# Patient Record
Sex: Female | Born: 1950
Health system: Southern US, Community
[De-identification: ages and names within clinical notes are randomized; demographics above are authoritative.]

## PROBLEM LIST (undated history)

## (undated) DIAGNOSIS — K5730 Diverticulosis of large intestine without perforation or abscess without bleeding: Secondary | ICD-10-CM

## (undated) DIAGNOSIS — B070 Plantar wart: Secondary | ICD-10-CM

## (undated) DIAGNOSIS — E785 Hyperlipidemia, unspecified: Secondary | ICD-10-CM

## (undated) DIAGNOSIS — Z9289 Personal history of other medical treatment: Secondary | ICD-10-CM

## (undated) DIAGNOSIS — R920 Mammographic microcalcification found on diagnostic imaging of breast: Secondary | ICD-10-CM

## (undated) DIAGNOSIS — G8929 Other chronic pain: Secondary | ICD-10-CM

## (undated) DIAGNOSIS — T8579XA Infection and inflammatory reaction due to other internal prosthetic devices, implants and grafts, initial encounter: Secondary | ICD-10-CM

## (undated) DIAGNOSIS — Z860100 Personal history of colon polyps, unspecified: Secondary | ICD-10-CM

## (undated) DIAGNOSIS — M545 Low back pain, unspecified: Secondary | ICD-10-CM

## (undated) DIAGNOSIS — I10 Essential (primary) hypertension: Secondary | ICD-10-CM

## (undated) DIAGNOSIS — H539 Unspecified visual disturbance: Secondary | ICD-10-CM

## (undated) DIAGNOSIS — R209 Unspecified disturbances of skin sensation: Secondary | ICD-10-CM

## (undated) DIAGNOSIS — M1990 Unspecified osteoarthritis, unspecified site: Secondary | ICD-10-CM

## (undated) DIAGNOSIS — K222 Esophageal obstruction: Secondary | ICD-10-CM

## (undated) DIAGNOSIS — K219 Gastro-esophageal reflux disease without esophagitis: Secondary | ICD-10-CM

## (undated) DIAGNOSIS — R1310 Dysphagia, unspecified: Secondary | ICD-10-CM

## (undated) DIAGNOSIS — N6019 Diffuse cystic mastopathy of unspecified breast: Secondary | ICD-10-CM

## (undated) DIAGNOSIS — C439 Malignant melanoma of skin, unspecified: Secondary | ICD-10-CM

## (undated) DIAGNOSIS — Z87440 Personal history of urinary (tract) infections: Secondary | ICD-10-CM

## (undated) DIAGNOSIS — M8580 Other specified disorders of bone density and structure, unspecified site: Secondary | ICD-10-CM

## (undated) DIAGNOSIS — T7840XA Allergy, unspecified, initial encounter: Secondary | ICD-10-CM

## (undated) DIAGNOSIS — M25512 Pain in left shoulder: Secondary | ICD-10-CM

## (undated) DIAGNOSIS — Z5189 Encounter for other specified aftercare: Secondary | ICD-10-CM

## (undated) DIAGNOSIS — H269 Unspecified cataract: Secondary | ICD-10-CM

## (undated) DIAGNOSIS — O321XX0 Maternal care for breech presentation, not applicable or unspecified: Secondary | ICD-10-CM

## (undated) HISTORY — DX: Infection and inflammatory reaction due to other internal prosthetic devices, implants and grafts, initial encounter: T85.79XA

## (undated) HISTORY — DX: Personal history of urinary (tract) infections: Z87.440

## (undated) HISTORY — DX: Personal history of other medical treatment: Z92.89

## (undated) HISTORY — DX: Personal history of colon polyps, unspecified: Z86.0100

## (undated) HISTORY — DX: Other chronic pain: G89.29

## (undated) HISTORY — DX: Essential (primary) hypertension: I10

## (undated) HISTORY — DX: Unspecified disturbances of skin sensation: R20.9

## (undated) HISTORY — DX: Mammographic microcalcification found on diagnostic imaging of breast: R92.0

## (undated) HISTORY — DX: Esophageal obstruction: K22.2

## (undated) HISTORY — DX: Diverticulosis of large intestine without perforation or abscess without bleeding: K57.30

## (undated) HISTORY — DX: Hyperlipidemia, unspecified: E78.5

## (undated) HISTORY — DX: Unspecified osteoarthritis, unspecified site: M19.90

## (undated) HISTORY — DX: Other specified disorders of bone density and structure, unspecified site: M85.80

## (undated) HISTORY — DX: Gastro-esophageal reflux disease without esophagitis: K21.9

## (undated) HISTORY — DX: Unspecified visual disturbance: H53.9

## (undated) HISTORY — DX: Low back pain, unspecified: M54.50

## (undated) HISTORY — DX: Allergy, unspecified, initial encounter: T78.40XA

## (undated) HISTORY — DX: Pain in left shoulder: M25.512

## (undated) HISTORY — DX: Dysphagia, unspecified: R13.10

## (undated) HISTORY — DX: Low back pain: M54.5

## (undated) HISTORY — DX: Plantar wart: B07.0

## (undated) HISTORY — PX: POLYPECTOMY: SHX149

## (undated) HISTORY — DX: Encounter for other specified aftercare: Z51.89

## (undated) HISTORY — PX: UPPER GASTROINTESTINAL ENDOSCOPY: SHX188

## (undated) HISTORY — DX: Diffuse cystic mastopathy of unspecified breast: N60.19

## (undated) HISTORY — DX: Personal history of colonic polyps: Z86.010

---
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
## 1993-02-09 DIAGNOSIS — I10 Essential (primary) hypertension: Secondary | ICD-10-CM

## 1993-02-09 HISTORY — DX: Essential (primary) hypertension: I10

## 1995-04-21 ENCOUNTER — Encounter: Payer: Self-pay | Admitting: Family Medicine

## 1995-04-21 LAB — CONVERTED CEMR LAB

## 1996-06-26 ENCOUNTER — Encounter: Payer: Self-pay | Admitting: Family Medicine

## 1996-06-26 LAB — CONVERTED CEMR LAB
RBC count: 4.63 10*6/uL
TSH: 0.48 microintl units/mL
WBC, blood: 7.1 10*3/uL

## 1997-05-31 ENCOUNTER — Encounter: Payer: Self-pay | Admitting: Family Medicine

## 1997-05-31 LAB — CONVERTED CEMR LAB
Blood Glucose, Fasting: 95 mg/dL
Hgb A1c MFr Bld: 6.3 %
TSH: 0.97 microintl units/mL

## 1997-09-27 ENCOUNTER — Encounter: Payer: Self-pay | Admitting: Family Medicine

## 1997-09-27 LAB — CONVERTED CEMR LAB: Hgb A1c MFr Bld: 5.8 %

## 1998-09-11 ENCOUNTER — Encounter: Payer: Self-pay | Admitting: Family Medicine

## 1998-09-11 LAB — CONVERTED CEMR LAB: Hgb A1c MFr Bld: 5.2 %

## 1998-12-17 ENCOUNTER — Encounter: Admission: RE | Admit: 1998-12-17 | Discharge: 1998-12-17 | Payer: Self-pay | Admitting: *Deleted

## 1998-12-31 ENCOUNTER — Encounter: Admission: RE | Admit: 1998-12-31 | Discharge: 1998-12-31 | Payer: Self-pay | Admitting: *Deleted

## 1999-08-20 ENCOUNTER — Encounter: Payer: Self-pay | Admitting: Family Medicine

## 1999-08-20 LAB — CONVERTED CEMR LAB: Hgb A1c MFr Bld: 6.5 %

## 1999-08-22 ENCOUNTER — Encounter: Payer: Self-pay | Admitting: Family Medicine

## 1999-12-31 ENCOUNTER — Encounter: Payer: Self-pay | Admitting: Family Medicine

## 1999-12-31 LAB — CONVERTED CEMR LAB

## 2000-01-13 ENCOUNTER — Encounter: Admission: RE | Admit: 2000-01-13 | Discharge: 2000-01-13 | Payer: Self-pay | Admitting: *Deleted

## 2000-01-16 ENCOUNTER — Encounter: Payer: Self-pay | Admitting: Family Medicine

## 2000-01-29 ENCOUNTER — Encounter: Payer: Self-pay | Admitting: Family Medicine

## 2000-10-12 ENCOUNTER — Encounter: Payer: Self-pay | Admitting: Family Medicine

## 2000-12-31 ENCOUNTER — Encounter: Payer: Self-pay | Admitting: Family Medicine

## 2000-12-31 LAB — CONVERTED CEMR LAB
Blood Glucose, Fasting: 111 mg/dL
TSH: 0.81 microintl units/mL

## 2001-01-24 ENCOUNTER — Encounter: Admission: RE | Admit: 2001-01-24 | Discharge: 2001-01-24 | Payer: Self-pay | Admitting: *Deleted

## 2001-03-30 ENCOUNTER — Encounter: Payer: Self-pay | Admitting: Family Medicine

## 2001-03-30 LAB — CONVERTED CEMR LAB: Hgb A1c MFr Bld: 5.4 %

## 2001-08-19 ENCOUNTER — Encounter: Payer: Self-pay | Admitting: Family Medicine

## 2001-08-19 LAB — CONVERTED CEMR LAB: Hgb A1c MFr Bld: 5.5 %

## 2001-08-22 ENCOUNTER — Encounter: Payer: Self-pay | Admitting: Family Medicine

## 2001-08-22 LAB — CONVERTED CEMR LAB: Blood Glucose, Fasting: 99 mg/dL

## 2002-01-20 ENCOUNTER — Encounter: Payer: Self-pay | Admitting: Family Medicine

## 2002-01-20 LAB — CONVERTED CEMR LAB

## 2002-03-20 ENCOUNTER — Encounter: Payer: Self-pay | Admitting: Family Medicine

## 2002-08-29 ENCOUNTER — Encounter: Payer: Self-pay | Admitting: Family Medicine

## 2002-08-30 ENCOUNTER — Encounter: Payer: Self-pay | Admitting: Family Medicine

## 2002-08-30 LAB — CONVERTED CEMR LAB: Blood Glucose, Fasting: 116 mg/dL

## 2003-04-10 DIAGNOSIS — Z9289 Personal history of other medical treatment: Secondary | ICD-10-CM

## 2003-04-10 HISTORY — DX: Personal history of other medical treatment: Z92.89

## 2003-10-16 ENCOUNTER — Encounter: Payer: Self-pay | Admitting: Family Medicine

## 2003-10-16 LAB — CONVERTED CEMR LAB
Blood Glucose, Fasting: 120 mg/dL
TSH: 0.71 microintl units/mL

## 2004-04-11 ENCOUNTER — Ambulatory Visit: Payer: Self-pay | Admitting: Family Medicine

## 2004-04-11 LAB — CONVERTED CEMR LAB: Microalbumin U total vol: 1.7 mg/L

## 2004-04-16 ENCOUNTER — Ambulatory Visit: Payer: Self-pay | Admitting: Family Medicine

## 2004-05-30 ENCOUNTER — Ambulatory Visit: Payer: Self-pay | Admitting: Unknown Physician Specialty

## 2004-10-07 ENCOUNTER — Ambulatory Visit: Payer: Self-pay | Admitting: Family Medicine

## 2004-10-14 ENCOUNTER — Ambulatory Visit: Payer: Self-pay | Admitting: Family Medicine

## 2004-10-22 ENCOUNTER — Ambulatory Visit: Payer: Self-pay | Admitting: Family Medicine

## 2004-10-22 LAB — CONVERTED CEMR LAB
Blood Glucose, Fasting: 115 mg/dL
Hgb A1c MFr Bld: 5.6 %
Microalbumin U total vol: 3.2 mg/L
TSH: 1.16 u[IU]/mL

## 2004-10-29 ENCOUNTER — Ambulatory Visit: Payer: Self-pay | Admitting: Family Medicine

## 2004-12-10 ENCOUNTER — Ambulatory Visit: Payer: Self-pay | Admitting: Family Medicine

## 2004-12-23 ENCOUNTER — Ambulatory Visit: Payer: Self-pay | Admitting: Family Medicine

## 2005-01-23 ENCOUNTER — Ambulatory Visit: Payer: Self-pay | Admitting: Family Medicine

## 2005-01-27 ENCOUNTER — Ambulatory Visit: Payer: Self-pay | Admitting: Family Medicine

## 2005-05-27 ENCOUNTER — Ambulatory Visit: Payer: Self-pay | Admitting: Family Medicine

## 2005-05-27 LAB — CONVERTED CEMR LAB: Hgb A1c MFr Bld: 6 %

## 2005-05-29 ENCOUNTER — Ambulatory Visit: Payer: Self-pay | Admitting: Family Medicine

## 2005-06-01 ENCOUNTER — Ambulatory Visit: Payer: Self-pay | Admitting: Unknown Physician Specialty

## 2005-11-24 ENCOUNTER — Ambulatory Visit: Payer: Self-pay | Admitting: Family Medicine

## 2005-11-24 LAB — CONVERTED CEMR LAB
Blood Glucose, Fasting: 110 mg/dL
Microalbumin U total vol: 8.2 mg/L

## 2005-11-26 ENCOUNTER — Ambulatory Visit: Payer: Self-pay | Admitting: Family Medicine

## 2005-12-21 ENCOUNTER — Ambulatory Visit: Payer: Self-pay | Admitting: Family Medicine

## 2005-12-25 ENCOUNTER — Ambulatory Visit: Payer: Self-pay | Admitting: Family Medicine

## 2006-06-01 ENCOUNTER — Encounter: Payer: Self-pay | Admitting: Family Medicine

## 2006-06-01 ENCOUNTER — Encounter: Payer: Self-pay | Admitting: Gastroenterology

## 2006-06-01 ENCOUNTER — Ambulatory Visit: Payer: Self-pay | Admitting: Gastroenterology

## 2006-06-03 ENCOUNTER — Ambulatory Visit: Payer: Self-pay | Admitting: Unknown Physician Specialty

## 2006-06-05 ENCOUNTER — Encounter: Payer: Self-pay | Admitting: Family Medicine

## 2006-10-26 ENCOUNTER — Encounter: Payer: Self-pay | Admitting: Family Medicine

## 2006-10-26 ENCOUNTER — Ambulatory Visit: Payer: Self-pay | Admitting: Family Medicine

## 2006-10-26 DIAGNOSIS — M25519 Pain in unspecified shoulder: Secondary | ICD-10-CM | POA: Insufficient documentation

## 2006-10-27 ENCOUNTER — Telehealth: Payer: Self-pay | Admitting: Family Medicine

## 2006-10-28 ENCOUNTER — Telehealth: Payer: Self-pay | Admitting: Family Medicine

## 2007-03-14 ENCOUNTER — Encounter: Payer: Self-pay | Admitting: Family Medicine

## 2007-05-02 ENCOUNTER — Ambulatory Visit: Payer: Self-pay | Admitting: Family Medicine

## 2007-05-02 LAB — CONVERTED CEMR LAB
ALT: 21 units/L (ref 0–35)
AST: 18 units/L (ref 0–37)
Albumin: 3.8 g/dL (ref 3.5–5.2)
Alkaline Phosphatase: 57 units/L (ref 39–117)
BUN: 11 mg/dL (ref 6–23)
Bilirubin, Direct: 0.1 mg/dL (ref 0.0–0.3)
CO2: 31 meq/L (ref 19–32)
Calcium: 9.4 mg/dL (ref 8.4–10.5)
Chloride: 104 meq/L (ref 96–112)
Cholesterol: 204 mg/dL (ref 0–200)
Creatinine, Ser: 0.6 mg/dL (ref 0.4–1.2)
Creatinine,U: 96 mg/dL
Direct LDL: 119.9 mg/dL
GFR calc Af Amer: 133 mL/min
GFR calc non Af Amer: 110 mL/min
Glucose, Bld: 108 mg/dL — ABNORMAL HIGH (ref 70–99)
HDL: 34.7 mg/dL — ABNORMAL LOW (ref >39.0)
Hgb A1c MFr Bld: 6.2 % — ABNORMAL HIGH (ref 4.6–6.0)
Microalb Creat Ratio: 16.7 mg/g (ref 0.0–30.0)
Microalb, Ur: 1.6 mg/dL (ref 0.0–1.9)
Potassium: 3.6 meq/L (ref 3.5–5.1)
Sodium: 143 meq/L (ref 135–145)
TSH: 0.85 microintl units/mL (ref 0.35–5.50)
Total Bilirubin: 0.6 mg/dL (ref 0.3–1.2)
Total CHOL/HDL Ratio: 5.9
Total Protein: 6.5 g/dL (ref 6.0–8.3)
Triglycerides: 305 mg/dL (ref 0–149)
VLDL: 61 mg/dL — ABNORMAL HIGH (ref 0–40)

## 2007-05-05 ENCOUNTER — Ambulatory Visit: Payer: Self-pay | Admitting: Family Medicine

## 2007-05-05 DIAGNOSIS — G8929 Other chronic pain: Secondary | ICD-10-CM | POA: Insufficient documentation

## 2007-05-05 DIAGNOSIS — M545 Low back pain, unspecified: Secondary | ICD-10-CM | POA: Insufficient documentation

## 2007-05-05 DIAGNOSIS — K5730 Diverticulosis of large intestine without perforation or abscess without bleeding: Secondary | ICD-10-CM | POA: Insufficient documentation

## 2007-06-07 ENCOUNTER — Ambulatory Visit: Payer: Self-pay | Admitting: Unknown Physician Specialty

## 2007-06-08 ENCOUNTER — Encounter (INDEPENDENT_AMBULATORY_CARE_PROVIDER_SITE_OTHER): Payer: Self-pay | Admitting: *Deleted

## 2007-06-08 ENCOUNTER — Ambulatory Visit: Payer: Self-pay | Admitting: Family Medicine

## 2007-06-08 LAB — CONVERTED CEMR LAB
OCCULT 1: NEGATIVE
OCCULT 2: NEGATIVE
OCCULT 3: NEGATIVE

## 2007-06-08 LAB — FECAL OCCULT BLOOD, GUAIAC: Fecal Occult Blood: NEGATIVE

## 2007-06-13 ENCOUNTER — Encounter: Payer: Self-pay | Admitting: Family Medicine

## 2007-06-13 ENCOUNTER — Ambulatory Visit: Payer: Self-pay | Admitting: Unknown Physician Specialty

## 2007-06-13 IMAGING — US ULTRASOUND RIGHT BREAST
1 series · 10 of 10 positions shown · non-contrast
Comparison: none

REASON FOR EXAM: Right breast nodular density
COMMENTS:

[Series 1: ultrasound right breast · 10 of 10 slices shown]
[im 1/10]
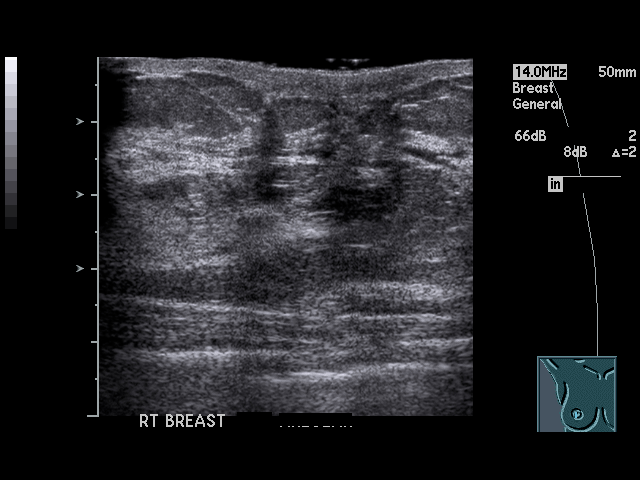
[im 2/10]
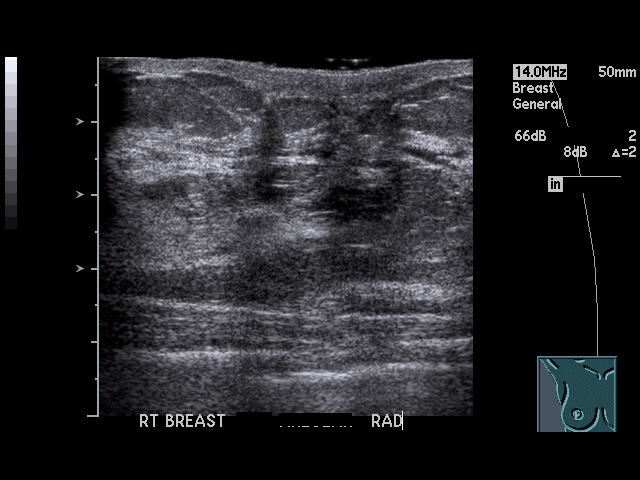
[im 3/10]
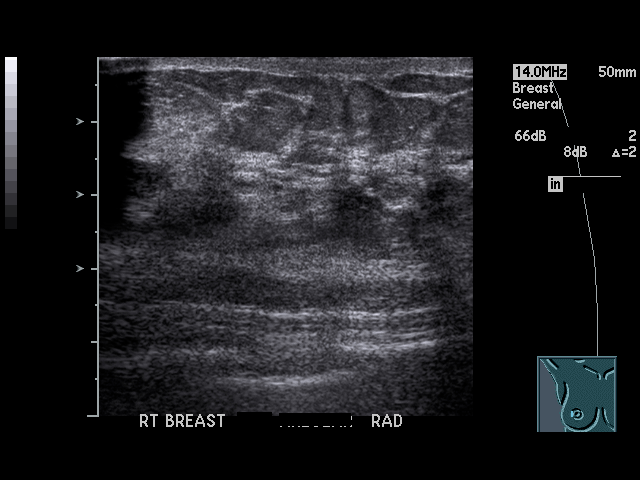
[im 4/10]
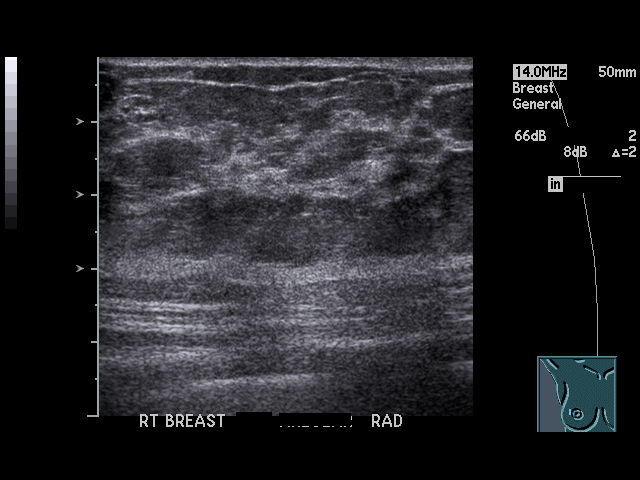
[im 5/10]
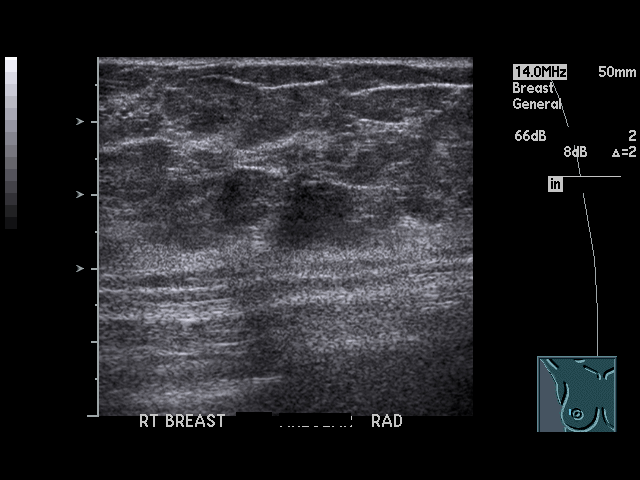
[im 6/10]
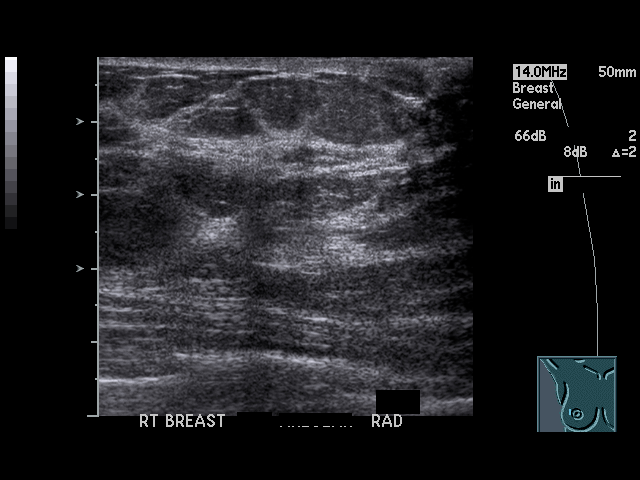
[im 7/10]
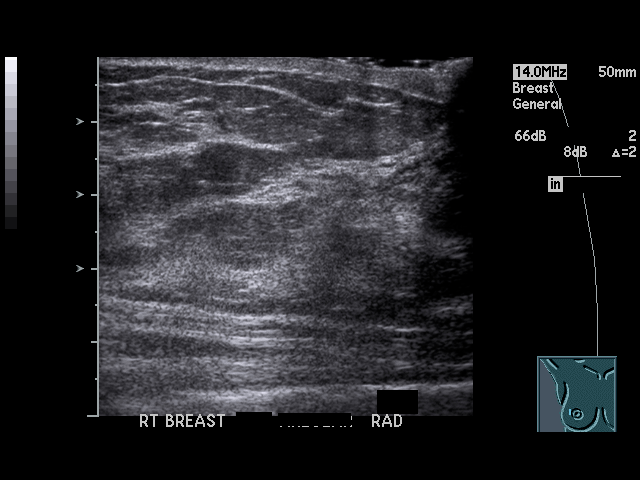
[im 8/10]
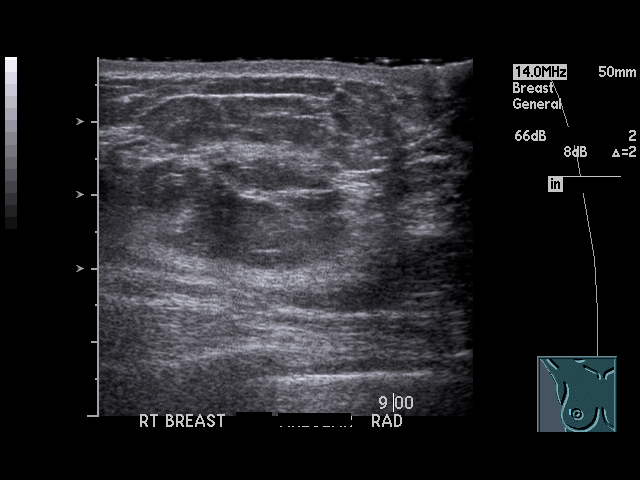
[im 9/10]
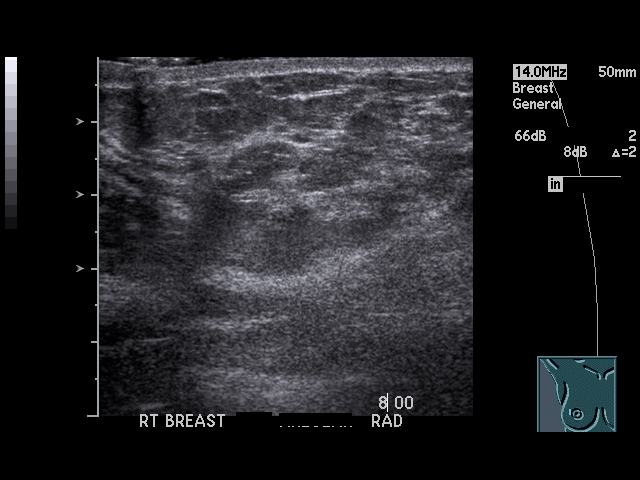
[im 10/10]
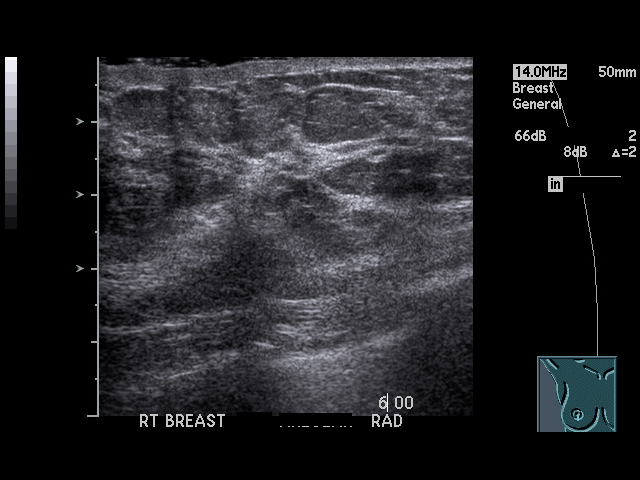

[10 of 10 positions shown; findings below may reference images not displayed]

PROCEDURE:     US  - US BREAST RIGHT  - [DATE] [DATE]

RESULT:     The RIGHT breast was evaluated in the region of interest along
the periareolar region. The breast was evaluated from the 12 o'clock to the
6 o'clock position in the periareolar region. No solid or cystic sonographic
abnormalities are appreciated.
IMPRESSION: No sonographic abnormalities are appreciated. Please refer
to the additional radiographic view dictation for completed discussion.

## 2007-10-19 ENCOUNTER — Ambulatory Visit: Payer: Self-pay | Admitting: Family Medicine

## 2007-10-19 LAB — CONVERTED CEMR LAB: Hgb A1c MFr Bld: 6.1 % — ABNORMAL HIGH (ref 4.6–6.0)

## 2007-10-25 ENCOUNTER — Ambulatory Visit: Payer: Self-pay | Admitting: Family Medicine

## 2007-10-26 ENCOUNTER — Encounter: Payer: Self-pay | Admitting: Family Medicine

## 2007-10-27 DIAGNOSIS — I10 Essential (primary) hypertension: Secondary | ICD-10-CM | POA: Insufficient documentation

## 2007-10-27 DIAGNOSIS — IMO0002 Reserved for concepts with insufficient information to code with codable children: Secondary | ICD-10-CM | POA: Insufficient documentation

## 2007-10-27 DIAGNOSIS — E1169 Type 2 diabetes mellitus with other specified complication: Secondary | ICD-10-CM | POA: Insufficient documentation

## 2007-10-27 DIAGNOSIS — E1165 Type 2 diabetes mellitus with hyperglycemia: Secondary | ICD-10-CM

## 2008-02-10 HISTORY — PX: COLONOSCOPY: SHX174

## 2008-02-10 HISTORY — PX: ESOPHAGOGASTRODUODENOSCOPY: SHX1529

## 2008-03-15 ENCOUNTER — Encounter: Payer: Self-pay | Admitting: Family Medicine

## 2008-05-02 ENCOUNTER — Ambulatory Visit: Payer: Self-pay | Admitting: Family Medicine

## 2008-05-02 LAB — CONVERTED CEMR LAB
Albumin: 3.9 g/dL (ref 3.5–5.2)
Bilirubin, Direct: 0 mg/dL (ref 0.0–0.3)
CO2: 33 meq/L — ABNORMAL HIGH (ref 19–32)
Calcium: 9.2 mg/dL (ref 8.4–10.5)
Creatinine, Ser: 0.8 mg/dL (ref 0.4–1.2)
Direct LDL: 108.1 mg/dL
GFR calc non Af Amer: 78.22 mL/min (ref >60)
Glucose, Bld: 119 mg/dL — ABNORMAL HIGH (ref 70–99)
HDL: 37.4 mg/dL — ABNORMAL LOW (ref >39.00)
Hgb A1c MFr Bld: 6.3 % (ref 4.6–6.5)
Total Protein: 6.7 g/dL (ref 6.0–8.3)
Triglycerides: 186 mg/dL — ABNORMAL HIGH (ref 0.0–149.0)

## 2008-05-09 ENCOUNTER — Ambulatory Visit: Payer: Self-pay | Admitting: Family Medicine

## 2008-05-09 DIAGNOSIS — R1310 Dysphagia, unspecified: Secondary | ICD-10-CM | POA: Insufficient documentation

## 2008-05-23 ENCOUNTER — Ambulatory Visit: Payer: Self-pay | Admitting: Family Medicine

## 2008-05-23 LAB — CONVERTED CEMR LAB
OCCULT 1: POSITIVE
OCCULT 2: POSITIVE

## 2008-06-08 ENCOUNTER — Ambulatory Visit: Payer: Self-pay | Admitting: Family Medicine

## 2008-06-20 ENCOUNTER — Ambulatory Visit: Payer: Self-pay | Admitting: Unknown Physician Specialty

## 2008-06-20 ENCOUNTER — Encounter: Payer: Self-pay | Admitting: Family Medicine

## 2008-07-10 ENCOUNTER — Ambulatory Visit: Payer: Self-pay | Admitting: Gastroenterology

## 2008-07-10 DIAGNOSIS — K6289 Other specified diseases of anus and rectum: Secondary | ICD-10-CM | POA: Insufficient documentation

## 2008-07-12 ENCOUNTER — Telehealth: Payer: Self-pay | Admitting: Gastroenterology

## 2008-07-23 ENCOUNTER — Ambulatory Visit: Payer: Self-pay | Admitting: Gastroenterology

## 2008-07-23 ENCOUNTER — Encounter: Payer: Self-pay | Admitting: Gastroenterology

## 2008-07-23 DIAGNOSIS — K222 Esophageal obstruction: Secondary | ICD-10-CM

## 2008-07-23 HISTORY — DX: Esophageal obstruction: K22.2

## 2008-07-25 ENCOUNTER — Encounter: Payer: Self-pay | Admitting: Gastroenterology

## 2008-07-30 ENCOUNTER — Telehealth: Payer: Self-pay | Admitting: Gastroenterology

## 2008-08-06 ENCOUNTER — Ambulatory Visit: Payer: Self-pay | Admitting: Family Medicine

## 2008-08-06 LAB — CONVERTED CEMR LAB
BUN: 14 mg/dL (ref 6–23)
CO2: 31 meq/L (ref 19–32)
Chloride: 106 meq/L (ref 96–112)
Creatinine, Ser: 0.6 mg/dL (ref 0.4–1.2)
Glucose, Bld: 119 mg/dL — ABNORMAL HIGH (ref 70–99)

## 2008-08-09 ENCOUNTER — Ambulatory Visit: Payer: Self-pay | Admitting: Family Medicine

## 2008-08-09 DIAGNOSIS — Z860100 Personal history of colon polyps, unspecified: Secondary | ICD-10-CM | POA: Insufficient documentation

## 2008-08-09 DIAGNOSIS — K222 Esophageal obstruction: Secondary | ICD-10-CM | POA: Insufficient documentation

## 2008-08-14 ENCOUNTER — Ambulatory Visit: Payer: Self-pay | Admitting: Gastroenterology

## 2008-08-15 ENCOUNTER — Telehealth: Payer: Self-pay | Admitting: Gastroenterology

## 2008-09-25 ENCOUNTER — Ambulatory Visit: Payer: Self-pay | Admitting: Family Medicine

## 2008-10-04 ENCOUNTER — Ambulatory Visit: Payer: Self-pay | Admitting: Family Medicine

## 2008-11-01 ENCOUNTER — Encounter: Payer: Self-pay | Admitting: Family Medicine

## 2008-11-05 ENCOUNTER — Telehealth: Payer: Self-pay | Admitting: Family Medicine

## 2008-11-09 ENCOUNTER — Ambulatory Visit: Payer: Self-pay | Admitting: Family Medicine

## 2008-11-11 LAB — CONVERTED CEMR LAB
ALT: 23 units/L (ref 0–35)
HDL: 35.6 mg/dL — ABNORMAL LOW (ref >39.00)
Total CHOL/HDL Ratio: 4
VLDL: 29 mg/dL (ref 0.0–40.0)

## 2008-11-22 ENCOUNTER — Ambulatory Visit: Payer: Self-pay | Admitting: Family Medicine

## 2008-11-22 DIAGNOSIS — F329 Major depressive disorder, single episode, unspecified: Secondary | ICD-10-CM | POA: Insufficient documentation

## 2008-11-22 HISTORY — DX: Major depressive disorder, single episode, unspecified: F32.9

## 2008-11-30 ENCOUNTER — Ambulatory Visit: Payer: Self-pay | Admitting: Family Medicine

## 2008-12-31 ENCOUNTER — Ambulatory Visit: Payer: Self-pay | Admitting: Family Medicine

## 2009-02-09 DIAGNOSIS — Z87440 Personal history of urinary (tract) infections: Secondary | ICD-10-CM

## 2009-02-09 HISTORY — DX: Personal history of urinary (tract) infections: Z87.440

## 2009-04-04 ENCOUNTER — Encounter: Payer: Self-pay | Admitting: Family Medicine

## 2009-04-05 ENCOUNTER — Encounter: Payer: Self-pay | Admitting: Family Medicine

## 2009-04-05 LAB — HM DIABETES EYE EXAM: HM Diabetic Eye Exam: NORMAL

## 2009-05-09 ENCOUNTER — Ambulatory Visit: Payer: Self-pay | Admitting: Family Medicine

## 2009-05-09 LAB — CONVERTED CEMR LAB
ALT: 19 units/L (ref 0–35)
AST: 15 units/L (ref 0–37)
Albumin: 3.9 g/dL (ref 3.5–5.2)
Alkaline Phosphatase: 56 units/L (ref 39–117)
Basophils Relative: 0 % (ref 0.0–3.0)
CO2: 31 meq/L (ref 19–32)
Calcium: 9.1 mg/dL (ref 8.4–10.5)
Chloride: 105 meq/L (ref 96–112)
Eosinophils Absolute: 0.2 10*3/uL (ref 0.0–0.7)
Eosinophils Relative: 3.7 % (ref 0.0–5.0)
HDL: 40.4 mg/dL (ref >39.00)
Hemoglobin: 12.7 g/dL (ref 12.0–15.0)
Lymphocytes Relative: 29.6 % (ref 12.0–46.0)
MCHC: 34.5 g/dL (ref 30.0–36.0)
Monocytes Relative: 12.5 % — ABNORMAL HIGH (ref 3.0–12.0)
Neutro Abs: 2.7 10*3/uL (ref 1.4–7.7)
RBC: 4.28 M/uL (ref 3.87–5.11)
Sodium: 143 meq/L (ref 135–145)
Total CHOL/HDL Ratio: 4
Total Protein: 7.1 g/dL (ref 6.0–8.3)

## 2009-05-14 ENCOUNTER — Ambulatory Visit: Payer: Self-pay | Admitting: Family Medicine

## 2009-07-03 ENCOUNTER — Ambulatory Visit: Payer: Self-pay | Admitting: Unknown Physician Specialty

## 2009-07-03 LAB — HM MAMMOGRAPHY: HM Mammogram: NORMAL

## 2009-07-05 ENCOUNTER — Encounter: Payer: Self-pay | Admitting: Family Medicine

## 2009-07-23 ENCOUNTER — Ambulatory Visit: Payer: Self-pay | Admitting: Family Medicine

## 2009-09-03 ENCOUNTER — Encounter: Payer: Self-pay | Admitting: Family Medicine

## 2009-09-06 ENCOUNTER — Ambulatory Visit: Payer: Self-pay | Admitting: Family Medicine

## 2009-09-06 DIAGNOSIS — H6980 Other specified disorders of Eustachian tube, unspecified ear: Secondary | ICD-10-CM | POA: Insufficient documentation

## 2009-09-17 ENCOUNTER — Encounter (INDEPENDENT_AMBULATORY_CARE_PROVIDER_SITE_OTHER): Payer: Self-pay | Admitting: *Deleted

## 2009-11-29 ENCOUNTER — Ambulatory Visit: Payer: Self-pay | Admitting: Family Medicine

## 2009-12-01 LAB — CONVERTED CEMR LAB
ALT: 19 units/L (ref 0–35)
AST: 16 units/L (ref 0–37)
BUN: 14 mg/dL (ref 6–23)
Basophils Absolute: 0.1 10*3/uL (ref 0.0–0.1)
Bilirubin, Direct: 0.1 mg/dL (ref 0.0–0.3)
Cholesterol: 179 mg/dL (ref 0–200)
Creatinine, Ser: 0.7 mg/dL (ref 0.4–1.2)
Creatinine,U: 68 mg/dL
Eosinophils Relative: 3.6 % (ref 0.0–5.0)
GFR calc non Af Amer: 97.13 mL/min (ref >60)
Glucose, Bld: 126 mg/dL — ABNORMAL HIGH (ref 70–99)
HDL: 38.9 mg/dL — ABNORMAL LOW (ref >39.00)
Hgb A1c MFr Bld: 6.5 % (ref 4.6–6.5)
LDL Cholesterol: 112 mg/dL — ABNORMAL HIGH (ref 0–99)
Microalb Creat Ratio: 2.1 mg/g (ref 0.0–30.0)
Monocytes Absolute: 0.5 10*3/uL (ref 0.1–1.0)
Monocytes Relative: 8.3 % (ref 3.0–12.0)
Neutrophils Relative %: 57.2 % (ref 43.0–77.0)
Platelets: 160 10*3/uL (ref 150.0–400.0)
RDW: 13.4 % (ref 11.5–14.6)
TSH: 0.51 microintl units/mL (ref 0.35–5.50)
Total Bilirubin: 0.7 mg/dL (ref 0.3–1.2)
Triglycerides: 140 mg/dL (ref 0.0–149.0)
VLDL: 28 mg/dL (ref 0.0–40.0)
WBC: 5.5 10*3/uL (ref 4.5–10.5)

## 2009-12-04 ENCOUNTER — Ambulatory Visit: Payer: Self-pay | Admitting: Family Medicine

## 2010-01-09 ENCOUNTER — Encounter: Payer: Self-pay | Admitting: Family Medicine

## 2010-02-09 HISTORY — PX: MELANOMA EXCISION: SHX5266

## 2010-02-09 HISTORY — PX: EYE SURGERY: SHX253

## 2010-03-05 ENCOUNTER — Ambulatory Visit
Admission: RE | Admit: 2010-03-05 | Discharge: 2010-03-05 | Payer: Self-pay | Source: Home / Self Care | Attending: Family Medicine | Admitting: Family Medicine

## 2010-03-13 NOTE — Assessment & Plan Note (Signed)
Summary: CPX / LFW   Vital Signs:  Patient profile:   60 year old female Weight:      178.75 pounds Temp:     98.3 degrees F oral Pulse rate:   72 / minute Pulse rhythm:   regular BP sitting:   140 / 90  (left arm) Cuff size:   regular  Vitals Entered By: Sydell Axon LPN (May 14, 1608 1:49 PM) CC: 30 Minute checkup, had a colonoscopy 06/10 by Dr. Jarold Motto, sees Dr. Harold Hedge for her GYN care   History of Present Illness: Pt here for Comp Exam. She saw Dr Nile Riggs last Fall and did ok.  On Mar 22 had abd tenderness and soreness, no lose stools or congestion or fever. She has history of diverticulosis. This was probably a viral syndrome.   Preventive Screening-Counseling & Management  Alcohol-Tobacco     Alcohol drinks/day: 0     Alcohol type: wine occas     Smoking Status: never     Passive Smoke Exposure: no  Caffeine-Diet-Exercise     Caffeine use/day:   occas diet coke and tea     Does Patient Exercise: yes     Type of exercise: treadmill and abd crunches, shoulder exs.     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  (ICD-692.6) 3)  Depression  (ICD-311) 4)  Sprain and Strain of R Jaw  (ICD-848.1) 5)  Sprain and Strain of Right Little Finger Dip Extensor Tendon  (ICD-842.10) 6)  Lipoma  (ICD-214.9) 7)  Schatzki's Ring  (ICD-530.3) 8)  Colonic Polyps, Hyperplastic, Hx of  (ICD-V12.72) 9)  Rectal Bleeding From Tear Via Colonosc (DR PATERSON)  (ICD-569.3) 10)  Rectal Pain  (ICD-569.42) 11)  Hemoccult Positive Stool  (ICD-578.1) 12)  Dysphagia Unspecified  (ICD-787.20) 13)  Special Screening Malig Neoplasms Other Sites  (ICD-V76.49) 14)  Diverticulosis of Colon  (ICD-562.10) 15)  Health Maintenance Exam  (ICD-V70.0) 16)  Low Back Pain, Chronic  (ICD-724.2) 17)  Diabetes Mellitus  (ICD-250.00) 18)  Hyperlipidemia/ Hypertrigliceridemia   (ICD-272.4) 19)  Essential Hypertension, Benign  (ICD-401.1) 20)  Symptom, Disturbance of Skin Sensation  (ICD-782.0) 21)  Shoulder Pain, Left  (ICD-719.41)  Medications Prior to Update: 1)  Enalapril Maleate 20 Mg Tabs (Enalapril Maleate) .Marland Kitchen.. 1 By Mouth Two Times A Day 2)  Metformin Hcl 1000 Mg  Tabs (Metformin Hcl) .... 1/2 By Mouth Every Morning 1 By Mouth Each Evening 3)  Glimepiride 4 Mg  Tabs (Glimepiride) .... 1/2 Tab By Mouth Once Daily 4)  Cyclobenzaprine Hcl 10 Mg  Tabs (Cyclobenzaprine Hcl) .... As Needed For Muscle Pain 1 By Mouth Every 6 Hours 5)  Macrodantin 100 Mg Caps (Nitrofurantoin Macrocrystal) .Marland Kitchen.. 1 Tablet After Sex 6)  Hydrochlorothiazide 12.5 Mg Tabs (Hydrochlorothiazide) .Marland Kitchen.. 1 Tab By Mouth Every Am 7)  Amlodipine Besylate 5 Mg Tabs (Amlodipine Besylate) .... One Tab By Mouth At Night 8)  Anamantle Hc 3-0.5 % Kit (Lidocaine-Hydrocortisone Ace) .... Use Rectally Two Times A Day As Needed 9)  Pravachol 20 Mg Tabs (Pravastatin Sodium) .... One Tab By Mouth At Night 10)  Fish Oil 1000 Mg Caps (Omega-3 Fatty Acids) .Marland Kitchen.. 1-2 Tablets By Mouth Once Daily 11)  Kapidex 60 Mg  Cpdr (Dexlansoprazole) .... Take One By Mouth As Needed 12)  Fluoxetine Hcl (Pmdd) 10 Mg Caps (Fluoxetine Hcl (Pmdd)) .... One A  Day 13)  Lidex 0.05 % Crea (Fluocinonide) .... Apply To Area Three Times A Day As Needed  Allergies: No Known Drug Allergies  Past History:  Past Medical History: Last updated: 08/09/2008 Current Problems:  LIPOMA (ICD-214.9) SCHATZKI'S RING (ICD-530.3) COLONIC POLYPS, HYPERPLASTIC, HX OF (ICD-V12.72) RECTAL BLEEDING (ICD-569.3) RECTAL PAIN (ICD-569.42) HEMOCCULT POSITIVE STOOL (ICD-578.1) PLANTAR WART, R LITTLE TOE (ICD-078.12) DYSPHAGIA UNSPECIFIED (ICD-787.20) SPECIAL SCREENING MALIG NEOPLASMS OTHER SITES (ICD-V76.49) DIVERTICULOSIS OF COLON (ICD-562.10) HEALTH MAINTENANCE EXAM (ICD-V70.0) LOW BACK PAIN, CHRONIC (ICD-724.2) DIABETES MELLITUS  (ICD-250.00) OTHER AND UNSPECIFIED HYPERLIPIDEMIA (ICD-272.4) ESSENTIAL HYPERTENSION, BENIGN (ICD-401.1) SYMPTOM, DISTURBANCE OF SKIN SENSATION (ICD-782.0) SHOULDER PAIN, LEFT (ICD-719.41)  Past Surgical History: Last updated: 07/27/2008 C Section Breech Presentation 1982 HYSTOSONOGRAPH (DR. Lowella Bandy)- NORMAL :(01/2000) PELVIC ULTRASOUND    Uterus  13 X 7 X8cm  FIBROID 2.6cm :(04/2003) Colonoscopy polyps x 2  Divertics 06/01/2006 EGD Shatzki's Ring, dilated o/w nml (Dr Jarold Motto) 07/23/08 COlonoscopy Rectal Polyp Lipoma in Tr Colon o/w nml (Dr Jarold Motto) 07/23/08  Family History: Last updated: 05/14/2009 Father dec 70 DM Complcns:(KIDNEY FAILURE (DIAYLSIS) Mother A 81 DM Chol Htn: PERSONALITY DISORDER Brother A 48  DM HTN Hospitalized freq CAD, PTCA ; GOUT CV: + CAD GM HBP: +BROTHER; + MOTHER; + FATHER DM: + MOTHER; + FATHER; + SELF; + BROTHER, grandparents GOUT/ ARTHRITIS: + BROTHER PROSTATE CANCER: NEGATIVE OVARIAN /UTERINE CANCER: NEGATIVE COLON CANCER: NEGATIVE DEPRESSION:  ETOH/DRUG ABUSE NEGATIVE OTHER : pOSITIVE STROKE ; GM X2 :GF :  Family History of Breast Cancer: Aunts Family History of Kidney Disease: Father  Social History: Last updated: 07/10/2008 Occupation:[employer] Retired Married  1 Son Patient has never smoked.  Alcohol Use - yes-very occasional Daily Caffeine Use-3 drinks daily Illicit Drug Use - no Patient does not get regular exercise.   Risk Factors: Alcohol Use: 0 (05/14/2009) Caffeine Use:   occas diet coke and tea (05/14/2009) Exercise: yes (05/14/2009)  Risk Factors: Smoking Status: never (05/14/2009) Passive Smoke Exposure: no (05/14/2009)  Family History: Father dec 70 DM Complcns:(KIDNEY FAILURE (DIAYLSIS) Mother A 81 DM Chol Htn: PERSONALITY DISORDER Brother A 48  DM HTN Hospitalized freq CAD, PTCA ; GOUT CV: + CAD GM HBP: +BROTHER; + MOTHER; + FATHER DM: + MOTHER; + FATHER; + SELF; + BROTHER, grandparents GOUT/ ARTHRITIS: +  BROTHER PROSTATE CANCER: NEGATIVE OVARIAN /UTERINE CANCER: NEGATIVE COLON CANCER: NEGATIVE DEPRESSION:  ETOH/DRUG ABUSE NEGATIVE OTHER : pOSITIVE STROKE ; GM X2 :GF :  Family History of Breast Cancer: Aunts Family History of Kidney Disease: Father  Social History: Caffeine use/day:    occas diet coke and tea Does Patient Exercise:  yes  Review of Systems General:  Denies chills, fatigue, fever, sweats, weakness, and weight loss; occas night sweats. Eyes:  Denies blurring, discharge, and eye pain. ENT:  Complains of ringing in ears; denies decreased hearing and earache. CV:  Complains of palpitations; denies chest pain or discomfort, fainting, shortness of breath with exertion, swelling of feet, and swelling of hands; rare. Resp:  Denies cough, shortness of breath, and wheezing. GI:  Complains of constipation; denies diarrhea, excessive appetite, and gas; occas. GU:  Denies dysuria, nocturia, urinary frequency, and urinary hesitancy; Sensitive to glycerin. MS:  Complains of joint pain; denies low back pain, muscle aches, cramps, muscle weakness, and stiffness; left shoulder tightness. Derm:  Complains of dryness; denies itching and rash; on winter. Neuro:  Denies numbness, poor balance, tingling, and tremors.  Physical Exam  General:  Well-developed,well-nourished,in no acute distress; alert,appropriate and cooperative throughout examination Head:  Normocephalic  and atraumatic without obvious abnormalities. No apparent alopecia or balding. Sinuses NT. Eyes:  Conjunctiva clear bilaterally.  Ears:  External ear exam shows no significant lesions or deformities.  Otoscopic examination reveals clear canals, tympanic membranes are intact bilaterally without bulging, retraction, inflammation or discharge. Hearing is grossly normal bilaterally. Nose:  External nasal examination shows no deformity or inflammation. Nasal mucosa are pink and moist without lesions or exudates. Mouth:  Oral  mucosa and oropharynx without lesions or exudates.  Teeth in good repair. Neck:  No deformities, masses, or tenderness noted. Chest Wall:  No deformities, masses, or tenderness noted. Breasts:  Deferred to Dr Alfredo Batty Lungs:  Normal respiratory effort, chest expands symmetrically. Lungs are clear to auscultation, no crackles or wheezes. Heart:  Normal rate and regular rhythm. S1 and S2 normal without gallop, murmur, click, rub or other extra sounds. Abdomen:  Bowel sounds positive,abdomen soft and non-tender without masses, organomegaly or hernias noted. Rectal:  Deferred. Genitalia:  Deferred to Dr Alfredo Batty. Msk:  No deformity or scoliosis noted of thoracic or lumbar spine.  R shoulder slightly restricted ROM. Pulses:  R and L carotid,radial,femoral,dorsalis pedis and posterior tibial pulses are full and equal bilaterally Extremities:  No clubbing, cyanosis, edema, or deformity noted with normal full range of motion of all joints.   Neurologic:  No cranial nerve deficits noted. Station and gait are normal. Sensory, motor and coordinative functions appear intact. Skin:  Intact without suspicious lesions or rashes Cervical Nodes:  No lymphadenopathy noted Inguinal Nodes:  No significant adenopathy Psych:  Cognition and judgment appear intact. Alert and cooperative with normal attention span and concentration. No apparent delusions, illusions, hallucinations. Tearful with some mood lability.   Impression & Recommendations:  Problem # 1:  HEALTH MAINTENANCE EXAM (ICD-V70.0) Assessment Comment Only Discussed immun status, Gyn care and mammo recheck in May.  Discussed diet and exercise.  Problem # 2:  DEPRESSION (ICD-311) Assessment: Improved Stable. Does not feel she needs further meds and I agree. She is back to  her old self.  Problem # 3:  SPRAIN AND STRAIN OF RIGHT LITTLE FINGER DIP EXTENSOR TENDON (ICD-842.10) Assessment: Unchanged As good as it is going to get. Unable total  extemnsion but close.  Problem # 4:  SCHATZKI'S RING (ICD-530.3) Assessment: Unchanged Rarely has slow swallowing. Overall doing well.  Problem # 5:  RECTAL BLEEDING FROM TEAR VIA COLONOSC (DR PATERSON) (ICD-569.3) Assessment: Improved None recently.  Problem # 6:  DIVERTICULOSIS OF COLON (ICD-562.10) Assessment: Unchanged Discussed diet and need for regularity. RTC for prolonged LLQ discomfort.  Problem # 7:  DIABETES MELLITUS (ICD-250.00) Assessment: Unchanged Adequate. She wants better, will follow. Her updated medication list for this problem includes:    Enalapril Maleate 20 Mg Tabs (Enalapril maleate) .Marland Kitchen... 1 by mouth two times a day    Metformin Hcl 1000 Mg Tabs (Metformin hcl) .Marland Kitchen... 1/2 by mouth every morning 1 by mouth each evening    Glimepiride 4 Mg Tabs (Glimepiride) .Marland Kitchen... 1/2 tab by mouth once daily  Labs Reviewed: Creat: 0.7 (05/09/2009)   Microalbumin: 8.2 (11/24/2005)  Last Eye Exam: normal (04/05/2009) Reviewed HgBA1c results: 6.3 (05/09/2009)  6.3 (05/02/2008)  Problem # 8:  ESSENTIAL HYPERTENSION, BENIGN (ICD-401.1) Assessment: Unchanged Will follow, does not appear to be a trend. Her updated medication list for this problem includes:    Enalapril Maleate 20 Mg Tabs (Enalapril maleate) .Marland Kitchen... 1 by mouth two times a day    Hydrochlorothiazide 12.5 Mg Tabs (Hydrochlorothiazide) .Marland Kitchen... 1 tab by mouth  every am    Amlodipine Besylate 5 Mg Tabs (Amlodipine besylate) ..... One tab by mouth at night  BP today: 140/90 Prior BP: 124/80 (12/31/2008)  Labs Reviewed: K+: 3.9 (05/09/2009) Creat: : 0.7 (05/09/2009)   Chol: 157 (05/09/2009)   HDL: 40.40 (05/09/2009)   LDL: 80 (05/09/2009)   TG: 183.0 (05/09/2009)  Problem # 9:  HYPERLIPIDEMIA/ HYPERTRIGLICERIDEMIA (ICD-272.4) Assessment: Unchanged Good job, cont as is. Her updated medication list for this problem includes:    Pravachol 20 Mg Tabs (Pravastatin sodium) ..... One tab by mouth at night  Labs  Reviewed: SGOT: 15 (05/09/2009)   SGPT: 19 (05/09/2009)   HDL:40.40 (05/09/2009), 35.60 (11/09/2008)  LDL:80 (05/09/2009), 87 (11/09/2008)  Chol:157 (05/09/2009), 152 (11/09/2008)  Trig:183.0 (05/09/2009), 145.0 (11/09/2008)  Complete Medication List: 1)  Enalapril Maleate 20 Mg Tabs (Enalapril maleate) .Marland Kitchen.. 1 by mouth two times a day 2)  Metformin Hcl 1000 Mg Tabs (Metformin hcl) .... 1/2 by mouth every morning 1 by mouth each evening 3)  Glimepiride 4 Mg Tabs (Glimepiride) .... 1/2 tab by mouth once daily 4)  Cyclobenzaprine Hcl 10 Mg Tabs (Cyclobenzaprine hcl) .... As needed for muscle pain 1 by mouth every 6 hours 5)  Macrodantin 100 Mg Caps (Nitrofurantoin macrocrystal) .Marland Kitchen.. 1 tablet after sex 6)  Hydrochlorothiazide 12.5 Mg Tabs (Hydrochlorothiazide) .Marland Kitchen.. 1 tab by mouth every am 7)  Amlodipine Besylate 5 Mg Tabs (Amlodipine besylate) .... One tab by mouth at night 8)  Anamantle Hc 3-0.5 % Kit (Lidocaine-hydrocortisone ace) .... Use rectally two times a day as needed 9)  Pravachol 20 Mg Tabs (Pravastatin sodium) .... One tab by mouth at night 10)  Fish Oil 1000 Mg Caps (Omega-3 fatty acids) .Marland Kitchen.. 1-2 tablets by mouth once daily 11)  Kapidex 60 Mg Cpdr (Dexlansoprazole) .... Take one by mouth as needed 12)  Fluoxetine Hcl (pmdd) 10 Mg Caps (Fluoxetine hcl (pmdd)) .... One a day 13)  Lidex 0.05 % Crea (Fluocinonide) .... Apply to area three times a day as needed 14)  Centrum Silver Ultra Womens Tabs (Multiple vitamins-minerals) .... Take one by mouth daily 15)  B-100 Complex Tabs (Vitamins-lipotropics) .... Take one by mouth daily  Patient Instructions: 1)  RTC Jun for BP check Prescriptions: CYCLOBENZAPRINE HCL 10 MG  TABS (CYCLOBENZAPRINE HCL) as needed for muscle pain 1 by mouth every 6 hours  #30 x 2   Entered and Authorized by:   Shaune Leeks MD   Signed by:   Shaune Leeks MD on 05/14/2009   Method used:   Electronically to        [facility] [id number]  (retail)       [address]       [place], [place]  [address]       Ph: [phone] or [phone]       Fax: [phone]   RxID:   [id number] MACRODANTIN 100 MG CAPS (NITROFURANTOIN MACROCRYSTAL) 1 tablet after sex  #30 x 2   Entered and Authorized by:   Shaune Leeks MD   Signed by:   Shaune Leeks MD on 05/14/2009   Method used:   Electronically to        [facility] (retail)       [address]       [place], [place]  [address]       Ph: [phone] or [phone]       Fax: [phone]   RxID:   [id number] PRAVACHOL 20 MG TABS (  PRAVASTATIN SODIUM) one tab by mouth at night  #90 x 3   Entered by:   Sydell Axon LPN   Authorized by:   Shaune Leeks MD   Signed by:   Sydell Axon LPN on 16/11/9602   Method used:   Print then Give to Patient   RxID:   [id number] AMLODIPINE BESYLATE 5 MG TABS (AMLODIPINE BESYLATE) one tab by mouth at night  #90 x 3   Entered by:   Sydell Axon LPN   Authorized by:   Shaune Leeks MD   Signed by:   Sydell Axon LPN on 21/30/8657   Method used:   Print then Give to Patient   RxID:   [id number] GLIMEPIRIDE 4 MG  TABS (GLIMEPIRIDE) 1/2 tab by mouth once daily  #45 x 3   Entered by:   Sydell Axon LPN   Authorized by:   Shaune Leeks MD   Signed by:   Sydell Axon LPN on 02/11/7251   Method used:   Print then Give to Patient   RxID:   [id number] METFORMIN HCL 1000 MG  TABS (METFORMIN HCL) 1/2 by mouth every morning 1 by mouth each evening  #135 x 3   Entered by:   Sydell Axon LPN   Authorized by:   Shaune Leeks MD   Signed by:   Sydell Axon LPN on 75/64/3329   Method used:   Print then Give to Patient   RxID:   [id number] ENALAPRIL MALEATE 20 MG TABS (ENALAPRIL MALEATE) 1 by mouth two times a day  #180 x 3   Entered by:   Sydell Axon LPN   Authorized by:   Shaune Leeks MD   Signed by:   Sydell Axon LPN on 09/32/3557   Method used:   Print then Give to  Patient   RxID:   [id number]   Current Allergies (reviewed today): No known allergies

## 2010-03-13 NOTE — Assessment & Plan Note (Signed)
Summary: june follow up bp check/rbh   Vital Signs:  Patient profile:   60 year old female Weight:      176 pounds Temp:     98.6 degrees F oral Pulse rate:   80 / minute Pulse rhythm:   regular BP sitting:   140 / 98  (left arm) Cuff size:   regular  Vitals Entered By: Sydell Axon LPN (July 23, 2009 9:58 AM) CC: Follow-up on BP, has not been taking HCTZ, Preventive Care   History of Present Illness: Pt here for BP recheck. She checks her BP at home and it is always in the 120s/70s. She does not want to change/add medication. She feels well now after having fought off a viulent bug that had her down for three+ weeks. Her husband is now thinking he is coming down with the same bug.  Problems Prior to Update: 1)  Depression  (ICD-311) 2)  Sprain and Strain of Right Little Finger Dip Extensor Tendon  (ICD-842.10) 3)  Schatzki's Ring  (ICD-530.3) 4)  Colonic Polyps, Hyperplastic, Hx of  (ICD-V12.72) 5)  Rectal Bleeding From Tear Via Colonosc (DR PATERSON)  (ICD-569.3) 6)  Rectal Pain  (ICD-569.42) 7)  Dysphagia Unspecified  (ICD-787.20) 8)  Special Screening Malig Neoplasms Other Sites  (ICD-V76.49) 9)  Diverticulosis of Colon  (ICD-562.10) 10)  Health Maintenance Exam  (ICD-V70.0) 11)  Low Back Pain, Chronic  (ICD-724.2) 12)  Diabetes Mellitus  (ICD-250.00) 13)  Hyperlipidemia/ Hypertrigliceridemia  (ICD-272.4) 14)  Essential Hypertension, Benign  (ICD-401.1) 15)  Shoulder Pain, Left  (ICD-719.41)  Medications Prior to Update: 1)  Enalapril Maleate 20 Mg Tabs (Enalapril Maleate) .Marland Kitchen.. 1 By Mouth Two Times A Day 2)  Metformin Hcl 1000 Mg  Tabs (Metformin Hcl) .... 1/2 By Mouth Every Morning 1 By Mouth Each Evening 3)  Glimepiride 4 Mg  Tabs (Glimepiride) .... 1/2 Tab By Mouth Once Daily 4)  Cyclobenzaprine Hcl 10 Mg  Tabs (Cyclobenzaprine Hcl) .... As Needed For Muscle Pain 1 By Mouth Every 6 Hours 5)  Macrodantin 100 Mg Caps (Nitrofurantoin Macrocrystal) .Marland Kitchen.. 1 Tablet After  Sex 6)  Hydrochlorothiazide 12.5 Mg Tabs (Hydrochlorothiazide) .Marland Kitchen.. 1 Tab By Mouth Every Am 7)  Amlodipine Besylate 5 Mg Tabs (Amlodipine Besylate) .... One Tab By Mouth At Night 8)  Anamantle Hc 3-0.5 % Kit (Lidocaine-Hydrocortisone Ace) .... Use Rectally Two Times A Day As Needed 9)  Pravachol 20 Mg Tabs (Pravastatin Sodium) .... One Tab By Mouth At Night 10)  Fish Oil 1000 Mg Caps (Omega-3 Fatty Acids) .Marland Kitchen.. 1-2 Tablets By Mouth Once Daily 11)  Kapidex 60 Mg  Cpdr (Dexlansoprazole) .... Take One By Mouth As Needed 12)  Fluoxetine Hcl (Pmdd) 10 Mg Caps (Fluoxetine Hcl (Pmdd)) .... One A Day 13)  Lidex 0.05 % Crea (Fluocinonide) .... Apply To Area Three Times A Day As Needed 14)  Centrum Silver Ultra Womens  Tabs (Multiple Vitamins-Minerals) .... Take One By Mouth Daily 15)  B-100 Complex  Tabs (Vitamins-Lipotropics) .... Take One By Mouth Daily  Allergies: No Known Drug Allergies  Physical Exam  General:  Well-developed,well-nourished,in no acute distress; alert,appropriate and cooperative throughout examination Head:  Normocephalic and atraumatic without obvious abnormalities. No apparent alopecia or balding. Sinuses NT. Eyes:  Conjunctiva clear bilaterally.  Ears:  External ear exam shows no significant lesions or deformities.  Otoscopic examination reveals clear canals, tympanic membranes are intact bilaterally without bulging, retraction, inflammation or discharge. Hearing is grossly normal bilaterally. Nose:  External nasal examination shows  no deformity or inflammation. Nasal mucosa are pink and moist without lesions or exudates. Mouth:  Oral mucosa and oropharynx without lesions or exudates.  Teeth in good repair. Neck:  No deformities, masses, or tenderness noted. Lungs:  Normal respiratory effort, chest expands symmetrically. Lungs are clear to auscultation, no crackles or wheezes. Heart:  Normal rate and regular rhythm. S1 and S2 normal without gallop, murmur, click, rub or other  extra sounds.   Impression & Recommendations:  Problem # 1:  ESSENTIAL HYPERTENSION, BENIGN (ICD-401.1) Assessment Unchanged Sounds like white coat syndrome on top of essential HTN. Is diabetic and we discussed goal. Her BP at home is great and at goal. Will follow. Discussed using BOP and her cuff to monitor for Bio feedback at home. Her updated medication list for this problem includes:    Enalapril Maleate 20 Mg Tabs (Enalapril maleate) .Marland Kitchen... 1 by mouth two times a day    Hydrochlorothiazide 12.5 Mg Tabs (Hydrochlorothiazide) .Marland Kitchen... 1 tab by mouth every am    Amlodipine Besylate 5 Mg Tabs (Amlodipine besylate) ..... One tab by mouth at night  BP today: 140/98 Prior BP: 140/90 (05/14/2009)  Labs Reviewed: K+: 3.9 (05/09/2009) Creat: : 0.7 (05/09/2009)   Chol: 157 (05/09/2009)   HDL: 40.40 (05/09/2009)   LDL: 80 (05/09/2009)   TG: 183.0 (05/09/2009)  Problem # 2:  DEPRESSION (ICD-311) Assessment: Improved  Problem # 3:  DIABETES MELLITUS (ICD-250.00) Assessment: Unchanged Encouraged to continue curr effort and medications. Her updated medication list for this problem includes:    Enalapril Maleate 20 Mg Tabs (Enalapril maleate) .Marland Kitchen... 1 by mouth two times a day    Metformin Hcl 1000 Mg Tabs (Metformin hcl) .Marland Kitchen... 1/2 by mouth every morning 1 by mouth each evening    Glimepiride 4 Mg Tabs (Glimepiride) .Marland Kitchen... 1/2 tab by mouth once daily  Complete Medication List: 1)  Enalapril Maleate 20 Mg Tabs (Enalapril maleate) .Marland Kitchen.. 1 by mouth two times a day 2)  Metformin Hcl 1000 Mg Tabs (Metformin hcl) .... 1/2 by mouth every morning 1 by mouth each evening 3)  Glimepiride 4 Mg Tabs (Glimepiride) .... 1/2 tab by mouth once daily 4)  Cyclobenzaprine Hcl 10 Mg Tabs (Cyclobenzaprine hcl) .... As needed for muscle pain 1 by mouth every 6 hours 5)  Macrodantin 100 Mg Caps (Nitrofurantoin macrocrystal) .Marland Kitchen.. 1 tablet after sex 6)  Hydrochlorothiazide 12.5 Mg Tabs (Hydrochlorothiazide) .Marland Kitchen.. 1 tab by  mouth every am 7)  Amlodipine Besylate 5 Mg Tabs (Amlodipine besylate) .... One tab by mouth at night 8)  Anamantle Hc 3-0.5 % Kit (Lidocaine-hydrocortisone ace) .... Use rectally two times a day as needed 9)  Pravachol 20 Mg Tabs (Pravastatin sodium) .... One tab by mouth at night 10)  Fish Oil 1000 Mg Caps (Omega-3 fatty acids) .Marland Kitchen.. 1-2 tablets by mouth once daily 11)  Kapidex 60 Mg Cpdr (Dexlansoprazole) .... Take one by mouth as needed 12)  Lidex 0.05 % Crea (Fluocinonide) .... Apply to area three times a day as needed 13)  Centrum Silver Ultra Womens Tabs (Multiple vitamins-minerals) .... Take one by mouth daily 14)  B-100 Complex Tabs (Vitamins-lipotropics) .... Take one by mouth daily  PAP Screening:    Last PAP smear:  04/04/2009  Mammogram Screening:    Last Mammogram:  07/03/2009  Mammogram Results:    Date of Exam:  07/03/2009    Results:  Normal Bilateral  Next Mammogram Due:    07/04/2010  Osteoporosis Risk Assessment:  Risk Factors for Fracture or  Low Bone Density:   Race (White or Asian):     yes   Smoking status:       never  Immunization & Chemoprophylaxis:    Tetanus vaccine: Tdap  (05/09/2008)    Influenza vaccine: Fluvax 3+  (10/31/2008)  Patient Instructions: 1)  RTC 11-12/11 for recheck  Current Allergies (reviewed today): No known allergies

## 2010-03-13 NOTE — Assessment & Plan Note (Signed)
Summary: followup/rbh   Vital Signs:  Patient profile:   60 year old female Weight:      178.50 pounds Temp:     98.6 degrees F oral Pulse rate:   88 / minute Pulse rhythm:   regular BP sitting:   150 / 98  (left arm) Cuff size:   large  Vitals Entered By: Sydell Axon LPN (December 04, 2009 3:18 PM) CC: follow-up visit   History of Present Illness: Pt here for followup of BP and DM. She has been under signif stress since last time with travelling to [facility] for her mother, family issues with her brother's family, etc. She has had BPs typically 110s-140s,rarely 150s/70-80s, once 100. No particular pattern and not necessarily the first of three nos taken.  Her sugar has ranged from lows twice 30s-50 in the middle of the night after having one ETOH in the evening and otherwise has been running 60-120 with a 140 2hrs after lunch.  Problems Prior to Update: 1)  Eustachian Tube Dysfunction  (ICD-381.81) 2)  Depression  (ICD-311) 3)  Schatzki's Ring  (ICD-530.3) 4)  Colonic Polyps, Hyperplastic, Hx of  (ICD-V12.72) 5)  Rectal Bleeding From Tear Via Colonosc (DR PATERSON)  (ICD-569.3) 6)  Rectal Pain  (ICD-569.42) 7)  Dysphagia Unspecified  (ICD-787.20) 8)  Special Screening Malig Neoplasms Other Sites  (ICD-V76.49) 9)  Diverticulosis of Colon  (ICD-562.10) 10)  Health Maintenance Exam  (ICD-V70.0) 11)  Low Back Pain, Chronic  (ICD-724.2) 12)  Diabetes Mellitus  (ICD-250.00) 13)  Hyperlipidemia/ Hypertrigliceridemia  (ICD-272.4) 14)  Essential Hypertension, Benign  (ICD-401.1) 15)  Shoulder Pain, Left  (ICD-719.41)  Medications Prior to Update: 1)  Enalapril Maleate 20 Mg Tabs (Enalapril Maleate) .Marland Kitchen.. 1 By Mouth Two Times A Day 2)  Metformin Hcl 1000 Mg  Tabs (Metformin Hcl) .... 1/2 By Mouth Every Morning 1 By Mouth Each Evening 3)  Glimepiride 4 Mg  Tabs (Glimepiride) .... 1/2 Tab By Mouth Once Daily 4)  Cyclobenzaprine Hcl 10 Mg  Tabs (Cyclobenzaprine Hcl) .... As Needed For  Muscle Pain 1 By Mouth Every 6 Hours 5)  Macrodantin 100 Mg Caps (Nitrofurantoin Macrocrystal) .Marland Kitchen.. 1 Tablet After Sex 6)  Amlodipine Besylate 5 Mg Tabs (Amlodipine Besylate) .... One Tab By Mouth At Night 7)  Anamantle Hc 3-0.5 % Kit (Lidocaine-Hydrocortisone Ace) .... Use Rectally Two Times A Day As Needed 8)  Pravachol 20 Mg Tabs (Pravastatin Sodium) .... One Tab By Mouth At Night 9)  Fish Oil 1000 Mg Caps (Omega-3 Fatty Acids) .Marland Kitchen.. 1-2 Tablets By Mouth Once Daily 10)  Lidex 0.05 % Crea (Fluocinonide) .... Apply To Area Three Times A Day As Needed 11)  Centrum Silver Ultra Womens  Tabs (Multiple Vitamins-Minerals) .... Take One By Mouth Daily 12)  Azithromycin 250 Mg Tabs (Azithromycin) .... 2 Pills For 1 Day and Then 1 Pill For 4 Days. 13)  Flonase 50 Mcg/act Susp (Fluticasone Propionate) .Marland Kitchen.. 1-2 Sprays Per Nostril Per Day  Allergies: No Known Drug Allergies  Physical Exam  General:  Well-developed,well-nourished,in no acute distress; alert,appropriate and cooperative throughout examination Head:  Normocephalic and atraumatic without obvious abnormalities. No apparent alopecia or balding. Sinuses NT. Eyes:  Conjunctiva clear bilaterally. Left eye minimally inflamed in the medial sulcus. No apparent cobblestoning. Ears:  External ear exam shows no significant lesions or deformities.  Otoscopic examination reveals clear canals, tympanic membranes are intact bilaterally without bulging, retraction, inflammation or discharge. Hearing is grossly normal bilaterally. Nose:  External nasal examination shows no  deformity or inflammation. Nasal mucosa are pink and moist without lesions or exudates. Mouth:  Oral mucosa and oropharynx without lesions or exudates.  Teeth in good repair. Neck:  No deformities, masses, or tenderness noted. Chest Wall:  No deformities, masses, or tenderness noted. Lungs:  Normal respiratory effort, chest expands symmetrically. Lungs are clear to auscultation, no  crackles or wheezes. Heart:  Normal rate and regular rhythm. S1 and S2 normal without gallop, murmur, click, rub or other extra sounds.   Impression & Recommendations:  Problem # 1:  ESSENTIAL HYPERTENSION, BENIGN (ICD-401.1) Assessment Improved Will continue current meds and reassess/adjust next time if needed. Her updated medication list for this problem includes:    Enalapril Maleate 20 Mg Tabs (Enalapril maleate) .Marland Kitchen... 1 by mouth two times a day    Amlodipine Besylate 5 Mg Tabs (Amlodipine besylate) ..... One tab by mouth at night  BP today: 150/98 Prior BP: 162/102 (09/06/2009)  Labs Reviewed: K+: 4.3 (11/29/2009) Creat: : 0.7 (11/29/2009)   Chol: 179 (11/29/2009)   HDL: 38.90 (11/29/2009)   LDL: 112 (11/29/2009)   TG: 140.0 (11/29/2009)  Problem # 2:  DIABETES MELLITUS (ICD-250.00) Assessment: Unchanged Lows discussed. Try having protein at bedtime if having alcohol. Her updated medication list for this problem includes:    Enalapril Maleate 20 Mg Tabs (Enalapril maleate) .Marland Kitchen... 1 by mouth two times a day    Metformin Hcl 1000 Mg Tabs (Metformin hcl) .Marland Kitchen... 1/2 by mouth every morning 1 by mouth each evening    Glimepiride 4 Mg Tabs (Glimepiride) .Marland Kitchen... 1/2 tab by mouth once daily  Labs Reviewed: Creat: 0.7 (11/29/2009)   Microalbumin: 8.2 (11/24/2005)  Last Eye Exam: normal (04/05/2009) Reviewed HgBA1c results: 6.5 (11/29/2009)  6.3 (05/09/2009)  Problem # 3:  DEPRESSION (ICD-311) Assessment: Unchanged Stable at this point.   Complete Medication List: 1)  Enalapril Maleate 20 Mg Tabs (Enalapril maleate) .Marland Kitchen.. 1 by mouth two times a day 2)  Metformin Hcl 1000 Mg Tabs (Metformin hcl) .... 1/2 by mouth every morning 1 by mouth each evening 3)  Glimepiride 4 Mg Tabs (Glimepiride) .... 1/2 tab by mouth once daily 4)  Cyclobenzaprine Hcl 10 Mg Tabs (Cyclobenzaprine hcl) .... As needed for muscle pain 1 by mouth every 6 hours 5)  Macrodantin 100 Mg Caps (Nitrofurantoin  macrocrystal) .Marland Kitchen.. 1 tablet after sex 6)  Amlodipine Besylate 5 Mg Tabs (Amlodipine besylate) .... One tab by mouth at night 7)  Anamantle Hc 3-0.5 % Kit (Lidocaine-hydrocortisone ace) .... Use rectally two times a day as needed 8)  Pravachol 20 Mg Tabs (Pravastatin sodium) .... One tab by mouth at night 9)  Fish Oil 1000 Mg Caps (Omega-3 fatty acids) .Marland Kitchen.. 1-2 tablets by mouth once daily 10)  Lidex 0.05 % Crea (Fluocinonide) .... Apply to area three times a day as needed 11)  Centrum Silver Ultra Womens Tabs (Multiple vitamins-minerals) .... Take one by mouth daily  Patient Instructions: 1)  RTC late 4/12 for Comp Exam, labs prior   Orders Added: 1)  Est. Patient Level III [56213]    Current Allergies (reviewed today): No known allergies

## 2010-03-13 NOTE — Miscellaneous (Signed)
Summary: Flu vaccine  Clinical Lists Changes  Observations: Added new observation of FLU VAX: Historical (12/28/2009 12:38)      Influenza Immunization History:    Influenza # 1:  Historical (12/28/2009)

## 2010-03-13 NOTE — Assessment & Plan Note (Signed)
Summary: cpx/dlo   Vital Signs:  Patient profile:   60 year old female Height:      68.75 inches Weight:      175 pounds BMI:     26.13 Temp:     98.1 degrees F oral Pulse rate:   76 / minute BP sitting:   148 / 90  (left arm) Cuff size:   regular  Vitals Entered By: Providence Crosby (May 09, 2008 9:19 AM)  History of Present Illness: Pt here for  Comp Exam. Started Fish Oil 6 weeks ago, stopped [employer]. BP has been elevated lately. No headaches. No sxs but knows because she monitors herself. Bilat knees hurt prior to walking. As warms up they tend to improve. ?Plantar Wart on lat right little toe. Having skin tags. Wants to know if she can snip them off, which she has been doing. ?Swallow air...gets hiccups and vomiting from food impacting. Happens more with liquids than with solids.  Preventive Screening-Counseling & Management     Alcohol drinks/day: 0     Alcohol type: wine occas     Smoking Status: never     Passive Smoke Exposure: no     Caffeine use/day: occas dietcoke and tea     Does Patient Exercise: yes     Type of exercise: treadmill and abd crunches, shoulder exs.     Exercise (avg: min/session): 30-60     Times/week: 3  Problems Prior to Update: 1)  Special Screening Malig Neoplasms Other Sites  (ICD-V76.49) 2)  Diverticulosis of Colon  (ICD-562.10) 3)  Health Maintenance Exam  (ICD-V70.0) 4)  Low Back Pain, Chronic  (ICD-724.2) 5)  Diabetes Mellitus  (ICD-250.00) 6)  Other and Unspecified Hyperlipidemia  (ICD-272.4) 7)  Essential Hypertension, Benign  (ICD-401.1) 8)  Symptom, Disturbance of Skin Sensation  (ICD-782.0) 9)  Shoulder Pain, Left  (ICD-719.41)  Medications Prior to Update: 1)  Enalapril Maleate 20 Mg Tabs (Enalapril Maleate) .Marland Kitchen.. 1 By Mouth Two Times A Day 2)  Metformin Hcl 1000 Mg  Tabs (Metformin Hcl) .... 1/2 By Mouth Every Morning  1 By Mouth Each Evening 3)  Glimepiride 4 Mg  Tabs (Glimepiride) .... 1/2 Tab By Mouth Once Daily 4)   Cyclobenzaprine Hcl 10 Mg  Tabs (Cyclobenzaprine Hcl) .... As Needed For Muscle Pain 1 Q 6hours Prn 5)  Macrodantin 100 Mg Caps (Nitrofurantoin Macrocrystal) .Marland Kitchen.. 1 Tablet After Sex 6)  Hydrochlorothiazide 12.5 Mg Tabs (Hydrochlorothiazide) .Marland Kitchen.. 1 Tab By Mouth Qam 7)  Centrum Silver  Tabs (Multiple Vitamins-Minerals) .Marland Kitchen.. 1 Daily By Mouth 8)  Revival Soy Drink Mix Daily .Marland KitchenMarland Kitchen. 1 Daily  Per Patient 9)  N[employer] Naturals Complete Omega 3-6-9 Fish Oil .Marland Kitchen.. 1 Daily By Mouth Per Patient  Allergies (verified): No Known Drug Allergies  Past History:  Family History:    Father dec 70 DM Complcns:(KIDNEY FAILURE (DIAYLSIS)    Mother A 79 DM Chol Htn: PERSONALITY DISORDER    Brother A 46  DM HTN Hospitalized freq CAD, PTCA ; GOUT    CV: + CAD GM    HBP: +BROTHER; + MOTHER; + FATHER    DM: + MOTHER; + FATHER; + SELF; + BROTHER    GOUT/ ARTHRITIS: + BROTHER    PROSTATE CANCER: NEGATIVE    BREAST/OVARIAN /UTERINE CANCER: NEGATIVE    COLON CANCER: NEGATIVE    DEPRESSION:     ETOH/DRUG ABUSE NEGATIVE    OTHER : pOSITIVE STROKE ; GM X2 :GF    :      (  05/09/2008)  Social History:    Occupation:[employer]    Retired    Married  1 Son (05/05/2007)  Risk Factors:    Alcohol Use: 0 (05/09/2008)    >5 drinks/d w/in last 3 months: N/A    Caffeine Use: occas dietcoke and tea (05/09/2008)    Diet: N/A    Exercise: yes (05/09/2008)  Risk Factors:    Smoking Status: never (05/09/2008)    Packs/Day: N/A    Cigars/wk: N/A    Pipe Use/wk: N/A    Cans of tobacco/wk: N/A    Passive Smoke Exposure: no (05/09/2008)  Past Surgical History:    CSection Breech Presentation 1982    HYSTOSONOGRAPH (DR. JARELL)- NORMAL :(01/2000)    PELVIC ULTRASOUND    Uterus  13 X 7 X8cm  FIBROID 2.6cm :(04/2003)    Colonoscopy polyps x 2  Divertics 06/01/2006  Family History:    Father dec 70 DM Complcns:(KIDNEY FAILURE (DIAYLSIS)    Mother A 79 DM Chol Htn: PERSONALITY DISORDER    Brother A 46  DM HTN Hospitalized  freq CAD, PTCA ; GOUT    CV: + CAD GM    HBP: +BROTHER; + MOTHER; + FATHER    DM: + MOTHER; + FATHER; + SELF; + BROTHER    GOUT/ ARTHRITIS: + BROTHER    PROSTATE CANCER: NEGATIVE    BREAST/OVARIAN /UTERINE CANCER: NEGATIVE    COLON CANCER: NEGATIVE    DEPRESSION:     ETOH/DRUG ABUSE NEGATIVE    OTHER : pOSITIVE STROKE ; GM X2 :GF    :       Social History:    Occupation:[employer] Retired    Married  1 Son    Caffeine use/day:  occas dietcoke and tea    Does Patient Exercise:  yes  Review of Systems General:  Denies chills, fatigue, fever, loss of appetite, malaise, sleep disorder, sweats, weakness, and weight loss; hot flashes.. Eyes:  Denies blurring, discharge, double vision, eye irritation, eye pain, halos, itching, light sensitivity, red eye, vision loss-1 eye, and vision loss-both eyes; baby cataracts bilat.. ENT:  Complains of ringing in ears; denies decreased hearing, difficulty swallowing, ear discharge, earache, hoarseness, nasal congestion, nosebleeds, postnasal drainage, sinus pressure, and sore throat; sev yrs.. CV:  Complains of palpitations; denies bluish discoloration of lips or nails, chest pain or discomfort, difficulty breathing at night, difficulty breathing while lying down, fainting, fatigue, leg cramps with exertion, lightheadness, near fainting, shortness of breath with exertion, swelling of feet, swelling of hands, and weight gain. Resp:  Denies chest discomfort, chest pain with inspiration, cough, coughing up blood, excessive snoring, hypersomnolence, morning headaches, pleuritic, shortness of breath, sputum productive, and wheezing. GI:  Denies abdominal pain, bloody stools, change in bowel habits, constipation, dark tarry stools, diarrhea, excessive appetite, gas, hemorrhoids, indigestion, loss of appetite, nausea, vomiting, vomiting blood, and yellowish skin color. GU:  Complains of nocturia; denies abnormal vaginal bleeding, decreased libido, discharge,  dysuria, genital sores, hematuria, incontinence, urinary frequency, and urinary hesitancy; once. MS:  Complains of joint pain; denies joint redness, joint swelling, loss of strength, low back pain, mid back pain, muscle aches, muscle , cramps, muscle weakness, stiffness, and thoracic pain; knees and shoulders. Derm:  Denies changes in color of skin, changes in nail beds, dryness, excessive perspiration, flushing, hair loss, insect bite(s), itching, lesion(s), poor wound healing, and rash. Neuro:  Denies brief paralysis, difficulty with concentration, disturbances in coordination, falling down, headaches, inability to speak, memory loss, numbness, poor balance,  seizures, sensation of room spinning, tingling, tremors, visual disturbances, and weakness.  Physical Exam  General:  Well-developed,well-nourished,in no acute distress; alert,appropriate and cooperative throughout examination Head:  Normocephalic and atraumatic without obvious abnormalities. No apparent alopecia or balding. Eyes:  Conjunctiva clear bilaterally.  Ears:  External ear exam shows no significant lesions or deformities.  Otoscopic examination reveals clear canals, tympanic membranes are intact bilaterally without bulging, retraction, inflammation or discharge. Hearing is grossly normal bilaterally. Nose:  External nasal examination shows no deformity or inflammation. Nasal mucosa are pink and moist without lesions or exudates. Mouth:  Oral mucosa and oropharynx without lesions or exudates.  Teeth in good repair. Neck:  No deformities, masses, or tenderness noted. Chest Wall:  No deformities, masses, or tenderness noted. Breasts:  per Dr Alfredo Batty Lungs:  Normal respiratory effort, chest expands symmetrically. Lungs are clear to auscultation, no crackles or wheezes. Heart:  Normal rate and regular rhythm. S1 and S2 normal without gallop, murmur, click, rub or other extra sounds. Abdomen:  Bowel sounds positive,abdomen soft and  non-tender without masses, organomegaly or hernias noted. Rectal:  not done Genitalia:  not done Msk:  No deformity or scoliosis noted of thoracic or lumbar spine.   Pulses:  R and L carotid,radial,femoral,dorsalis pedis and posterior tibial pulses are full and equal bilaterally Extremities:  No clubbing, cyanosis, edema, or deformity noted with normal full range of motion of all joints.  R little toe with small minimally elevated sclerotic lesion, poss start of early  plantar's wart. Knees bilat with benign jointline exam, no noted swelling. Neurologic:  No cranial nerve deficits noted. Station and gait are normal. Plantar reflexes are down-going bilaterally. DTRs are symmetrical throughout. Sensory, motor and coordinative functions appear intact. Skin:  Intact without suspicious lesions or rashes, small skin tags principally in the lateral upper chestwall area and under the axillae, R>L. Cervical Nodes:  No lymphadenopathy noted Inguinal Nodes:  No significant adenopathy Psych:  Cognition and judgment appear intact. Alert and cooperative with normal attention span and concentration. No apparent delusions, illusions, hallucinations   Impression & Recommendations:  Problem # 1:  HEALTH MAINTENANCE EXAM (ICD-V70.0) Assessment Comment Only Tdap today. Pap, Breast/Pelvic via Dr Alfredo Batty.  Problem # 2:  DIVERTICULOSIS OF COLON (ICD-562.10) Assessment: Unchanged Stable. Discussed being seen for prolonged LLQ pain.  Problem # 3:  DIABETES MELLITUS (ICD-250.00) Assessment: Unchanged Stable, generally well controlled, cont curr tx. Her updated medication list for this problem includes:    Enalapril Maleate 20 Mg Tabs (Enalapril maleate) .Marland Kitchen... 1 by mouth two times a day    Metformin Hcl 1000 Mg Tabs (Metformin hcl) .Marland Kitchen... 1/2 by mouth every morning  1 by mouth each evening    Glimepiride 4 Mg Tabs (Glimepiride) .Marland Kitchen... 1/2 tab by mouth once daily  Labs Reviewed: Creat: 0.8 (05/02/2008)    Microalbumin: 8.2 (11/24/2005)  Last Eye Exam: normal (03/15/2008) Reviewed HgBA1c results: 6.3 (05/02/2008)  6.1 (10/19/2007)  Problem # 4:  OTHER AND UNSPECIFIED HYPERLIPIDEMIA (ICD-272.4) Discussed getting on Statin for "stabilization" and toget LDL as close to 70 as poss. Suggested she look into Pravachol. Would start her at 40mg  and adjust as necessary. Since starting Amlodipine today for her BP, do not want to alsdo start Pravachol.  Encouraged to avoid fatty foods. Labs Reviewed: SGOT: 16 (05/02/2008)   SGPT: 22 (05/02/2008)   HDL:37.40 (05/02/2008), 34.7 (05/02/2007)  LDL:DEL (05/02/2007)  Chol:217 (05/02/2008), 204 (05/02/2007)  Trig:186.0 (05/02/2008), 305 (05/02/2007)  LDLD 108  Problem # 5:  ESSENTIAL HYPERTENSION,  BENIGN (ICD-401.1) Assessment: Deteriorated Add Amlodipine today. Script given. Her updated medication list for this problem includes:    Enalapril Maleate 20 Mg Tabs (Enalapril maleate) .Marland Kitchen... 1 by mouth two times a day    Hydrochlorothiazide 12.5 Mg Tabs (Hydrochlorothiazide) .Marland Kitchen... 1 tab by mouth qam    Amlodipine Besylate 5 Mg Tabs (Amlodipine besylate) ..... One tab by mouth at night  BP today: 148/90 Prior BP: 120/90 (10/25/2007)  Labs Reviewed: K+: 4.0 (05/02/2008) Creat: : 0.8 (05/02/2008)   Chol: 217 (05/02/2008)   HDL: 37.40 (05/02/2008)   LDL: DEL (05/02/2007)   TG: 186.0 (05/02/2008)  Problem # 6:  DYSPHAGIA UNSPECIFIED (ICD-787.20) Assessment: New Encouraged to use good hygiene and see me if worsens.  Problem # 7:  PLANTAR WART, R LITTLE TOE (ZOX-096.04) Assessment: New Told to leave alone and may resolve. She asked about using OTC preparations and I again specifically said to absolutely leave alone...wear nonocclusive shoes with large toebox.  Complete Medication List: 1)  Enalapril Maleate 20 Mg Tabs (Enalapril maleate) .Marland Kitchen.. 1 by mouth two times a day 2)  Metformin Hcl 1000 Mg Tabs (Metformin hcl) .... 1/2 by mouth every morning  1 by mouth  each evening 3)  Glimepiride 4 Mg Tabs (Glimepiride) .... 1/2 tab by mouth once daily 4)  Cyclobenzaprine Hcl 10 Mg Tabs (Cyclobenzaprine hcl) .... As needed for muscle pain 1 q 6hours prn 5)  Macrodantin 100 Mg Caps (Nitrofurantoin macrocrystal) .Marland Kitchen.. 1 tablet after sex 6)  Hydrochlorothiazide 12.5 Mg Tabs (Hydrochlorothiazide) .Marland Kitchen.. 1 tab by mouth qam 7)  New Chapter Whole Omega 3,5,6,7,9 1000mg   .... 2 per day 8)  Amlodipine Besylate 5 Mg Tabs (Amlodipine besylate) .... One tab by mouth at night  Other Orders: Tdap => 60yrs IM (54098) Admin 1st Vaccine (11914)  Patient Instructions: 1)  Tdap today. 2)  RTC 3 mos, BMET Prior 3)  Look into Pravachol. Prescriptions: HYDROCHLOROTHIAZIDE 12.5 MG TABS (HYDROCHLOROTHIAZIDE) 1 tab by mouth qam  #90 x 3   Entered and Authorized by:   Shaune Leeks MD   Signed by:   Shaune Leeks MD on 05/09/2008   Method used:   Print then Give to Patient   RxID:   [id number] CYCLOBENZAPRINE HCL 10 MG  TABS (CYCLOBENZAPRINE HCL) as needed for muscle pain 1 q 6hours prn  #30 x 3   Entered and Authorized by:   Shaune Leeks MD   Signed by:   Shaune Leeks MD on 05/09/2008   Method used:   Print then Give to Patient   RxID:   [id number] GLIMEPIRIDE 4 MG  TABS (GLIMEPIRIDE) 1/2 tab by mouth once daily  #45 x 3   Entered and Authorized by:   Shaune Leeks MD   Signed by:   Shaune Leeks MD on 05/09/2008   Method used:   Print then Give to Patient   RxID:   [id number] METFORMIN HCL 1000 MG  TABS (METFORMIN HCL) 1/2 by mouth every morning  1 by mouth each evening  #140 x 3   Entered and Authorized by:   Shaune Leeks MD   Signed by:   Shaune Leeks MD on 05/09/2008   Method used:   Print then Give to Patient   RxID:   [id number] ENALAPRIL MALEATE 20 MG TABS (ENALAPRIL MALEATE) 1 by mouth two times a day  #180 x 3   Entered and Authorized by:   Shaune Leeks MD   Signed by:  Shaune Leeks MD on 05/09/2008   Method used:   Print then Give to Patient   RxID:   [id number] AMLODIPINE BESYLATE 5 MG TABS (AMLODIPINE BESYLATE) one tab by mouth at night  #90 x 4   Entered and Authorized by:   Shaune Leeks MD   Signed by:   Shaune Leeks MD on 05/09/2008   Method used:   Print then Give to Patient   RxID:   [id number]    Orders Added: 1)  Tdap => 40yrs IM [84696] 2)  Admin 1st Vaccine [90471] 3)  Est. Patient 40-64 years [99396]    Influenza Immunization History:    Influenza # 1:  Fluvax 3+ (11/23/2007)   Tetanus/Td Vaccine    Vaccine Type: Tdap    Site: left deltoid    Mfr: GlaxoSmithKline    Dose: 0.5 ml    Route: IM    Given by: Providence Crosby    Exp. Date: 04/04/2010    Lot #: [id number]    VIS given: 12/28/06 version given May 09, 2008.    Appended Document: cpx/dlo    Clinical Lists Changes  Observations: Added new observation of MAMMO DUE: 06/2009 (06/28/2008 7:19) Added new observation of CHIEF CMPLNT: Preventive Care (06/28/2008 7:19) Added new observation of MAMMOGRAM: Normal Bilateral (06/20/2008 7:20)       PAP Screening:    Last PAP smear:  01/20/2002  Mammogram Screening:    Last Mammogram:  06/20/2008  Mammogram Results:    Date of Exam:  06/20/2008    Results:  Normal Bilateral  Next Mammogram Due:    06/20/2009  Osteoporosis Risk Assessment:  Risk Factors for Fracture or Low Bone Density:   Race (White or Asian):     yes   Smoking status:       never  Immunization & Chemoprophylaxis:    Tetanus vaccine: Tdap  (05/09/2008)    Influenza vaccine: Fluvax 3+  (11/23/2007)

## 2010-03-13 NOTE — Letter (Signed)
Summary: Dr.Mark Shapiro,Dilated Eye Examination Report  Dr.Mark Shapiro,Dilated Eye Examination Report   Imported By: Beau Fanny 04/08/2009 09:48:51  _____________________________________________________________________  External Attachment:    Type:   Image     Comment:   External Document  Appended Document: Dr.Mark Shapiro,Dilated Eye Examination Report    Clinical Lists Changes  Observations: Added new observation of DMEYEEXAMNXT: 04/2010 (04/08/2009 17:42) Added new observation of DMEYEEXMRES: normal (04/05/2009 17:43) Added new observation of EYE EXAM BY: Dr Nile Riggs  (04/05/2009 17:43) Added new observation of DIAB EYE EX: normal (04/05/2009 17:43)        Diabetes Management Exam:    Eye Exam:       Eye Exam done elsewhere          Date: 04/05/2009          Results: normal          Done by: Dr Nile Riggs

## 2010-03-13 NOTE — Therapy (Signed)
Summary: [facility]  [facility]   Imported By: Lanelle Bal 09/16/2009 08:33:51  _____________________________________________________________________  External Attachment:    Type:   Image     Comment:   External Document

## 2010-03-13 NOTE — Assessment & Plan Note (Signed)
Summary: CHECK PLACE ON BOTTOM OF FOOT/CLE   Vital Signs:  Patient profile:   60 year old female Height:      68.75 inches Weight:      180.50 pounds BMI:     26.95 Temp:     98.3 degrees F oral Pulse rate:   88 / minute Pulse rhythm:   regular BP sitting:   148 / 86  (left arm) Cuff size:   regular  Vitals Entered By: Delilah Shan CMA Duncan Dull) (March 05, 2010 10:29 AM) CC: Check place on bottom of foot   History of Present Illness: Pt here for bump on the bottom of her foot she noticed Sun. Hurts slightly to walk on it.  Does not go to gyms or anywhere else barefooted. Wears sneakers to the gym. Otherwise feels well with no complaints.  Problems Prior to Update: 1)  Eustachian Tube Dysfunction  (ICD-381.81) 2)  Depression  (ICD-311) 3)  Schatzki's Ring  (ICD-530.3) 4)  Colonic Polyps, Hyperplastic, Hx of  (ICD-V12.72) 5)  Rectal Bleeding From Tear Via Colonosc (DR PATERSON)  (ICD-569.3) 6)  Rectal Pain  (ICD-569.42) 7)  Dysphagia Unspecified  (ICD-787.20) 8)  Special Screening Malig Neoplasms Other Sites  (ICD-V76.49) 9)  Diverticulosis of Colon  (ICD-562.10) 10)  Health Maintenance Exam  (ICD-V70.0) 11)  Low Back Pain, Chronic  (ICD-724.2) 12)  Diabetes Mellitus  (ICD-250.00) 13)  Hyperlipidemia/ Hypertrigliceridemia  (ICD-272.4) 14)  Essential Hypertension, Benign  (ICD-401.1) 15)  Shoulder Pain, Left  (ICD-719.41)  Medications Prior to Update: 1)  Enalapril Maleate 20 Mg Tabs (Enalapril Maleate) .Marland Kitchen.. 1 By Mouth Two Times A Day 2)  Metformin Hcl 1000 Mg  Tabs (Metformin Hcl) .... 1/2 By Mouth Every Morning 1 By Mouth Each Evening 3)  Glimepiride 4 Mg  Tabs (Glimepiride) .... 1/2 Tab By Mouth Once Daily 4)  Cyclobenzaprine Hcl 10 Mg  Tabs (Cyclobenzaprine Hcl) .... As Needed For Muscle Pain 1 By Mouth Every 6 Hours 5)  Macrodantin 100 Mg Caps (Nitrofurantoin Macrocrystal) .Marland Kitchen.. 1 Tablet After Sex 6)  Amlodipine Besylate 5 Mg Tabs (Amlodipine Besylate) .... One Tab By  Mouth At Night 7)  Anamantle Hc 3-0.5 % Kit (Lidocaine-Hydrocortisone Ace) .... Use Rectally Two Times A Day As Needed 8)  Pravachol 20 Mg Tabs (Pravastatin Sodium) .... One Tab By Mouth At Night 9)  Fish Oil 1000 Mg Caps (Omega-3 Fatty Acids) .Marland Kitchen.. 1-2 Tablets By Mouth Once Daily 10)  Lidex 0.05 % Crea (Fluocinonide) .... Apply To Area Three Times A Day As Needed 11)  Centrum Silver Ultra Womens  Tabs (Multiple Vitamins-Minerals) .... Take One By Mouth Daily  Current Medications (verified): 1)  Enalapril Maleate 20 Mg Tabs (Enalapril Maleate) .Marland Kitchen.. 1 By Mouth Two Times A Day 2)  Metformin Hcl 1000 Mg  Tabs (Metformin Hcl) .... 1/2 By Mouth Every Morning 1 By Mouth Each Evening 3)  Glimepiride 4 Mg  Tabs (Glimepiride) .... 1/2 Tab By Mouth Once Daily 4)  Cyclobenzaprine Hcl 10 Mg  Tabs (Cyclobenzaprine Hcl) .... As Needed For Muscle Pain 1 By Mouth Every 6 Hours 5)  Macrodantin 100 Mg Caps (Nitrofurantoin Macrocrystal) .Marland Kitchen.. 1 Tablet After Sex 6)  Amlodipine Besylate 5 Mg Tabs (Amlodipine Besylate) .... One Tab By Mouth At Night 7)  Pravachol 20 Mg Tabs (Pravastatin Sodium) .... One Tab By Mouth At Night 8)  Fish Oil 1000 Mg Caps (Omega-3 Fatty Acids) .Marland Kitchen.. 1-2 Tablets By Mouth Once Daily 9)  Centrum Silver Ultra Womens  Tabs (Multiple  Vitamins-Minerals) .... Take One By Mouth Daily 10)  Co Q-10 30 Mg  Caps (Coenzyme Q10) .... Once Daily  Allergies: No Known Drug Allergies  Physical Exam  General:  Well-developed,well-nourished,in no acute distress; alert,appropriate and cooperative throughout examination Skin:  R foot with papular skin colored lesion approx 4mm diam on the posterior side of the ball of the foot. Area shaved with disposable razor, taught pt to do same.   Impression & Recommendations:  Problem # 1:  PLANTAR WART, RIGHT FOOT (ICD-078.12) Assessment New Shave to pare down as much as poss. Then return for cautery trmt.  Problem # 2:  DIABETES MELLITUS  (ICD-250.00) Assessment: Unchanged  Discussed foot exam regularly. Her updated medication list for this problem includes:    Enalapril Maleate 20 Mg Tabs (Enalapril maleate) .Marland Kitchen... 1 by mouth two times a day    Metformin Hcl 1000 Mg Tabs (Metformin hcl) .Marland Kitchen... 1/2 by mouth every morning 1 by mouth each evening    Glimepiride 4 Mg Tabs (Glimepiride) .Marland Kitchen... 1/2 tab by mouth once daily  Labs Reviewed: Creat: 0.7 (11/29/2009)   Microalbumin: 8.2 (11/24/2005)  Last Eye Exam: normal (04/05/2009) Reviewed HgBA1c results: 6.5 (11/29/2009)  6.3 (05/09/2009)  Problem # 3:  ESSENTIAL HYPERTENSION, BENIGN (ICD-401.1) Still high. Discuss next time. Her updated medication list for this problem includes:    Enalapril Maleate 20 Mg Tabs (Enalapril maleate) .Marland Kitchen... 1 by mouth two times a day    Amlodipine Besylate 5 Mg Tabs (Amlodipine besylate) ..... One tab by mouth at night  BP today: 148/86 Prior BP: 150/98 (12/04/2009)  Labs Reviewed: K+: 4.3 (11/29/2009) Creat: : 0.7 (11/29/2009)   Chol: 179 (11/29/2009)   HDL: 38.90 (11/29/2009)   LDL: 112 (11/29/2009)   TG: 140.0 (11/29/2009)  Complete Medication List: 1)  Enalapril Maleate 20 Mg Tabs (Enalapril maleate) .Marland Kitchen.. 1 by mouth two times a day 2)  Metformin Hcl 1000 Mg Tabs (Metformin hcl) .... 1/2 by mouth every morning 1 by mouth each evening 3)  Glimepiride 4 Mg Tabs (Glimepiride) .... 1/2 tab by mouth once daily 4)  Cyclobenzaprine Hcl 10 Mg Tabs (Cyclobenzaprine hcl) .... As needed for muscle pain 1 by mouth every 6 hours 5)  Macrodantin 100 Mg Caps (Nitrofurantoin macrocrystal) .Marland Kitchen.. 1 tablet after sex 6)  Amlodipine Besylate 5 Mg Tabs (Amlodipine besylate) .... One tab by mouth at night 7)  Pravachol 20 Mg Tabs (Pravastatin sodium) .... One tab by mouth at night 8)  Fish Oil 1000 Mg Caps (Omega-3 fatty acids) .Marland Kitchen.. 1-2 tablets by mouth once daily 9)  Centrum Silver Ultra Womens Tabs (Multiple vitamins-minerals) .... Take one by mouth daily 10)   Co Q-10 30 Mg Caps (Coenzyme q10) .... Once daily  Patient Instructions: 1)  RTC 1-2 weeks for cautery trmt of Plantar's wart. 2)  Discuss BP and incr in meds next time. Prescriptions: PRAVACHOL 20 MG TABS (PRAVASTATIN SODIUM) one tab by mouth at night  #90 x 3   Entered and Authorized by:   Shaune Leeks MD   Signed by:   Shaune Leeks MD on 03/05/2010   Method used:   Print then Give to Patient   RxID:   [id number] AMLODIPINE BESYLATE 5 MG TABS (AMLODIPINE BESYLATE) one tab by mouth at night  #90 x 3   Entered and Authorized by:   Shaune Leeks MD   Signed by:   Shaune Leeks MD on 03/05/2010   Method used:   Print then Give to Patient  RxID:   [id number] GLIMEPIRIDE 4 MG  TABS (GLIMEPIRIDE) 1/2 tab by mouth once daily  #45 x 3   Entered and Authorized by:   Shaune Leeks MD   Signed by:   Shaune Leeks MD on 03/05/2010   Method used:   Print then Give to Patient   RxID:   [id number] METFORMIN HCL 1000 MG  TABS (METFORMIN HCL) 1/2 by mouth every morning 1 by mouth each evening  #135 x 3   Entered and Authorized by:   Shaune Leeks MD   Signed by:   Shaune Leeks MD on 03/05/2010   Method used:   Print then Give to Patient   RxID:   [id number] ENALAPRIL MALEATE 20 MG TABS (ENALAPRIL MALEATE) 1 by mouth two times a day  #180 x 3   Entered and Authorized by:   Shaune Leeks MD   Signed by:   Shaune Leeks MD on 03/05/2010   Method used:   Print then Give to Patient   RxID:   [id number]    Orders Added: 1)  Est. Patient Level IV [42595]    Current Allergies (reviewed today): No known allergies

## 2010-03-13 NOTE — Letter (Signed)
Summary: Dr.Seeplaputhur Sankar,[facility]Note  Dr.Seeplaputhur [facility]Note   Imported By: Beau Fanny 07/30/2009 10:02:03  _____________________________________________________________________  External Attachment:    Type:   Image     Comment:   External Document

## 2010-03-13 NOTE — Assessment & Plan Note (Signed)
Summary: ? SINUS INFECTION   Vital Signs:  Patient profile:   60 year old female Height:      68.75 inches Weight:      177 pounds BMI:     26.42 Temp:     98.5 degrees F oral Pulse rate:   88 / minute Pulse rhythm:   regular BP sitting:   162 / 102  (left arm) Cuff size:   regular  Vitals Entered By: Delilah Shan CMA [facility] Biggins Dull) (September 06, 2009 12:21 PM) CC: ? sinus infection   History of Present Illness: Never fully recovered after last month's illness.  New "smokey" smell since last OV.  Eyes have been crusted in the AM, with some watering during the day.  More sensitive to light recently.  No continuous ear pain, but occ episodic L>R ear pain.  Occ rhinorrhea.  No ST.  No FCNAV.  No myalgias but some fatigue noted by patient.  DM2 had been controlled based on A1cs.  BP was controlled with home readings.    Current Medications (verified): 1)  Enalapril Maleate 20 Mg Tabs (Enalapril Maleate) .Marland Kitchen.. 1 By Mouth Two Times A Day 2)  Metformin Hcl 1000 Mg  Tabs (Metformin Hcl) .... 1/2 By Mouth Every Morning 1 By Mouth Each Evening 3)  Glimepiride 4 Mg  Tabs (Glimepiride) .... 1/2 Tab By Mouth Once Daily 4)  Cyclobenzaprine Hcl 10 Mg  Tabs (Cyclobenzaprine Hcl) .... As Needed For Muscle Pain 1 By Mouth Every 6 Hours 5)  Macrodantin 100 Mg Caps (Nitrofurantoin Macrocrystal) .Marland Kitchen.. 1 Tablet After Sex 6)  Amlodipine Besylate 5 Mg Tabs (Amlodipine Besylate) .... One Tab By Mouth At Night 7)  Anamantle Hc 3-0.5 % Kit (Lidocaine-Hydrocortisone Ace) .... Use Rectally Two Times A Day As Needed 8)  Pravachol 20 Mg Tabs (Pravastatin Sodium) .... One Tab By Mouth At Night 9)  Fish Oil 1000 Mg Caps (Omega-3 Fatty Acids) .Marland Kitchen.. 1-2 Tablets By Mouth Once Daily 10)  Lidex 0.05 % Crea (Fluocinonide) .... Apply To Area Three Times A Day As Needed 11)  Centrum Silver Ultra Womens  Tabs (Multiple Vitamins-Minerals) .... Take One By Mouth Daily 12)  Azithromycin 250 Mg Tabs (Azithromycin) .... 2 Pills For 1 Day and  Then 1 Pill For 4 Days. 13)  Flonase 50 Mcg/act Susp (Fluticasone Propionate) .Marland Kitchen.. 1-2 Sprays Per Nostril Per Day  Allergies: No Known Drug Allergies  Past History:  Social History: Last updated: 07/10/2008 Occupation:[employer] Retired Married  1 Son Patient has never smoked.  Alcohol Use - yes-very occasional Daily Caffeine Use-3 drinks daily Illicit Drug Use - no Patient does not get regular exercise.   Review of Systems       See HPI.  Otherwise negative.    Physical Exam  General:  GEN: nad, alert and oriented HEENT: mucous membranes moist, TM w/o erythema, R>L effusion behing TM, nasal epithelium injected, OP with mild cobblestoning NECK: supple w/o LA CV: rrr. PULM: ctab, no inc wob ABD: soft, +bs EXT: no edema  Rinne localizes to R as expected.    Impression & Recommendations:  Problem # 1:  EUSTACHIAN TUBE DYSFUNCTION (ICD-381.81) She has evidence of eustacian tube dysfsn.  This could be postviral or due to a prolong sinusitis.  Would start antibiotics and flonase.  If not better, call back for ENT appointment/referral. Plan d/w patient and she understands, agrees.   Complete Medication List: 1)  Enalapril Maleate 20 Mg Tabs (Enalapril maleate) .Marland Kitchen.. 1 by mouth two times a  day 2)  Metformin Hcl 1000 Mg Tabs (Metformin hcl) .... 1/2 by mouth every morning 1 by mouth each evening 3)  Glimepiride 4 Mg Tabs (Glimepiride) .... 1/2 tab by mouth once daily 4)  Cyclobenzaprine Hcl 10 Mg Tabs (Cyclobenzaprine hcl) .... As needed for muscle pain 1 by mouth every 6 hours 5)  Macrodantin 100 Mg Caps (Nitrofurantoin macrocrystal) .Marland Kitchen.. 1 tablet after sex 6)  Amlodipine Besylate 5 Mg Tabs (Amlodipine besylate) .... One tab by mouth at night 7)  Anamantle Hc 3-0.5 % Kit (Lidocaine-hydrocortisone ace) .... Use rectally two times a day as needed 8)  Pravachol 20 Mg Tabs (Pravastatin sodium) .... One tab by mouth at night 9)  Fish Oil 1000 Mg Caps (Omega-3 fatty acids) .Marland Kitchen.. 1-2  tablets by mouth once daily 10)  Lidex 0.05 % Crea (Fluocinonide) .... Apply to area three times a day as needed 11)  Centrum Silver Ultra Womens Tabs (Multiple vitamins-minerals) .... Take one by mouth daily 12)  Azithromycin 250 Mg Tabs (Azithromycin) .... 2 pills for 1 day and then 1 pill for 4 days. 13)  Flonase 50 Mcg/act Susp (Fluticasone propionate) .Marland Kitchen.. 1-2 sprays per nostril per day  Patient Instructions: 1)  Use the antibiotics and the nasal spray.  If you aren't doing better by the middle of August, call me.  Prescriptions: FLONASE 50 MCG/ACT SUSP (FLUTICASONE PROPIONATE) 1-2 sprays per nostril per day  #1 x 1   Entered and Authorized by:   Crawford Givens MD   Signed by:   Crawford Givens MD on 09/06/2009   Method used:   [facility] to        [facility]  [address] (retail)       [address]       [place], [place]  [address]       Ph: [phone] or [phone]       Fax: [phone]   RxID:   [id number] AZITHROMYCIN 250 MG TABS (AZITHROMYCIN) 2 pills for 1 day and then 1 pill for 4 days.  #6 x 0   Entered and Authorized by:   Crawford Givens MD   Signed by:   Crawford Givens MD on 09/06/2009   Method used:   [facility]        [facility]  [address] (retail)       [address]       [place], [place]  [address]       Ph: [phone] or [phone]       Fax: [phone]   RxID:   [id number]   Current Allergies (reviewed today): No known allergies

## 2010-03-13 NOTE — Letter (Signed)
Summary: Nadara Eaton letter  [facility]  [address] [place], [place] [address]   Phone: [phone]  Fax: [phone]       09/17/2009 MRN: [id number]  SERAFINA TOPHAM [address] [place], [place]  [address]  Dear Ms. [facility] [place], and [place] announce the retirement of Arta Silence, M.D., from full-time practice at the [facility] office effective August 08, 2009 and his plans of returning part-time.  It is important to Dr. Hetty Ely and to our practice that you understand that [facility] has seven physicians in our office for your health care needs.  We will continue to offer the same exceptional care that you have today.    Dr. Hetty Ely has spoken to many of you about his plans for retirement and returning part-time in the fall.   We will continue to work with you through the transition to schedule appointments for you in the office and meet the high standards that [facility] is committed to.   Again, it is with great pleasure that we share the news that Dr. Hetty Ely will return to [facility] in October of 2011 with a reduced schedule.    If you have any questions, or would like to request an appointment with one of our physicians, please call us at [phone] and press the option for Scheduling an appointment.  We take pleasure in providing you with excellent patient care and look forward to seeing you at your next office visit.  Our [facility] Physicians are:  Tillman Abide, M.D. Laurita Quint, M.D. Roxy Manns, M.D. Kerby Nora, M.D. Hannah Beat, M.D. Ruthe Mannan, M.D. We proudly welcomed Raechel Ache, M.D. and Eustaquio Boyden, M.D. to the practice in July/August 2011.  Sincerely,  [facility]

## 2010-04-01 ENCOUNTER — Encounter: Payer: Self-pay | Admitting: Family Medicine

## 2010-04-07 ENCOUNTER — Encounter: Payer: Self-pay | Admitting: Family Medicine

## 2010-04-17 ENCOUNTER — Telehealth: Payer: Self-pay | Admitting: Family Medicine

## 2010-04-17 NOTE — Letter (Signed)
Summary: Elizabeth Curtis  [facility]   Imported By: Kassie Mends 04/11/2010 08:07:37  _____________________________________________________________________  External Attachment:    Type:   Image     Comment:   External Document

## 2010-04-22 NOTE — Progress Notes (Signed)
Summary: having cataract surgery  Phone Note Call from Patient Call back at [phone]   Caller: Patient Call For: Shaune Leeks MD Summary of Call: Patient is having cataract surgery on 05-07-10, she is asking if she needs to stop taking any of her current meds before having the surgery. Pleae advise.  Initial call taken by: Melody Comas,  April 17, 2010 4:38 PM  Follow-up for Phone Call        Would stop Metformin 2 days prior just in case would need contrast for some reason. If doesn't eat day of surgery, don't take Glimepiride until she eats. Follow-up by: Shaune Leeks MD,  April 17, 2010 4:54 PM  Additional Follow-up for Phone Call Additional follow up Details #1::        Patient notified.  Additional Follow-up by: Melody Comas,  April 18, 2010 9:08 AM

## 2010-05-08 ENCOUNTER — Telehealth: Payer: Self-pay | Admitting: *Deleted

## 2010-05-08 NOTE — Telephone Encounter (Signed)
Patient had a mole that Dr. Adolphus Birchwood removed. Patient is really upset because they told her it was cancerous and she is asking if you could call her at you earliest convenience.

## 2010-05-08 NOTE — Telephone Encounter (Signed)
Spoke with pt. She had a melanoma removed and is now being referred to [facility] for followup. Wanted my opinion about the place. We discussed the situation. She also just had cataract surgery and is adjusting to that.

## 2010-05-15 ENCOUNTER — Other Ambulatory Visit: Payer: Self-pay

## 2010-05-19 LAB — GLUCOSE, CAPILLARY: Glucose-Capillary: 105 mg/dL — ABNORMAL HIGH (ref 70–99)

## 2010-05-20 DIAGNOSIS — Z85820 Personal history of malignant melanoma of skin: Secondary | ICD-10-CM | POA: Insufficient documentation

## 2010-05-21 ENCOUNTER — Encounter: Payer: Self-pay | Admitting: Family Medicine

## 2010-06-12 ENCOUNTER — Encounter: Payer: Self-pay | Admitting: Family Medicine

## 2010-06-24 NOTE — Assessment & Plan Note (Signed)
[facility]                                 ON-CALL NOTE   MAY, MANRIQUE                    MRN:          [id number]  DATE:10/25/2006                            DOB:          [birth year]    PRIMARY CARE PHYSICIAN:  Arta Silence, M.D.   TIME OF CALL:  6:13 p.m. on October 25, 2006.   Elizabeth Curtis put shutters up on her house on Saturday.  She did not  notice pain until later in the day.  The pain was pretty bad on Sunday,  so she went and got a massage, but there were no medical offices open.  She did feel a little bit better today but not great, so she went and  saw a chiropractor and then decided to call today after hours because  she was still having problems.  She has tried Flexeril that she had left  over and has some left over Vicodin.  She wonders if she can take that  along with the Flexeril.  She has had a little numbness in the arm with  the effected shoulder, I believe on the left side, but no weakness.   PLAN:  I told her this was probably not an emergency and that after  hours it was unlikely that an emergency room would be necessary.  I did  tell her to go ahead and try some ice on the shoulder instead of heat,  as that may calm it down some, and that it would be okay to try the  Vicodin with the Flexeril as long as she was ready to be knocked out  which she thought would be a good idea.  I did tell her that we ought to  see her for evaluation tomorrow if she was not feeling any better.     Elizabeth Schwalbe, MD  Electronically Signed    RIL/MedQ  DD: 10/25/2006  DT: 10/26/2006  Job #: [id number]

## 2010-07-11 ENCOUNTER — Ambulatory Visit: Payer: Self-pay | Admitting: Unknown Physician Specialty

## 2010-07-11 LAB — HM MAMMOGRAPHY: HM Mammogram: NORMAL

## 2010-09-02 ENCOUNTER — Other Ambulatory Visit: Payer: Self-pay | Admitting: Family Medicine

## 2010-09-02 DIAGNOSIS — E119 Type 2 diabetes mellitus without complications: Secondary | ICD-10-CM

## 2010-09-02 DIAGNOSIS — I10 Essential (primary) hypertension: Secondary | ICD-10-CM

## 2010-09-02 DIAGNOSIS — K5730 Diverticulosis of large intestine without perforation or abscess without bleeding: Secondary | ICD-10-CM

## 2010-09-02 DIAGNOSIS — R1310 Dysphagia, unspecified: Secondary | ICD-10-CM

## 2010-09-02 DIAGNOSIS — C4359 Malignant melanoma of other part of trunk: Secondary | ICD-10-CM

## 2010-09-04 ENCOUNTER — Other Ambulatory Visit (INDEPENDENT_AMBULATORY_CARE_PROVIDER_SITE_OTHER): Payer: 59 | Admitting: Family Medicine

## 2010-09-04 DIAGNOSIS — C4359 Malignant melanoma of other part of trunk: Secondary | ICD-10-CM

## 2010-09-04 DIAGNOSIS — E119 Type 2 diabetes mellitus without complications: Secondary | ICD-10-CM

## 2010-09-04 DIAGNOSIS — R1310 Dysphagia, unspecified: Secondary | ICD-10-CM

## 2010-09-04 DIAGNOSIS — I10 Essential (primary) hypertension: Secondary | ICD-10-CM

## 2010-09-04 DIAGNOSIS — K5730 Diverticulosis of large intestine without perforation or abscess without bleeding: Secondary | ICD-10-CM

## 2010-09-04 LAB — HEPATIC FUNCTION PANEL
AST: 16 U/L (ref 0–37)
Albumin: 4.4 g/dL (ref 3.5–5.2)
Alkaline Phosphatase: 56 U/L (ref 39–117)
Total Protein: 7.3 g/dL (ref 6.0–8.3)

## 2010-09-04 LAB — CBC WITH DIFFERENTIAL/PLATELET
Basophils Relative: 0.9 % (ref 0.0–3.0)
Eosinophils Absolute: 0.2 10*3/uL (ref 0.0–0.7)
HCT: 40.2 % (ref 36.0–46.0)
Hemoglobin: 13.8 g/dL (ref 12.0–15.0)
Lymphocytes Relative: 32.2 % (ref 12.0–46.0)
MCHC: 34.4 g/dL (ref 30.0–36.0)
MCV: 87 fl (ref 78.0–100.0)
Neutro Abs: 3.2 10*3/uL (ref 1.4–7.7)
RBC: 4.62 Mil/uL (ref 3.87–5.11)

## 2010-09-04 LAB — RENAL FUNCTION PANEL
Albumin: 4.4 g/dL (ref 3.5–5.2)
BUN: 11 mg/dL (ref 6–23)
Creatinine, Ser: 0.6 mg/dL (ref 0.4–1.2)
Glucose, Bld: 131 mg/dL — ABNORMAL HIGH (ref 70–99)
Phosphorus: 3.4 mg/dL (ref 2.3–4.6)

## 2010-09-04 LAB — MICROALBUMIN / CREATININE URINE RATIO: Microalb Creat Ratio: 0.6 mg/g (ref 0.0–30.0)

## 2010-09-04 LAB — LIPID PANEL
Cholesterol: 214 mg/dL — ABNORMAL HIGH (ref 0–200)
Total CHOL/HDL Ratio: 5
Triglycerides: 193 mg/dL — ABNORMAL HIGH (ref 0.0–149.0)

## 2010-09-10 ENCOUNTER — Ambulatory Visit (INDEPENDENT_AMBULATORY_CARE_PROVIDER_SITE_OTHER): Payer: 59 | Admitting: Family Medicine

## 2010-09-10 ENCOUNTER — Encounter: Payer: Self-pay | Admitting: Family Medicine

## 2010-09-10 DIAGNOSIS — F3289 Other specified depressive episodes: Secondary | ICD-10-CM

## 2010-09-10 DIAGNOSIS — R1310 Dysphagia, unspecified: Secondary | ICD-10-CM

## 2010-09-10 DIAGNOSIS — E119 Type 2 diabetes mellitus without complications: Secondary | ICD-10-CM

## 2010-09-10 DIAGNOSIS — I10 Essential (primary) hypertension: Secondary | ICD-10-CM

## 2010-09-10 DIAGNOSIS — C4359 Malignant melanoma of other part of trunk: Secondary | ICD-10-CM

## 2010-09-10 DIAGNOSIS — K5730 Diverticulosis of large intestine without perforation or abscess without bleeding: Secondary | ICD-10-CM

## 2010-09-10 DIAGNOSIS — F329 Major depressive disorder, single episode, unspecified: Secondary | ICD-10-CM

## 2010-09-10 NOTE — Assessment & Plan Note (Signed)
Come in for prolonged LLQ discomfort.

## 2010-09-10 NOTE — Assessment & Plan Note (Signed)
Encouraged regular followup.

## 2010-09-10 NOTE — Progress Notes (Signed)
  Subjective:    Patient ID: Elizabeth Curtis, female    DOB: 1951-01-18, 60 y.o.   MRN: [id number]  HPI Pt here for Comp Exam. She sees Dr Alfredo Batty for her Gyn care. She had colonoscopy by Dr Jarold Motto 6/10 with divertics. He BP has been running slightly elevated and she has resisted increased medication. She has not been to the gym lately. Her cancer surgery kept her out of activity for a while.  She had her three month checkup recently and was felt to be stable. She had melanoma. She had allergy to tape and glue used to close the wound.  She has not had middle of the night lows but has had some midmorning lows.  She had mammo 6/12. Nml.     Review of Systems  Constitutional: Negative for fever, chills, diaphoresis, fatigue and unexpected weight change.  HENT: Negative for hearing loss, ear pain, rhinorrhea and trouble swallowing. Tinnitus: chronic.   Eyes: Negative for pain, discharge and visual disturbance.       Had cataract surgery.  Respiratory: Negative for cough, shortness of breath and wheezing.   Cardiovascular: Negative for chest pain, palpitations and leg swelling.       No Fainting or Fatigue.  Gastrointestinal: Positive for diarrhea and constipation. Negative for nausea, vomiting, abdominal pain and blood in stool.       No Heartburn  Genitourinary: Negative for dysuria and frequency.  Musculoskeletal: Negative for myalgias, back pain and arthralgias.  Skin: Negative for rash.       No Itching or Dryness. Had melanoma this Spring.  Neurological: Positive for headaches. Negative for tremors and numbness.       No Tingling. No Balance Problems.  Hematological: Negative for adenopathy. Does not bruise/bleed easily.  Psychiatric/Behavioral: Negative for dysphoric mood and agitation.       Objective:   Physical Exam  Constitutional: She is oriented to person, place, and time. She appears well-developed and well-nourished. No distress.  HENT:  Head: Normocephalic and  atraumatic.  Left Ear: External ear normal.  Nose: Nose normal.  Mouth/Throat: Oropharynx is clear and moist. No oropharyngeal exudate.  Eyes: Conjunctivae and EOM are normal. Pupils are equal, round, and reactive to light. No scleral icterus.  Neck: Normal range of motion. Neck supple. No thyromegaly present.  Cardiovascular: Normal rate, regular rhythm and normal heart sounds.  Exam reveals no friction rub.   No murmur heard. Pulmonary/Chest: Effort normal and breath sounds normal. No respiratory distress. She has no wheezes. She has no rales.  Abdominal: Soft. Bowel sounds are normal. She exhibits no mass. There is no tenderness.  Genitourinary: Guaiac negative stool.  Musculoskeletal: Normal range of motion. She exhibits no edema and no tenderness.  Lymphadenopathy:    She has no cervical adenopathy.  Neurological: She is alert and oriented to person, place, and time. She has normal reflexes.       Monofilament nml.  Skin: Skin is warm and dry. No rash noted. She is not diaphoretic. No erythema.  Psychiatric: She has a normal mood and affect. Her behavior is normal. Judgment and thought content normal.          Assessment & Plan:  HMPE

## 2010-09-10 NOTE — Assessment & Plan Note (Signed)
Seems stable, essentially resolved. Statin may have worsened.  Disussed aura of cancer.

## 2010-09-10 NOTE — Assessment & Plan Note (Addendum)
On high side. Her diary supports normotension most of the time. Discussed adding Hctz to Vasotec. Will hold off. BP Readings from Last 3 Encounters:  09/10/10 144/72  03/05/10 148/86  12/04/09 150/98   Her nos avg 130s/70s.

## 2010-09-10 NOTE — Assessment & Plan Note (Signed)
Doing reasonably well.

## 2010-09-10 NOTE — Assessment & Plan Note (Signed)
A1C acceptable. Eye exam 2/12. Cataract done thereafter. Has another brewing on the other eye. Monoifilament today nml. Discussed regular foot exams at night. She stopped Pravachol due to depression. Crestor cauased muscle aches. Counseled her to try other statins. Is on ACEI, had rhinitis and wonders if could be due to this. If bothers her enough, could try ARB.

## 2010-09-10 NOTE — Patient Instructions (Addendum)
Zostavax fine. Let me know which pharmacy. Think about another statin. Think about adding HCTZ.

## 2010-09-11 ENCOUNTER — Encounter: Payer: Self-pay | Admitting: Family Medicine

## 2010-10-29 ENCOUNTER — Encounter: Payer: Self-pay | Admitting: Family Medicine

## 2011-01-29 ENCOUNTER — Other Ambulatory Visit: Payer: Self-pay | Admitting: *Deleted

## 2011-01-29 MED ORDER — GLIMEPIRIDE 4 MG PO TABS: ORAL_TABLET | ORAL | Status: DC

## 2011-01-29 MED ORDER — ENALAPRIL MALEATE 20 MG PO TABS: 20.0000 mg | ORAL_TABLET | Freq: Two times a day (BID) | ORAL | Status: DC

## 2011-01-29 MED ORDER — AMLODIPINE BESYLATE 5 MG PO TABS: 5.0000 mg | ORAL_TABLET | Freq: Every day | ORAL | Status: DC

## 2011-01-29 MED ORDER — METFORMIN HCL 1000 MG PO TABS: ORAL_TABLET | ORAL | Status: DC

## 2011-01-29 NOTE — Telephone Encounter (Signed)
Patient dropped off forms requesting refills on medications to be sent to [facility]. Refills sent electronically to pharmacy.

## 2011-02-10 DIAGNOSIS — R920 Mammographic microcalcification found on diagnostic imaging of breast: Secondary | ICD-10-CM

## 2011-02-10 HISTORY — DX: Mammographic microcalcification found on diagnostic imaging of breast: R92.0

## 2011-07-11 HISTORY — PX: BREAST BIOPSY: SHX20

## 2011-07-15 ENCOUNTER — Ambulatory Visit: Payer: Self-pay | Admitting: Obstetrics and Gynecology

## 2011-07-15 IMAGING — MG MM ADDITIONAL VIEWS AT NO CHARGE
1 series · 4 of 4 positions shown · non-contrast
Comparison: none

REASON FOR EXAM: RT MICROCALS OUTSIDE
COMMENTS:

[R CC · right · 4 of 8 slices shown]
[im 1/8]
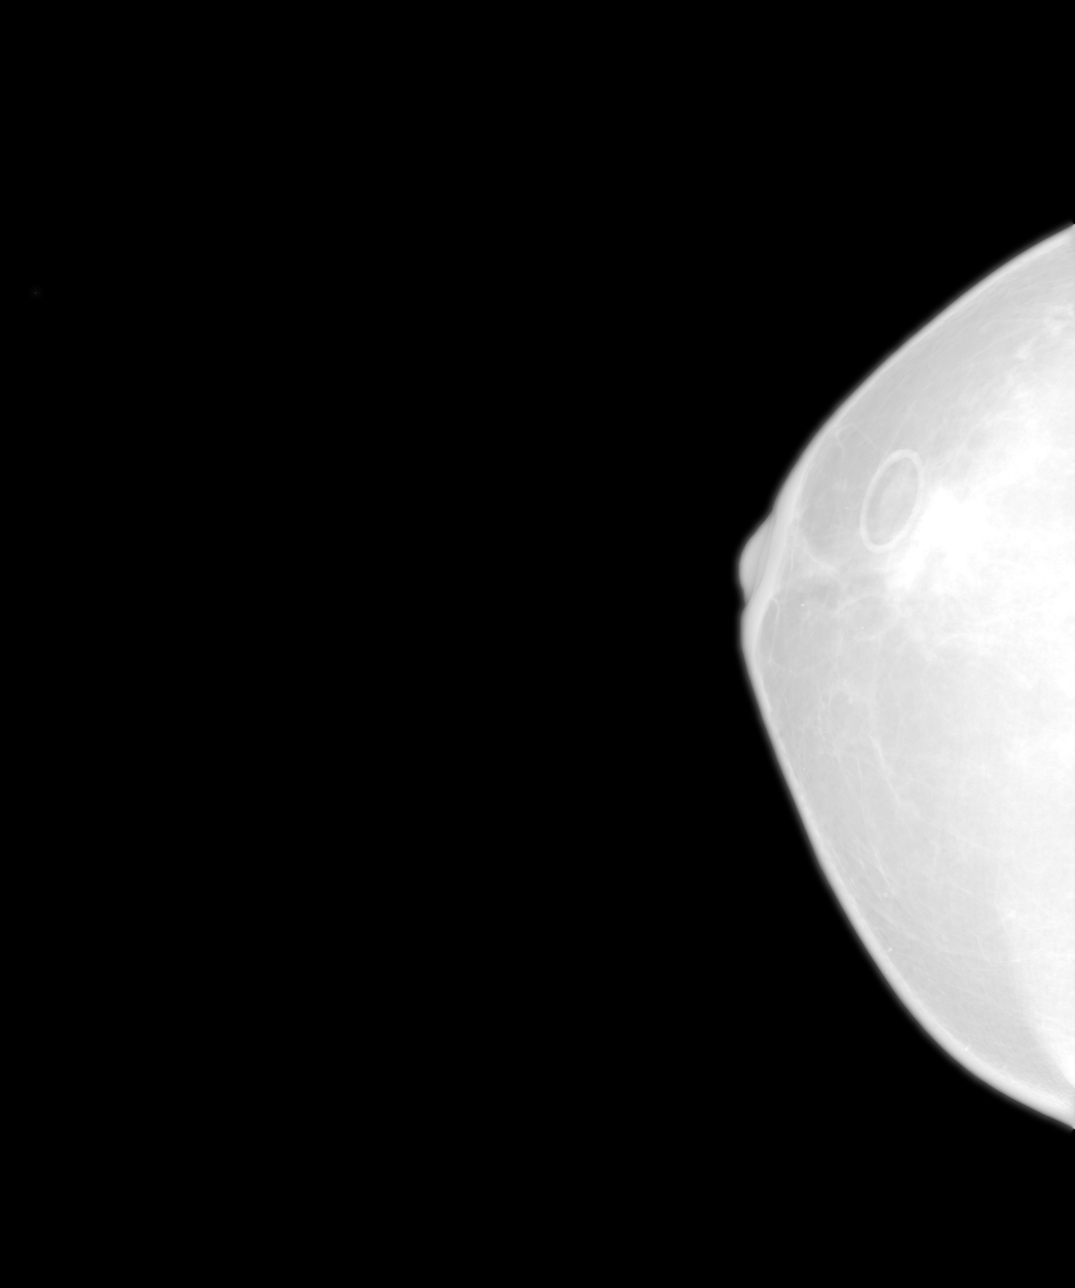
[im 3/8]
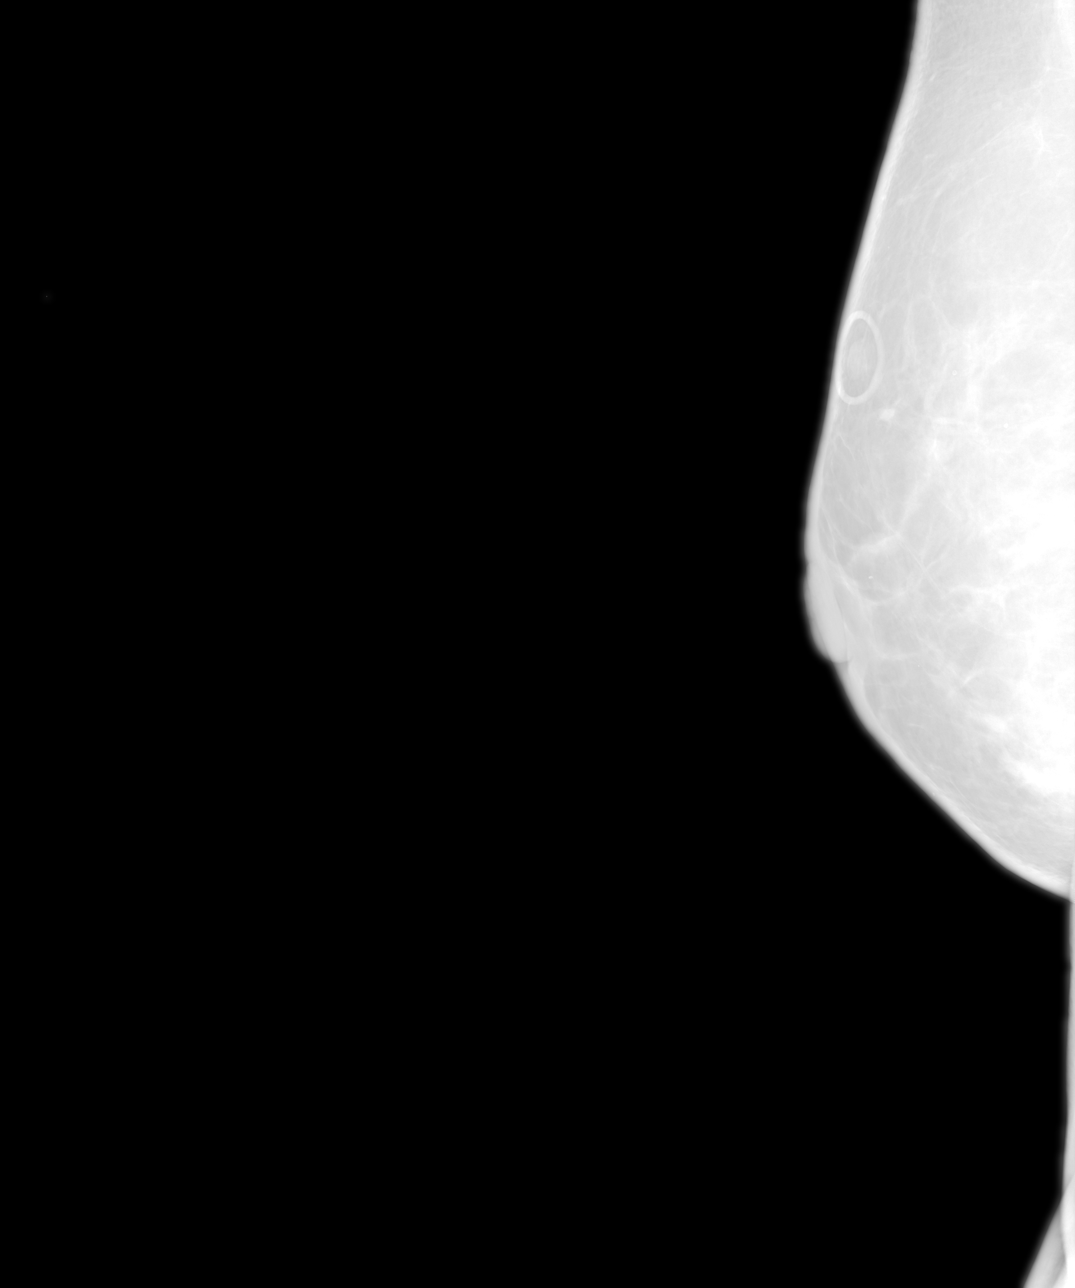
[im 5/8]
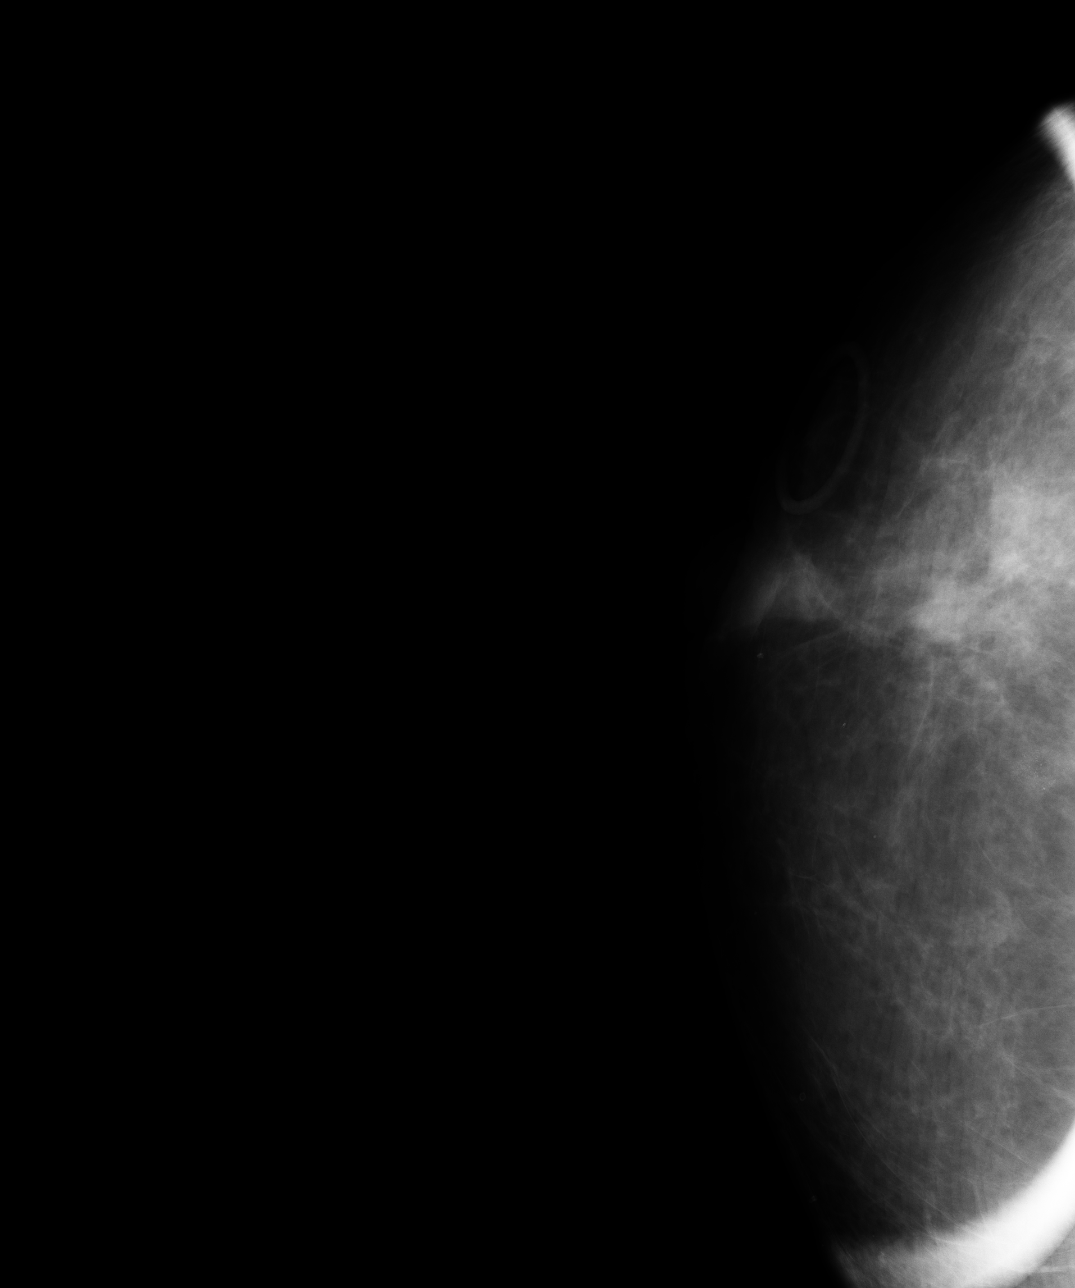
[im 8/8]
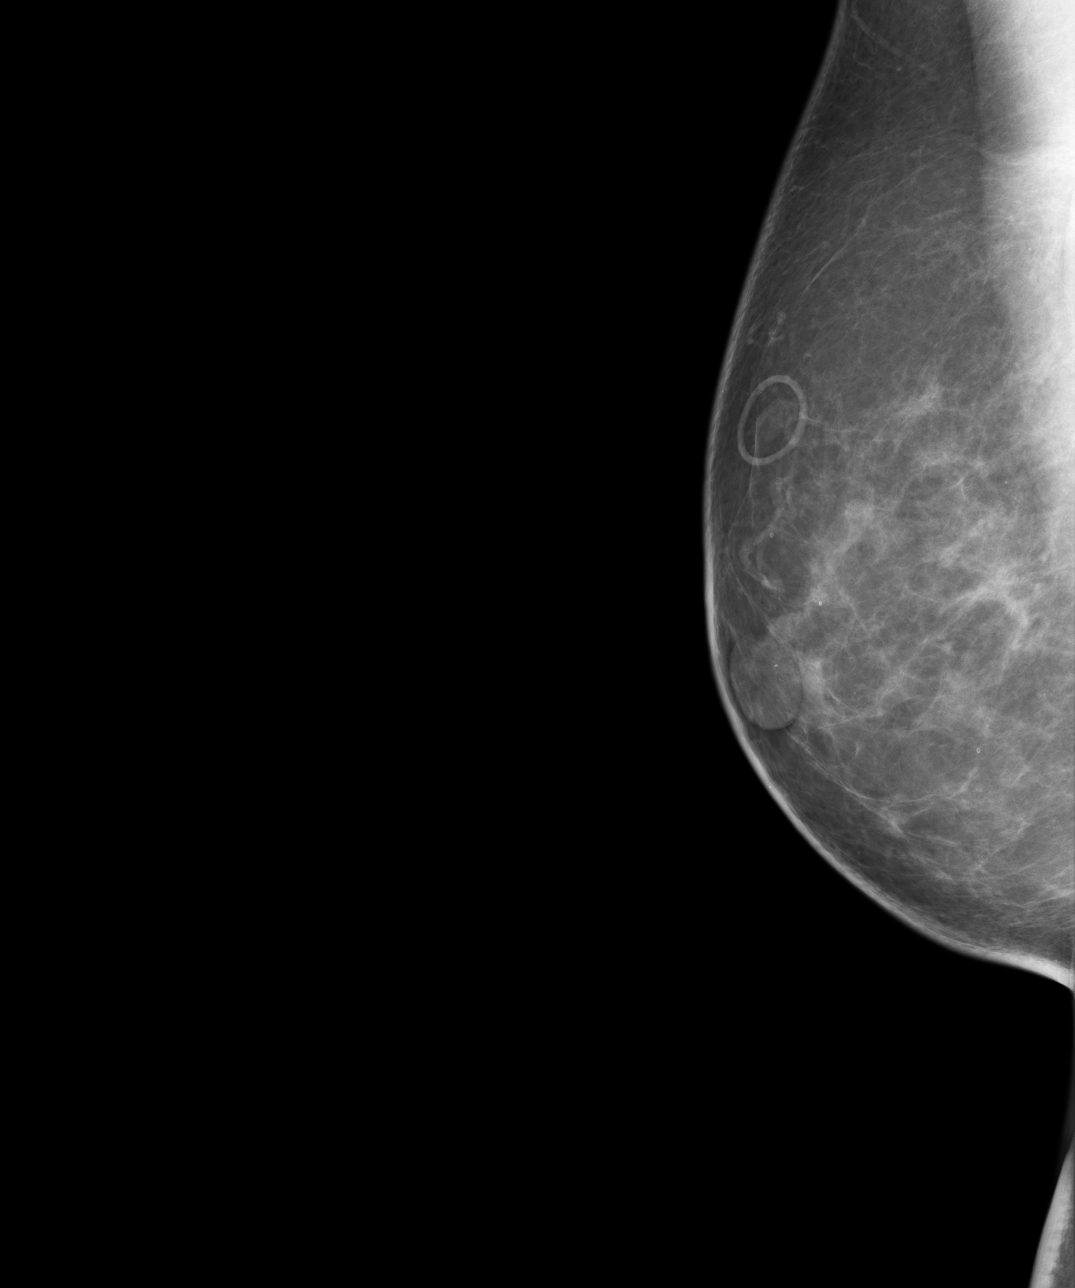

[4 of 4 positions shown; findings below may reference images not displayed]

PROCEDURE:     MAM - MAM DIG ADDVIEWS RT SCR  - [DATE]  [DATE]

RESULT:     There is a family history of breast cancer in two maternal aunts
and in one paternal aunt. The patient denies history of previous breast
surgery. The patient has a history of previous melanoma. Comparison is made
to images from [facility] dated [DATE] with comparison also made
to previous [HOSPITAL] images dated [DATE], as well as [DATE].

The right breast exhibits a moderately dense parenchymal pattern without
architectural distortion or developing parenchymal density. Images
demonstrate pleomorphic calcifications along the inferior right breast
slightly medial to the nipple on the CC view. Save screen images were
created with marks on the area of pleomorphic cluster of calcifications.
Surgical consultation for consideration for biopsy is recommended. This area
appears to be roughly the [DATE] to 6 o'clock position. No definite associated
mass is appreciated.
IMPRESSION: 1.Surgical consultation for pleomorphic cluster of calcifications in the
inferior right breast is recommended. Save screens are created marking the
area of calcifications.

BI-RADS: Category 4 - Suspicious Abnormality

A NEGATIVE MAMMOGRAM REPORT DOES NOT PRECLUDE BIOPSY OR OTHER EVALUATION OF
A CLINICALLY PALPABLE OR OTHERWISE SUSPICIOUS MASS OR LESION. BREAST CANCER
MAY NOT BE DETECTED BY MAMMOGRAPHY IN UP TO 10% OF CASES.

Dictation Site:1

## 2011-08-03 ENCOUNTER — Ambulatory Visit: Payer: Self-pay | Admitting: General Surgery

## 2011-08-05 LAB — PATHOLOGY REPORT

## 2011-09-02 ENCOUNTER — Other Ambulatory Visit: Payer: Self-pay | Admitting: Family Medicine

## 2011-09-02 ENCOUNTER — Encounter: Payer: Self-pay | Admitting: Family Medicine

## 2011-09-02 DIAGNOSIS — E785 Hyperlipidemia, unspecified: Secondary | ICD-10-CM | POA: Insufficient documentation

## 2011-09-02 DIAGNOSIS — E119 Type 2 diabetes mellitus without complications: Secondary | ICD-10-CM

## 2011-09-02 DIAGNOSIS — I10 Essential (primary) hypertension: Secondary | ICD-10-CM

## 2011-09-02 DIAGNOSIS — E1169 Type 2 diabetes mellitus with other specified complication: Secondary | ICD-10-CM | POA: Insufficient documentation

## 2011-09-07 ENCOUNTER — Other Ambulatory Visit (INDEPENDENT_AMBULATORY_CARE_PROVIDER_SITE_OTHER): Payer: 59

## 2011-09-07 DIAGNOSIS — E119 Type 2 diabetes mellitus without complications: Secondary | ICD-10-CM

## 2011-09-07 DIAGNOSIS — E785 Hyperlipidemia, unspecified: Secondary | ICD-10-CM

## 2011-09-07 DIAGNOSIS — I10 Essential (primary) hypertension: Secondary | ICD-10-CM

## 2011-09-07 LAB — COMPREHENSIVE METABOLIC PANEL
Albumin: 4.2 g/dL (ref 3.5–5.2)
Alkaline Phosphatase: 48 U/L (ref 39–117)
CO2: 29 mEq/L (ref 19–32)
Calcium: 9.5 mg/dL (ref 8.4–10.5)
Chloride: 103 mEq/L (ref 96–112)
Glucose, Bld: 112 mg/dL — ABNORMAL HIGH (ref 70–99)
Potassium: 4 mEq/L (ref 3.5–5.1)
Sodium: 138 mEq/L (ref 135–145)
Total Protein: 6.7 g/dL (ref 6.0–8.3)

## 2011-09-07 LAB — MICROALBUMIN / CREATININE URINE RATIO
Microalb Creat Ratio: 0.9 mg/g (ref 0.0–30.0)
Microalb, Ur: 1.1 mg/dL (ref 0.0–1.9)

## 2011-09-07 LAB — HEMOGLOBIN A1C: Hgb A1c MFr Bld: 6.5 % (ref 4.6–6.5)

## 2011-09-07 LAB — LIPID PANEL: VLDL: 30.6 mg/dL (ref 0.0–40.0)

## 2011-09-10 ENCOUNTER — Telehealth: Payer: Self-pay

## 2011-09-10 NOTE — Telephone Encounter (Signed)
Pt's first time to see Dr Sharen Hones; pt wants to pick up copy of recent labs on 09/11/11 after lunch. Pt wants to review before her appt on 09/14/11.

## 2011-09-10 NOTE — Telephone Encounter (Signed)
Ok to do. Thanks.  

## 2011-09-11 NOTE — Telephone Encounter (Signed)
Placed up front for pickup ° °

## 2011-09-14 ENCOUNTER — Encounter: Payer: Self-pay | Admitting: Family Medicine

## 2011-09-14 ENCOUNTER — Telehealth: Payer: Self-pay | Admitting: *Deleted

## 2011-09-14 ENCOUNTER — Ambulatory Visit (INDEPENDENT_AMBULATORY_CARE_PROVIDER_SITE_OTHER): Payer: 59 | Admitting: Family Medicine

## 2011-09-14 VITALS — BP 142/84 | HR 80 | Temp 98.1°F | Ht 69.0 in | Wt 176.8 lb

## 2011-09-14 DIAGNOSIS — E785 Hyperlipidemia, unspecified: Secondary | ICD-10-CM

## 2011-09-14 DIAGNOSIS — E119 Type 2 diabetes mellitus without complications: Secondary | ICD-10-CM

## 2011-09-14 DIAGNOSIS — Z0000 Encounter for general adult medical examination without abnormal findings: Secondary | ICD-10-CM

## 2011-09-14 DIAGNOSIS — R1310 Dysphagia, unspecified: Secondary | ICD-10-CM

## 2011-09-14 DIAGNOSIS — I10 Essential (primary) hypertension: Secondary | ICD-10-CM

## 2011-09-14 DIAGNOSIS — C4359 Malignant melanoma of other part of trunk: Secondary | ICD-10-CM

## 2011-09-14 MED ORDER — METFORMIN HCL 1000 MG PO TABS: ORAL_TABLET | ORAL | Status: DC

## 2011-09-14 MED ORDER — ENALAPRIL MALEATE 20 MG PO TABS: 20.0000 mg | ORAL_TABLET | Freq: Two times a day (BID) | ORAL | Status: DC

## 2011-09-14 MED ORDER — AMLODIPINE BESYLATE 5 MG PO TABS: 5.0000 mg | ORAL_TABLET | Freq: Every day | ORAL | Status: DC

## 2011-09-14 MED ORDER — GLIMEPIRIDE 4 MG PO TABS: ORAL_TABLET | ORAL | Status: DC

## 2011-09-14 NOTE — Telephone Encounter (Signed)
Patient said you had mentioned the PNA vaccine during her visit, but then didn't say much more about it. Did you want her to get this? I can schedule a nurse visit for her if so.

## 2011-09-14 NOTE — Assessment & Plan Note (Signed)
Uncontrolled given tighter control needed due to diabetes.  Discussed statins, asked her to consider starting with red yeast rice qod.  Intolerant to other statins in past (crestor and pravastatin)

## 2011-09-14 NOTE — Assessment & Plan Note (Signed)
Regular checkups by Derm.

## 2011-09-14 NOTE — Progress Notes (Signed)
Subjective:    Patient ID: Elizabeth Curtis, female    DOB: October 22, 1950, 61 y.o.   MRN: [id number]  HPI CC: physical  Trouble with swallowing.  Son recently married.  Lots of family stress.  Significant heartburn, improves with aciphex worsens with stress.  Having some episodes of food getting stuck in restaurant, happened between Jan and May of this year.  H/o schatzki's ring s/p dilation by EGD 2010.  07/2011 had breast biopsy - benign.  rec return in 6 months for rpt mammogram.  Holding vivelle dot since biopsy.  bings copy of pathology showing benign breast tissue fibrocystic changes and microcalcifications with columnar cell hyperplasia  DM - sugars running well.  Brings log of fasting sugars running 90-110s.  Checks intermittently.  Foot exam today.  Last vision screen was 08/2011, no diabetic retinopathy.  No lows.  No paresthesias. Lab Results  Component Value Date   HGBA1C 6.5 09/07/2011    HLD - intolerant of statins in past.  crestor and pravastatin tried in past.  H/o melanoma - last year upper abdomen s/p excision.  Gets regular skin checks.  Derm - Dr. Adolphus Birchwood in [place].  LMP - 2010.  menopausal.  Preventative: [facility] at [facility], Dr. Janene Harvey.   Last pap smear 04/2011 WNL Last mammo 07/2011 Colonoscopy - Jarold Motto, 1 polyp, 2mm lipoma, o/w normal, rec rpt 10 yrs. Shingles shot - h/o shingles in past. Tetanus 2010.  Flu shot yearly.   Pneumovax - has not had, will think about it.  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 Activity: Patient does not get regular exercise, has gym membership Diet: good water, daily fruits/vegetables  Medications and allergies reviewed and updated in chart.  Past histories reviewed and updated if relevant as below. Patient Active Problem List  Diagnosis  . DIABETES MELLITUS  . DEPRESSION  . ESSENTIAL HYPERTENSION, BENIGN  .  SCHATZKI'S RING  . DIVERTICULOSIS OF COLON  . RECTAL PAIN  . SHOULDER PAIN, LEFT  . LOW BACK PAIN, CHRONIC  . DYSPHAGIA UNSPECIFIED  . COLONIC POLYPS, HYPERPLASTIC, HX OF  . Malignant melanoma of abdominal wall  . HLD (hyperlipidemia)   Past Medical History  Diagnosis Date  . Lipoma   . Schatzki's ring 07/23/08    EGD dilated O/W normal (Dr. Jarold Motto)  . History of colonic polyps     Hyperplastic  . Plantar wart   . Dysphagia, unspecified   . Diverticulosis of colon (without mention of hemorrhage)   . Chronic low back pain   . Diabetes mellitus   . HLD (hyperlipidemia)   . Essential hypertension, benign   . Disturbance of skin sensation   . Shoulder pain, left   . History of pelvic ultrasound 03/05    Uterus 13 X 7 X 8cm, Fibroid 2.6cm   Past Surgical History  Procedure Date  . Cesarean section 1982    Breech presentation  . Colonoscopy 2010    [facility], rec rpt 10 yrs  . Esophagogastroduodenoscopy 2010    [facility], dilated schatzki ring  . Breast biopsy 07/2011    rec rpt 6 mo   History  Substance Use Topics  . Smoking status: Never Smoker   . Smokeless tobacco: Never Used  . Alcohol Use: Yes     occassionally   Family History  Problem Relation Age of Onset  . Diabetes Mother   . Hyperlipidemia Mother   . Hypertension Mother   .  Mental illness Mother     Personality disorder  . Kidney disease Father     failure, (Diaylsis)  . Diabetes Father   . Hypertension Sister   . Diabetes Brother   . Coronary artery disease Brother 40    CAD, hospitalized freq, PTCA  . Gout Brother   . Stroke Paternal Grandfather   . Stroke Maternal Grandmother   . Cancer Maternal Aunt 39    breast  . Cancer Paternal Aunt 73    breast   No Known Allergies Current Outpatient Prescriptions on File Prior to Visit  Medication Sig Dispense Refill  . amLODipine (NORVASC) 5 MG tablet Take 1 tablet (5 mg total) by mouth at bedtime.  90 tablet  2  . enalapril (VASOTEC) 20 MG  tablet Take 1 tablet (20 mg total) by mouth 2 (two) times daily.  180 tablet  2  . fish oil-omega-3 fatty acids 1000 MG capsule Take by mouth daily.       Marland Kitchen glimepiride (AMARYL) 4 MG tablet Take 1/2 by mouth once daily  45 tablet  2  . metFORMIN (GLUCOPHAGE) 1000 MG tablet Take 1/2 by mouth every morning and one every evening  135 tablet  2  . nitrofurantoin (MACRODANTIN) 100 MG capsule Take one tablet by mouth after sex       . terconazole (TERAZOL 7) 0.4 % vaginal cream Place 1 applicator vaginally as needed.        . cyclobenzaprine (FLEXERIL) 10 MG tablet Take 10 mg by mouth every 6 (six) hours as needed. For muscle pain       . NON FORMULARY Tri-est  0.625mg  /ml 60 ml vaginal estrogen cream, use as needed          Review of Systems  Constitutional: Negative for fever, chills, activity change, appetite change, fatigue and unexpected weight change.  HENT: Negative for hearing loss and neck pain.   Eyes: Negative for visual disturbance.  Respiratory: Negative for cough, chest tightness, shortness of breath and wheezing.   Cardiovascular: Negative for chest pain, palpitations and leg swelling.  Gastrointestinal: Positive for constipation and blood in stool (with wiping when constipated). Negative for nausea, vomiting, abdominal pain, diarrhea and abdominal distention.  Genitourinary: Negative for hematuria and difficulty urinating.  Musculoskeletal: Negative for myalgias and arthralgias.  Skin: Negative for rash.  Neurological: Negative for dizziness, seizures, syncope and headaches.  Hematological: Does not bruise/bleed easily.  Psychiatric/Behavioral: Negative for dysphoric mood. The patient is not nervous/anxious.        Objective:   Physical Exam  Nursing note and vitals reviewed. Constitutional: She is oriented to person, place, and time. She appears well-developed and well-nourished. No distress.  HENT:  Head: Normocephalic and atraumatic.  Right Ear: External ear normal.    Left Ear: External ear normal.  Nose: Nose normal.  Mouth/Throat: Oropharynx is clear and moist. No oropharyngeal exudate.  Eyes: Conjunctivae and EOM are normal. Pupils are equal, round, and reactive to light. No scleral icterus.  Neck: Normal range of motion. Neck supple. No thyromegaly present.  Cardiovascular: Normal rate, regular rhythm, normal heart sounds and intact distal pulses.   No murmur heard. Pulses:      Radial pulses are 2+ on the right side, and 2+ on the left side.  Pulmonary/Chest: Effort normal and breath sounds normal. No respiratory distress. She has no wheezes. She has no rales.  Abdominal: Soft. Bowel sounds are normal. She exhibits no distension and no mass. There is no tenderness. There  is no rebound and no guarding.  Musculoskeletal: Normal range of motion. She exhibits no edema.       Diabetic foot exam: Normal inspection No skin breakdown No calluses  Normal DP/PT pulses Normal sensation to light tough and monofilament Nails normal   Lymphadenopathy:    She has no cervical adenopathy.  Neurological: She is alert and oriented to person, place, and time.       CN grossly intact, station and gait intact  Skin: Skin is warm and dry. No rash noted.  Psychiatric: She has a normal mood and affect. Her behavior is normal. Judgment and thought content normal.      Assessment & Plan:

## 2011-09-14 NOTE — Assessment & Plan Note (Signed)
In h/o schatzki ring. Knows to notify us if becoming bothersome for referral back to GI.

## 2011-09-14 NOTE — Patient Instructions (Addendum)
Call your insurace about the shingles shot to see if it is covered or how much it would cost and where is cheaper (here or pharmacy).  If you want to receive here, call for nurse visit. Look into daily or every other day dosing of red yeast rice. Good to see you today, call us with questions. Start going to gym again.

## 2011-09-14 NOTE — Assessment & Plan Note (Signed)
Chronic, stable. On high side of normal but numbers she brings from home with all systolics <140.  Continue meds. BP Readings from Last 3 Encounters:  09/14/11 142/84  09/10/10 144/72  03/05/10 148/86

## 2011-09-14 NOTE — Assessment & Plan Note (Addendum)
preventative protocols reviewed and updated unless pt declined. Discussed healthy diet/lifestyle. requested records of latest mammogram 07/2011 to input into chart, rec rpt 6 mo. utd pap.

## 2011-09-14 NOTE — Assessment & Plan Note (Signed)
Chronic, stable. Foot exam today. 

## 2011-09-15 NOTE — Telephone Encounter (Signed)
Yes let's do that. Remind her to wait at least 1 mo between pneumonia and shingles shot if she decides to receive both

## 2011-09-15 NOTE — Telephone Encounter (Signed)
Message left advising patient. Vaccine order form completed and in my IN box.

## 2011-09-22 ENCOUNTER — Ambulatory Visit (INDEPENDENT_AMBULATORY_CARE_PROVIDER_SITE_OTHER): Payer: 59 | Admitting: *Deleted

## 2011-09-22 DIAGNOSIS — Z23 Encounter for immunization: Secondary | ICD-10-CM

## 2011-12-16 ENCOUNTER — Ambulatory Visit: Payer: 59

## 2011-12-16 ENCOUNTER — Ambulatory Visit (INDEPENDENT_AMBULATORY_CARE_PROVIDER_SITE_OTHER): Payer: 59

## 2011-12-16 DIAGNOSIS — Z23 Encounter for immunization: Secondary | ICD-10-CM

## 2011-12-18 ENCOUNTER — Ambulatory Visit: Payer: 59

## 2012-02-15 ENCOUNTER — Ambulatory Visit: Payer: Self-pay | Admitting: General Surgery

## 2012-02-15 IMAGING — MG MM MAMMO DIAGNOSTIC UNILATERAL*R*
1 series · 5 of 5 positions shown · non-contrast
Comparison: none

REASON FOR EXAM: rt br microcalcs fu
COMMENTS:

[R CC · right · 5 of 5 slices shown]
[im 1/5]
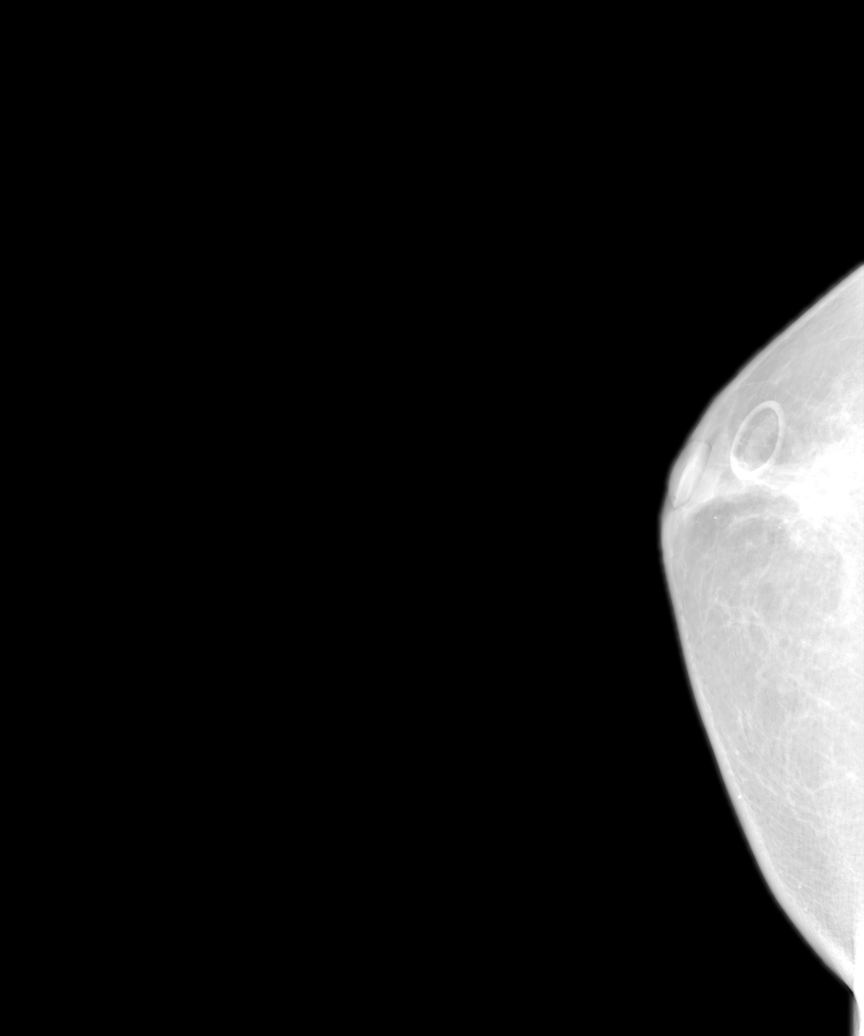
[im 2/5]
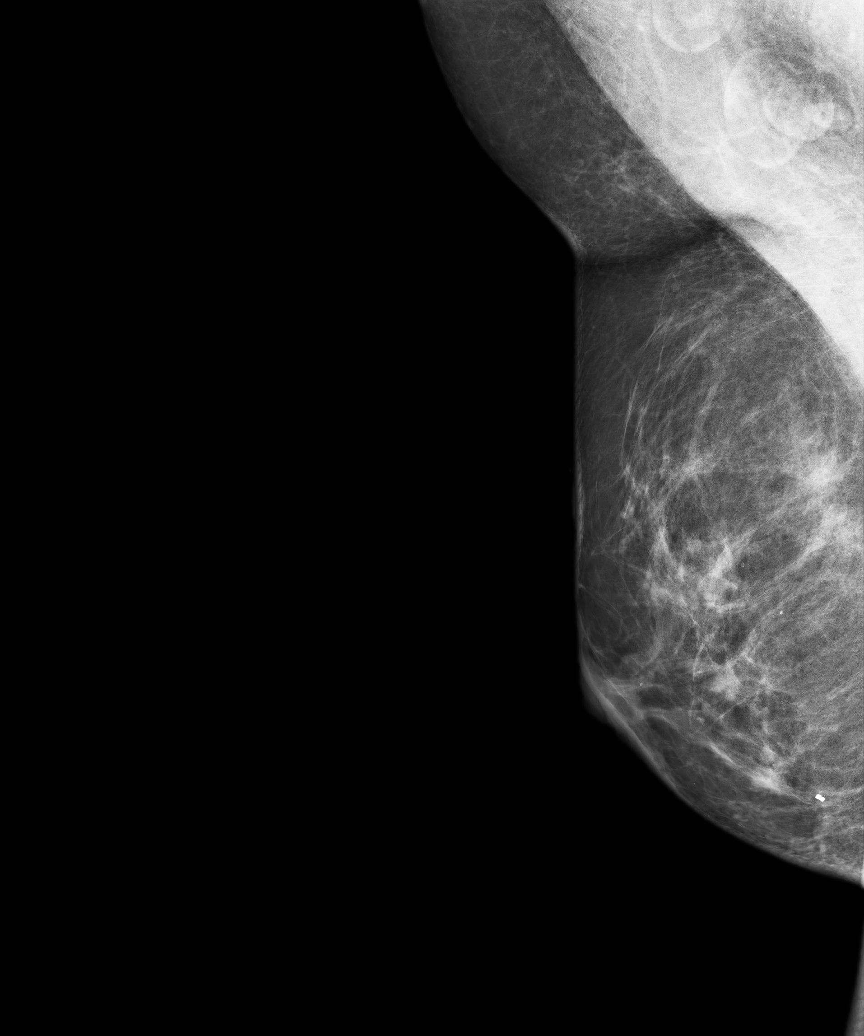
[im 3/5]
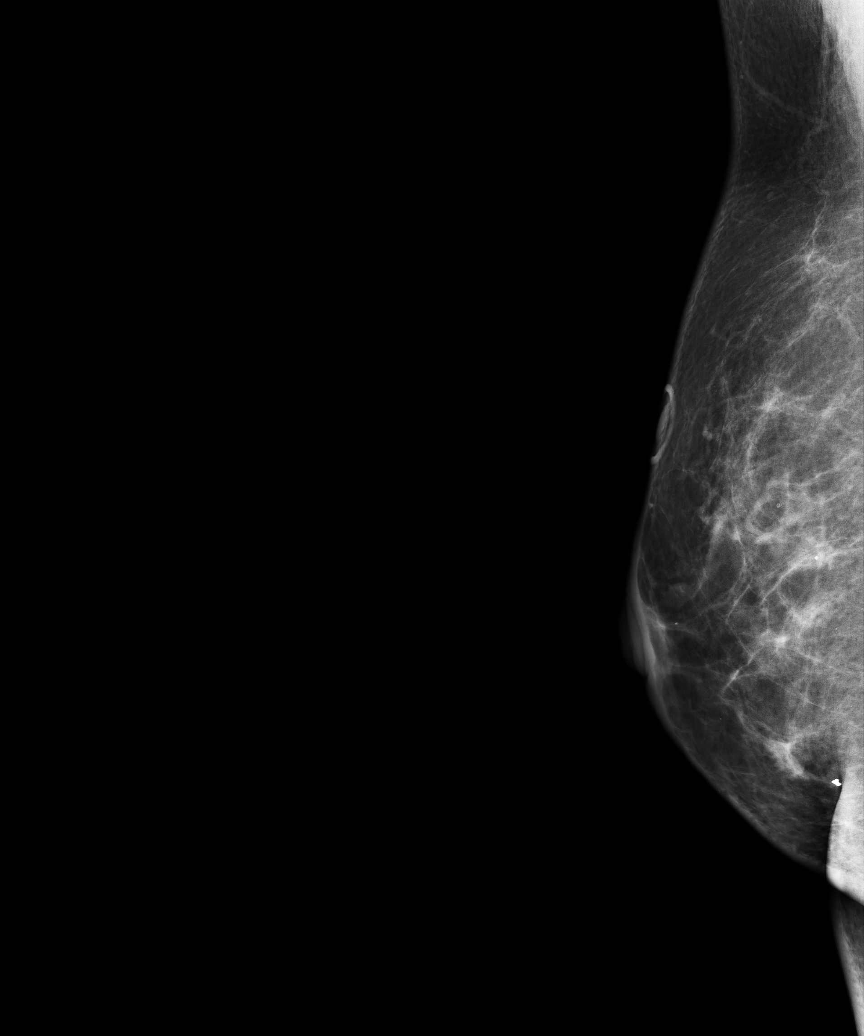
[im 4/5]
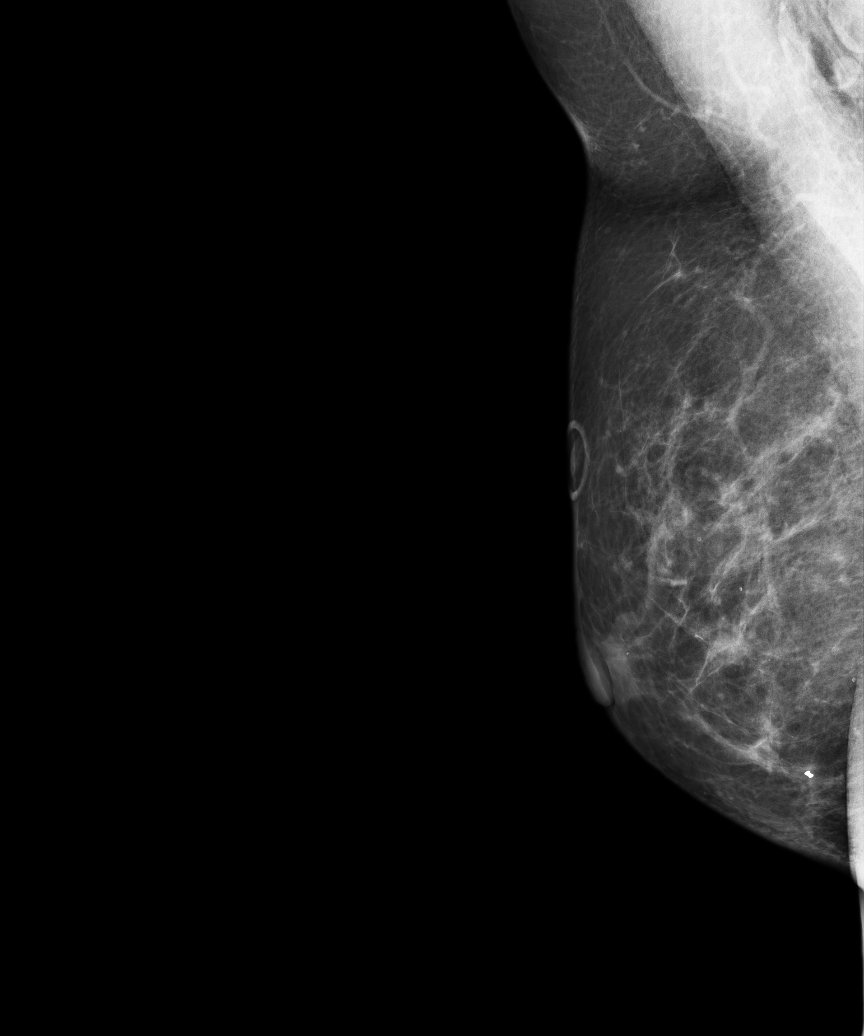
[im 5/5]
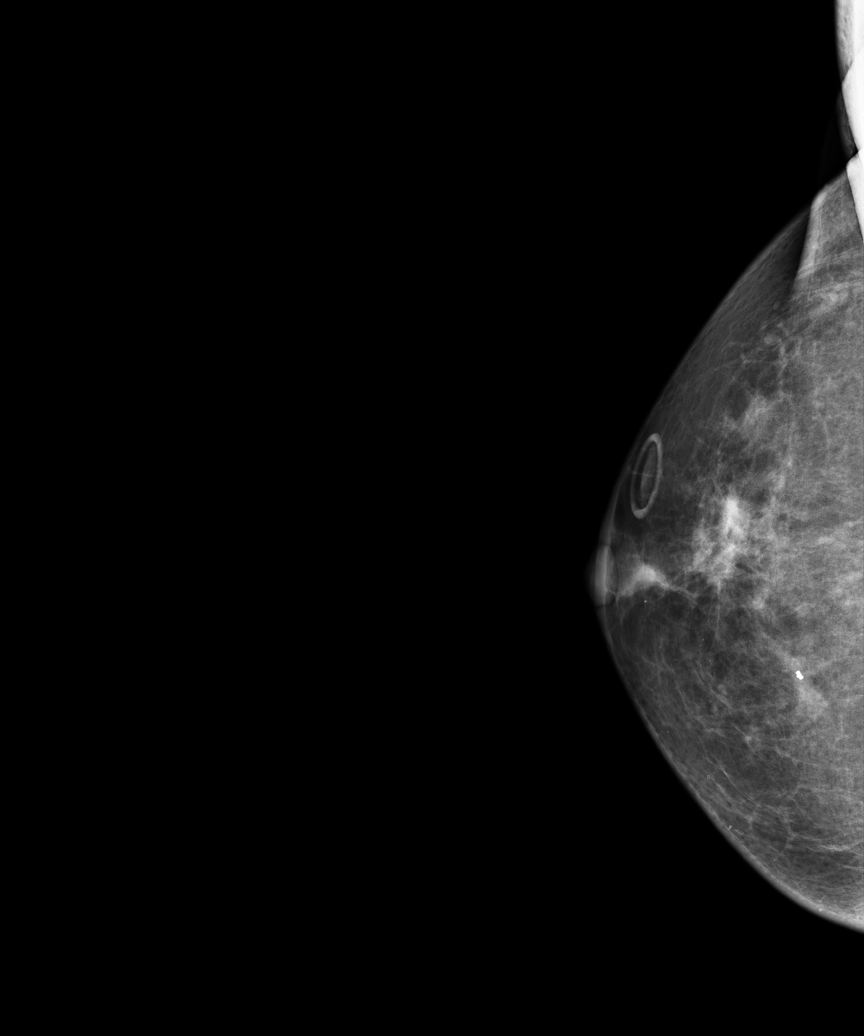

[5 of 5 positions shown; findings below may reference images not displayed]

PROCEDURE:     MAM - MAM DGTL UNI MAM RT BREAST W/CAD  - [DATE]  [DATE]

RESULT:     The patient reports a family history of breast cancer in two
maternal aunts and a paternal aunt. The patient has a history of a previous
biopsy of the right breast with negative pathology.

Unilateral right breast images including a medial to lateral projection are
compared to the previous digital studies including images dated [DATE],
as well as [DATE],[DATE] and [DATE]. There is a biopsy
marker in the inferior medial right breast. There are scattered
fibroglandular elements in the right breast without a developing parenchymal
density, dominant mass or malignant appearing calcification. No new area of
architectural distortion is seen aside from the biopsy site. Skin nevus
marker is present.
IMPRESSION: 1. Interval biopsy. No definite malignant-appearing calcification, dominant
mass or other architectural distortion. Please continue to encourage annual
mammographic follow-up and monthly breast self exam.

BI-RADS: Category 2 - Benign Finding.

BREAST COMPOSITION: The breast composition is SCATTERED FIBROGLANDULAR
TISSUE (glandular tissue is 25-50%)

A NEGATIVE MAMMOGRAM REPORT DOES NOT PRECLUDE BIOPSY OR OTHER EVALUATION OF
A CLINICALLY PALPABLE OR OTHERWISE SUSPICIOUS MASS OR LESION. BREAST CANCER
MAY NOT BE DETECTED BY MAMMOGRAPHY IN UP TO 10% OF CASES.

[REDACTED]

## 2012-04-22 ENCOUNTER — Ambulatory Visit (INDEPENDENT_AMBULATORY_CARE_PROVIDER_SITE_OTHER): Payer: 59 | Admitting: Family Medicine

## 2012-04-22 ENCOUNTER — Ambulatory Visit (INDEPENDENT_AMBULATORY_CARE_PROVIDER_SITE_OTHER)
Admission: RE | Admit: 2012-04-22 | Discharge: 2012-04-22 | Disposition: A | Discharge status: Home / Self Care | Payer: 59 | Source: Ambulatory Visit | Attending: Family Medicine | Admitting: Family Medicine

## 2012-04-22 ENCOUNTER — Other Ambulatory Visit: Payer: Self-pay | Admitting: Family Medicine

## 2012-04-22 ENCOUNTER — Telehealth: Payer: Self-pay | Admitting: Family Medicine

## 2012-04-22 VITALS — BP 162/90 | HR 68 | Temp 98.7°F | Wt 180.0 lb

## 2012-04-22 DIAGNOSIS — S99929A Unspecified injury of unspecified foot, initial encounter: Secondary | ICD-10-CM | POA: Insufficient documentation

## 2012-04-22 DIAGNOSIS — J069 Acute upper respiratory infection, unspecified: Secondary | ICD-10-CM | POA: Insufficient documentation

## 2012-04-22 DIAGNOSIS — S8990XA Unspecified injury of unspecified lower leg, initial encounter: Secondary | ICD-10-CM

## 2012-04-22 DIAGNOSIS — S99919A Unspecified injury of unspecified ankle, initial encounter: Secondary | ICD-10-CM

## 2012-04-22 DIAGNOSIS — S92912S Unspecified fracture of left toe(s), sequela: Secondary | ICD-10-CM

## 2012-04-22 IMAGING — CR DG FOOT COMPLETE 3+V*L*
3 series · 3 of 3 positions shown · non-contrast
Comparison: None

CLINICAL DATA: Left foot injury.

LEFT FOOT - COMPLETE 3+ VIEW

[view not recorded (1 of 3)]
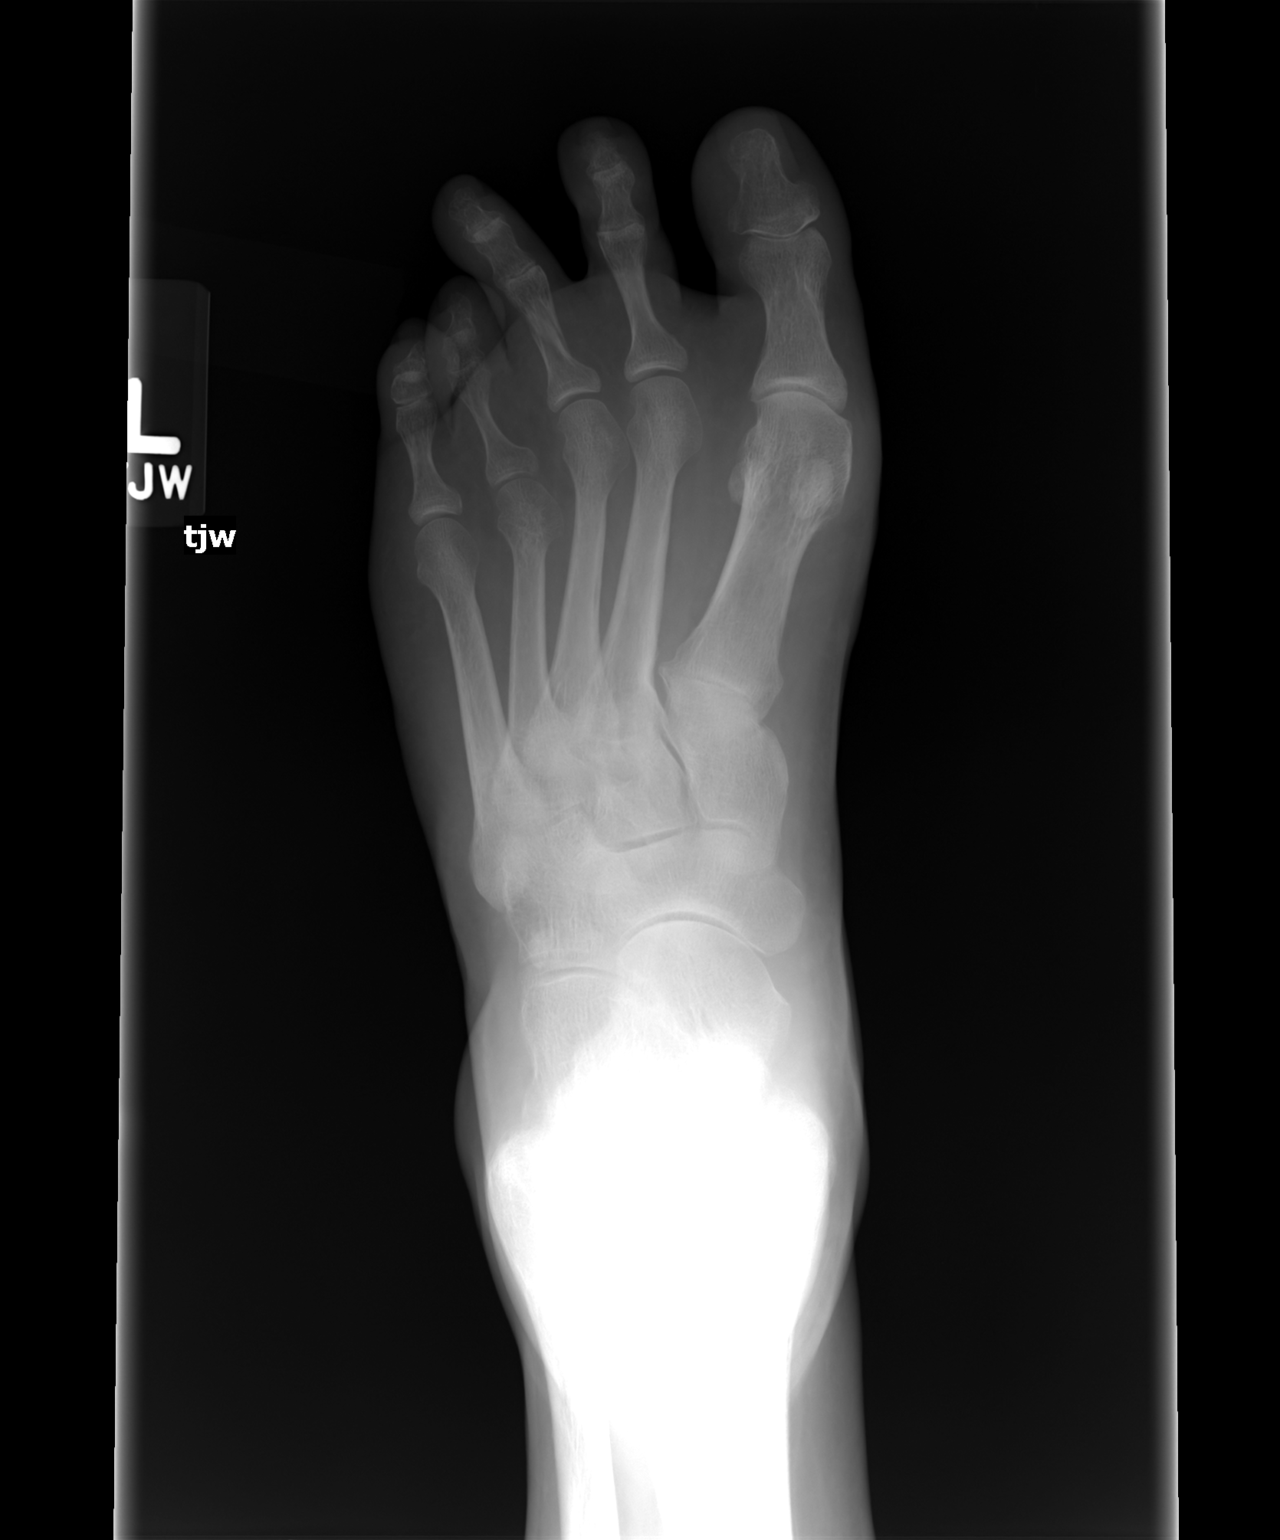

[view not recorded (2 of 3)]
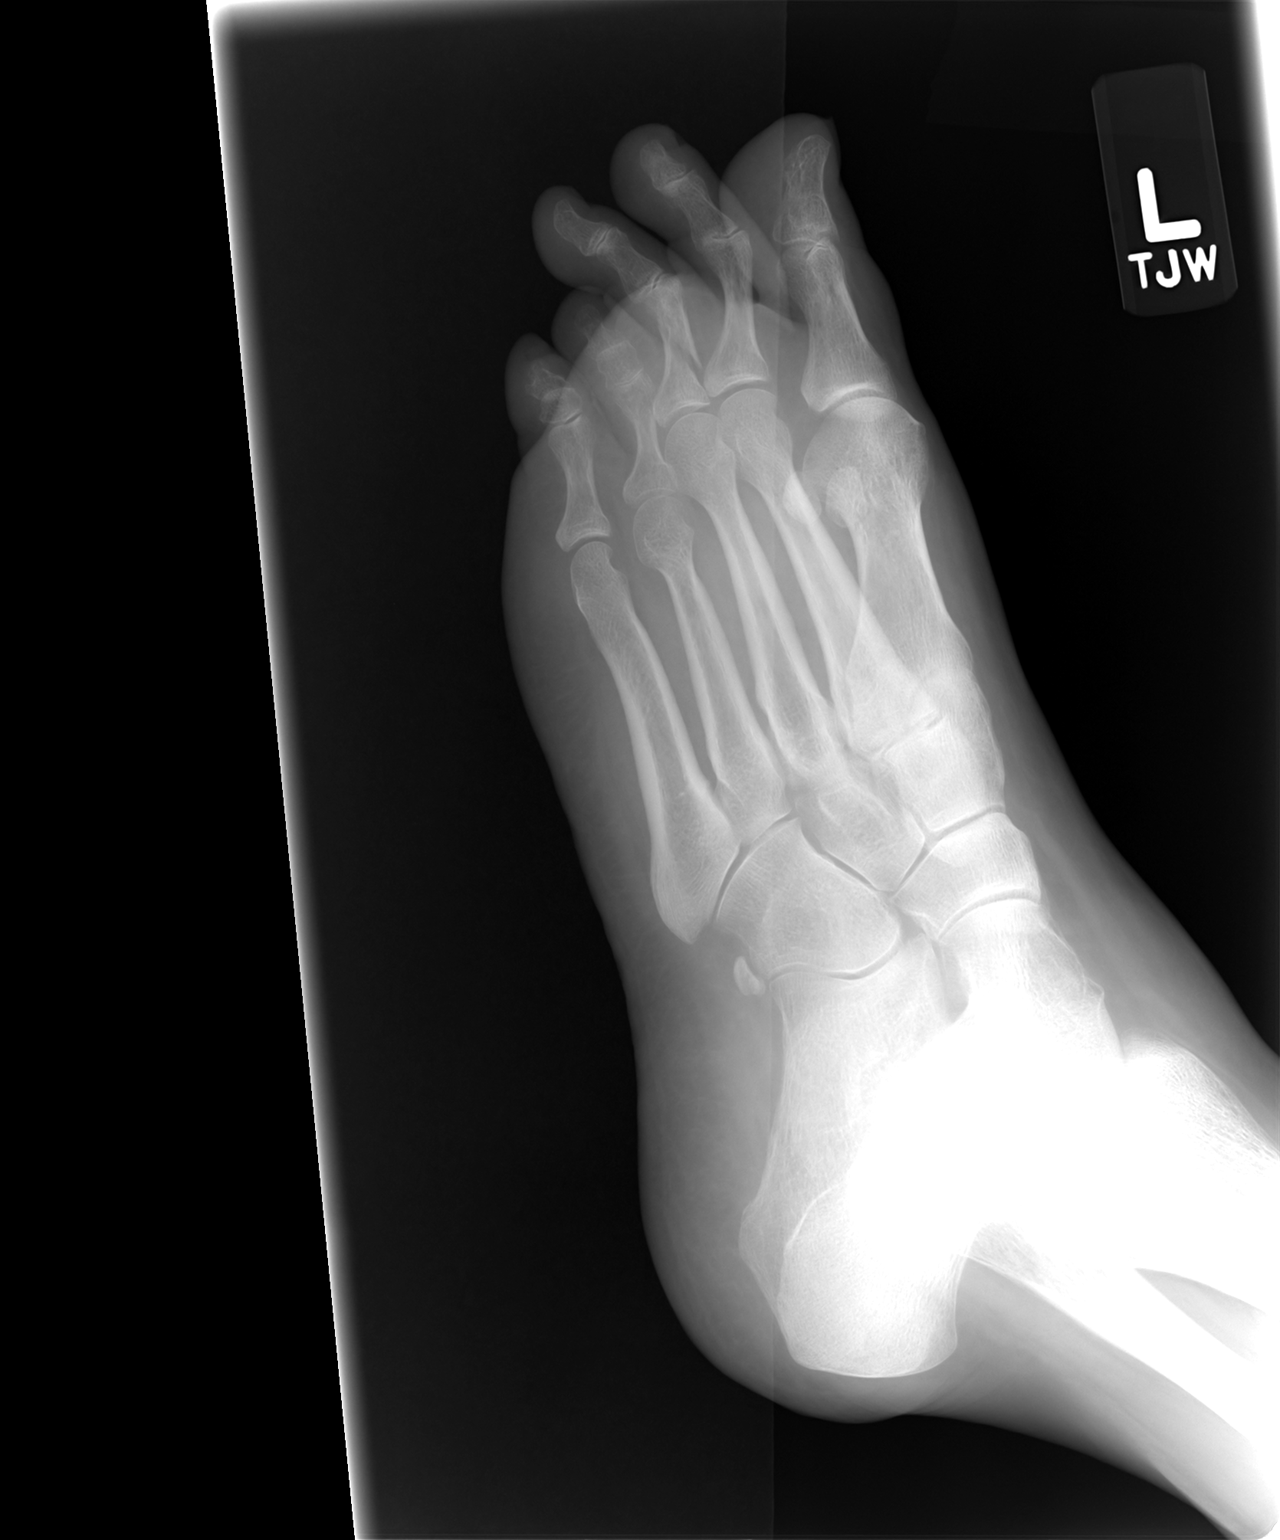

[view not recorded (3 of 3)]
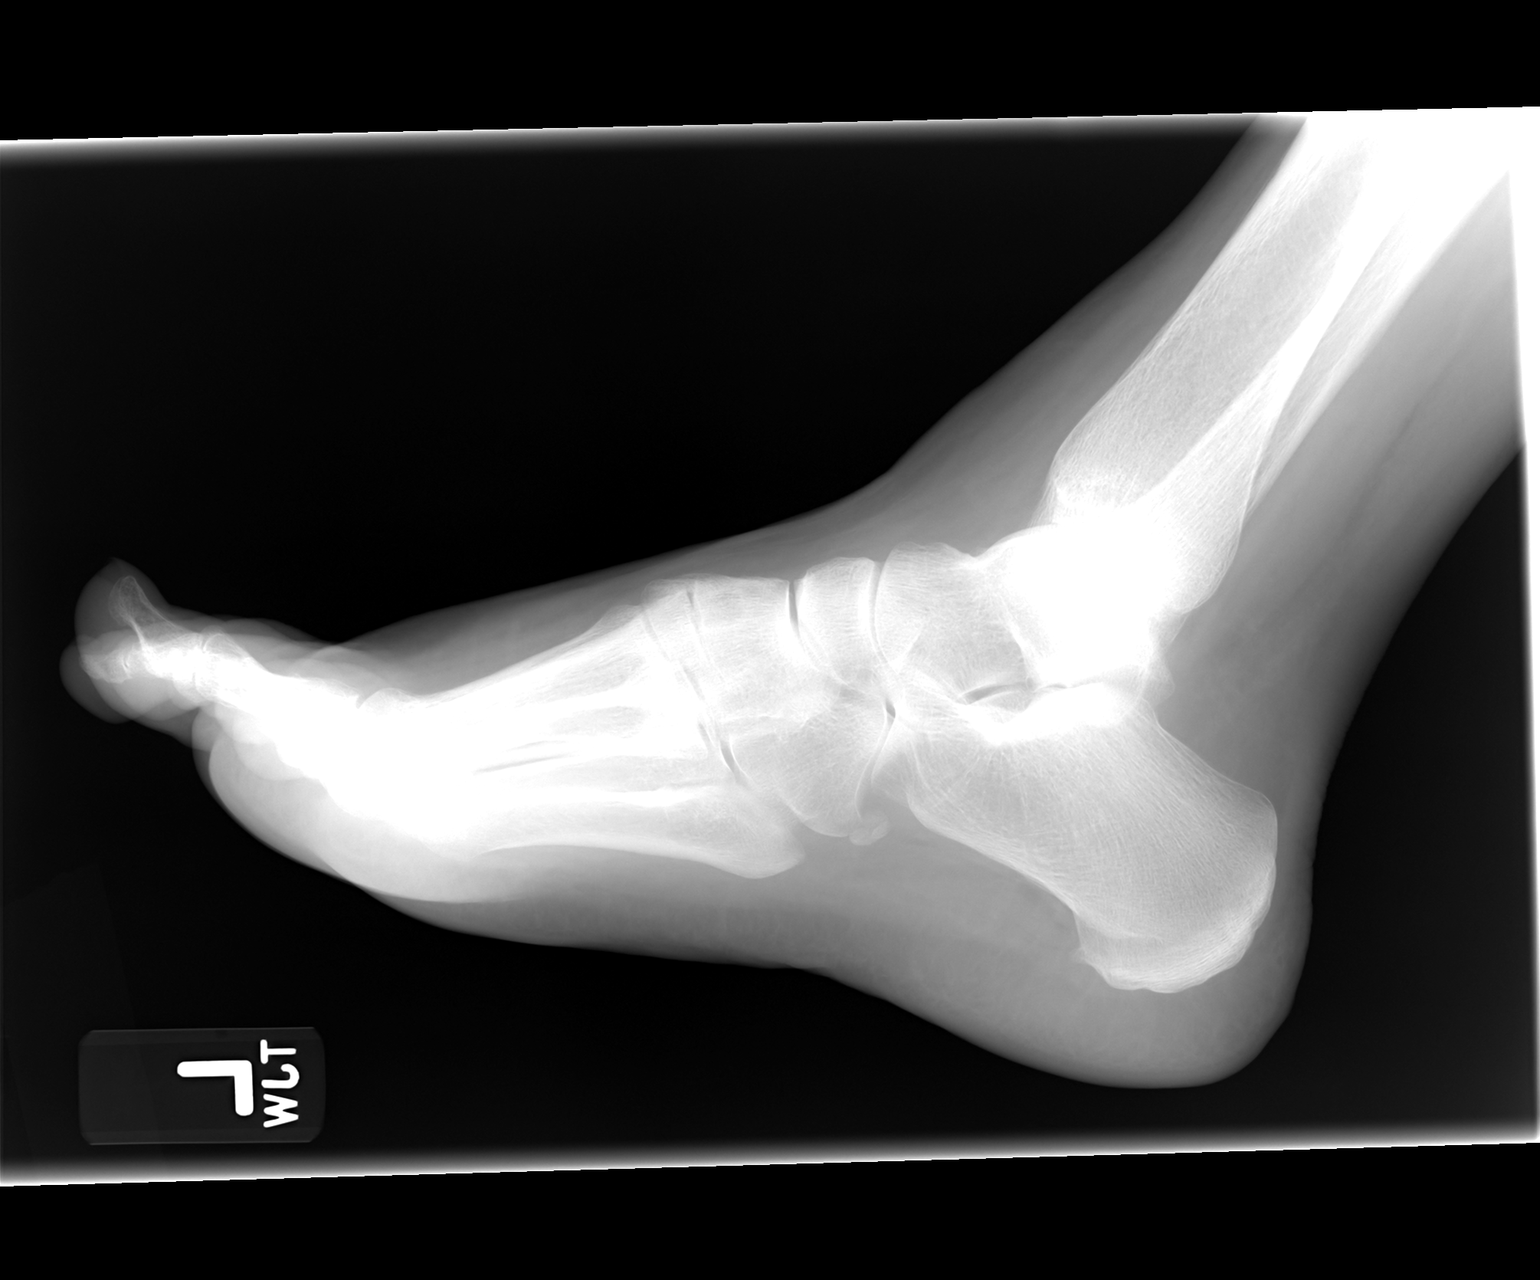

[3 of 3 positions shown; findings below may reference images not displayed]

FINDINGS: There is a fracture through the left third proximal
phalanx.  Slight displacement on the oblique view.  No definite
intra-articular extension.  No additional fracture.  No subluxation
or dislocation.
IMPRESSION: Minimally-displaced left third proximal phalangeal fracture.

## 2012-04-22 NOTE — Telephone Encounter (Signed)
Patient Information:  Caller Name: Autymn  Phone: [phone]  Patient: Elizabeth Curtis, Elizabeth Curtis  Gender: Female  DOB: 1951-01-23  Age: 62 Years  PCP: Eustaquio Boyden [facility])  Office Follow Up:  Does the office need to follow up with this patient?: No  Instructions For The Office: N/A   Symptoms  Reason For Call & Symptoms: Patient states she walked into a box last night. Her left foot struck the box injuring the 3rd toe.  She states the toe is now crooked, slightly swollen with some brusing and painful.  Reviewed Health History In EMR: Yes  Reviewed Medications In EMR: Yes  Reviewed Allergies In EMR: Yes  Reviewed Surgeries / Procedures: No  Date of Onset of Symptoms: 04/21/2012  Treatments Tried: Elevated  Treatments Tried Worked: No  Guideline(s) Used:  Toe Pain  Toe Injury  Disposition Per Guideline:   Go to ED Now (or to Office with PCP Approval)  Reason For Disposition Reached:   Looks like a broken bone or dislocated joint (e.g., crooked or deformed)  Advice Given:  Apply a Cold Pack:  Apply a cold pack or an ice bag (wrapped in a moist towel) to the area for 20 minutes. Repeat in 1 hour, then every 4 hours while awake.  Continue this for the first 48 hours after an injury.  This will help decrease pain and swelling.  Elevate the Foot:  When you are sitting down reading or watching TV, place your foot up on a pillow.  This can also help decrease swelling and pain.  Protect the Toe:  Wear a comfortable pair of shoes that do not rub on the injured toe.  Call Back If:  You become worse.  Call Back If:  Pain becomes severe  Pain does not improve after 3 days  Pain or swelling lasts more than 2 weeks  You become worse.  Patient Will Follow Care Advice:  YES  Appointment Scheduled:  04/22/2012 15:45:00 Appointment Scheduled Provider:  Ruthe Mannan [facility])  Approval by office to send in for evaluation

## 2012-04-22 NOTE — Patient Instructions (Addendum)
Toe Fracture Your caregiver has diagnosed you as having a fractured toe. A toe fracture is a break in the bone of a toe. "Buddy taping" is a way of splinting your broken toe, by taping the broken toe to the toe next to it. This "buddy taping" will keep the injured toe from moving beyond normal range of motion. Buddy taping also helps the toe heal in a more normal alignment. It may take 6 to 8 weeks for the toe injury to heal. HOME CARE INSTRUCTIONS   Leave your toes taped together for as long as directed by your caregiver or until you see a doctor for a follow-up examination. You can change the tape after bathing. Always use a small piece of gauze or cotton between the toes when taping them together. This will help the skin stay dry and prevent infection.  Apply ice to the injury for 15 to 20 minutes each hour while awake for the first 2 days. Put the ice in a plastic bag and place a towel between the bag of ice and your skin.  After the first 2 days, apply heat to the injured area. Use heat for the next 2 to 3 days. Place a heating pad on the foot or soak the foot in warm water as directed by your caregiver.  Keep your foot elevated as much as possible to lessen swelling.  Wear sturdy, supportive shoes. The shoes should not pinch the toes or fit tightly against the toes.  Your caregiver may prescribe a rigid shoe if your foot is very swollen.  Your may be given crutches if the pain is too great and it hurts too much to walk.  Only take over-the-counter or prescription medicines for pain, discomfort, or fever as directed by your caregiver.  If your caregiver has given you a follow-up appointment, it is very important to keep that appointment. Not keeping the appointment could result in a chronic or permanent injury, pain, and disability. If there is any problem keeping the appointment, you must call back to this facility for assistance. SEEK MEDICAL CARE IF:   You have increased  pain or swelling, not relieved with medications.  The pain does not get better after 1 week.  Your injured toe is cold when the others are warm. SEEK IMMEDIATE MEDICAL CARE IF:   The toe becomes cold, numb, or white.  The toe becomes hot (inflamed) and red. Document Released: 01/24/2000 Document Revised: 04/20/2011 Document Reviewed: 09/12/2007 [facility] Patient Information 2013 [place], [place].   Nice to meet you. Please follow up with Dr. Reece Agar on Monday.

## 2012-04-22 NOTE — Progress Notes (Addendum)
Subjective:    Patient ID: Elizabeth Curtis, female    DOB: [birth year], 62 y.o.   MRN: [id number]  HPI 62 yo pt of Dr. Reece Agar new to me here for: 1. ?broken toe- stepped into a box last night and stubbed her toe.  Now her third and fourth toes painful and 3rd one is a little crooked. She can walk.  It is a little tender.   No laceration to skin.  2.  Cough, congestion x 3 days.  No fevers.  No chills.    Patient Active Problem List  Diagnosis  . Diabetes type 2, controlled  . DEPRESSION  . Essential hypertension, benign  . SCHATZKI'S RING  . DIVERTICULOSIS OF COLON  . RECTAL PAIN  . SHOULDER PAIN, LEFT  . LOW BACK PAIN, CHRONIC  . DYSPHAGIA UNSPECIFIED  . COLONIC POLYPS, HYPERPLASTIC, HX OF  . Malignant melanoma of abdominal wall  . HLD (hyperlipidemia)  . Healthcare maintenance  . Acute upper respiratory infections of unspecified site  . Injury of toe   Past Medical History  Diagnosis Date  . Lipoma   . Schatzki's ring 07/23/08    EGD dilated O/W normal (Dr. Jarold Motto)  . History of colonic polyps     Hyperplastic  . Plantar wart   . Dysphagia, unspecified   . Diverticulosis of colon (without mention of hemorrhage)   . Chronic low back pain   . Diabetes mellitus   . HLD (hyperlipidemia)   . Essential hypertension, benign   . Disturbance of skin sensation   . Shoulder pain, left   . History of pelvic ultrasound 03/05    Uterus 13 X 7 X 8cm, Fibroid 2.6cm   Past Surgical History  Procedure Laterality Date  . Cesarean section  1982    Breech presentation  . Colonoscopy  2010    [facility], rec rpt 10 yrs  . Esophagogastroduodenoscopy  2010    [facility], dilated schatzki ring  . Breast biopsy  07/2011    rec rpt 6 mo   History  Substance Use Topics  . Smoking status: Never Smoker   . Smokeless tobacco: Never Used  . Alcohol Use: Yes     Comment: occassionally   Family History  Problem Relation Age of Onset  . Diabetes Mother   . Hyperlipidemia Mother   .  Hypertension Mother   . Mental illness Mother     Personality disorder  . Kidney disease Father     failure, (Diaylsis)  . Diabetes Father   . Hypertension Sister   . Diabetes Brother   . Coronary artery disease Brother 40    CAD, hospitalized freq, PTCA  . Gout Brother   . Stroke Paternal Grandfather   . Stroke Maternal Grandmother   . Cancer Maternal Aunt 24    breast  . Cancer Paternal Aunt 87    breast   Allergies  Allergen Reactions  . Statins Other (See Comments)    crestor - arthralgias Pravastatin - depression   Current Outpatient Prescriptions on File Prior to Visit  Medication Sig Dispense Refill  . amLODipine (NORVASC) 5 MG tablet Take 1 tablet (5 mg total) by mouth at bedtime.  90 tablet  3  . b complex vitamins tablet Take 1 tablet by mouth daily.      . Cholecalciferol (VITAMIN D3) 1000 UNITS CHEW Chew 1 tablet by mouth daily.      . Coenzyme Q10 (CO Q 10) 100 MG CAPS Take 1 capsule by mouth daily.      Marland Kitchen  enalapril (VASOTEC) 20 MG tablet Take 1 tablet (20 mg total) by mouth 2 (two) times daily.  180 tablet  3  . fish oil-omega-3 fatty acids 1000 MG capsule Take by mouth daily.       Marland Kitchen glimepiride (AMARYL) 4 MG tablet Take 1/2 by mouth once daily  45 tablet  3  . metFORMIN (GLUCOPHAGE) 1000 MG tablet Take 1/2 by mouth every morning and one every evening  135 tablet  3  . nitrofurantoin (MACRODANTIN) 100 MG capsule Take one tablet by mouth after sex       . OVER THE COUNTER MEDICATION 1 tablet daily. [employer] Ultra Mega Green Women's 50 Plus      . Palm Oil OIL by Does not apply route as directed.       No current facility-administered medications on file prior to visit.   The PMH, PSH, Social History, Family History, Medications, and allergies have been reviewed in [facility], and have been updated if relevant.   Review of Systems See HPI    Objective:   Physical Exam  Constitutional: She appears well-developed and well-nourished.  HENT:  Nose: Rhinorrhea present.  Right sinus exhibits no maxillary sinus tenderness and no frontal sinus tenderness. Left sinus exhibits no maxillary sinus tenderness and no frontal sinus tenderness.  Mouth/Throat: No oropharyngeal exudate.  Eyes: Pupils are equal, round, and reactive to light.  Musculoskeletal:  Left foot: Echymosis over 3rd and 4th toes, TTP over site of echymosis, mild deviation of 3rd toe to left but able to hold in place   BP 162/90  Pulse 68  Temp(Src) 98.7 F (37.1 C)  Wt 180 lb (81.647 kg)  BMI 26.57 kg/m2        Assessment & Plan:  1. Acute upper respiratory infections of unspecified site Likely viral. Advised supportive care.  She will call office if symptoms do not improve in next 5-7 days.   2. Injury of toe, unspecified laterality, initial encounter Probable fracture of both toes.  Stat xray.  Does not look like displaced on exam so probably does not need to be reduced.   If no angulated or displaced fracture, advise buddy tapping, supportive care and follow up with Dr. Reece Agar on Monday. The patient indicates understanding of these issues and agrees with the plan.  - DG Foot Complete Left; Future

## 2012-04-25 ENCOUNTER — Ambulatory Visit: Payer: 59 | Admitting: Family Medicine

## 2012-05-02 MED ORDER — GUAIFENESIN-CODEINE 100-10 MG/5ML PO SYRP: 5.0000 mL | ORAL_SOLUTION | Freq: Two times a day (BID) | ORAL | Status: DC | PRN

## 2012-05-02 NOTE — Telephone Encounter (Signed)
Patient advised. Medication phoned to pharmacy.  

## 2012-05-02 NOTE — Telephone Encounter (Signed)
plz phone in cheratussin and then notify pt codeine cough syrup called in. If not better with this, to f/u with [place]. Also will route to provider who saw her in case any change in plan.

## 2012-05-02 NOTE — Telephone Encounter (Signed)
Patient Information:  Caller Name: [name]  Phone: [phone]  Patient: Elizabeth Curtis, Elizabeth Curtis  Gender: Female  DOB: 18-Jun-1950  Age: 62 Years  PCP: Eustaquio Boyden [facility])  Office Follow Up:  Does the office need to follow up with this patient?: Yes  Instructions For The Office: asking for medication   Symptoms  Reason For Call & Symptoms: Patient calling, has a cough that is productive for over a week.  Having problems sleeping due to same.  The mucous is white/yellow/green.  No fever at any time.  Was seen 3/14 for viral sx and a fx toe.  Reviewed Health History In EMR: Yes  Reviewed Medications In EMR: Yes  Reviewed Allergies In EMR: Yes  Reviewed Surgeries / Procedures: Yes  Date of Onset of Symptoms: 04/22/2012  Treatments Tried: Zycam Cough, Robitussin DM  Treatments Tried Worked: No  Guideline(s) Used:  Cough  Disposition Per Guideline:   See Today or Tomorrow in Office  Reason For Disposition Reached:   Continuous (nonstop) coughing interferes with work or school and no improvement using cough treatment per Care Advice  Advice Given:  N/A  Patient Refused Recommendation:  Patient Requests Prescription  Asking for a cough medication if possible. [facility] in [place]

## 2012-06-21 ENCOUNTER — Encounter: Payer: Self-pay | Admitting: *Deleted

## 2012-06-21 DIAGNOSIS — R920 Mammographic microcalcification found on diagnostic imaging of breast: Secondary | ICD-10-CM | POA: Insufficient documentation

## 2012-08-11 ENCOUNTER — Encounter: Payer: Self-pay | Admitting: General Surgery

## 2012-08-11 ENCOUNTER — Ambulatory Visit: Payer: Self-pay | Admitting: General Surgery

## 2012-08-11 IMAGING — MG MM CAD SCREENING MAMMO
1 series · 5 of 5 positions shown · non-contrast
Comparison: [DATE], [DATE], [DATE], [DATE], [DATE], mammograms
from [facility] [DATE].

REASON FOR EXAM: SCR MAMMO NO ORDER
COMMENTS:

PROCEDURE:     MAM - MAM DGTL SCRN MAM NO ORDER W/CAD  - [DATE]  [DATE]
RESULT:

[R CC · right · 5 of 5 slices shown]
[im 1/5]
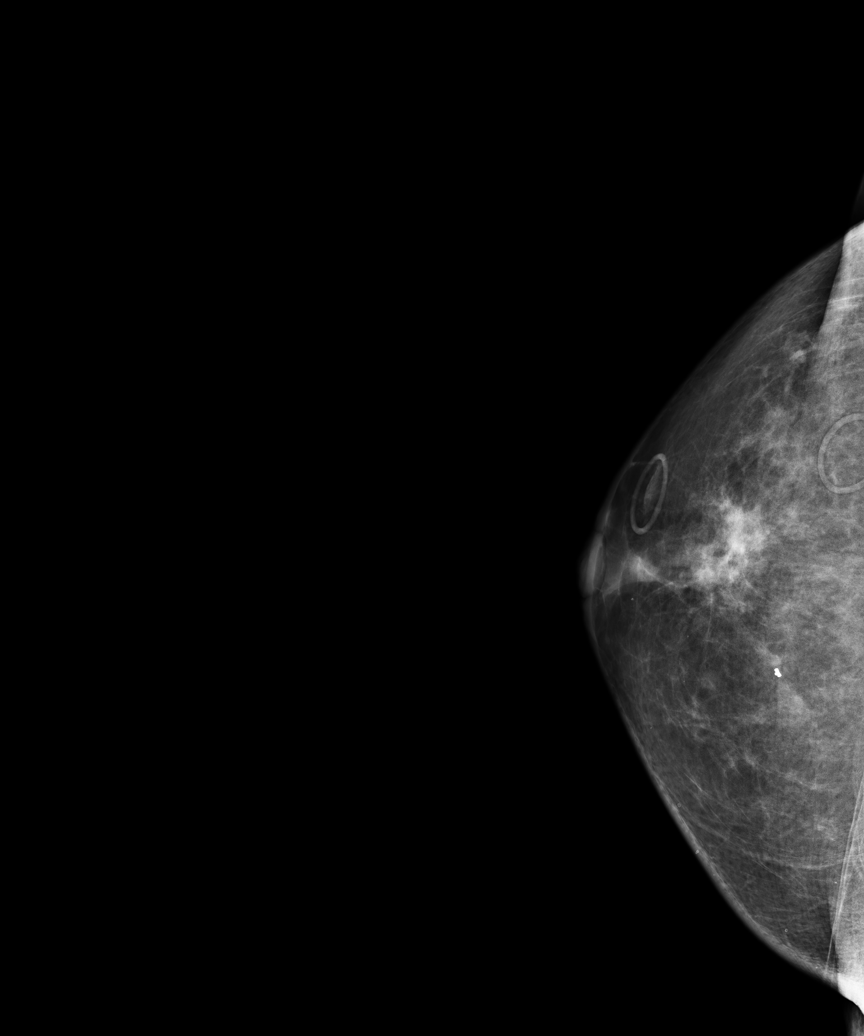
[im 2/5]
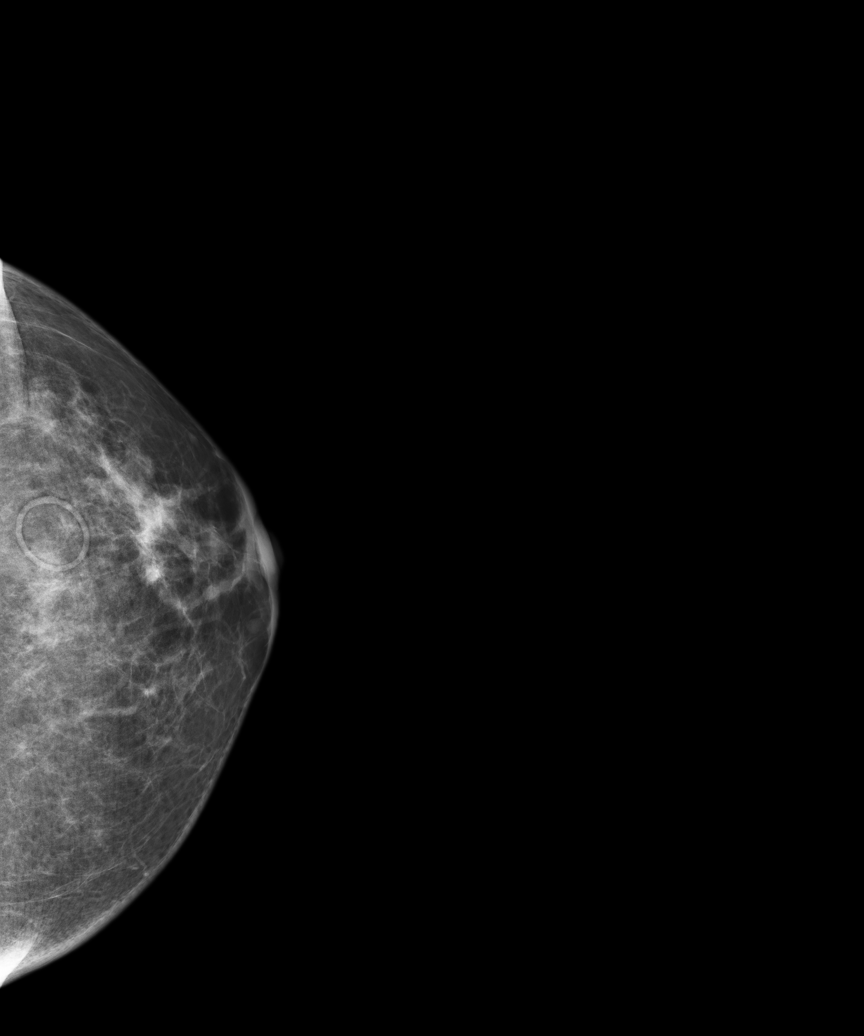
[im 3/5]
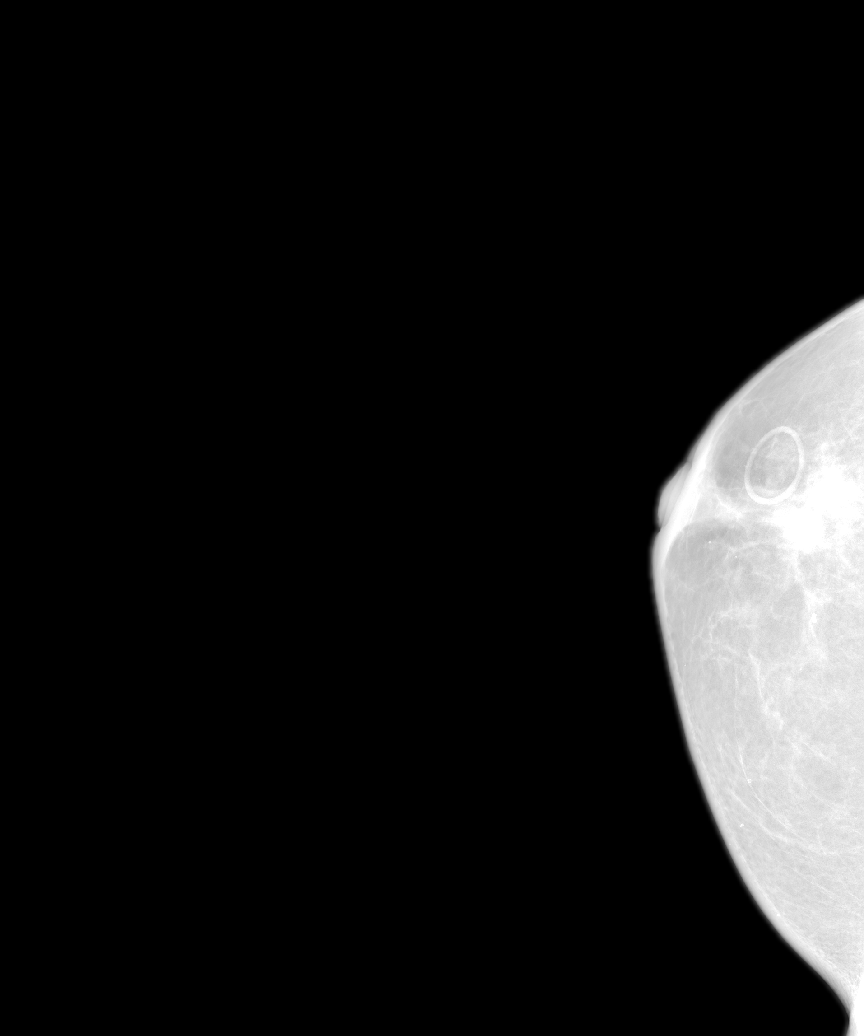
[im 4/5]
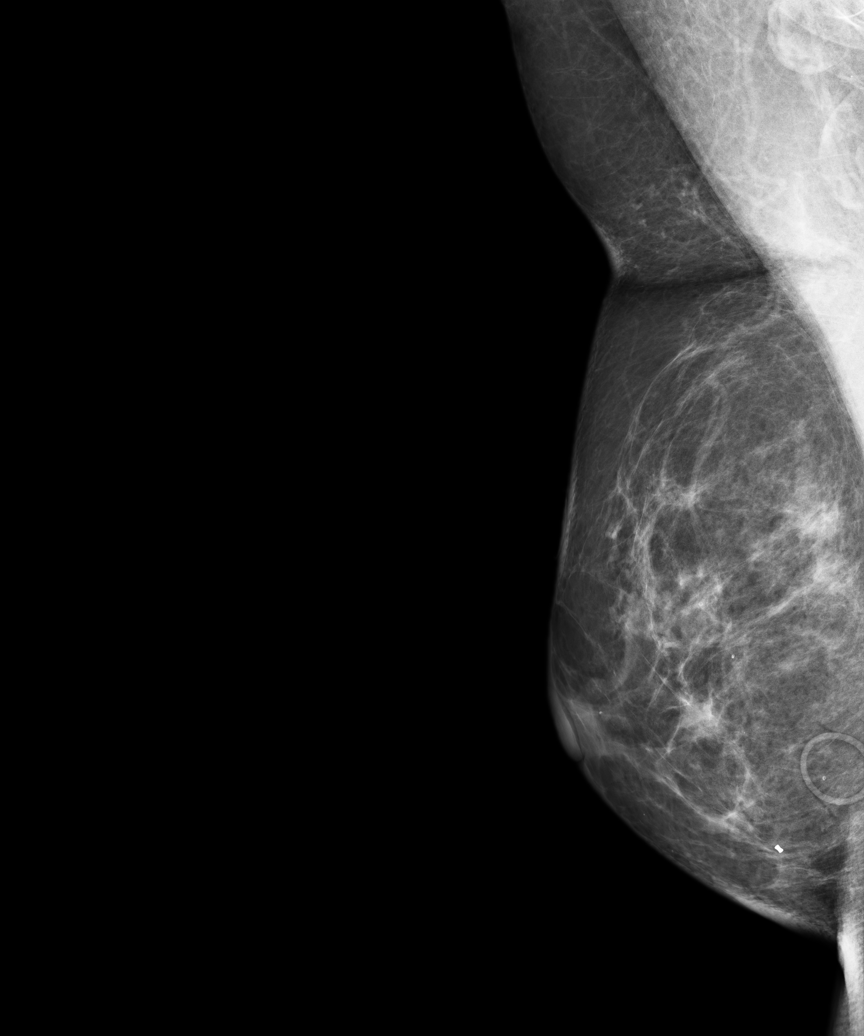
[im 5/5]
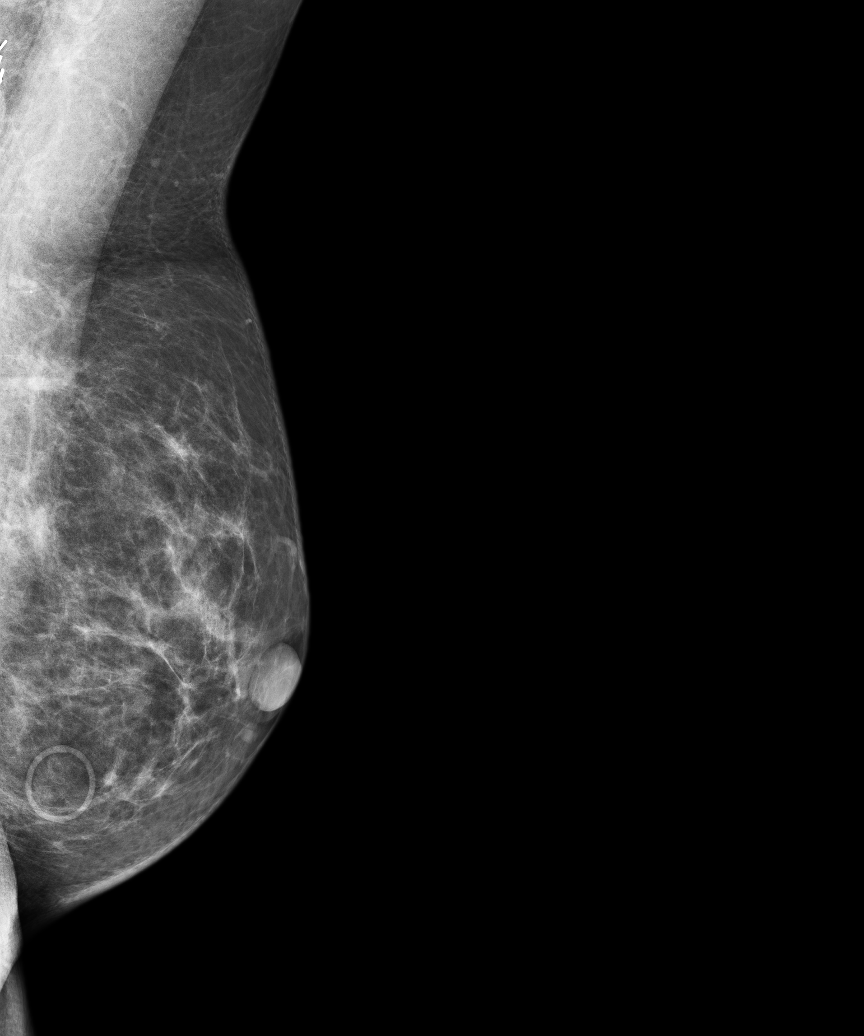

[5 of 5 positions shown; findings below may reference images not displayed]

FINDINGS: There is scattered fibroglandular tissue. A biopsy clip marker is present in
the inferior medial aspect of the right breast. No suspicious masses or
calcifications are identified. No areas of architectural distortion.
IMPRESSION: 1.     BI-RADS: Category 2 - Benign Findings.
2.     Recommend continued annual screening mammography.

BREAST COMPOSITION: The breast composition is SCATTERED FIBROGLANDULAR
TISSUE (glandular tissue is 25-50%).

[REDACTED]

Thank you for this opportunity to contribute to the care of your patient.

A NEGATIVE MAMMOGRAM REPORT DOES NOT PRECLUDE BIOPSY OR OTHER EVALUATION OF
A CLINICALLY PALPABLE OR OTHERWISE SUSPICIOUS MASS OR LESION. BREAST CANCER
MAY NOT BE DETECTED BY MAMMOGRAPHY IN UP TO 10% OF CASES.

## 2012-08-18 ENCOUNTER — Other Ambulatory Visit: Payer: Self-pay

## 2012-08-25 ENCOUNTER — Ambulatory Visit: Payer: Self-pay | Admitting: General Surgery

## 2012-09-06 ENCOUNTER — Ambulatory Visit: Payer: Self-pay | Admitting: General Surgery

## 2012-09-15 ENCOUNTER — Encounter: Payer: Self-pay | Admitting: Family Medicine

## 2012-09-16 ENCOUNTER — Other Ambulatory Visit: Payer: Self-pay | Admitting: Family Medicine

## 2012-09-16 ENCOUNTER — Encounter: Payer: Self-pay | Admitting: Family Medicine

## 2012-09-16 ENCOUNTER — Other Ambulatory Visit (INDEPENDENT_AMBULATORY_CARE_PROVIDER_SITE_OTHER): Payer: 59

## 2012-09-16 DIAGNOSIS — E785 Hyperlipidemia, unspecified: Secondary | ICD-10-CM

## 2012-09-16 DIAGNOSIS — E119 Type 2 diabetes mellitus without complications: Secondary | ICD-10-CM

## 2012-09-16 DIAGNOSIS — I10 Essential (primary) hypertension: Secondary | ICD-10-CM

## 2012-09-16 LAB — BASIC METABOLIC PANEL
Chloride: 103 mEq/L (ref 96–112)
GFR: 84.33 mL/min (ref >60.00)
Potassium: 3.9 mEq/L (ref 3.5–5.1)
Sodium: 140 mEq/L (ref 135–145)

## 2012-09-16 LAB — MICROALBUMIN / CREATININE URINE RATIO
Microalb Creat Ratio: 1.3 mg/g (ref 0.0–30.0)
Microalb, Ur: 2.5 mg/dL — ABNORMAL HIGH (ref 0.0–1.9)

## 2012-09-16 LAB — LDL CHOLESTEROL, DIRECT: Direct LDL: 146.3 mg/dL

## 2012-09-16 LAB — LIPID PANEL
Total CHOL/HDL Ratio: 6
VLDL: 43 mg/dL — ABNORMAL HIGH (ref 0.0–40.0)

## 2012-09-16 LAB — HEMOGLOBIN A1C: Hgb A1c MFr Bld: 7.2 % — ABNORMAL HIGH (ref 4.6–6.5)

## 2012-09-20 ENCOUNTER — Encounter: Payer: Self-pay | Admitting: Family Medicine

## 2012-09-21 ENCOUNTER — Telehealth: Payer: Self-pay

## 2012-09-21 NOTE — Telephone Encounter (Signed)
Sent via mychart

## 2012-09-21 NOTE — Telephone Encounter (Signed)
Pt wants to review lab results prior to pt coming in to office for CPX on 09/23/12. Pt request labs be released so pt can look at them in [place].Pt request cb when she can review labs.

## 2012-09-22 ENCOUNTER — Encounter: Payer: Self-pay | Admitting: General Surgery

## 2012-09-22 ENCOUNTER — Ambulatory Visit (INDEPENDENT_AMBULATORY_CARE_PROVIDER_SITE_OTHER): Payer: 59 | Admitting: General Surgery

## 2012-09-22 VITALS — BP 152/80 | HR 68 | Resp 12 | Ht 69.0 in | Wt 179.0 lb

## 2012-09-22 DIAGNOSIS — Z1239 Encounter for other screening for malignant neoplasm of breast: Secondary | ICD-10-CM

## 2012-09-22 DIAGNOSIS — N6019 Diffuse cystic mastopathy of unspecified breast: Secondary | ICD-10-CM

## 2012-09-22 DIAGNOSIS — N6011 Diffuse cystic mastopathy of right breast: Secondary | ICD-10-CM

## 2012-09-22 NOTE — Patient Instructions (Addendum)
Patient to return as needed. Advised to continue regular self breast checks and get regular mammograms done through Dr. Janene Harvey.

## 2012-09-22 NOTE — Progress Notes (Signed)
Patient ID: Elizabeth Curtis, female   DOB: 01-04-1951, 62 y.o.   MRN: [id number]  Chief Complaint  Patient presents with  . Follow-up    6 month follow up mammogram    HPI Elizabeth Curtis is a 62 y.o. female who presents for a 6 month follow up breast evaluation. She is 1 year post right breast stereo biopsy - FCD.The most recent mammogram was done on 7/3//14 with a birad category 2. The patient denies any problems with the breasts at this time. She does get regular mammograms.  HPI  Past Medical History  Diagnosis Date  . Lipoma   . Schatzki's ring 07/23/08    EGD dilated O/W normal (Dr. Jarold Motto)  . History of colonic polyps     Hyperplastic  . Plantar wart   . Dysphagia, unspecified(787.20)   . Diverticulosis of colon (without mention of hemorrhage)   . Chronic low back pain   . Diabetes mellitus   . HLD (hyperlipidemia)   . Disturbance of skin sensation   . Shoulder pain, left   . History of pelvic ultrasound 03/05    Uterus 13 X 7 X 8cm, Fibroid 2.6cm  . Essential hypertension, benign 1995  . Diffuse cystic mastopathy   . H/O cystitis 2011  . Mammographic microcalcification 2013    Past Surgical History  Procedure Laterality Date  . Cesarean section  1982    Breech presentation  . Colonoscopy  2010    Patterson, rec rpt 10 yrs  . Esophagogastroduodenoscopy  2010    [place], dilated schatzki ring  . Breast biopsy  07/2011    rec rpt 6 mo  . Melanoma excision  2012    upper abdomen  . Eye surgery Left 2012    cataract    Family History  Problem Relation Age of Onset  . Diabetes Mother   . Hyperlipidemia Mother   . Hypertension Mother   . Mental illness Mother     Personality disorder  . Kidney disease Father     failure, (Diaylsis)  . Diabetes Father   . Hypertension Sister   . Diabetes Brother   . Coronary artery disease Brother 40    CAD, hospitalized freq, PTCA  . Gout Brother   . Stroke Paternal Grandfather   . Stroke Maternal Grandmother    . Cancer Maternal Aunt 64    breast  . Cancer Paternal Aunt 72    breast    Social History History  Substance Use Topics  . Smoking status: Never Smoker   . Smokeless tobacco: Never Used  . Alcohol Use: Yes     Comment: occassionally    Allergies  Allergen Reactions  . Other Other (See Comments)    Steri Strips - blood blisters and skin irritation.  Mastasol glue - blood blisters and skin irritation.   . Statins Other (See Comments)    crestor - arthralgias Pravastatin - depression    Current Outpatient Prescriptions  Medication Sig Dispense Refill  . amLODipine (NORVASC) 5 MG tablet Take 1 tablet (5 mg total) by mouth at bedtime.  90 tablet  3  . Cholecalciferol (VITAMIN D3) 1000 UNITS CHEW Chew 1 tablet by mouth daily.      . Cyanocobalamin (VITAMIN B 12 PO) Take 2,500 mcg by mouth daily.      . Docosahexaenoic Acid (DHA PO) Take 500 mg by mouth daily.      . enalapril (VASOTEC) 20 MG tablet Take 1 tablet (20 mg total) by mouth  2 (two) times daily.  180 tablet  3  . fish oil-omega-3 fatty acids 1000 MG capsule Take by mouth daily.       Marland Kitchen glimepiride (AMARYL) 4 MG tablet Take 1/2 by mouth once daily  45 tablet  3  . metFORMIN (GLUCOPHAGE) 1000 MG tablet Take 1/2 by mouth every morning and one every evening  135 tablet  3  . Multiple Vitamin (MULTIVITAMIN) tablet Take 1 tablet by mouth daily.      Marland Kitchen Ubiquinol 100 MG CAPS Take 1 capsule by mouth daily.       No current facility-administered medications for this visit.    Review of Systems Review of Systems  Constitutional: Negative.   Respiratory: Negative.   Cardiovascular: Negative.     Blood pressure 152/80, pulse 68, resp. rate 12, height 5\' 9"  (1.753 m), weight 179 lb (81.194 kg).  Physical Exam Physical Exam  Constitutional: She is oriented to person, place, and time. She appears well-developed and well-nourished.  Eyes: Conjunctivae are normal. No scleral icterus.  Neck: No thyromegaly present.   Cardiovascular: Normal rate, regular rhythm and normal heart sounds.   No murmur heard. Pulmonary/Chest: Effort normal and breath sounds normal. Right breast exhibits no inverted nipple, no mass, no nipple discharge, no skin change and no tenderness. Left breast exhibits no inverted nipple, no mass, no nipple discharge, no skin change and no tenderness.  Lymphadenopathy:    She has no cervical adenopathy.    She has no axillary adenopathy.  Neurological: She is alert and oriented to person, place, and time.  Skin: Skin is warm and dry.    Data Reviewed  Mammogram reviewed. No changes.  Assessment    Exam Stable. No ongoing issues with her breasts    Plan    Patient to return as needed. Patient advised to continue self breast checks and get regular mammograms through Dr. Janene Harvey.        Janson Lamar G 09/22/2012, 10:03 AM

## 2012-09-23 ENCOUNTER — Encounter: Payer: Self-pay | Admitting: Family Medicine

## 2012-09-23 ENCOUNTER — Ambulatory Visit (INDEPENDENT_AMBULATORY_CARE_PROVIDER_SITE_OTHER): Payer: 59 | Admitting: Family Medicine

## 2012-09-23 VITALS — BP 180/100 | HR 88 | Temp 98.3°F | Ht 69.0 in | Wt 178.5 lb

## 2012-09-23 DIAGNOSIS — I10 Essential (primary) hypertension: Secondary | ICD-10-CM

## 2012-09-23 DIAGNOSIS — C4359 Malignant melanoma of other part of trunk: Secondary | ICD-10-CM

## 2012-09-23 DIAGNOSIS — E1165 Type 2 diabetes mellitus with hyperglycemia: Secondary | ICD-10-CM

## 2012-09-23 DIAGNOSIS — IMO0002 Reserved for concepts with insufficient information to code with codable children: Secondary | ICD-10-CM

## 2012-09-23 DIAGNOSIS — Z0000 Encounter for general adult medical examination without abnormal findings: Secondary | ICD-10-CM

## 2012-09-23 DIAGNOSIS — E785 Hyperlipidemia, unspecified: Secondary | ICD-10-CM

## 2012-09-23 DIAGNOSIS — IMO0001 Reserved for inherently not codable concepts without codable children: Secondary | ICD-10-CM

## 2012-09-23 MED ORDER — METFORMIN HCL 1000 MG PO TABS: ORAL_TABLET | ORAL | Status: DC

## 2012-09-23 MED ORDER — ENALAPRIL MALEATE 20 MG PO TABS: 20.0000 mg | ORAL_TABLET | Freq: Two times a day (BID) | ORAL | Status: DC

## 2012-09-23 MED ORDER — AMLODIPINE BESYLATE 10 MG PO TABS: 10.0000 mg | ORAL_TABLET | Freq: Every day | ORAL | Status: DC

## 2012-09-23 MED ORDER — GLIMEPIRIDE 4 MG PO TABS: ORAL_TABLET | ORAL | Status: DC

## 2012-09-23 NOTE — Assessment & Plan Note (Signed)
Chronic, uncontrolled as evidenced by today's readings and yesterday's reading at surgeon's office. Continue meds, increase amlodipine to 10mg  daily.

## 2012-09-23 NOTE — Assessment & Plan Note (Signed)
Sees derm Q6 mo 

## 2012-09-23 NOTE — Assessment & Plan Note (Signed)
Reviewed #s with patient. She has not been adherent to diabetic diet - aware of this. Pt motivated to work on healthier low carb diet and increase regular activity. Foot exam today.  UTD eye exam. Return in 3 mo for f/u. Lab Results  Component Value Date   HGBA1C 7.2* 09/16/2012  no changes to meds today.

## 2012-09-23 NOTE — Assessment & Plan Note (Signed)
Chronic, LDL not at goal <100 given h/o DM. Intolerant of statins in past.  Agrees to trial of RYR.  Will notify me if arthralgias develop. If unable to tolerate, consider zetia vs other

## 2012-09-23 NOTE — Assessment & Plan Note (Signed)
Preventative protocols reviewed and updated unless pt declined. Discussed healthy diet and lifestyle.  Reviewed records she brings from mammo/pap and derm appt

## 2012-09-23 NOTE — Patient Instructions (Addendum)
Call your insurance about the shingles shot to see if it is covered or how much it would cost and where is cheaper (here or pharmacy).  If you want to receive here, call for nurse visit.  Start 1 capsule of red yeast rice daily (600mg ). Let's increase amlodipine to 10mg  daily.  Double up until you receive new dose in mail. Good to see you today, call us with questions. Return in 3 months for diabetes and blood pressure follow up

## 2012-09-23 NOTE — Progress Notes (Signed)
Subjective:    Patient ID: Elizabeth Curtis, female    DOB: [birth year], 62 y.o.   MRN: [id number]  HPI CC: CPE  Wt Readings from Last 3 Encounters:  09/23/12 178 lb 8 oz (80.967 kg)  09/22/12 179 lb (81.194 kg)  04/22/12 180 lb (81.647 kg)   Body mass index is 26.35 kg/(m^2).  Recent trip to [place] - enjoyed cruise.  Lots of traveling recently. Broke left toe 04/2012 - saw ortho and had surgery, placed in cast for 5 wks.  Some dysphagia early 2014, not recently.  H/o schlotzki ring s/p dilation.  No recent EGD.  Will update me if persistent sxs.  DM - sugars running higher than normal.  This am 116.  Checks very intermittently. Foot exam today. Last vision screen was 09/2012, no diabetic retinopathy. No lows. No paresthesias. Lab Results  Component Value Date   HGBA1C 7.2* 09/16/2012     HTN - elevated today - running borderline at home as well (130/80).  HLD - intolerant of statins in past. crestor and pravastatin tried in past.   H/o melanoma - last year upper abdomen s/p excision. Gets regular skin checks every 6 months. Derm - Dr. Adolphus Birchwood in [place].   LMP - 2010. menopausal.   Preventative:  Well woman at [facility], Dr. Janene Harvey.  Last pap smear 07/2012 HPV +. Will rpt pap in 1 year Last mammo 08/2012 Birads2 rec rpt 1 year.   Colonoscopy - 2010 Patterson, 1 polyp, 2mm lipoma, o/w normal, rec rpt 10 yrs.  Tetanus 2010.  Flu shot yearly.  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  Activity: Patient does not get regular exercise, has gym membership  Diet: good water, daily fruits/vegetables  Medications and allergies reviewed and updated in chart.  Past histories reviewed and updated if relevant as below. Patient Active Problem List   Diagnosis Date Noted  . Breast screening 09/22/2012  .  Diffuse cystic mastopathy 09/22/2012  . Mammographic microcalcification   . Acute upper respiratory infections of unspecified site 04/22/2012  . Injury of toe 04/22/2012  . Healthcare maintenance 09/14/2011  . HLD (hyperlipidemia)   . Malignant melanoma of abdominal wall 05/20/2010  . DEPRESSION 11/22/2008  . SCHATZKI'S RING 08/09/2008  . COLONIC POLYPS, HYPERPLASTIC, HX OF 08/09/2008  . RECTAL PAIN 07/10/2008  . DYSPHAGIA UNSPECIFIED 05/09/2008  . Diabetes type 2, controlled 10/27/2007  . Essential hypertension, benign 10/27/2007  . DIVERTICULOSIS OF COLON 05/05/2007  . LOW BACK PAIN, CHRONIC 05/05/2007  . SHOULDER PAIN, LEFT 10/26/2006   Past Medical History  Diagnosis Date  . Lipoma   . Schatzki's ring 07/23/08    EGD dilated O/W normal (Dr. Jarold Motto)  . History of colonic polyps     Hyperplastic  . Plantar wart   . Dysphagia, unspecified(787.20)   . Diverticulosis of colon (without mention of hemorrhage)   . Chronic low back pain   . Diabetes mellitus   . HLD (hyperlipidemia)   . Disturbance of skin sensation   . Shoulder pain, left   . History of pelvic ultrasound 03/05    Uterus 13 X 7 X 8cm, Fibroid 2.6cm  . Essential hypertension, benign 1995  . Diffuse cystic mastopathy   . H/O cystitis 2011  . Mammographic microcalcification 2013   Past Surgical History  Procedure Laterality Date  .  Cesarean section  1982    Breech presentation  . Colonoscopy  2010    Patterson, rec rpt 10 yrs  . Esophagogastroduodenoscopy  2010    [place]terson, dilated schatzki ring  . Breast biopsy  07/2011    rec rpt 6 mo  . Melanoma excision  2012    upper abdomen  . Eye surgery Left 2012    cataract   History  Substance Use Topics  . Smoking status: Never Smoker   . Smokeless tobacco: Never Used  . Alcohol Use: Yes     Comment: occassionally   Family History  Problem Relation Age of Onset  . Diabetes Mother   . Hyperlipidemia Mother   . Hypertension Mother   . Mental  illness Mother     Personality disorder  . Kidney disease Father     failure, (Diaylsis)  . Diabetes Father   . Hypertension Sister   . Diabetes Brother   . Coronary artery disease Brother 40    CAD, hospitalized freq, PTCA  . Gout Brother   . Stroke Paternal Grandfather   . Stroke Maternal Grandmother   . Cancer Maternal Aunt 28    breast  . Cancer Paternal Aunt 74    breast   Allergies  Allergen Reactions  . Other Other (See Comments)    Steri Strips - blood blisters and skin irritation.  Mastasol glue - blood blisters and skin irritation.   . Statins Other (See Comments)    crestor - arthralgias Pravastatin - depression   Current Outpatient Prescriptions on File Prior to Visit  Medication Sig Dispense Refill  . amLODipine (NORVASC) 5 MG tablet Take 1 tablet (5 mg total) by mouth at bedtime.  90 tablet  3  . Cholecalciferol (VITAMIN D3) 1000 UNITS CHEW Chew 1 tablet by mouth daily.      . Cyanocobalamin (VITAMIN B 12 PO) Take 2,500 mcg by mouth daily.      . Docosahexaenoic Acid (DHA PO) Take 500 mg by mouth daily.      . enalapril (VASOTEC) 20 MG tablet Take 1 tablet (20 mg total) by mouth 2 (two) times daily.  180 tablet  3  . fish oil-omega-3 fatty acids 1000 MG capsule Take by mouth daily.       Marland Kitchen glimepiride (AMARYL) 4 MG tablet Take 1/2 by mouth once daily  45 tablet  3  . metFORMIN (GLUCOPHAGE) 1000 MG tablet Take 1/2 by mouth every morning and one every evening  135 tablet  3  . Multiple Vitamin (MULTIVITAMIN) tablet Take 1 tablet by mouth daily.      Marland Kitchen Ubiquinol 100 MG CAPS Take 1 capsule by mouth daily.       No current facility-administered medications on file prior to visit.     Review of Systems  Constitutional: Negative for fever, chills, activity change, appetite change, fatigue and unexpected weight change.  HENT: Negative for hearing loss and neck pain.   Eyes: Negative for visual disturbance.  Respiratory: Negative for cough, chest tightness,  shortness of breath and wheezing.   Cardiovascular: Negative for chest pain, palpitations and leg swelling.  Gastrointestinal: Negative for nausea, vomiting, abdominal pain, diarrhea, constipation, blood in stool and abdominal distention.  Genitourinary: Negative for hematuria and difficulty urinating.  Musculoskeletal: Negative for myalgias and arthralgias.  Skin: Negative for rash.  Neurological: Negative for dizziness, seizures, syncope and headaches.  Hematological: Negative for adenopathy. Does not bruise/bleed easily.  Psychiatric/Behavioral: Negative for dysphoric mood. The patient is not  nervous/anxious.        Objective:   Physical Exam  Nursing note and vitals reviewed. Constitutional: She is oriented to person, place, and time. She appears well-developed and well-nourished. No distress.  HENT:  Head: Normocephalic and atraumatic.  Right Ear: Hearing, tympanic membrane, external ear and ear canal normal.  Left Ear: Hearing, tympanic membrane, external ear and ear canal normal.  Nose: Nose normal.  Mouth/Throat: Oropharynx is clear and moist. No oropharyngeal exudate.  Eyes: Conjunctivae and EOM are normal. Pupils are equal, round, and reactive to light. No scleral icterus.  Neck: Normal range of motion. Neck supple. No thyromegaly present.  Cardiovascular: Normal rate, regular rhythm, normal heart sounds and intact distal pulses.   No murmur heard. Pulses:      Radial pulses are 2+ on the right side, and 2+ on the left side.  Pulmonary/Chest: Effort normal and breath sounds normal. No respiratory distress. She has no wheezes. She has no rales.  Abdominal: Soft. Bowel sounds are normal. She exhibits no distension and no mass. There is no tenderness. There is no rebound and no guarding.  Musculoskeletal: Normal range of motion. She exhibits no edema.  Diabetic foot exam: Normal inspection No skin breakdown No calluses  Normal DP/PT pulses Normal sensation to light tough  and monofilament Nails normal   Lymphadenopathy:    She has no cervical adenopathy.  Neurological: She is alert and oriented to person, place, and time.  CN grossly intact, station and gait intact  Skin: Skin is warm and dry. No rash noted.  Psychiatric: She has a normal mood and affect. Her behavior is normal. Judgment and thought content normal.       Assessment & Plan:

## 2012-11-30 ENCOUNTER — Encounter: Payer: Self-pay | Admitting: Family Medicine

## 2012-11-30 DIAGNOSIS — E785 Hyperlipidemia, unspecified: Secondary | ICD-10-CM

## 2012-11-30 NOTE — Telephone Encounter (Signed)
Please see Mychart message.

## 2012-12-01 NOTE — Telephone Encounter (Signed)
plz call pt to schedule lab visit this week.  Thanks.

## 2012-12-02 NOTE — Telephone Encounter (Signed)
Appt scheduled

## 2012-12-05 ENCOUNTER — Other Ambulatory Visit (INDEPENDENT_AMBULATORY_CARE_PROVIDER_SITE_OTHER): Payer: 59

## 2012-12-05 DIAGNOSIS — E785 Hyperlipidemia, unspecified: Secondary | ICD-10-CM

## 2012-12-05 LAB — LIPID PANEL
Cholesterol: 206 mg/dL — ABNORMAL HIGH (ref 0–200)
Total CHOL/HDL Ratio: 5
Triglycerides: 219 mg/dL — ABNORMAL HIGH (ref 0.0–149.0)

## 2012-12-05 LAB — LDL CHOLESTEROL, DIRECT: Direct LDL: 135.1 mg/dL

## 2012-12-06 ENCOUNTER — Other Ambulatory Visit: Payer: Self-pay | Admitting: Family Medicine

## 2012-12-06 MED ORDER — EZETIMIBE 10 MG PO TABS: 10.0000 mg | ORAL_TABLET | Freq: Every day | ORAL | Status: DC

## 2012-12-07 ENCOUNTER — Encounter: Payer: Self-pay | Admitting: Family Medicine

## 2012-12-08 ENCOUNTER — Encounter: Payer: Self-pay | Admitting: Family Medicine

## 2012-12-08 NOTE — Telephone Encounter (Signed)
Called [facility] and medication was shipped yesterday. Unable to cancel.

## 2012-12-08 NOTE — Telephone Encounter (Signed)
Can we call [facility] this morning to cancel zetia?

## 2012-12-15 ENCOUNTER — Other Ambulatory Visit: Payer: Self-pay

## 2012-12-18 ENCOUNTER — Other Ambulatory Visit: Payer: Self-pay | Admitting: Family Medicine

## 2012-12-18 DIAGNOSIS — E785 Hyperlipidemia, unspecified: Secondary | ICD-10-CM

## 2012-12-18 DIAGNOSIS — I10 Essential (primary) hypertension: Secondary | ICD-10-CM

## 2012-12-18 DIAGNOSIS — IMO0002 Reserved for concepts with insufficient information to code with codable children: Secondary | ICD-10-CM

## 2012-12-18 DIAGNOSIS — E1165 Type 2 diabetes mellitus with hyperglycemia: Secondary | ICD-10-CM

## 2012-12-19 ENCOUNTER — Other Ambulatory Visit (INDEPENDENT_AMBULATORY_CARE_PROVIDER_SITE_OTHER): Payer: 59

## 2012-12-19 DIAGNOSIS — IMO0001 Reserved for inherently not codable concepts without codable children: Secondary | ICD-10-CM

## 2012-12-19 DIAGNOSIS — E785 Hyperlipidemia, unspecified: Secondary | ICD-10-CM

## 2012-12-19 DIAGNOSIS — E1165 Type 2 diabetes mellitus with hyperglycemia: Secondary | ICD-10-CM

## 2012-12-19 DIAGNOSIS — IMO0002 Reserved for concepts with insufficient information to code with codable children: Secondary | ICD-10-CM

## 2012-12-19 LAB — HEMOGLOBIN A1C: Hgb A1c MFr Bld: 6.9 % — ABNORMAL HIGH (ref 4.6–6.5)

## 2012-12-21 ENCOUNTER — Ambulatory Visit: Payer: 59

## 2012-12-21 DIAGNOSIS — R946 Abnormal results of thyroid function studies: Secondary | ICD-10-CM

## 2012-12-21 LAB — T4, FREE: Free T4: 0.99 ng/dL (ref 0.60–1.60)

## 2012-12-21 LAB — T3, FREE: T3, Free: 2.9 pg/mL (ref 2.3–4.2)

## 2012-12-26 ENCOUNTER — Ambulatory Visit (INDEPENDENT_AMBULATORY_CARE_PROVIDER_SITE_OTHER): Payer: 59 | Admitting: Family Medicine

## 2012-12-26 ENCOUNTER — Encounter: Payer: Self-pay | Admitting: Family Medicine

## 2012-12-26 VITALS — BP 152/100 | HR 68 | Temp 98.2°F | Wt 179.5 lb

## 2012-12-26 DIAGNOSIS — IMO0002 Reserved for concepts with insufficient information to code with codable children: Secondary | ICD-10-CM

## 2012-12-26 DIAGNOSIS — E785 Hyperlipidemia, unspecified: Secondary | ICD-10-CM

## 2012-12-26 DIAGNOSIS — E1165 Type 2 diabetes mellitus with hyperglycemia: Secondary | ICD-10-CM

## 2012-12-26 DIAGNOSIS — E0590 Thyrotoxicosis, unspecified without thyrotoxic crisis or storm: Secondary | ICD-10-CM

## 2012-12-26 DIAGNOSIS — IMO0001 Reserved for inherently not codable concepts without codable children: Secondary | ICD-10-CM

## 2012-12-26 DIAGNOSIS — I10 Essential (primary) hypertension: Secondary | ICD-10-CM

## 2012-12-26 DIAGNOSIS — Z23 Encounter for immunization: Secondary | ICD-10-CM

## 2012-12-26 MED ORDER — NIACIN ER 500 MG PO CPCR: 500.0000 mg | ORAL_CAPSULE | Freq: Every day | ORAL | Status: DC

## 2012-12-26 MED ORDER — AMLODIPINE BESYLATE 5 MG PO TABS: 5.0000 mg | ORAL_TABLET | Freq: Every day | ORAL | Status: DC

## 2012-12-26 NOTE — Assessment & Plan Note (Signed)
Chronic, LDL not at goal.  Reviewed options with patient. Intolerant of statins even RYR.  Pt desires to try niacin CR - will start 500mg  nightly.  Discussed side effects and need to monitor sugars more closely while starting med. I accidentally sent zetia to her mail order pharmacy so patient was charged $90, and states she does not desire to take med 2/2 side effect concerns.  I have reimbursed her today for cost of 1 mo supply or zetia.

## 2012-12-26 NOTE — Progress Notes (Signed)
Pre-visit discussion using our clinic review tool. No additional management support is needed unless otherwise documented below in the visit note.  

## 2012-12-26 NOTE — Addendum Note (Signed)
Addended by: Josph Macho A on: 12/26/2012 10:35 AM   Modules accepted: Orders

## 2012-12-26 NOTE — Patient Instructions (Addendum)
Flu shot today. Let's change amlodipine back to 5mg  daily as home blood pressures are actually doing very well. Return in 4-6 months for lab visit to recheck thyroid function. Try niacin CR 500mg  at bedtime (over the counter).  Take it regularly and then we will recheck cholesterol levels at next lab visit in 4-6 months. Bring [place] your blood pressure cuff to next reading.

## 2012-12-26 NOTE — Progress Notes (Signed)
  Subjective:    Patient ID: Elizabeth Curtis, female    DOB: Dec 21, 1950, 62 y.o.   MRN: [id number]  HPI CC: 3 mo f/u DM  HTN - elevated today - running borderline at home as well (130/80). Compliant with her home regimen amlodipine 5mg  and enalapril 20mg  twice daily. HLD - intolerant of statins in past. crestor and pravastatin tried in past. RYR also caused myalgias.  Takes fish oil during the day.   DM - compliant with metformin 500mg  in am and 1000mg  at bedtime and amaryl 2mg  at bedtime.  Lab Results  Component Value Date   HGBA1C 6.9* 12/19/2012    Brings log of bp and sugar readings over last 3 months - bp ranging 118-140/60-70.  Sugar log: fasting: 100s-120s.   Review of Systems Pre HPI    Objective:   Physical Exam  Nursing note and vitals reviewed. Constitutional: She appears well-developed and well-nourished. No distress.  HENT:  Mouth/Throat: Oropharynx is clear and moist. No oropharyngeal exudate.  Cardiovascular: Normal rate, regular rhythm, normal heart sounds and intact distal pulses.   No murmur heard. Pulmonary/Chest: Effort normal and breath sounds normal. No respiratory distress. She has no wheezes. She has no rales.  Musculoskeletal: She exhibits no edema.       Assessment & Plan:

## 2012-12-26 NOTE — Assessment & Plan Note (Signed)
Deteriorated, however patient brings log of sugars showing great control at home.  No changes today - decreased amlodipine back to 5mg  daily. I've asked her to bring bp cuff to compare to our readings.  ?white coat hypertension.

## 2012-12-26 NOTE — Assessment & Plan Note (Signed)
Improved control. Continue meds as up to now.

## 2013-02-20 ENCOUNTER — Encounter: Payer: Self-pay | Admitting: Family Medicine

## 2013-02-20 ENCOUNTER — Other Ambulatory Visit: Payer: Self-pay | Admitting: *Deleted

## 2013-02-20 MED ORDER — GLUCOSE BLOOD VI STRP: ORAL_STRIP | Status: DC

## 2013-06-21 ENCOUNTER — Other Ambulatory Visit (INDEPENDENT_AMBULATORY_CARE_PROVIDER_SITE_OTHER): Payer: 59

## 2013-06-21 DIAGNOSIS — E119 Type 2 diabetes mellitus without complications: Secondary | ICD-10-CM

## 2013-06-21 DIAGNOSIS — IMO0002 Reserved for concepts with insufficient information to code with codable children: Secondary | ICD-10-CM

## 2013-06-21 DIAGNOSIS — I10 Essential (primary) hypertension: Secondary | ICD-10-CM

## 2013-06-21 DIAGNOSIS — E0590 Thyrotoxicosis, unspecified without thyrotoxic crisis or storm: Secondary | ICD-10-CM

## 2013-06-21 DIAGNOSIS — E1165 Type 2 diabetes mellitus with hyperglycemia: Secondary | ICD-10-CM

## 2013-06-21 DIAGNOSIS — E785 Hyperlipidemia, unspecified: Secondary | ICD-10-CM

## 2013-06-21 DIAGNOSIS — IMO0001 Reserved for inherently not codable concepts without codable children: Secondary | ICD-10-CM

## 2013-06-21 LAB — LIPID PANEL
Cholesterol: 206 mg/dL — ABNORMAL HIGH (ref 0–200)
HDL: 44.4 mg/dL (ref >39.00)
LDL CALC: 129 mg/dL — AB (ref 0–99)
Total CHOL/HDL Ratio: 5
Triglycerides: 162 mg/dL — ABNORMAL HIGH (ref 0.0–149.0)
VLDL: 32.4 mg/dL (ref 0.0–40.0)

## 2013-06-21 LAB — HEMOGLOBIN A1C: Hgb A1c MFr Bld: 7.1 % — ABNORMAL HIGH (ref 4.6–6.5)

## 2013-06-21 LAB — T3, FREE: T3, Free: 2.8 pg/mL (ref 2.3–4.2)

## 2013-06-21 LAB — T4, FREE: Free T4: 0.94 ng/dL (ref 0.60–1.60)

## 2013-06-21 LAB — TSH: TSH: 0.51 u[IU]/mL (ref 0.35–4.50)

## 2013-06-28 ENCOUNTER — Telehealth: Payer: Self-pay | Admitting: Family Medicine

## 2013-06-28 ENCOUNTER — Ambulatory Visit (INDEPENDENT_AMBULATORY_CARE_PROVIDER_SITE_OTHER): Payer: 59 | Admitting: Family Medicine

## 2013-06-28 ENCOUNTER — Telehealth: Payer: Self-pay | Admitting: *Deleted

## 2013-06-28 ENCOUNTER — Encounter: Payer: Self-pay | Admitting: Family Medicine

## 2013-06-28 VITALS — BP 142/86 | HR 68 | Temp 98.1°F | Wt 174.2 lb

## 2013-06-28 DIAGNOSIS — R946 Abnormal results of thyroid function studies: Secondary | ICD-10-CM

## 2013-06-28 DIAGNOSIS — E119 Type 2 diabetes mellitus without complications: Secondary | ICD-10-CM

## 2013-06-28 DIAGNOSIS — I10 Essential (primary) hypertension: Secondary | ICD-10-CM

## 2013-06-28 DIAGNOSIS — E785 Hyperlipidemia, unspecified: Secondary | ICD-10-CM

## 2013-06-28 MED ORDER — ENALAPRIL MALEATE 20 MG PO TABS: 20.0000 mg | ORAL_TABLET | Freq: Two times a day (BID) | ORAL | Status: DC

## 2013-06-28 MED ORDER — AMLODIPINE BESYLATE 5 MG PO TABS: 5.0000 mg | ORAL_TABLET | Freq: Every day | ORAL | Status: DC

## 2013-06-28 MED ORDER — GLIMEPIRIDE 4 MG PO TABS: ORAL_TABLET | ORAL | Status: DC

## 2013-06-28 MED ORDER — METFORMIN HCL 1000 MG PO TABS: ORAL_TABLET | ORAL | Status: DC

## 2013-06-28 MED ORDER — HALCINONIDE 0.1 % EX CREA: TOPICAL_CREAM | CUTANEOUS | Status: DC

## 2013-06-28 MED ORDER — FLUOCINONIDE 0.05 % EX CREA: 1.0000 "application " | TOPICAL_CREAM | Freq: Two times a day (BID) | CUTANEOUS | Status: DC

## 2013-06-28 NOTE — Telephone Encounter (Signed)
Relevant patient education mailed to patient.  

## 2013-06-28 NOTE — Patient Instructions (Addendum)
Good to see you today. Increase niacin to 500mg  daily. For right shoulder - I think you have rotator cuff tendon inflammation - treat with aleve twice daily with food for 5 days then as needed, and start exercises provided today.  Return to see me in 6 months for physical, prior fasting for blood work.

## 2013-06-28 NOTE — Progress Notes (Signed)
BP 142/86  Pulse 68  Temp(Src) 98.1 F (36.7 C) (Oral)  Wt 174 lb 4 oz (79.039 kg)   CC: 6 mo f/u  Subjective:    Patient ID: Elizabeth Curtis, female    DOB: 13-Dec-1950, 63 y.o.   MRN: [id number]  HPI: Elizabeth Curtis is a 63 y.o. female presenting on 06/28/2013 for Follow-up   HLD - Last visit niacin CR 250mg  was started.  Also takes fish oil 1000mg  daily and DHA.  Pt did not want to try zetia and was intolerant to several of the statins.  Chol levels better controlled with current regimen.  DM - complaint with amaryl 4mg  daily and metformin 1000mg  bid. Increased family stress - has noticed increased am sugars with increased stress.  Working on limiting carbs.  Has not returned to gym but tries to walk regularly. Wt Readings from Last 3 Encounters:  06/28/13 174 lb 4 oz (79.039 kg)  12/26/12 179 lb 8 oz (81.421 kg)  09/23/12 178 lb 8 oz (80.967 kg)  Body mass index is 25.72 kg/(m^2).  HTN - compliant with amlodipine 5mg  daily and enalapril 20mg  bid. Accidentally erased log last night but states Bp usually running 130s/70-80s, occasional systolic in 297L.  Brings bp cuff from home - 159/89 in office today with her cuff.  H/o subclinical hyperthyroidism - on recent recheck WNL.  BP Readings from Last 3 Encounters:  06/28/13 142/86  12/26/12 152/100  09/23/12 180/100    Relevant past medical, surgical, family and social history reviewed and updated as indicated.  Allergies and medications reviewed and updated. Current Outpatient Prescriptions on File Prior to Visit  Medication Sig  . Cholecalciferol (VITAMIN D3) 1000 UNITS CHEW Chew 1 tablet by mouth daily.  . Cyanocobalamin (VITAMIN B 12 PO) Take 2,500 mcg by mouth daily.  . Docosahexaenoic Acid (DHA PO) Take 500 mg by mouth daily.  . fish oil-omega-3 fatty acids 1000 MG capsule Take 1 g by mouth daily.   Marland Kitchen glucose blood (FREESTYLE LITE) test strip Use as instructed  . Multiple Vitamin (MULTIVITAMIN) tablet Take 1 tablet  by mouth daily.  Marland Kitchen Ubiquinol 100 MG CAPS Take 1 capsule by mouth daily. coq10   No current facility-administered medications on file prior to visit.    Review of Systems Per HPI unless specifically indicated above    Objective:    BP 142/86  Pulse 68  Temp(Src) 98.1 F (36.7 C) (Oral)  Wt 174 lb 4 oz (79.039 kg)  Physical Exam  Nursing note and vitals reviewed. Constitutional: She appears well-developed and well-nourished. No distress.  HENT:  Head: Normocephalic and atraumatic.  Right Ear: External ear normal.  Left Ear: External ear normal.  Nose: Nose normal.  Mouth/Throat: Oropharynx is clear and moist. No oropharyngeal exudate.  Eyes: Conjunctivae and EOM are normal. Pupils are equal, round, and reactive to light. No scleral icterus.  Neck: Normal range of motion. Neck supple. No thyromegaly present.  Cardiovascular: Normal rate, regular rhythm, normal heart sounds and intact distal pulses.   No murmur heard. Pulmonary/Chest: Effort normal and breath sounds normal. No respiratory distress. She has no wheezes. She has no rales.  Musculoskeletal: She exhibits no edema.  Diabetic foot exam: Normal inspection No skin breakdown Slight callus formation Lmedial great toe Normal DP/PT pulses Normal sensation to light touch and monofilament Nails normal  Lymphadenopathy:    She has no cervical adenopathy.  Skin: Skin is warm and dry. No rash noted.  Psychiatric: She has a  normal mood and affect.   Results for orders placed in visit on 06/21/13  LIPID PANEL      Result Value Ref Range   Cholesterol 206 (*) 0 - 200 mg/dL   Triglycerides 162.0 (*) 0.0 - 149.0 mg/dL   HDL 44.40  >39.00 mg/dL   VLDL 32.4  0.0 - 40.0 mg/dL   LDL Cholesterol 129 (*) 0 - 99 mg/dL   Total CHOL/HDL Ratio 5    TSH      Result Value Ref Range   TSH 0.51  0.35 - 4.50 uIU/mL  HEMOGLOBIN A1C      Result Value Ref Range   Hemoglobin A1C 7.1 (*) 4.6 - 6.5 %  T4, FREE      Result Value Ref Range     Free T4 0.94  0.60 - 1.60 ng/dL  T3, FREE      Result Value Ref Range   T3, Free 2.8  2.3 - 4.2 pg/mL      Assessment & Plan:   Problem List Items Addressed This Visit   Diabetes type 2, controlled     Reviewed log of sugars - no changes today, if persistent will need increase in antihyperglycemic regimen.    Relevant Medications      enalapril (VASOTEC) tablet      glimepiride (AMARYL) tablet      metFORMIN (GLUCOPHAGE) tablet   Essential hypertension, benign     Chronic , stable based on home log of blood pressures.  No changes today.    Relevant Medications      amLODIpine (NORVASC) tablet      enalapril (VASOTEC) tablet   HLD (hyperlipidemia) - Primary     Chronic, LDL still not at goal so will increase niacin to 500mg  daily.    Relevant Medications      amLODIpine (NORVASC) tablet      enalapril (VASOTEC) tablet   Abnormal thyroid function test     Merits close monitoring. Back to normal on recent check.        Follow up plan: Return in about 6 months (around 12/29/2013), or as needed, for annual exam, prior fasting for blood work.

## 2013-06-28 NOTE — Telephone Encounter (Signed)
Sent alternative -notify patient.

## 2013-06-28 NOTE — Assessment & Plan Note (Signed)
Reviewed log of sugars - no changes today, if persistent will need increase in antihyperglycemic regimen.

## 2013-06-28 NOTE — Assessment & Plan Note (Signed)
Chronic , stable based on home log of blood pressures.  No changes today.

## 2013-06-28 NOTE — Assessment & Plan Note (Signed)
Merits close monitoring. Back to normal on recent check.

## 2013-06-28 NOTE — Progress Notes (Signed)
Pre visit review using our clinic review tool, if applicable. No additional management support is needed unless otherwise documented below in the visit note. 

## 2013-06-28 NOTE — Assessment & Plan Note (Signed)
Chronic, LDL still not at goal so will increase niacin to 500mg  daily.

## 2013-06-28 NOTE — Telephone Encounter (Signed)
Halog cream not covered. Covered creams are: Betamethasone  Val Cream 0.1%, Betamethasone Dip cream 0.05%, Betamethasone Dip Aug Cream 0.05% or Fluocinonide Cream 0.05 %. Or I can proceed with PA. Just advise what you would like to do. Thanks!

## 2013-06-29 NOTE — Telephone Encounter (Signed)
Patient notified

## 2013-08-14 ENCOUNTER — Ambulatory Visit: Payer: Self-pay | Admitting: Obstetrics and Gynecology

## 2013-08-14 LAB — HM MAMMOGRAPHY: HM Mammogram: NORMAL

## 2013-08-14 IMAGING — MG MM DIGITAL SCREENING BILAT W/ CAD
1 series · 5 of 5 positions shown · non-contrast
Comparison: Previous exam(s).

CLINICAL DATA: Screening.

EXAM:
DIGITAL SCREENING BILATERAL MAMMOGRAM WITH CAD

[R CC · right · 5 of 5 slices shown]
[im 1/5]
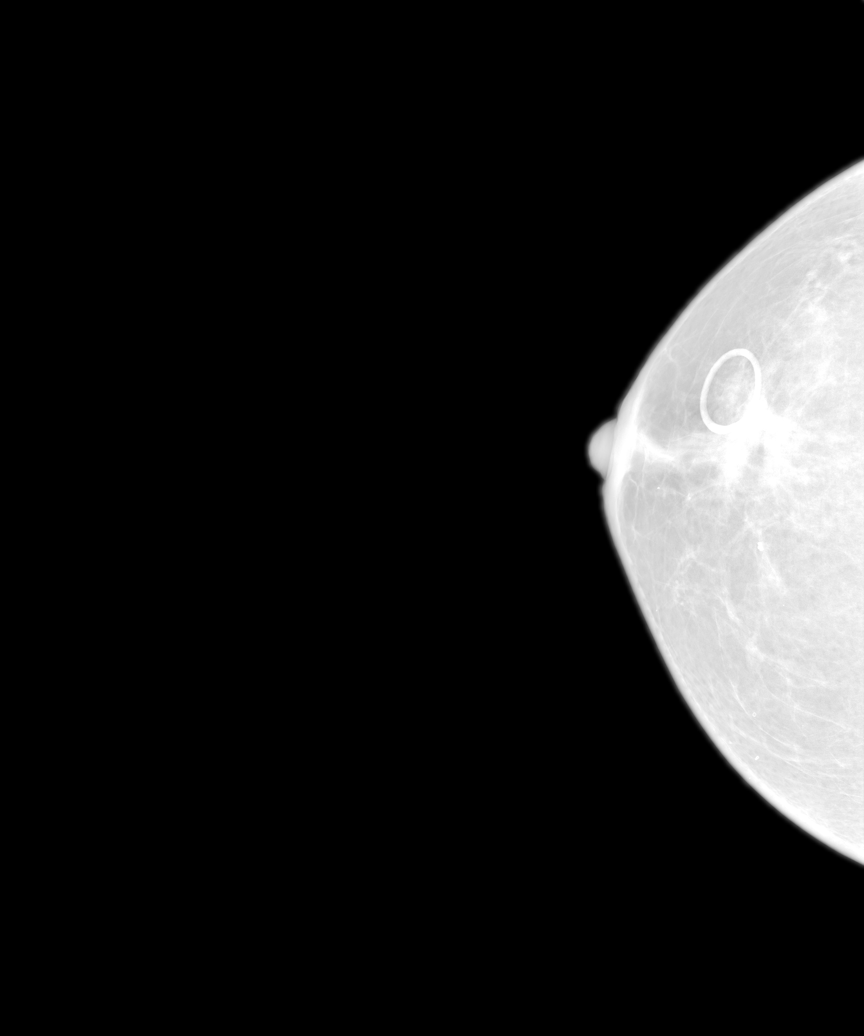
[im 2/5]
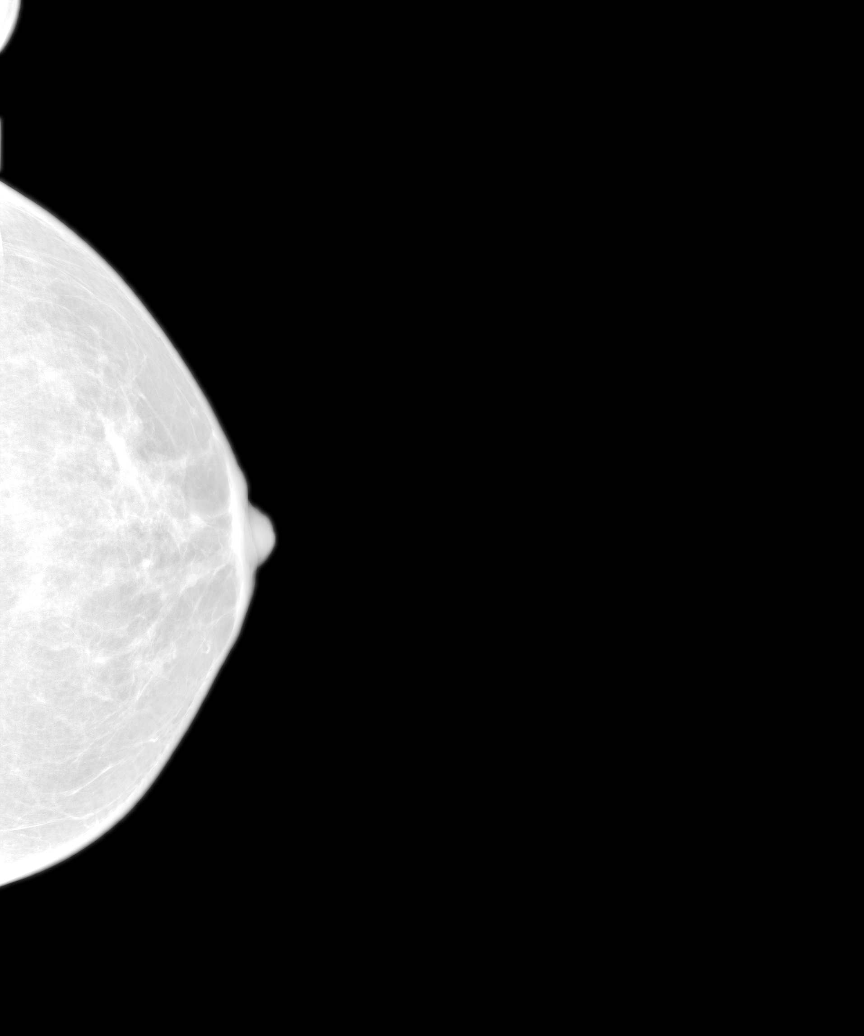
[im 3/5]
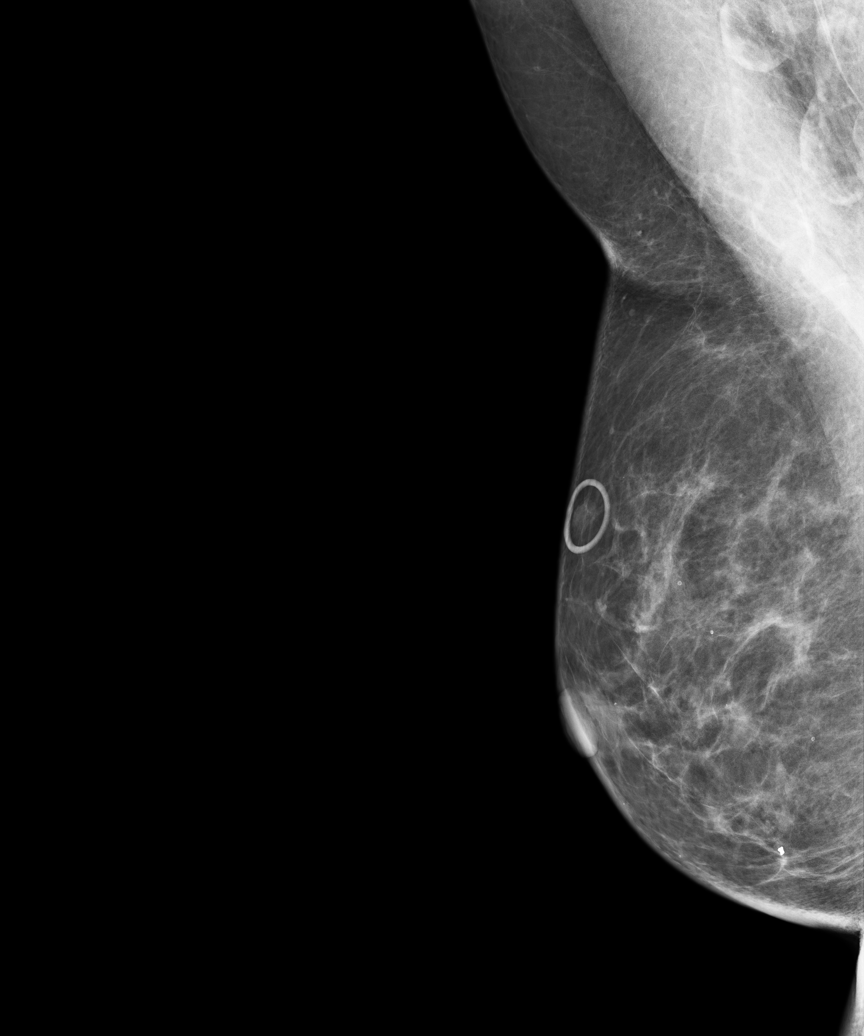
[im 4/5]
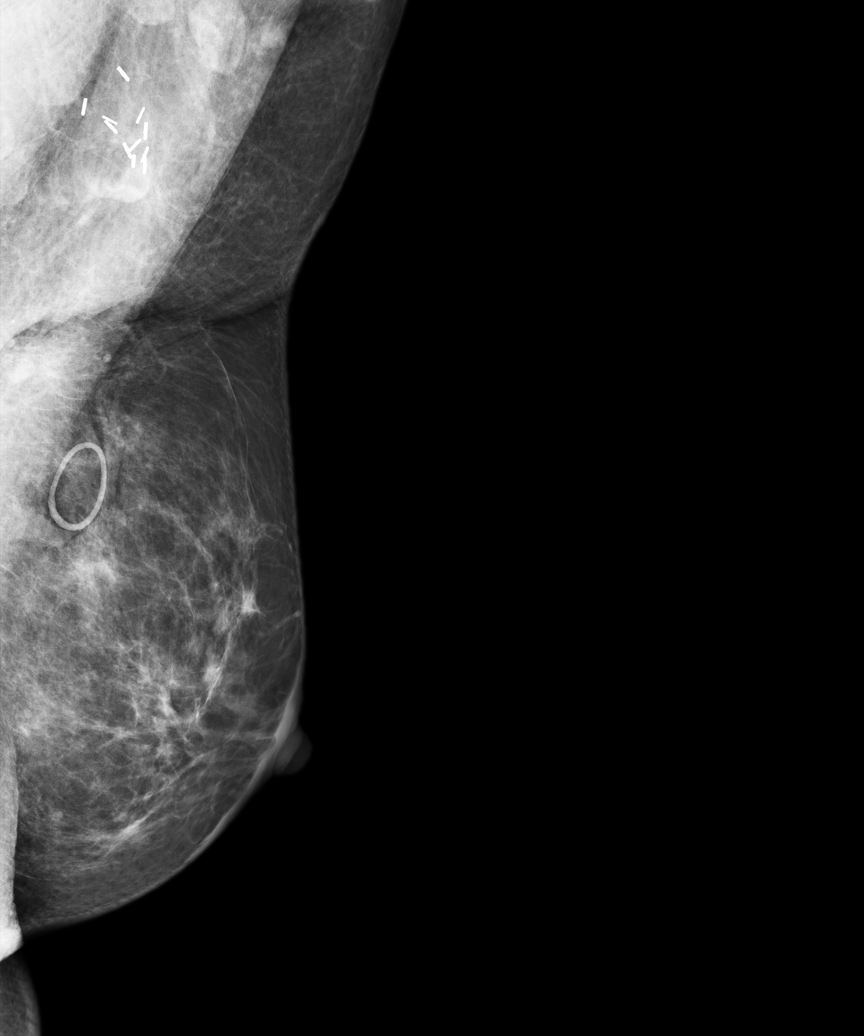
[im 5/5]
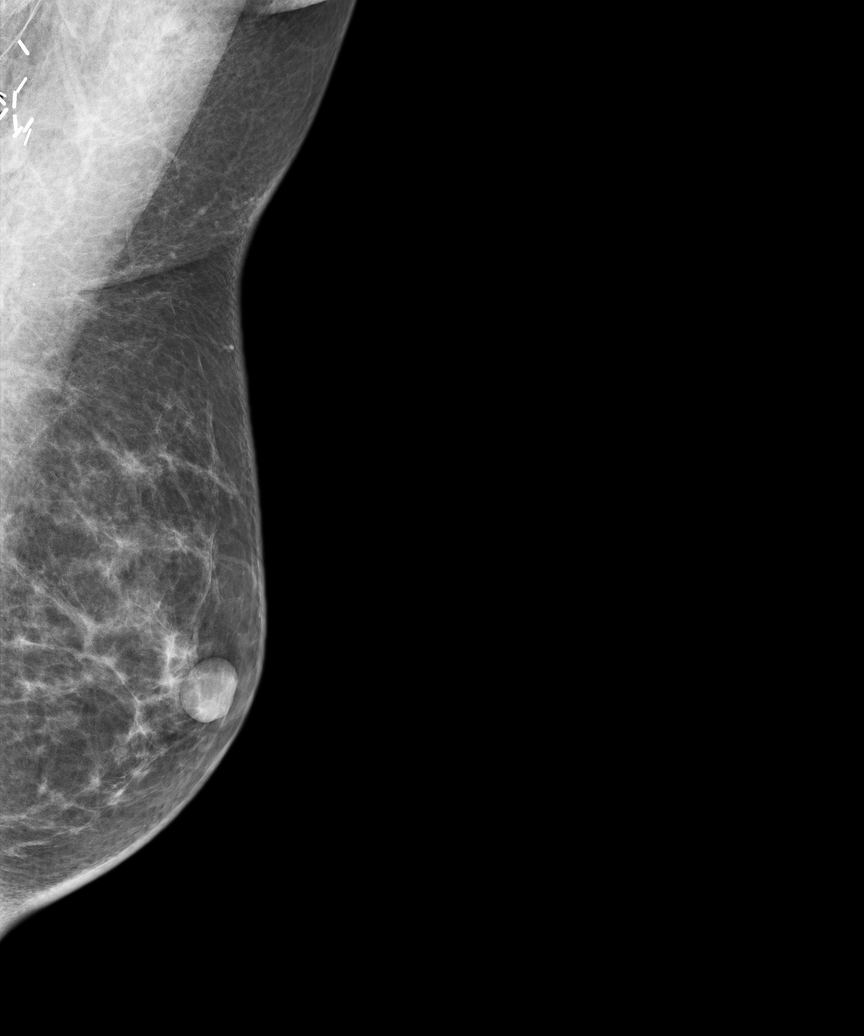

[5 of 5 positions shown; findings below may reference images not displayed]

ACR Breast Density Category c: The breast tissue is heterogeneously
dense, which may obscure small masses.
FINDINGS: There are no findings suspicious for malignancy. Images were
processed with CAD.
IMPRESSION: No mammographic evidence of malignancy. A result letter of this
screening mammogram will be mailed directly to the patient.

RECOMMENDATION:
Screening mammogram in one year. (Code:[0J])

BI-RADS CATEGORY  1: Negative.

## 2013-08-18 ENCOUNTER — Encounter: Payer: Self-pay | Admitting: Family Medicine

## 2013-08-18 NOTE — Telephone Encounter (Signed)
Please see Mychart message.

## 2013-08-24 LAB — HM PAP SMEAR: HM Pap smear: NORMAL

## 2013-08-24 LAB — VITAMIN D 25 HYDROXY (VIT D DEFICIENCY, FRACTURES): Vit D, 25-Hydroxy: 44.7

## 2013-08-30 ENCOUNTER — Encounter: Payer: Self-pay | Admitting: Family Medicine

## 2013-09-02 NOTE — Telephone Encounter (Signed)
plz print out copy of results for [place] to review. Thanks.

## 2013-09-04 NOTE — Telephone Encounter (Signed)
Printed. In your IN box for review. Entered into chart.

## 2013-09-18 LAB — HM DIABETES EYE EXAM

## 2013-09-19 ENCOUNTER — Encounter: Payer: Self-pay | Admitting: Family Medicine

## 2013-10-06 ENCOUNTER — Encounter: Payer: Self-pay | Admitting: Family Medicine

## 2013-10-09 NOTE — Telephone Encounter (Signed)
Please see Mychart message.

## 2013-12-09 ENCOUNTER — Other Ambulatory Visit: Payer: Self-pay | Admitting: Family Medicine

## 2013-12-09 DIAGNOSIS — R946 Abnormal results of thyroid function studies: Secondary | ICD-10-CM

## 2013-12-09 DIAGNOSIS — E785 Hyperlipidemia, unspecified: Secondary | ICD-10-CM

## 2013-12-09 DIAGNOSIS — E119 Type 2 diabetes mellitus without complications: Secondary | ICD-10-CM

## 2013-12-09 DIAGNOSIS — I10 Essential (primary) hypertension: Secondary | ICD-10-CM

## 2013-12-11 ENCOUNTER — Encounter: Payer: Self-pay | Admitting: Family Medicine

## 2013-12-11 ENCOUNTER — Other Ambulatory Visit (INDEPENDENT_AMBULATORY_CARE_PROVIDER_SITE_OTHER): Payer: 59

## 2013-12-11 DIAGNOSIS — E785 Hyperlipidemia, unspecified: Secondary | ICD-10-CM

## 2013-12-11 DIAGNOSIS — I10 Essential (primary) hypertension: Secondary | ICD-10-CM

## 2013-12-11 DIAGNOSIS — R946 Abnormal results of thyroid function studies: Secondary | ICD-10-CM

## 2013-12-11 DIAGNOSIS — E119 Type 2 diabetes mellitus without complications: Secondary | ICD-10-CM

## 2013-12-11 LAB — HEMOGLOBIN A1C: Hgb A1c MFr Bld: 6.7 % — ABNORMAL HIGH (ref 4.6–6.5)

## 2013-12-11 LAB — COMPREHENSIVE METABOLIC PANEL
ALK PHOS: 48 U/L (ref 39–117)
ALT: 20 U/L (ref 0–35)
AST: 14 U/L (ref 0–37)
Albumin: 3.6 g/dL (ref 3.5–5.2)
BILIRUBIN TOTAL: 0.7 mg/dL (ref 0.2–1.2)
BUN: 13 mg/dL (ref 6–23)
CO2: 30 mEq/L (ref 19–32)
Calcium: 9.4 mg/dL (ref 8.4–10.5)
Chloride: 103 mEq/L (ref 96–112)
Creatinine, Ser: 0.6 mg/dL (ref 0.4–1.2)
GFR: 99.32 mL/min (ref >60.00)
Glucose, Bld: 137 mg/dL — ABNORMAL HIGH (ref 70–99)
Potassium: 4.2 mEq/L (ref 3.5–5.1)
SODIUM: 140 meq/L (ref 135–145)
Total Protein: 6.8 g/dL (ref 6.0–8.3)

## 2013-12-11 LAB — LIPID PANEL
Cholesterol: 196 mg/dL (ref 0–200)
HDL: 37.9 mg/dL — ABNORMAL LOW (ref >39.00)
LDL CALC: 131 mg/dL — AB (ref 0–99)
NonHDL: 158.1
Total CHOL/HDL Ratio: 5
Triglycerides: 136 mg/dL (ref 0.0–149.0)
VLDL: 27.2 mg/dL (ref 0.0–40.0)

## 2013-12-11 LAB — MICROALBUMIN / CREATININE URINE RATIO
Creatinine,U: 70 mg/dL
MICROALB/CREAT RATIO: 1.4 mg/g (ref 0.0–30.0)
Microalb, Ur: 1 mg/dL (ref 0.0–1.9)

## 2013-12-11 LAB — TSH: TSH: 0.79 u[IU]/mL (ref 0.35–4.50)

## 2013-12-14 LAB — HM DIABETES EYE EXAM

## 2013-12-18 ENCOUNTER — Ambulatory Visit (INDEPENDENT_AMBULATORY_CARE_PROVIDER_SITE_OTHER): Payer: 59 | Admitting: Family Medicine

## 2013-12-18 ENCOUNTER — Other Ambulatory Visit: Payer: Self-pay | Admitting: *Deleted

## 2013-12-18 ENCOUNTER — Encounter: Payer: Self-pay | Admitting: Family Medicine

## 2013-12-18 VITALS — BP 144/80 | HR 64 | Temp 98.5°F | Ht 69.0 in | Wt 173.5 lb

## 2013-12-18 DIAGNOSIS — E119 Type 2 diabetes mellitus without complications: Secondary | ICD-10-CM

## 2013-12-18 DIAGNOSIS — I10 Essential (primary) hypertension: Secondary | ICD-10-CM

## 2013-12-18 DIAGNOSIS — Z0000 Encounter for general adult medical examination without abnormal findings: Secondary | ICD-10-CM

## 2013-12-18 DIAGNOSIS — R946 Abnormal results of thyroid function studies: Secondary | ICD-10-CM

## 2013-12-18 DIAGNOSIS — E785 Hyperlipidemia, unspecified: Secondary | ICD-10-CM

## 2013-12-18 MED ORDER — METFORMIN HCL 1000 MG PO TABS: ORAL_TABLET | ORAL | Status: DC

## 2013-12-18 MED ORDER — AMLODIPINE BESYLATE 5 MG PO TABS: 5.0000 mg | ORAL_TABLET | Freq: Every day | ORAL | Status: DC

## 2013-12-18 MED ORDER — ENALAPRIL MALEATE 20 MG PO TABS: 20.0000 mg | ORAL_TABLET | Freq: Two times a day (BID) | ORAL | Status: DC

## 2013-12-18 MED ORDER — GLIMEPIRIDE 4 MG PO TABS: ORAL_TABLET | ORAL | Status: DC

## 2013-12-18 NOTE — Patient Instructions (Addendum)
Good to see you today. Get flu shot after cataract surgery. Return as needed or in 1 year for next physical. Continue working towards better cholesterol control.

## 2013-12-18 NOTE — Assessment & Plan Note (Signed)
Chronic, improved. Refilled meds today. Foot exam today.

## 2013-12-18 NOTE — Assessment & Plan Note (Signed)
Chronic, stable. meds refilled today.

## 2013-12-18 NOTE — Assessment & Plan Note (Signed)
TSH back to normal. Continue yearly check for now.

## 2013-12-18 NOTE — Progress Notes (Signed)
BP 144/80 mmHg  Pulse 64  Temp(Src) 98.5 F (36.9 C) (Oral)  Ht 5\' 9"  (1.753 m)  Wt 173 lb 8 oz (78.699 kg)  BMI 25.61 kg/m2   CC: CPE  Subjective:    Patient ID: Elizabeth Curtis, female    DOB: 1950-10-16, 63 y.o.   MRN: [id number]  HPI: Elizabeth Curtis is a 63 y.o. female presenting on 12/18/2013 for Annual Exam   FIL passed away last month - trip to [place].  Has been seeing Dr. Noemi Chapel for shoulder and knee upcoming arthroscopy 01/2014 Has been seeing Dr. Gershon Crane for cataract, upcoming surgery later this month.  Preventative: Well woman at [facility], Dr. Cristino Martes. Last saw 08/2013. LMP - 2010. menopausal.  Last mammo 08/2013 normal per patient. Colonoscopy - 2010 [place], 1 polyp, 12mm lipoma, o/w normal, rec rpt 10 yrs.  Tetanus 2010.  Flu shot yearly.  Pneumovax - 09/2011 - felt ill after this  Shingles shot - h/o shingles in past Seat belt use discussed Sunscreen use discussed, no changing skin lesions.  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  Activity: walking regularly Diet: good water, daily fruits/vegetables  Relevant past medical, surgical, family and social history reviewed and updated as indicated.  Allergies and medications reviewed and updated. Current Outpatient Prescriptions on File Prior to Visit  Medication Sig  . amLODipine (NORVASC) 5 MG tablet Take 1 tablet (5 mg total) by mouth at bedtime.  . Cholecalciferol (VITAMIN D3) 1000 UNITS CHEW Chew 1 tablet by mouth daily.  . Cyanocobalamin (VITAMIN B 12 PO) Take 2,500 mcg by mouth daily.  . Docosahexaenoic Acid (DHA PO) Take 500 mg by mouth daily.  . enalapril (VASOTEC) 20 MG tablet Take 1 tablet (20 mg total) by mouth 2 (two) times daily.  . fish oil-omega-3 fatty acids 1000 MG capsule Take 1 g by mouth daily.   Marland Kitchen glimepiride (AMARYL) 4 MG tablet Take 1/2 by mouth once daily  . glucose  blood (FREESTYLE LITE) test strip Use as instructed  . metFORMIN (GLUCOPHAGE) 1000 MG tablet Take 1/2 by mouth every morning and one every evening  . Multiple Vitamin (MULTIVITAMIN) tablet Take 1 tablet by mouth daily.  . niacin 500 MG CR capsule Take 250 mg by mouth at bedtime.  Marland Kitchen Ubiquinol 100 MG CAPS Take 1 capsule by mouth daily. coq10   No current facility-administered medications on file prior to visit.   Past Medical History  Diagnosis Date  . Lipoma   . Schatzki's ring 07/23/08    EGD dilated O/W normal (Dr. Sharlett Iles)  . History of colonic polyps     Hyperplastic  . Plantar wart   . Dysphagia, unspecified(787.20)   . Diverticulosis of colon (without mention of hemorrhage)   . Chronic low back pain   . Diabetes mellitus   . HLD (hyperlipidemia)   . Disturbance of skin sensation   . Shoulder pain, left   . History of pelvic ultrasound 03/05    Uterus 13 X 7 X 8cm, Fibroid 2.6cm  . Essential hypertension, benign 1995  . Diffuse cystic mastopathy   . H/O cystitis 2011  . Mammographic microcalcification 2013    Family History  Problem Relation Age of Onset  . Diabetes Mother   . Hyperlipidemia Mother   . Hypertension Mother   . Mental illness Mother     Personality disorder  . Kidney disease Father  failure, (Diaylsis)  . Diabetes Father   . Hypertension Sister   . Diabetes Brother   . Coronary artery disease Brother 53    CAD, hospitalized freq, PTCA  . Gout Brother   . Stroke Paternal Grandfather   . Stroke Maternal Grandmother   . Cancer Maternal Aunt 32    breast  . Cancer Paternal Aunt 49    breast    Review of Systems  Constitutional: Negative for fever, chills, activity change, appetite change, fatigue and unexpected weight change.  HENT: Negative for hearing loss.   Eyes: Negative for visual disturbance.  Respiratory: Negative for cough, chest tightness, shortness of breath and wheezing.   Cardiovascular: Negative for chest pain, palpitations  and leg swelling.  Gastrointestinal: Negative for nausea, vomiting, abdominal pain, diarrhea, constipation, blood in stool and abdominal distention.  Genitourinary: Negative for hematuria and difficulty urinating.  Musculoskeletal: Negative for myalgias, arthralgias and neck pain.  Skin: Negative for rash.  Neurological: Negative for dizziness, seizures, syncope and headaches.  Hematological: Negative for adenopathy. Does not bruise/bleed easily.  Psychiatric/Behavioral: Negative for dysphoric mood. The patient is not nervous/anxious.    Per HPI unless specifically indicated above    Objective:    BP 144/80 mmHg  Pulse 64  Temp(Src) 98.5 F (36.9 C) (Oral)  Ht 5\' 9"  (1.753 m)  Wt 173 lb 8 oz (78.699 kg)  BMI 25.61 kg/m2  Physical Exam  Constitutional: She is oriented to person, place, and time. She appears well-developed and well-nourished. No distress.  HENT:  Head: Normocephalic and atraumatic.  Right Ear: Hearing, tympanic membrane, external ear and ear canal normal.  Left Ear: Hearing, tympanic membrane, external ear and ear canal normal.  Nose: Nose normal.  Mouth/Throat: Uvula is midline, oropharynx is clear and moist and mucous membranes are normal. No oropharyngeal exudate, posterior oropharyngeal edema or posterior oropharyngeal erythema.  Eyes: Conjunctivae and EOM are normal. Pupils are equal, round, and reactive to light. No scleral icterus.  Neck: Normal range of motion. Neck supple. No thyromegaly present.  Cardiovascular: Normal rate, regular rhythm, normal heart sounds and intact distal pulses.   No murmur heard. Pulses:      Radial pulses are 2+ on the right side, and 2+ on the left side.  Pulmonary/Chest: Effort normal and breath sounds normal. No respiratory distress. She has no wheezes. She has no rales.  Breast - deferred per GYN  Abdominal: Soft. Bowel sounds are normal. She exhibits no distension and no mass. There is no tenderness. There is no rebound and  no guarding.  Genitourinary:  Deferred per GYN  Musculoskeletal: Normal range of motion. She exhibits no edema.  Diabetic foot exam: Normal inspection No skin breakdown No calluses  Normal DP pulses Normal sensation to light touch and monofilament Nails normal   Lymphadenopathy:    She has no cervical adenopathy.  Neurological: She is alert and oriented to person, place, and time.  CN grossly intact, station and gait intact  Skin: Skin is warm and dry. No rash noted.  Psychiatric: She has a normal mood and affect. Her behavior is normal. Judgment and thought content normal.  Nursing note and vitals reviewed.  Results for orders placed or performed in visit on 12/18/13  HM DIABETES EYE EXAM  Result Value Ref Range   HM Diabetic Eye Exam No Retinopathy No Retinopathy      Assessment & Plan:   Problem List Items Addressed This Visit    HLD (hyperlipidemia)    Continue niacin  and fish oil. LDL not at goal <100 given dm hx.    Healthcare maintenance - Primary    Preventative protocols reviewed and updated unless pt declined. Discussed healthy diet and lifestyle.    Essential hypertension, benign    Chronic, stable. meds refilled today.    Diabetes type 2, controlled    Chronic, improved. Refilled meds today. Foot exam today.    Relevant Orders      HM DIABETES FOOT EXAM (Completed)   Abnormal thyroid function test    TSH back to normal. Continue yearly check for now.        Follow up plan: Return in about 6 months (around 06/18/2014), or as needed, for follow up visit.

## 2013-12-18 NOTE — Progress Notes (Signed)
Pre visit review using our clinic review tool, if applicable. No additional management support is needed unless otherwise documented below in the visit note. 

## 2013-12-18 NOTE — Assessment & Plan Note (Signed)
Preventative protocols reviewed and updated unless pt declined. Discussed healthy diet and lifestyle.  

## 2013-12-18 NOTE — Assessment & Plan Note (Signed)
Continue niacin and fish oil. LDL not at goal <100 given dm hx.

## 2013-12-29 ENCOUNTER — Ambulatory Visit (INDEPENDENT_AMBULATORY_CARE_PROVIDER_SITE_OTHER): Payer: 59

## 2013-12-29 DIAGNOSIS — Z23 Encounter for immunization: Secondary | ICD-10-CM

## 2014-01-09 HISTORY — PX: KNEE ARTHROSCOPY W/ MENISCAL REPAIR: SHX1877

## 2014-01-19 ENCOUNTER — Encounter: Payer: Self-pay | Admitting: Family Medicine

## 2014-04-10 LAB — HM DIABETES EYE EXAM

## 2014-04-12 ENCOUNTER — Ambulatory Visit (INDEPENDENT_AMBULATORY_CARE_PROVIDER_SITE_OTHER): Payer: 59 | Admitting: Neurology

## 2014-04-12 ENCOUNTER — Encounter: Payer: Self-pay | Admitting: Neurology

## 2014-04-12 VITALS — BP 176/82 | HR 80 | Resp 16 | Ht 69.0 in | Wt 179.8 lb

## 2014-04-12 DIAGNOSIS — R269 Unspecified abnormalities of gait and mobility: Secondary | ICD-10-CM | POA: Diagnosis not present

## 2014-04-12 DIAGNOSIS — H539 Unspecified visual disturbance: Secondary | ICD-10-CM | POA: Insufficient documentation

## 2014-04-12 NOTE — Progress Notes (Signed)
[facility]  PATIENT: Elizabeth Curtis DOB: 11-17-1950  REFERRING DOCTOR OR PCP:  Ria Bush SOURCE: Patient  _________________________________   HISTORICAL  CHIEF COMPLAINT:  Chief Complaint  Patient presents with  . Visual disturbance    Elizabeth Curtis c/o several episodes of "gray" vision left eye, onset May 2014.  Sts. feels like there is a veil over her eye.  Sts. episodes have increased in frequency since 03-18-14.  She has a detailed list of days/times she has experienced visual disturbance.  Sts. she has seen otphalmologist twice (Dr. Gershon Crane), and he is not able to find cause. Sts. she occasionally stumbles when walking--not sure which foot is the problem.  Sts. paternal aunt passed away with ALS and this has been a concern of hers.   . Gait disturbance    Sts. no imaging studies have been done./fim    HISTORY OF PRESENT ILLNESS:  I had the pleasure of seeing your patient, Elizabeth Curtis, at [facility] for neurologic consultation. As you know, she is a 64 year old womoan has had episodes of visual disturbance and also has had some episodes a gait disturbance. She notes that she had several episodes in a row during May 2014 when she was on a cruise in [place]. At that time she had an upper respiratory infection. She noted that her eyes had graying of vision with some fuzziness. This was lasting for a couple hours but occurred several days in a row. Over the next year she had a few more episodes. During that time she had several eye exams and no intraocular problem was identified. Note, she does have diabetes for the past 15 years no evidence of diabetic eye disease. Then, a few months ago she began to have these episodes more frequently. She is now having the episodes occur several times a week. The episodes still last a couple hours at a time. The blurring and graying of vision only occurs on the left. She does not note any problems at all on  the right. She had cataract surgery in 2012 on the left but I exam has not shown any problem with the implant. There were no films identified.  Her ophthalmologist is Dr. Gershon Crane. She denies any eye pain. She notes that colors will be desaturated when one of the episodes as occurring out of the left eye but then returned back to normal. She denies any diplopia.   She has never had a carotid study. She has never had a brain MRI or MRA.  Over the past couple months, she has noted that she will sometimes stumble as she walks. It seems like the bottom of her right foot the floor and that may make her stumble. The left leg seems to be doing better. She has not actually fallen. She has had some difficulties with her left knee and recently had meniscus surgery on 02/07/2014. She has had diabetes 15 years. She denies weakness in her feet. She occasionally notes mild altered sensation and the soles but there is not complete numbness. She is able to close her eyes without holding on. However, she feels that she has always been a little clumsy.  Bladder function is usually fine but she does note minimal stress incontinence if she sneezes.  She had melanoma on her abdomen. She had surgery in 2012. The margins were clean.    REVIEW OF SYSTEMS: Constitutional: No fevers, chills, sweats, or change in appetite Eyes: As above.   No double vision,  eye pain Ear, nose and throat: No hearing loss, ear pain, nasal congestion, sore throat.  Occasional tinnitus x 15 years Cardiovascular: No chest pain.  Occasional palpitations Respiratory: No shortness of breath at rest or with exertion.   No wheezes.  She snores but no witnessed OSA GastrointestinaI: No nausea, vomiting, diarrhea, abdominal pain, fecal incontinence Genitourinary: No dysuria, urinary retention or frequency.  No nocturia. Musculoskeletal: No neck pain, back pain Integumentary: No rash, pruritus, skin lesions Neurological: as above Psychiatric: No  depression at this time.  No anxiety Endocrine: No palpitations, diaphoresis, change in appetite, change in weigh or increased thirst Hematologic/Lymphatic: No anemia, purpura, petechiae. Allergic/Immunologic: No itchy/runny eyes, nasal congestion, recent allergic reactions, rashes  ALLERGIES: Allergies  Allergen Reactions  . Avandia [Rosiglitazone] Other (See Comments)    arthralgias  . Other Other (See Comments)    Steri Strips - blood blisters and skin irritation.  Mastasol glue - blood blisters and skin irritation.   . Statins Other (See Comments)    crestor - arthralgias Pravastatin - depression RYR - arthralgias    HOME MEDICATIONS:  Current outpatient prescriptions:  .  amLODipine (NORVASC) 5 MG tablet, Take 1 tablet (5 mg total) by mouth at bedtime., Disp: 90 tablet, Rfl: 3 .  Cholecalciferol (VITAMIN D3) 1000 UNITS CHEW, Chew 1 tablet by mouth daily., Disp: , Rfl:  .  Cyanocobalamin (VITAMIN B 12 PO), Take 2,500 mcg by mouth daily., Disp: , Rfl:  .  Docosahexaenoic Acid (DHA PO), Take 500 mg by mouth daily., Disp: , Rfl:  .  enalapril (VASOTEC) 20 MG tablet, Take 1 tablet (20 mg total) by mouth 2 (two) times daily., Disp: 180 tablet, Rfl: 3 .  fish oil-omega-3 fatty acids 1000 MG capsule, Take 1 g by mouth daily. , Disp: , Rfl:  .  glimepiride (AMARYL) 4 MG tablet, Take 1/2 by mouth once daily, Disp: 45 tablet, Rfl: 3 .  glucose blood (FREESTYLE LITE) test strip, Use as instructed, Disp: 100 each, Rfl: 12 .  metFORMIN (GLUCOPHAGE) 1000 MG tablet, Take 1/2 by mouth every morning and one every evening, Disp: 135 tablet, Rfl: 3 .  Multiple Vitamin (MULTIVITAMIN) tablet, Take 1 tablet by mouth daily., Disp: , Rfl:  .  niacin 500 MG CR capsule, Take 250 mg by mouth at bedtime., Disp: , Rfl:  .  Ubiquinol 100 MG CAPS, Take 1 capsule by mouth daily. coq10, Disp: , Rfl:   PAST MEDICAL HISTORY: Past Medical History  Diagnosis Date  . Lipoma   . Schatzki's ring 07/23/08     EGD dilated O/W normal (Dr. Sharlett Iles)  . History of colonic polyps     Hyperplastic  . Plantar wart   . Dysphagia, unspecified(787.20)   . Diverticulosis of colon (without mention of hemorrhage)   . Chronic low back pain   . Diabetes mellitus   . HLD (hyperlipidemia)   . Disturbance of skin sensation   . Shoulder pain, left   . History of pelvic ultrasound 03/05    Uterus 13 X 7 X 8cm, Fibroid 2.6cm  . Essential hypertension, benign 1995  . Diffuse cystic mastopathy   . H/O cystitis 2011  . Mammographic microcalcification 2013  . Vision abnormalities     PAST SURGICAL HISTORY: Past Surgical History  Procedure Laterality Date  . Cesarean section  1982    Breech presentation  . Colonoscopy  2010    Patterson, rec rpt 10 yrs  . Esophagogastroduodenoscopy  2010    Patterson, dilated schatzki  ring  . Breast biopsy  07/2011    rec rpt 6 mo  . Melanoma excision  2012    upper abdomen  . Eye surgery Left 2012    cataract    FAMILY HISTORY: Family History  Problem Relation Age of Onset  . Diabetes Mother   . Hyperlipidemia Mother   . Hypertension Mother   . Mental illness Mother     Personality disorder  . Kidney disease Father     failure, (Diaylsis)  . Diabetes Father   . Diabetes Brother   . Coronary artery disease Brother 62    CAD, hospitalized freq, PTCA  . Gout Brother   . Hypertension Brother   . Stroke Paternal Grandfather   . Stroke Maternal Grandmother   . Cancer Maternal Aunt 19    breast  . Cancer Paternal Aunt 41    breast  . ALS Paternal Aunt     SOCIAL HISTORY:  History   Social History  . Marital Status: Married    Spouse Name: N/A  . Number of Children: 1  . Years of Education: N/A   Occupational History  . Retired     [employer]   Social History Main Topics  . Smoking status: Never Smoker   . Smokeless tobacco: Never Used  . Alcohol Use: Yes     Comment: occassionally  . Drug Use: No  . Sexual Activity: Not on file   Other  Topics Concern  . 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   Activity: Patient does not get regular exercise, has gym membership   Diet: good water, daily fruits/vegetables     PHYSICAL EXAM  Filed Vitals:   04/12/14 1434  BP: 176/82  Pulse: 80  Resp: 16  Height: 5\' 9"  (1.753 m)  Weight: 179 lb 12.8 oz (81.557 kg)    Body mass index is 26.54 kg/(m^2).   General: The patient is well-developed and well-nourished and in no acute distress  Eyes:  Funduscopic exam shows normal optic discs and retinal vessels.  Has some astigmatism  Neck: The neck is supple, no carotid bruits are noted.  The neck is nontender.  Cardiovascular: The heart has a regular rate and rhythm with a normal S1 and S2. There were no murmurs, gallops or rubs. Lungs are clear to auscultation.  Skin: Extremities are without significant edema.  Musculoskeletal:  Back is nontender  Neurologic Exam  Mental status: The patient is alert and oriented x 3 at the time of the examination. The patient has apparent normal recent and remote memory, with an apparently normal attention span and concentration ability.   Speech is normal.  Cranial nerves: Extraocular movements are full. Pupils are equal, round, and reactive to light and accomodation.  Visual fields are full.  Facial symmetry is present. There is good facial sensation to soft touch bilaterally.Facial strength is normal.  Trapezius and sternocleidomastoid strength is normal. No dysarthria is noted.  The tongue is midline, and the patient has symmetric elevation of the soft palate. No obvious hearing deficits are noted.  Motor:  Muscle bulk is normal.   Tone is normal. Strength is  5 / 5 in all 4 extremities.   Sensory: Sensory testing is intact to pinprick, soft touch and vibration sensation in all 4 extremities  except for minimal decreased vibration sensation  in toes  Coordination: Cerebellar testing reveals good finger-nose-finger and heel-to-shin bilaterally.  Gait and station: Station is normal.   Gait is normal. Tandem gait is mildly wide. Romberg is negative.   Reflexes: Deep tendon reflexes are symmetric and 3+ bilaterally.   Plantar responses are flexor.    DIAGNOSTIC DATA (LABS, IMAGING, TESTING) - I reviewed patient records, labs, notes, testing and imaging myself where available.     Component Value Date/Time   NA 140 12/11/2013 0822   K 4.2 12/11/2013 0822   CL 103 12/11/2013 0822   CO2 30 12/11/2013 0822   GLUCOSE 137* 12/11/2013 0822   BUN 13 12/11/2013 0822   CREATININE 0.6 12/11/2013 0822   CALCIUM 9.4 12/11/2013 0822   PROT 6.8 12/11/2013 0822   ALBUMIN 3.6 12/11/2013 0822   AST 14 12/11/2013 0822   ALT 20 12/11/2013 0822   ALKPHOS 48 12/11/2013 0822   BILITOT 0.7 12/11/2013 0822   GFRNONAA 97.13 11/29/2009 0932   GFRAA 133 05/02/2007 1120   Lab Results  Component Value Date   CHOL 196 12/11/2013   HDL 37.90* 12/11/2013   LDLCALC 131* 12/11/2013   LDLDIRECT 135.1 12/05/2012   TRIG 136.0 12/11/2013   CHOLHDL 5 12/11/2013   Lab Results  Component Value Date   HGBA1C 6.7* 12/11/2013   No results found for: VITAMINB12 Lab Results  Component Value Date   TSH 0.79 12/11/2013       ASSESSMENT AND PLAN  Visual disturbance - Plan: MR Brain W Wo Contrast, US Carotid Bilateral  Gait disturbance - Plan: MR Brain W Wo Contrast, US Carotid Bilateral   In summary, Elizabeth Curtis is a 64 year old woman who has had multiple 1-3 hour episodes of visual blurring over the past year and half. She has had more episodes the past couple months. The etiology is unclear. The time course isn't unusual. I would not expect repeated episodes of optic neuritis and they usually last for days in several hours.  Vascular issues such as transient ischemic attacks was using ranges  would be more likely to last several minutes though they could recur multiple times. Her exam did not show any obvious ocular problems and she has had multiple eye exams. She does have hyperreflexia.    For these reasons we will check an MRI of the brain with and without contrast to rule out inflammatory and ischemic and structural changes that might lead to her symptoms and to her hyperreflexia. Additionally, I will check a carotid Doppler to make sure that she does not have left internal carotid artery stenosis that might lead to or other vascular issues.   BP is elevated and she should continue on her medications and see primary care earlier if it continues to be elevated.  She will return to see [place] in 6 weeks or sooner if she has new or worsening neurologic symptoms.   Elizabeth Curtis A. Felecia Shelling, MD, PhD 04/10/5174, 1:60 PM Certified in Neurology, Clinical Neurophysiology, Sleep Medicine, Pain Medicine and Neuroimaging  [facility] [address] [place],  [address] [phone]

## 2014-04-26 ENCOUNTER — Telehealth: Payer: Self-pay | Admitting: Radiology

## 2014-04-26 NOTE — Telephone Encounter (Signed)
04/26/14 Called to schedule Carotid on home and cell.  Left message to return call.

## 2014-05-02 ENCOUNTER — Ambulatory Visit (INDEPENDENT_AMBULATORY_CARE_PROVIDER_SITE_OTHER): Payer: 59

## 2014-05-02 DIAGNOSIS — H539 Unspecified visual disturbance: Secondary | ICD-10-CM

## 2014-05-02 DIAGNOSIS — R269 Unspecified abnormalities of gait and mobility: Secondary | ICD-10-CM

## 2014-05-09 ENCOUNTER — Ambulatory Visit
Admission: RE | Admit: 2014-05-09 | Discharge: 2014-05-09 | Disposition: A | Discharge status: Home / Self Care | Payer: 59 | Source: Ambulatory Visit | Attending: Neurology | Admitting: Neurology

## 2014-05-09 DIAGNOSIS — R269 Unspecified abnormalities of gait and mobility: Secondary | ICD-10-CM

## 2014-05-09 DIAGNOSIS — H539 Unspecified visual disturbance: Secondary | ICD-10-CM | POA: Diagnosis not present

## 2014-05-09 MED ORDER — GADOBENATE DIMEGLUMINE 529 MG/ML IV SOLN
15.0000 mL | Freq: Once | INTRAVENOUS | Status: AC | PRN
  Administered 2014-05-09: 15 mL via INTRAVENOUS

## 2014-05-10 ENCOUNTER — Telehealth: Payer: Self-pay | Admitting: *Deleted

## 2014-05-10 NOTE — Telephone Encounter (Signed)
-----   Message from Britt Bottom, MD sent at 05/10/2014 10:39 AM EDT ----- Please let her know MRI is normal

## 2014-05-10 NOTE — Telephone Encounter (Signed)
Spoke with Estill Bamberg and per RAS, advised that mri brain is normal.  She verbalized understanding of same/fim

## 2014-05-14 ENCOUNTER — Telehealth: Payer: Self-pay | Admitting: Neurology

## 2014-05-14 NOTE — Telephone Encounter (Signed)
Patient is calling to get the results of the Carotid Artery test she took.  Please call.

## 2014-05-15 NOTE — Telephone Encounter (Signed)
Spoke with Estill Bamberg and advised preliminary carotid duplex report is normal.  She verbalized understanding of same/fim

## 2014-05-17 NOTE — Progress Notes (Signed)
Carotid ultrasound looks good.   Arteries not narrowed

## 2014-05-21 ENCOUNTER — Ambulatory Visit: Payer: 59 | Admitting: Neurology

## 2014-05-24 ENCOUNTER — Ambulatory Visit: Payer: 59 | Admitting: Neurology

## 2014-05-29 ENCOUNTER — Encounter: Payer: Self-pay | Admitting: Neurology

## 2014-05-29 ENCOUNTER — Ambulatory Visit (INDEPENDENT_AMBULATORY_CARE_PROVIDER_SITE_OTHER): Payer: 59 | Admitting: Neurology

## 2014-05-29 VITALS — BP 144/86 | HR 72 | Resp 14 | Ht 69.0 in | Wt 175.0 lb

## 2014-05-29 DIAGNOSIS — R269 Unspecified abnormalities of gait and mobility: Secondary | ICD-10-CM | POA: Diagnosis not present

## 2014-05-29 DIAGNOSIS — H539 Unspecified visual disturbance: Secondary | ICD-10-CM | POA: Diagnosis not present

## 2014-05-29 NOTE — Progress Notes (Signed)
[facility]  PATIENT: Elizabeth Curtis DOB: [birth year]  _________________________________   HISTORICAL  CHIEF COMPLAINT:  Chief Complaint  Patient presents with  . Visual Disturbance    Sts. episodes of "gray" vision have decreased--has only had about 4 episodes since last here./fim    HISTORY OF PRESENT ILLNESS:  Elizabeth Curtis returns for a f/u evaluation regarding the episodes of visual disturbance.     Since her last visit, she had only about 4 episodes each lasting 1-2 hours of visual graying.  Elizabeth Curtis is often worse on one side but not always the same side.   These episodes started during May 2014 when she had an upper respiratory infection. She noted that her eyes had graying of vision with some fuzziness. This was lasting for a couple hours but occurred several days in a row. Over the next year she had a few more episodes. During that time she had several eye exams and no intraocular problem was identified. Ste, she does have diabetes for the past 15 years no evidence of diabetic eye disease. Then, a few months ago she began to have these episodes more frequently. She is now having the episodes occur several times a week. The episodes still last a couple hours at a time. The blurring and graying of vision only occurs on the left. She does not note any problems at all on the right. She had cataract surgery in 2012 on the left but I exam has not shown any problem with the implant. There were no films identified.  Her ophthalmologist is Dr. Gershon Crane. She denies any eye pain. She notes that colors will be desaturated when one of the episodes as occurring out of the left eye but then returned back to normal. She denies any diplopia.   She has never had a carotid study. She has never had a brain MRI or MRA.    She had reported some stumbling at the last visit.   This is no worse.  She has had diabetes 15 years. She denies weakness in her feet. She occasionally notes  mild altered sensation and the soles but there is no fixed numbness. She is able to close her eyes without holding on. However, she feels that she has always been a little clumsy, even as a young adult.   REVIEW OF SYSTEMS: Constitutional: No fevers, chills, sweats, or change in appetite Eyes: As above.   No double vision, eye pain Ear, nose and throat: No hearing loss, ear pain, nasal congestion, sore throat.  Occasional tinnitus x 15 years Cardiovascular: No chest pain.  Occasional palpitations Respiratory: No shortness of breath at rest or with exertion.   No wheezes.  She snores but no witnessed OSA GastrointestinaI: No nausea, vomiting, diarrhea, abdominal pain, fecal incontinence Genitourinary: No dysuria, urinary retention or frequency.  No nocturia. Musculoskeletal: No neck pain, back pain Integumentary: No rash, pruritus, skin lesions Neurological: as above Psychiatric: No depression at this time.  No anxiety Endocrine: No palpitations, diaphoresis, change in appetite, change in weigh or increased thirst Hematologic/Lymphatic: No anemia, purpura, petechiae. Allergic/Immunologic: No itchy/runny eyes, nasal congestion, recent allergic reactions, rashes  ALLERGIES: Allergies  Allergen Reactions  . Avandia [Rosiglitazone] Other (See Comments)    arthralgias  . Other Other (See Comments)    Steri Strips - blood blisters and skin irritation.  Mastasol glue - blood blisters and skin irritation.   . Statins Other (See Comments)    crestor - arthralgias Pravastatin - depression RYR -  arthralgias    HOME MEDICATIONS:  Current outpatient prescriptions:  .  amLODipine (NORVASC) 5 MG tablet, Take 1 tablet (5 mg total) by mouth at bedtime., Disp: 90 tablet, Rfl: 3 .  Cholecalciferol (VITAMIN D3) 1000 UNITS CHEW, Chew 1 tablet by mouth daily., Disp: , Rfl:  .  Cyanocobalamin (VITAMIN B 12 PO), Take 2,500 mcg by mouth daily., Disp: , Rfl:  .  Docosahexaenoic Acid (DHA PO), Take  500 mg by mouth daily., Disp: , Rfl:  .  enalapril (VASOTEC) 20 MG tablet, Take 1 tablet (20 mg total) by mouth 2 (two) times daily., Disp: 180 tablet, Rfl: 3 .  fish oil-omega-3 fatty acids 1000 MG capsule, Take 1 g by mouth daily. , Disp: , Rfl:  .  glimepiride (AMARYL) 4 MG tablet, Take 1/2 by mouth once daily, Disp: 45 tablet, Rfl: 3 .  Glucosamine-Chondroitin (GLUCOSAMINE CHONDR COMPLEX PO), Take by mouth., Disp: , Rfl:  .  glucose blood (FREESTYLE LITE) test strip, Use as instructed, Disp: 100 each, Rfl: 12 .  metFORMIN (GLUCOPHAGE) 1000 MG tablet, Take 1/2 by mouth every morning and one every evening, Disp: 135 tablet, Rfl: 3 .  niacin 500 MG CR capsule, Take 250 mg by mouth at bedtime., Disp: , Rfl:  .  Ubiquinol 100 MG CAPS, Take 1 capsule by mouth daily. coq10, Disp: , Rfl:   PAST MEDICAL HISTORY: Past Medical History  Diagnosis Date  . Lipoma   . Schatzki's ring 07/23/08    EGD dilated O/W normal (Dr. Sharlett Iles)  . History of colonic polyps     Hyperplastic  . Plantar wart   . Dysphagia, unspecified(787.20)   . Diverticulosis of colon (without mention of hemorrhage)   . Chronic low back pain   . Diabetes mellitus   . HLD (hyperlipidemia)   . Disturbance of skin sensation   . Shoulder pain, left   . History of pelvic ultrasound 03/05    Uterus 13 X 7 X 8cm, Fibroid 2.6cm  . Essential hypertension, benign 1995  . Diffuse cystic mastopathy   . H/O cystitis 2011  . Mammographic microcalcification 2013  . Vision abnormalities     PAST SURGICAL HISTORY: Past Surgical History  Procedure Laterality Date  . Cesarean section  1982    Breech presentation  . Colonoscopy  2010    Patterson, rec rpt 10 yrs  . Esophagogastroduodenoscopy  2010    Patterson, dilated schatzki ring  . Breast biopsy  07/2011    rec rpt 6 mo  . Melanoma excision  2012    upper abdomen  . Eye surgery Left 2012    cataract    FAMILY HISTORY: Family History  Problem Relation Age of Onset  .  Diabetes Mother   . Hyperlipidemia Mother   . Hypertension Mother   . Mental illness Mother     Personality disorder  . Kidney disease Father     failure, (Diaylsis)  . Diabetes Father   . Diabetes Brother   . Coronary artery disease Brother 86    CAD, hospitalized freq, PTCA  . Gout Brother   . Hypertension Brother   . Stroke Paternal Grandfather   . Stroke Maternal Grandmother   . Cancer Maternal Aunt 86    breast  . Cancer Paternal Aunt 47    breast  . ALS Paternal Aunt     SOCIAL HISTORY:  History   Social History  . Marital Status: Married    Spouse Name: N/A  . Number of  Children: 1  . Years of Education: N/A   Occupational History  . Retired     [employer]   Social History Main Topics  . Smoking status: Never Smoker   . Smokeless tobacco: Never Used  . Alcohol Use: Yes     Comment: occassionally  . Drug Use: No  . Sexual Activity: Not on file   Other Topics Concern  . 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   Activity: Patient does not get regular exercise, has gym membership   Diet: good water, daily fruits/vegetables     PHYSICAL EXAM  Filed Vitals:   05/29/14 1027  BP: 144/86  Pulse: 72  Resp: 14  Height: 5\' 9"  (1.753 m)  Weight: 175 lb (79.379 kg)    Body mass index is 25.83 kg/(m^2).   General: The patient is well-developed and well-nourished and in no acute distress.  Eyes:  Normal retinal vessels and optic discs  Neurologic Exam  Mental status: The patient is alert and oriented x 3 at the time of the examination. The patient has apparent normal recent and remote memory, with an apparently normal attention span and concentration ability.   Speech is normal.  Cranial nerves: Extraocular movements are full. Pupils are equal, round, and reactive to light and accomodation.  Visual  fields are full.  Facial symmetry is present. There is good facial sensation to soft touch bilaterally.Facial strength is normal.  Trapezius and sternocleidomastoid strength is normal. No dysarthria is noted.  The tongue is midline, and the patient has symmetric elevation of the soft palate. No obvious hearing deficits are noted.  Motor:  Muscle bulk is normal.   Tone is normal. Strength is  5 / 5 in all 4 extremities.   Sensory: Sensory testing is intact to touch and vibration sensation in all 4 extremities except for minimal decreased vibration sensation in toes  Coordination: Cerebellar testing reveals good finger-nose-finger bilaterally.  Gait and station: Station is normal.   Gait is normal. Tandem gait is mildly wide. Romberg is negative.   Reflexes: Deep tendon reflexes are symmetric and 2-3+ bilaterally.      DIAGNOSTIC DATA (LABS, IMAGING, TESTING) - I reviewed patient records, labs, notes, testing and imaging myself where available.   Lab Results  Component Value Date   CHOL 196 12/11/2013   HDL 37.90* 12/11/2013   LDLCALC 131* 12/11/2013   LDLDIRECT 135.1 12/05/2012   TRIG 136.0 12/11/2013   CHOLHDL 5 12/11/2013   Lab Results  Component Value Date   HGBA1C 6.7* 12/11/2013   No results found for: VITAMINB12 Lab Results  Component Value Date   TSH 0.79 12/11/2013       ASSESSMENT AND PLAN  Visual disturbance  Gait disturbance    1.   Etiology of her visual disturbance is uncertain. Her MRI and carotid ultrasound were both normal for age.    We discussed that the time course of her symptoms is very unusual in that most neurologic symptoms last minutes or last a long timen case while her spells are 1-2 hours.    One possibility would be acephalgic migraines though these are usually for 15-20 minutes  In case there is a vascular component, recommend that she take one baby aspirin per day. Her kidney function is fine.    2.  She should call us  if the visual  symptoms become much worse or if her gait worsens or she has other new or worsening neurologic symptoms.   She can return as needed.   Etoile Looman A. Felecia Shelling, MD, PhD 3/76/2831, 51:76 AM Certified in Neurology, Clinical Neurophysiology, Sleep Medicine, Pain Medicine and Neuroimaging  [facility] [address] [place], [place] [address] [phone]

## 2014-06-09 ENCOUNTER — Other Ambulatory Visit: Payer: Self-pay | Admitting: Family Medicine

## 2014-06-09 DIAGNOSIS — E785 Hyperlipidemia, unspecified: Secondary | ICD-10-CM

## 2014-06-09 DIAGNOSIS — E119 Type 2 diabetes mellitus without complications: Secondary | ICD-10-CM

## 2014-06-11 ENCOUNTER — Other Ambulatory Visit (INDEPENDENT_AMBULATORY_CARE_PROVIDER_SITE_OTHER): Payer: 59

## 2014-06-11 DIAGNOSIS — E785 Hyperlipidemia, unspecified: Secondary | ICD-10-CM | POA: Diagnosis not present

## 2014-06-11 DIAGNOSIS — E119 Type 2 diabetes mellitus without complications: Secondary | ICD-10-CM

## 2014-06-11 LAB — HEMOGLOBIN A1C: Hgb A1c MFr Bld: 6.9 % — ABNORMAL HIGH (ref 4.6–6.5)

## 2014-06-11 LAB — LIPID PANEL
CHOL/HDL RATIO: 5
Cholesterol: 179 mg/dL (ref 0–200)
HDL: 37.9 mg/dL — ABNORMAL LOW (ref >39.00)
LDL CALC: 111 mg/dL — AB (ref 0–99)
NonHDL: 141.1
TRIGLYCERIDES: 152 mg/dL — AB (ref 0.0–149.0)
VLDL: 30.4 mg/dL (ref 0.0–40.0)

## 2014-06-18 ENCOUNTER — Ambulatory Visit (INDEPENDENT_AMBULATORY_CARE_PROVIDER_SITE_OTHER): Payer: 59 | Admitting: Family Medicine

## 2014-06-18 ENCOUNTER — Encounter: Payer: Self-pay | Admitting: Family Medicine

## 2014-06-18 VITALS — BP 126/66 | HR 68 | Temp 98.1°F | Wt 175.5 lb

## 2014-06-18 DIAGNOSIS — H539 Unspecified visual disturbance: Secondary | ICD-10-CM | POA: Diagnosis not present

## 2014-06-18 DIAGNOSIS — E119 Type 2 diabetes mellitus without complications: Secondary | ICD-10-CM | POA: Diagnosis not present

## 2014-06-18 DIAGNOSIS — I10 Essential (primary) hypertension: Secondary | ICD-10-CM

## 2014-06-18 DIAGNOSIS — E785 Hyperlipidemia, unspecified: Secondary | ICD-10-CM

## 2014-06-18 MED ORDER — ASPIRIN EC 81 MG PO TBEC: 81.0000 mg | DELAYED_RELEASE_TABLET | Freq: Every day | ORAL | Status: DC

## 2014-06-18 NOTE — Assessment & Plan Note (Signed)
S/p unrevealing neuro and ophtho evaluations

## 2014-06-18 NOTE — Progress Notes (Signed)
BP 126/66 mmHg  Pulse 68  Temp(Src) 98.1 F (36.7 C) (Oral)  Wt 175 lb 8 oz (79.606 kg)   CC: 96mo f/u visit  Subjective:    Patient ID: Elizabeth Curtis, female    DOB: 08/27/1950, 64 y.o.   MRN: [id number]  HPI: Elizabeth Curtis is a 64 y.o. female presenting on 06/18/2014 for Follow-up   Left vision changes - fuzzy grey vision. S/p multiple evals by ophtho and neuro unrevealing. ?migraine aura without the headache.   Left knee arthroscopy by Dr Noemi Chapel for meniscal tear and arthritis.   HLD - compliant with DHA, fish oil, and niacin 500mg  CR   HTN - Compliant with current antihypertensive regimen of amlodipine 5mg  daily and enalapril 20mg  bid. Does check blood pressures at home: averaging 130/80s. No low blood pressure readings or symptoms of dizziness/syncope.  Denies HA, vision changes, CP/tightness, SOB, leg swelling.    DM - regularly does not check sugars. Compliant with antihyperglycemic regimen which includes: metformin 500mg  in am and 1000mg  in pm and amaryl 2mg  daily.  Denies low sugars or hypoglycemic symptoms. Denies paresthesias. Last diabetic eye exam 04/2014.  Pneumovax: 09/2011.  Prevnar: not due yet. Lab Results  Component Value Date   HGBA1C 6.9* 06/11/2014   Diabetic Foot Exam - Simple   Simple Foot Form  Diabetic Foot exam was performed with the following findings:  Yes 06/18/2014  9:55 AM  Visual Inspection  No deformities, no ulcerations, no other skin breakdown bilaterally:  Yes  Sensation Testing  Intact to touch and monofilament testing bilaterally:  Yes  Pulse Check  Posterior Tibialis and Dorsalis pulse intact bilaterally:  Yes  Comments       Relevant past medical, surgical, family and social history reviewed and updated as indicated. Interim medical history since our last visit reviewed. Allergies and medications reviewed and updated. Current Outpatient Prescriptions on File Prior to Visit  Medication Sig  . amLODipine (NORVASC) 5 MG tablet  Take 1 tablet (5 mg total) by mouth at bedtime.  . Cholecalciferol (VITAMIN D3) 1000 UNITS CHEW Chew 1 tablet by mouth daily.  . Cyanocobalamin (VITAMIN B 12 PO) Take 2,500 mcg by mouth daily.  . Docosahexaenoic Acid (DHA PO) Take 500 mg by mouth daily.  . enalapril (VASOTEC) 20 MG tablet Take 1 tablet (20 mg total) by mouth 2 (two) times daily.  . fish oil-omega-3 fatty acids 1000 MG capsule Take 1 g by mouth daily.   Marland Kitchen glimepiride (AMARYL) 4 MG tablet Take 1/2 by mouth once daily  . Glucosamine-Chondroitin (GLUCOSAMINE CHONDR COMPLEX PO) Take by mouth.  Marland Kitchen glucose blood (FREESTYLE LITE) test strip Use as instructed  . metFORMIN (GLUCOPHAGE) 1000 MG tablet Take 1/2 by mouth every morning and one every evening  . niacin 500 MG CR capsule Take 250 mg by mouth at bedtime.  Marland Kitchen Ubiquinol 100 MG CAPS Take 1 capsule by mouth daily. coq10   No current facility-administered medications on file prior to visit.    Review of Systems Per HPI unless specifically indicated above     Objective:    BP 126/66 mmHg  Pulse 68  Temp(Src) 98.1 F (36.7 C) (Oral)  Wt 175 lb 8 oz (79.606 kg)  Wt Readings from Last 3 Encounters:  06/18/14 175 lb 8 oz (79.606 kg)  05/29/14 175 lb (79.379 kg)  05/09/14 180 lb (81.647 kg)    Physical Exam  Constitutional: She appears well-developed and well-nourished. No distress.  HENT:  Head: Normocephalic  and atraumatic.  Right Ear: External ear normal.  Left Ear: External ear normal.  Nose: Nose normal.  Mouth/Throat: Oropharynx is clear and moist. No oropharyngeal exudate.  Eyes: Conjunctivae and EOM are normal. Pupils are equal, round, and reactive to light. No scleral icterus.  Neck: Normal range of motion. Neck supple.  Cardiovascular: Normal rate, regular rhythm, normal heart sounds and intact distal pulses.   No murmur heard. Pulmonary/Chest: Effort normal and breath sounds normal. No respiratory distress. She has no wheezes. She has no rales.    Musculoskeletal: She exhibits no edema.  See HPI for foot exam if done  Lymphadenopathy:    She has no cervical adenopathy.  Skin: Skin is warm and dry. No rash noted.  Psychiatric: She has a normal mood and affect.  Nursing note and vitals reviewed.  Results for orders placed or performed in visit on 06/18/14  HM DIABETES EYE EXAM  Result Value Ref Range   HM Diabetic Eye Exam No Retinopathy No Retinopathy      Assessment & Plan:   Problem List Items Addressed This Visit    Visual disturbance    S/p unrevealing neuro and ophtho evaluations      HLD (hyperlipidemia)    Improvement noted - discussed goal LDL <100. Intolerant to statins.      Relevant Medications   aspirin EC 81 MG tablet   Essential hypertension, benign    Chronic, stable. Continue current regimen.      Relevant Medications   aspirin EC 81 MG tablet   Diabetes type 2, controlled - Primary    Chronic, stable. Continue current regimen. Foot exam today. UTD pneumovax       Relevant Medications   aspirin EC 81 MG tablet       Follow up plan: Return in about 6 months (around 12/19/2014), or as needed, for follow up visit.

## 2014-06-18 NOTE — Assessment & Plan Note (Signed)
Improvement noted - discussed goal LDL <100. Intolerant to statins.

## 2014-06-18 NOTE — Progress Notes (Signed)
Pre visit review using our clinic review tool, if applicable. No additional management support is needed unless otherwise documented below in the visit note. 

## 2014-06-18 NOTE — Patient Instructions (Signed)
You are doing well today, no changes. Return as needed or in 6 months for next physical.

## 2014-06-18 NOTE — Assessment & Plan Note (Signed)
Chronic, stable. Continue current regimen. 

## 2014-06-18 NOTE — Assessment & Plan Note (Signed)
Chronic, stable. Continue current regimen. Foot exam today. UTD pneumovax

## 2014-11-21 ENCOUNTER — Other Ambulatory Visit: Payer: Self-pay | Admitting: Obstetrics & Gynecology

## 2014-12-20 ENCOUNTER — Other Ambulatory Visit: Payer: 59

## 2014-12-25 ENCOUNTER — Encounter: Payer: 59 | Admitting: Family Medicine

## 2014-12-27 ENCOUNTER — Ambulatory Visit (INDEPENDENT_AMBULATORY_CARE_PROVIDER_SITE_OTHER): Payer: 59

## 2014-12-27 DIAGNOSIS — Z23 Encounter for immunization: Secondary | ICD-10-CM | POA: Diagnosis not present

## 2015-01-05 ENCOUNTER — Other Ambulatory Visit: Payer: Self-pay | Admitting: Family Medicine

## 2015-01-15 ENCOUNTER — Other Ambulatory Visit: Payer: Self-pay | Admitting: Obstetrics & Gynecology

## 2015-01-15 DIAGNOSIS — Z1239 Encounter for other screening for malignant neoplasm of breast: Secondary | ICD-10-CM

## 2015-01-18 ENCOUNTER — Ambulatory Visit
Admission: RE | Admit: 2015-01-18 | Discharge: 2015-01-18 | Disposition: A | Discharge status: Home / Self Care | Payer: PPO | Source: Ambulatory Visit | Attending: Obstetrics & Gynecology | Admitting: Obstetrics & Gynecology

## 2015-01-18 DIAGNOSIS — Z1231 Encounter for screening mammogram for malignant neoplasm of breast: Secondary | ICD-10-CM | POA: Diagnosis not present

## 2015-01-18 DIAGNOSIS — Z1239 Encounter for other screening for malignant neoplasm of breast: Secondary | ICD-10-CM

## 2015-01-18 IMAGING — MG MM DIGITAL SCREENING BILAT W/ CAD
5 series · 5 of 5 positions shown · non-contrast
Comparison: Previous exam(s).

CLINICAL DATA: Screening. History of benign right breast biopsy.

EXAM:
DIGITAL SCREENING BILATERAL MAMMOGRAM WITH CAD

[L MLO]
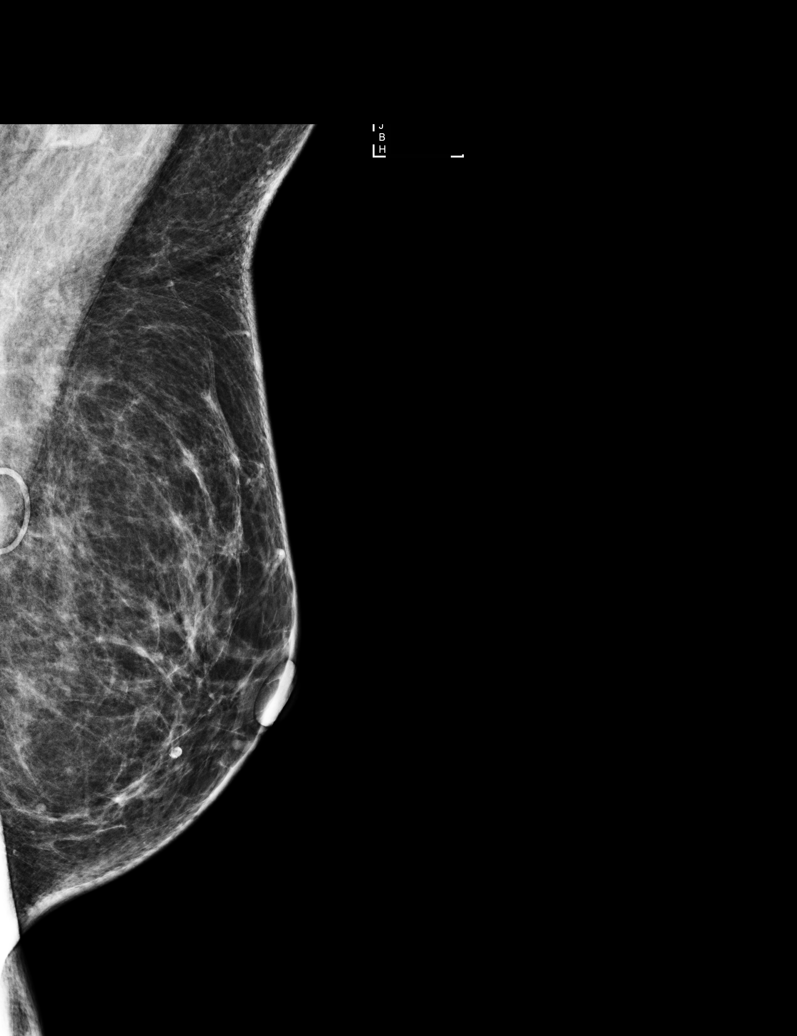

[R CC]
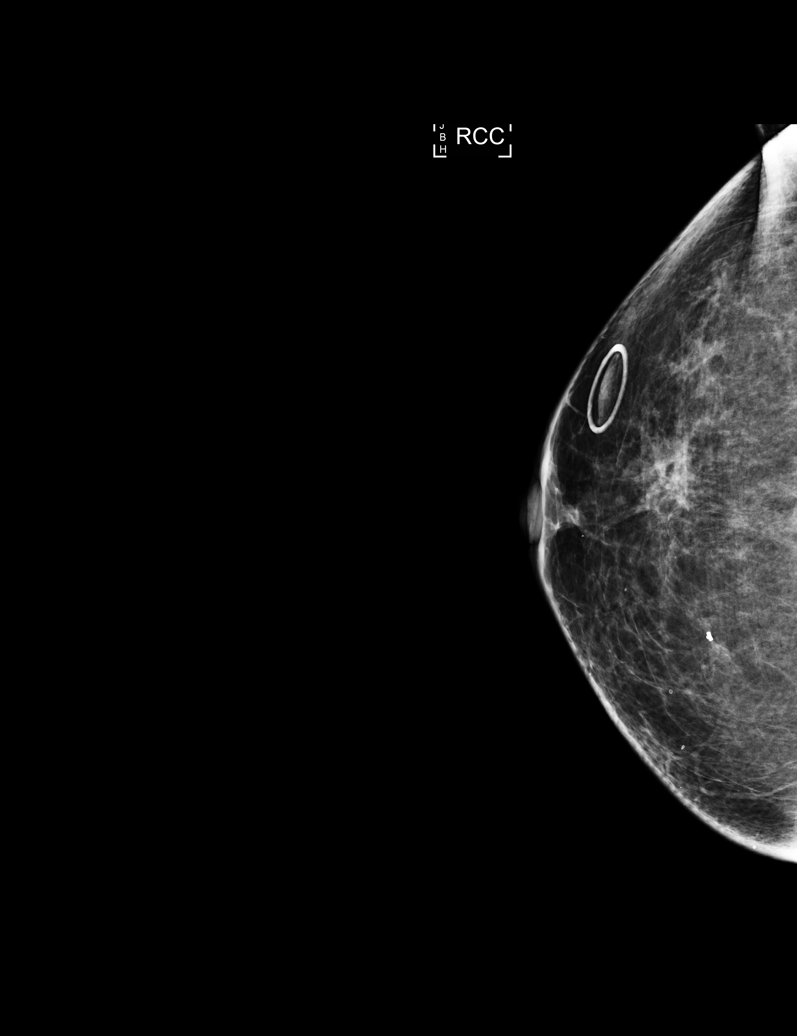

[R MLO]
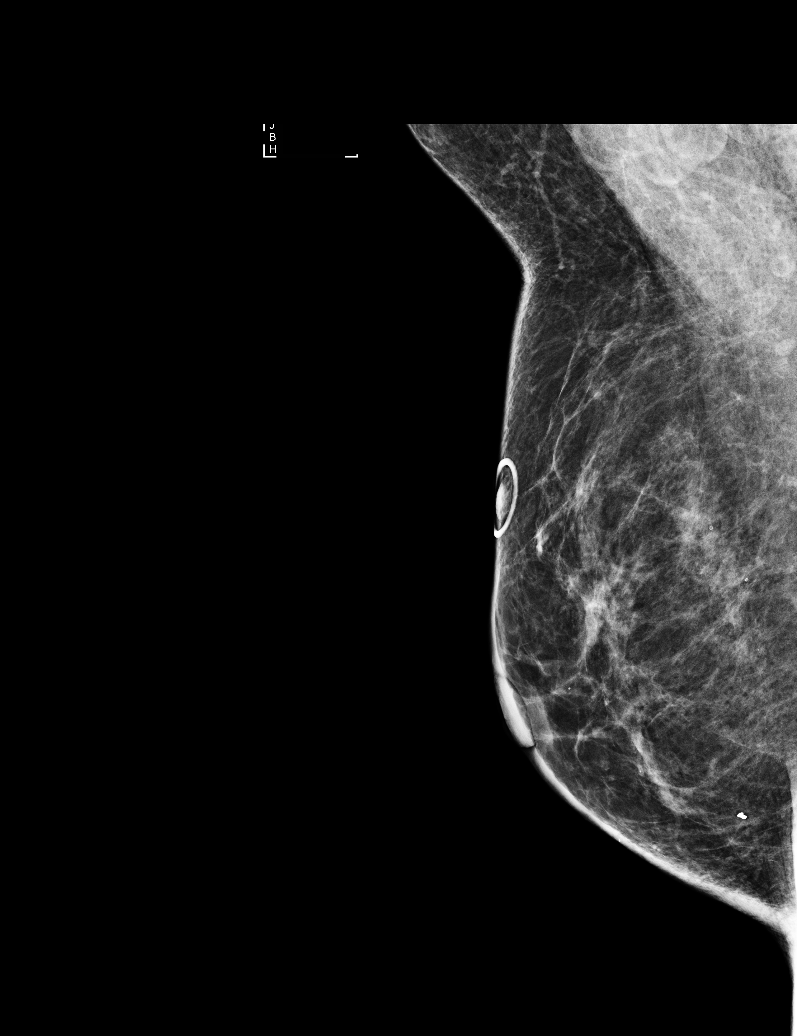

[L CC (1 of 2)]
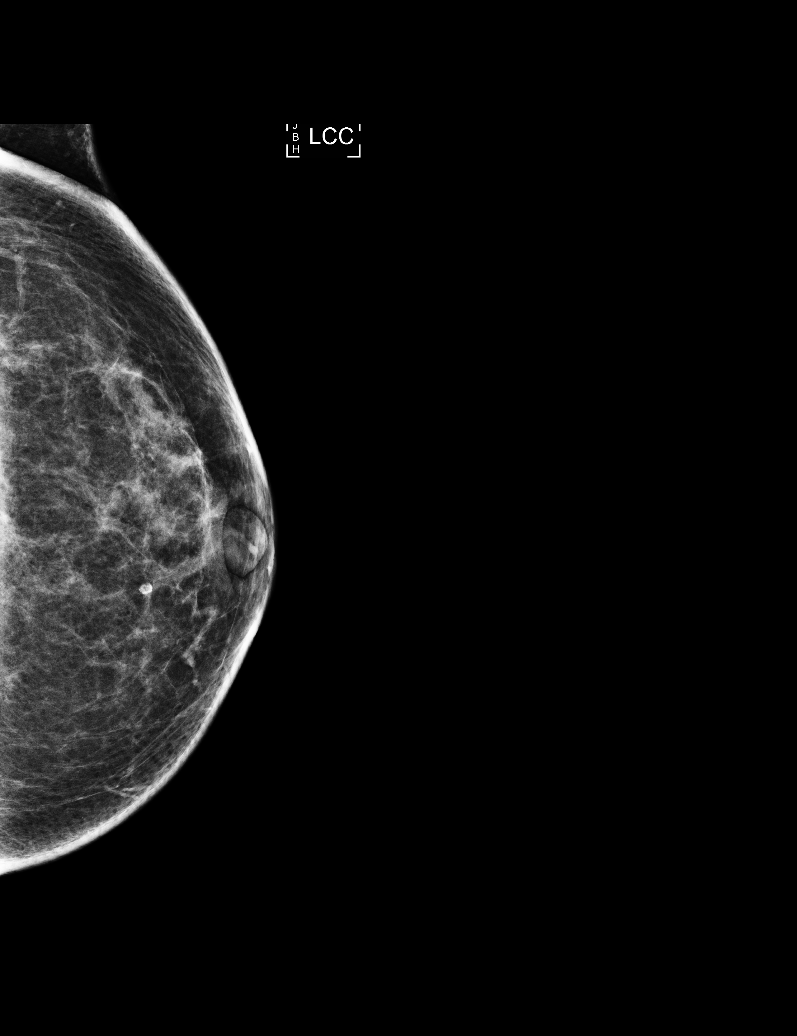

[L CC (2 of 2)]
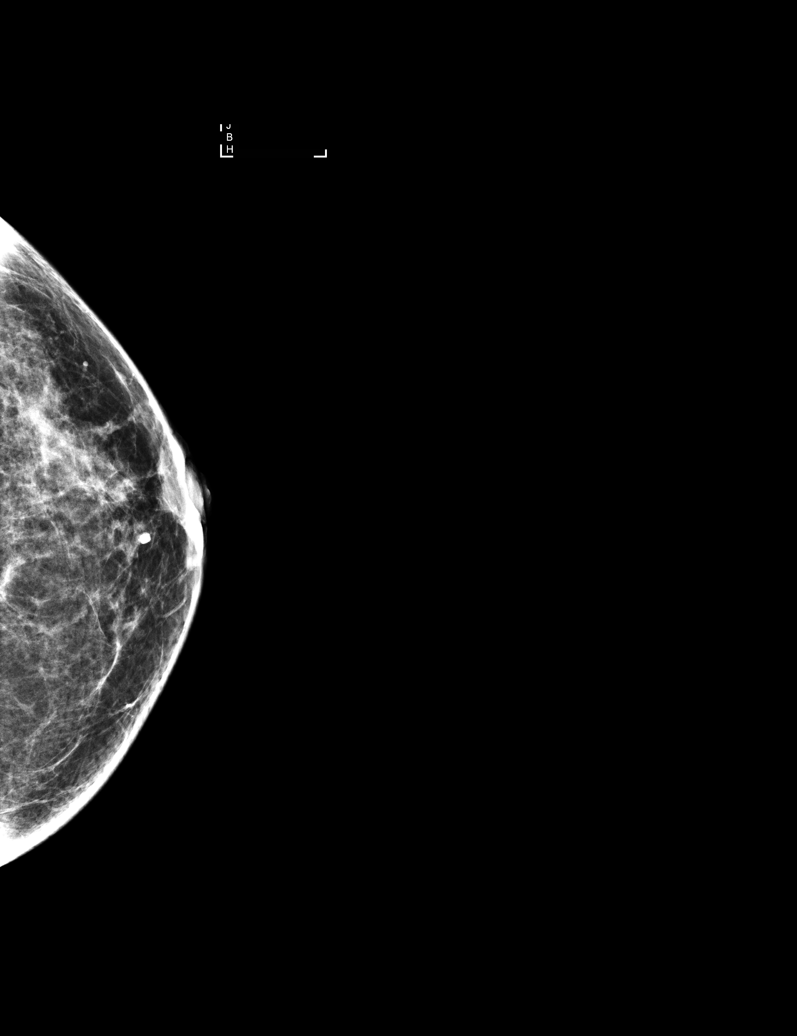

[5 of 5 positions shown; findings below may reference images not displayed]

ACR Breast Density Category b: There are scattered areas of
fibroglandular density.
FINDINGS: Biopsy site marker within the right breast is stable in position.
There are no new dominant masses, suspicious calcifications or
secondary signs of malignancy within either breast. Images were
processed with CAD.
IMPRESSION: No mammographic evidence of malignancy. A result letter of this
screening mammogram will be mailed directly to the patient.

RECOMMENDATION:
Screening mammogram in one year. (Code:[IJ])

BI-RADS CATEGORY  2: Benign.

## 2015-01-21 ENCOUNTER — Ambulatory Visit (INDEPENDENT_AMBULATORY_CARE_PROVIDER_SITE_OTHER): Payer: PPO | Admitting: Family Medicine

## 2015-01-21 ENCOUNTER — Other Ambulatory Visit: Payer: 59

## 2015-01-21 ENCOUNTER — Ambulatory Visit (INDEPENDENT_AMBULATORY_CARE_PROVIDER_SITE_OTHER)
Admission: RE | Admit: 2015-01-21 | Discharge: 2015-01-21 | Disposition: A | Discharge status: Home / Self Care | Payer: PPO | Source: Ambulatory Visit | Attending: Family Medicine | Admitting: Family Medicine

## 2015-01-21 ENCOUNTER — Encounter: Payer: Self-pay | Admitting: Family Medicine

## 2015-01-21 VITALS — BP 142/86 | HR 69 | Temp 98.1°F | Wt 176.0 lb

## 2015-01-21 DIAGNOSIS — M79672 Pain in left foot: Secondary | ICD-10-CM | POA: Diagnosis not present

## 2015-01-21 IMAGING — CR DG FOOT 2V*L*
2 series · 2 of 2 positions shown · non-contrast
Comparison: None.

CLINICAL DATA: Tenderness of skin between second and third MTP
joints.

EXAM:
LEFT FOOT - 2 VIEW

[view not recorded (1 of 2)]
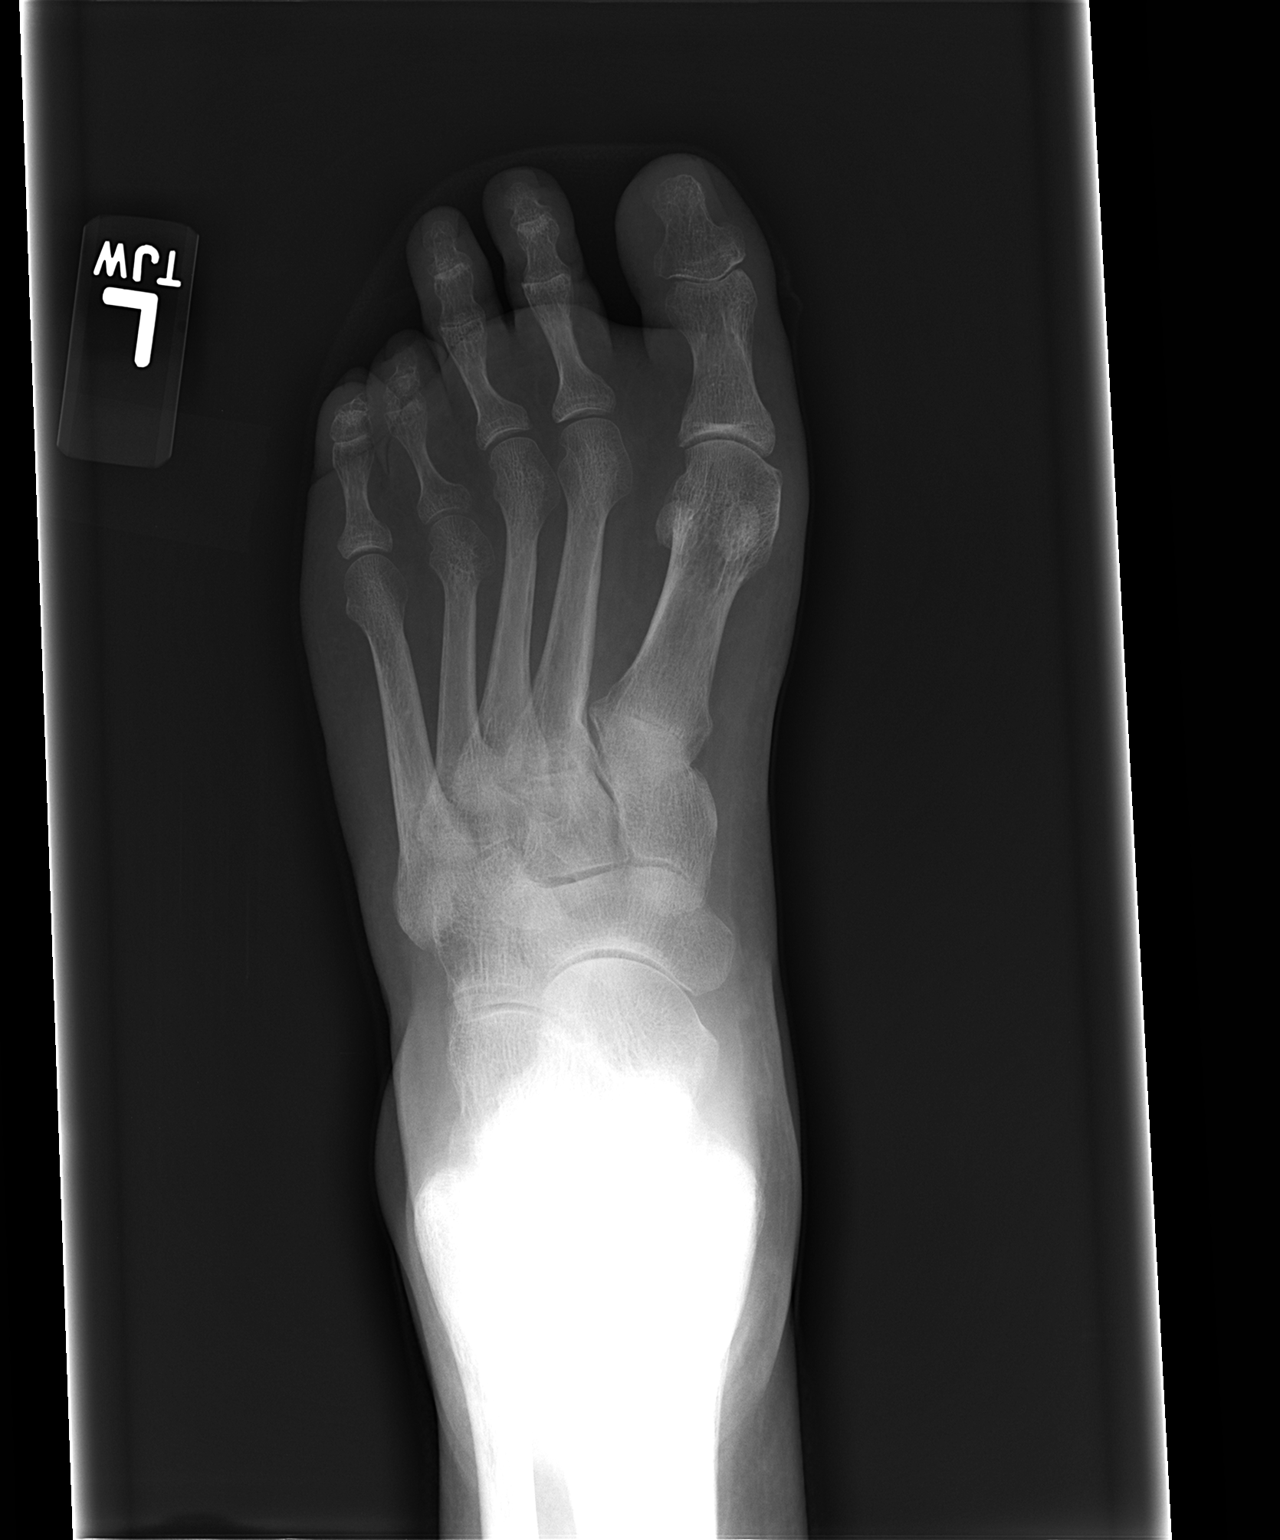

[view not recorded (2 of 2)]
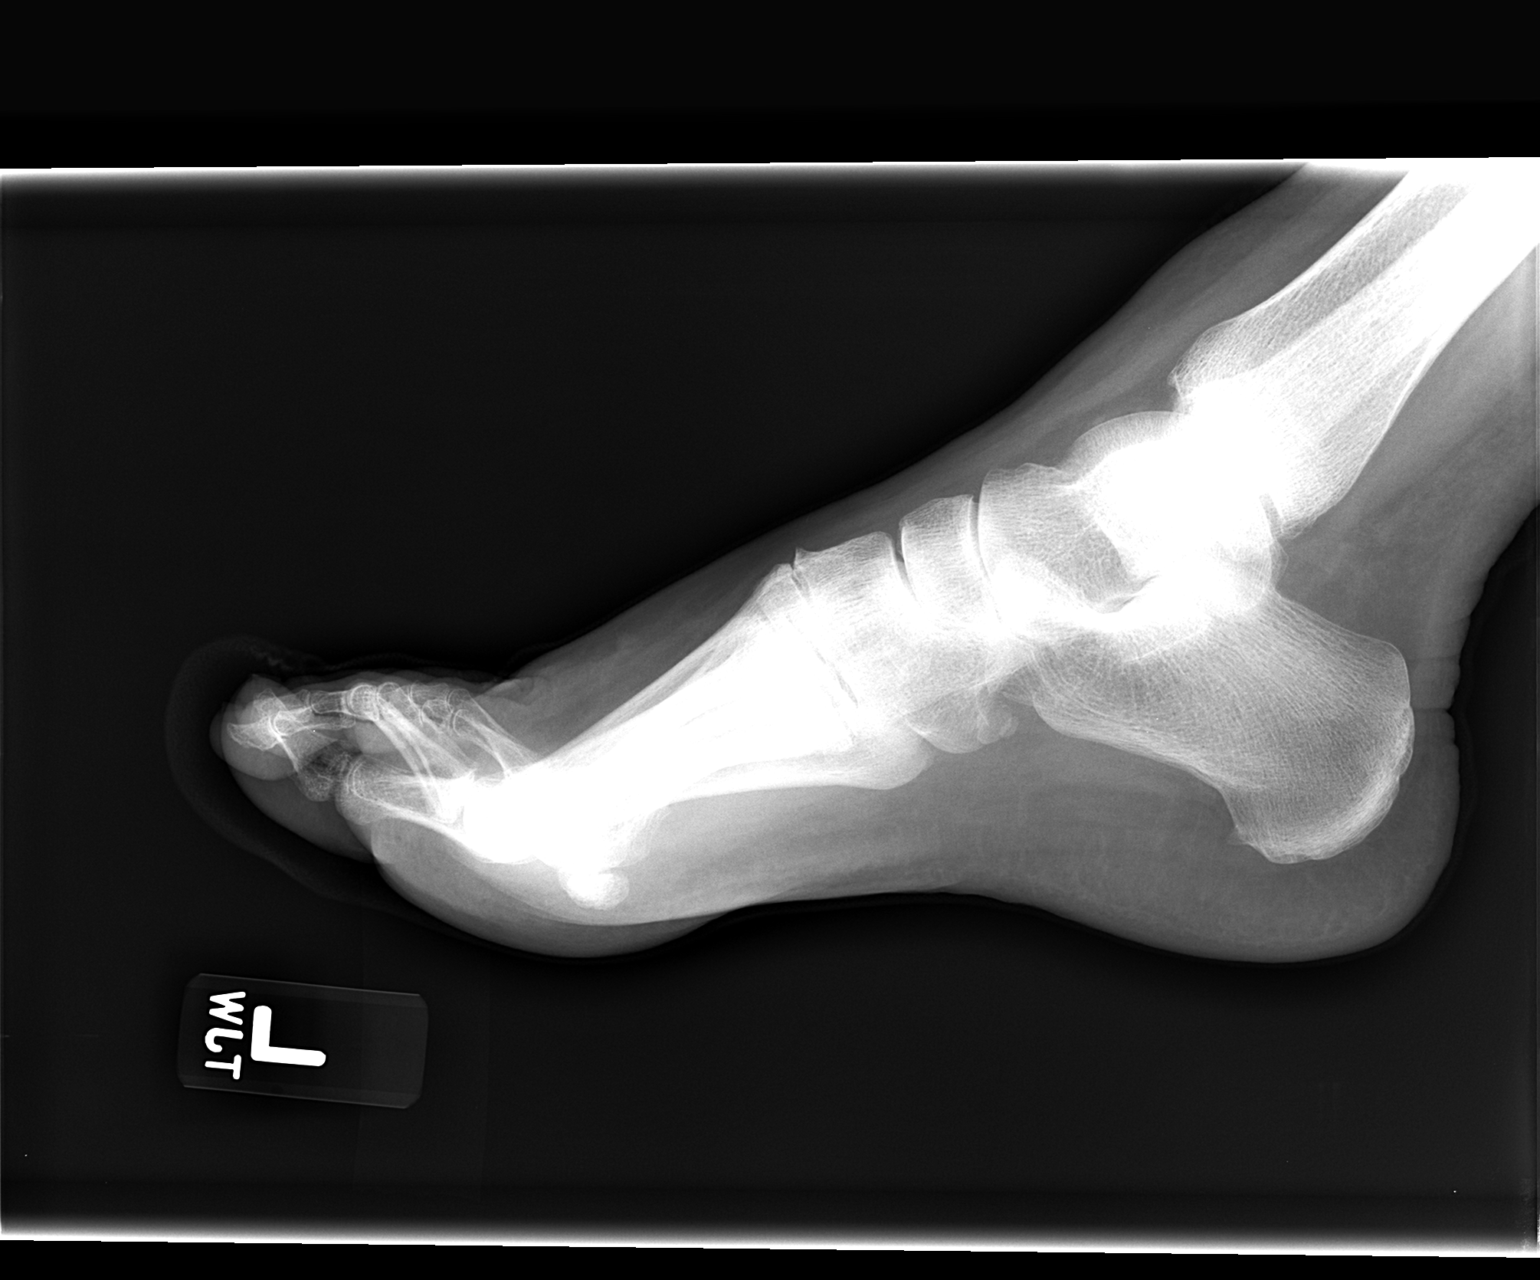

[2 of 2 positions shown; findings below may reference images not displayed]

FINDINGS: There is no evidence of fracture or dislocation. There is no
evidence of arthropathy or other focal bone abnormality. Soft
tissues are unremarkable. No soft tissue foreign body is identified.
IMPRESSION: Negative.

## 2015-01-21 MED ORDER — DICLOFENAC SODIUM 1 % TD GEL: 1.0000 "application " | Freq: Three times a day (TID) | TRANSDERMAL | Status: DC

## 2015-01-21 NOTE — Progress Notes (Signed)
Pre visit review using our clinic review tool, if applicable. No additional management support is needed unless otherwise documented below in the visit note. 

## 2015-01-21 NOTE — Patient Instructions (Addendum)
xrays ok Treat with warm compresses.  Ok to use voltaren gel topical anti inflammatory. If worsening or growing or any concern for infection, let us know.

## 2015-01-21 NOTE — Progress Notes (Signed)
   BP 142/86 mmHg  Pulse 69  Temp(Src) 98.1 F (36.7 C) (Oral)  Wt 176 lb (79.833 kg)  SpO2 98%   CC: L foot sole cyst  Subjective:    Patient ID: Elizabeth Curtis, female    DOB: [birth year], 64 y.o.   MRN: [id number]  HPI: Elizabeth Curtis is a 64 y.o. female presenting on 01/21/2015 for Cyst   4-6 wk h/o lump on bottom of left foot. Soreness worse with more walking or standing. Mild numbness at middle toe. Lump can increase in size.   No shooting pain up leg.  No redness or warmth of nodule.   H/o L middle toe fracture several years back, this healed well.   She did worked outside in her yard a lot this fall season.  Relevant past medical, surgical, family and social history reviewed and updated as indicated. Interim medical history since our last visit reviewed. Allergies and medications reviewed and updated. Current Outpatient Prescriptions on File Prior to Visit  Medication Sig  . amLODipine (NORVASC) 5 MG tablet TAKE 1 TABLET AT BEDTIME  . Cholecalciferol (VITAMIN D3) 1000 UNITS CHEW Chew 1 tablet by mouth daily.  . Cyanocobalamin (VITAMIN B 12 PO) Take 2,500 mcg by mouth daily.  . enalapril (VASOTEC) 20 MG tablet TAKE 1 TABLET TWICE A DAY  . fish oil-omega-3 fatty acids 1000 MG capsule Take 1 g by mouth daily.   Marland Kitchen glimepiride (AMARYL) 4 MG tablet TAKE ONE-HALF (1/2) TABLET ONCE DAILY  . glucose blood (FREESTYLE LITE) test strip Use as instructed  . metFORMIN (GLUCOPHAGE) 1000 MG tablet TAKE ONE-HALF (1/2) TABLET EVERY MORNING AND ONE TABLET EVERY EVENING   No current facility-administered medications on file prior to visit.    Review of Systems Per HPI unless specifically indicated in ROS section     Objective:    BP 142/86 mmHg  Pulse 69  Temp(Src) 98.1 F (36.7 C) (Oral)  Wt 176 lb (79.833 kg)  SpO2 98%  Wt Readings from Last 3 Encounters:  01/21/15 176 lb (79.833 kg)  06/18/14 175 lb 8 oz (79.606 kg)  05/29/14 175 lb (79.379 kg)    Physical Exam    Constitutional: She appears well-developed and well-nourished. No distress.  Musculoskeletal: She exhibits no edema.  Small firm tender nodule L sole between 2nd/3rd MTP  FROM at toes. No pain with axial loading.  Neurological:  Mild numbness to light touch at tip of 2nd left toe  Skin: Skin is warm and dry. No rash noted. No erythema.  Nursing note and vitals reviewed.  Results for orders placed or performed in visit on 06/18/14  HM DIABETES EYE EXAM  Result Value Ref Range   HM Diabetic Eye Exam No Retinopathy No Retinopathy      Assessment & Plan:   Problem List Items Addressed This Visit    Foot pain, left - Primary    Pt has tender skin nodule at distal sole of L foot between 2nd and 3rd MTPs. Xray to r/o foreign object - clear on my read. Not consistent with corn/callus, wart, neuroma, or bursitis. ?angiolipoma vs small epidermal cyst. Reassured, monitor for now. rec warm compresses, ok to try voltaren gel topical anti inflammatory for when inflamed.       Relevant Orders   DG Foot 2 Views Left       Follow up plan: Return if symptoms worsen or fail to improve.

## 2015-01-21 NOTE — Assessment & Plan Note (Addendum)
Pt has tender skin nodule at distal sole of L foot between 2nd and 3rd MTPs. Xray to r/o foreign object - clear on my read. Not consistent with corn/callus, wart, neuroma, or bursitis. ?angiolipoma vs small epidermal cyst. Reassured, monitor for now. rec warm compresses, ok to try voltaren gel topical anti inflammatory for when inflamed.

## 2015-01-28 ENCOUNTER — Encounter: Payer: 59 | Admitting: Family Medicine

## 2015-02-25 DIAGNOSIS — S83282A Other tear of lateral meniscus, current injury, left knee, initial encounter: Secondary | ICD-10-CM | POA: Diagnosis not present

## 2015-03-01 ENCOUNTER — Other Ambulatory Visit: Payer: Self-pay | Admitting: Family Medicine

## 2015-03-01 ENCOUNTER — Other Ambulatory Visit (INDEPENDENT_AMBULATORY_CARE_PROVIDER_SITE_OTHER): Payer: PPO

## 2015-03-01 DIAGNOSIS — Z1159 Encounter for screening for other viral diseases: Secondary | ICD-10-CM

## 2015-03-01 DIAGNOSIS — E119 Type 2 diabetes mellitus without complications: Secondary | ICD-10-CM

## 2015-03-01 DIAGNOSIS — E785 Hyperlipidemia, unspecified: Secondary | ICD-10-CM | POA: Diagnosis not present

## 2015-03-01 DIAGNOSIS — R946 Abnormal results of thyroid function studies: Secondary | ICD-10-CM

## 2015-03-01 DIAGNOSIS — I10 Essential (primary) hypertension: Secondary | ICD-10-CM | POA: Diagnosis not present

## 2015-03-01 LAB — TSH: TSH: 1.01 u[IU]/mL (ref 0.35–4.50)

## 2015-03-01 LAB — LIPID PANEL
CHOL/HDL RATIO: 5
Cholesterol: 196 mg/dL (ref 0–200)
HDL: 40.2 mg/dL (ref >39.00)
LDL Cholesterol: 119 mg/dL — ABNORMAL HIGH (ref 0–99)
NONHDL: 155.65
Triglycerides: 183 mg/dL — ABNORMAL HIGH (ref 0.0–149.0)
VLDL: 36.6 mg/dL (ref 0.0–40.0)

## 2015-03-01 LAB — HEMOGLOBIN A1C: Hgb A1c MFr Bld: 7.5 % — ABNORMAL HIGH (ref 4.6–6.5)

## 2015-03-01 LAB — T4, FREE: Free T4: 0.99 ng/dL (ref 0.60–1.60)

## 2015-03-01 LAB — BASIC METABOLIC PANEL
BUN: 13 mg/dL (ref 6–23)
CHLORIDE: 103 meq/L (ref 96–112)
CO2: 30 meq/L (ref 19–32)
Calcium: 9.4 mg/dL (ref 8.4–10.5)
Creatinine, Ser: 0.71 mg/dL (ref 0.40–1.20)
GFR: 87.77 mL/min (ref >60.00)
GLUCOSE: 166 mg/dL — AB (ref 70–99)
POTASSIUM: 4.2 meq/L (ref 3.5–5.1)
SODIUM: 141 meq/L (ref 135–145)

## 2015-03-01 LAB — MICROALBUMIN / CREATININE URINE RATIO
Creatinine,U: 64.3 mg/dL
Microalb Creat Ratio: 1.1 mg/g (ref 0.0–30.0)
Microalb, Ur: <0.7 mg/dL (ref 0.0–1.9)

## 2015-03-02 LAB — HEPATITIS C ANTIBODY: HCV AB: NEGATIVE

## 2015-03-07 ENCOUNTER — Ambulatory Visit (INDEPENDENT_AMBULATORY_CARE_PROVIDER_SITE_OTHER): Payer: PPO | Admitting: Family Medicine

## 2015-03-07 ENCOUNTER — Encounter: Payer: Self-pay | Admitting: Family Medicine

## 2015-03-07 VITALS — BP 150/88 | HR 64 | Temp 98.2°F | Wt 177.8 lb

## 2015-03-07 DIAGNOSIS — R946 Abnormal results of thyroid function studies: Secondary | ICD-10-CM

## 2015-03-07 DIAGNOSIS — S96912A Strain of unspecified muscle and tendon at ankle and foot level, left foot, initial encounter: Secondary | ICD-10-CM | POA: Insufficient documentation

## 2015-03-07 DIAGNOSIS — E785 Hyperlipidemia, unspecified: Secondary | ICD-10-CM

## 2015-03-07 DIAGNOSIS — E2839 Other primary ovarian failure: Secondary | ICD-10-CM

## 2015-03-07 DIAGNOSIS — Z23 Encounter for immunization: Secondary | ICD-10-CM | POA: Diagnosis not present

## 2015-03-07 DIAGNOSIS — Z0000 Encounter for general adult medical examination without abnormal findings: Secondary | ICD-10-CM

## 2015-03-07 DIAGNOSIS — Z7189 Other specified counseling: Secondary | ICD-10-CM | POA: Insufficient documentation

## 2015-03-07 DIAGNOSIS — E119 Type 2 diabetes mellitus without complications: Secondary | ICD-10-CM

## 2015-03-07 DIAGNOSIS — S8992XD Unspecified injury of left lower leg, subsequent encounter: Secondary | ICD-10-CM

## 2015-03-07 DIAGNOSIS — I10 Essential (primary) hypertension: Secondary | ICD-10-CM

## 2015-03-07 DIAGNOSIS — S8992XA Unspecified injury of left lower leg, initial encounter: Secondary | ICD-10-CM | POA: Insufficient documentation

## 2015-03-07 MED ORDER — GLIMEPIRIDE 4 MG PO TABS: 4.0000 mg | ORAL_TABLET | Freq: Every day | ORAL | Status: DC

## 2015-03-07 MED ORDER — ENALAPRIL MALEATE 20 MG PO TABS: 20.0000 mg | ORAL_TABLET | Freq: Two times a day (BID) | ORAL | Status: DC

## 2015-03-07 MED ORDER — AMLODIPINE BESYLATE 5 MG PO TABS: 5.0000 mg | ORAL_TABLET | Freq: Every day | ORAL | Status: DC

## 2015-03-07 MED ORDER — METFORMIN HCL ER (OSM) 1000 MG PO TB24: 1000.0000 mg | ORAL_TABLET | Freq: Every day | ORAL | Status: DC

## 2015-03-07 NOTE — Patient Instructions (Addendum)
We will refer you for bone density scan at [facility].  Call your insurance about the shingles shot to see if it is covered or how much it would cost and where is cheaper (here or pharmacy).  If you want to receive here, call for nurse visit.  prevnar today. Start wearing ankle brace on left ankle for next 2 weeks. I do think you had ankle strain. Start monitoring blood pressure at home and let me know if consistently >140/90. Take glimepiride 49m daily in the morning - watch for low sugars. Try extended release metformin sent to pharmacy - we may need to fill out prior authorization.  Bring me copy of advanced directive to update your chart.  Return in three to four months for follow up.   Health Maintenance, Female Adopting a healthy lifestyle and getting preventive care can go a long way to promote health and wellness. Talk with your health care provider about what schedule of regular examinations is right for you. This is a good chance for you to check in with your provider about disease prevention and staying healthy. In between checkups, there are plenty of things you can do on your own. Experts have done a lot of research about which lifestyle changes and preventive measures are most likely to keep you healthy. Ask your health care provider for more information. WEIGHT AND DIET  Eat a healthy diet  Be sure to include plenty of vegetables, fruits, low-fat dairy products, and lean protein.  Do not eat a lot of foods high in solid fats, added sugars, or salt.  Get regular exercise. This is one of the most important things you can do for your health.  Most adults should exercise for at least 150 minutes each week. The exercise should increase your heart rate and make you sweat (moderate-intensity exercise).  Most adults should also do strengthening exercises at least twice a week. This is in addition to the moderate-intensity exercise.  Maintain a healthy weight  Body mass index (BMI) is  a measurement that can be used to identify possible weight problems. It estimates body fat based on height and weight. Your health care provider can help determine your BMI and help you achieve or maintain a healthy weight.  For females 280years of age and older:   A BMI below 18.5 is considered underweight.  A BMI of 18.5 to 24.9 is normal.  A BMI of 25 to 29.9 is considered overweight.  A BMI of 30 and above is considered obese.  Watch levels of cholesterol and blood lipids  You should start having your blood tested for lipids and cholesterol at 66years of age, then have this test every 5 years.  You may need to have your cholesterol levels checked more often if:  Your lipid or cholesterol levels are high.  You are older than 66years of age.  You are at high risk for heart disease.  CANCER SCREENING   Lung Cancer  Lung cancer screening is recommended for adults [id number] old who are at high risk for lung cancer because of a history of smoking.  A yearly low-dose CT scan of the lungs is recommended for people who:  Currently smoke.  Have quit within the past 15 years.  Have at least a 30-pack-year history of smoking. A pack year is smoking an average of one pack of cigarettes a day for 1 year.  Yearly screening should continue until it has been 15 years since you quit.  Yearly screening should stop if you develop a health problem that would prevent you from having lung cancer treatment.  Breast Cancer  Practice breast self-awareness. This means understanding how your breasts normally appear and feel.  It also means doing regular breast self-exams. Let your health care provider know about any changes, no matter how small.  If you are in your 20s or 30s, you should have a clinical breast exam (CBE) by a health care provider every 1-3 years as part of a regular health exam.  If you are 38 or older, have a CBE every year. Also consider having a breast X-ray  (mammogram) every year.  If you have a family history of breast cancer, talk to your health care provider about genetic screening.  If you are at high risk for breast cancer, talk to your health care provider about having an MRI and a mammogram every year.  Breast cancer gene (BRCA) assessment is recommended for women who have family members with BRCA-related cancers. BRCA-related cancers include:  Breast.  Ovarian.  Tubal.  Peritoneal cancers.  Results of the assessment will determine the need for genetic counseling and BRCA1 and BRCA2 testing. Cervical Cancer Your health care provider may recommend that you be screened regularly for cancer of the pelvic organs (ovaries, uterus, and vagina). This screening involves a pelvic examination, including checking for microscopic changes to the surface of your cervix (Pap test). You may be encouraged to have this screening done every 3 years, beginning at age 7.  For women ages 22-65, health care providers may recommend pelvic exams and Pap testing every 3 years, or they may recommend the Pap and pelvic exam, combined with testing for human papilloma virus (HPV), every 5 years. Some types of HPV increase your risk of cervical cancer. Testing for HPV may also be done on women of any age with unclear Pap test results.  Other health care providers may not recommend any screening for nonpregnant women who are considered low risk for pelvic cancer and who do not have symptoms. Ask your health care provider if a screening pelvic exam is right for you.  If you have had past treatment for cervical cancer or a condition that could lead to cancer, you need Pap tests and screening for cancer for at least 20 years after your treatment. If Pap tests have been discontinued, your risk factors (such as having a new sexual partner) need to be reassessed to determine if screening should resume. Some women have medical problems that increase the chance of getting  cervical cancer. In these cases, your health care provider may recommend more frequent screening and Pap tests. Colorectal Cancer  This type of cancer can be detected and often prevented.  Routine colorectal cancer screening usually begins at 66 years of age and continues through 66 years of age.  Your health care provider may recommend screening at an earlier age if you have risk factors for colon cancer.  Your health care provider may also recommend using home test kits to check for hidden blood in the stool.  A small camera at the end of a tube can be used to examine your colon directly (sigmoidoscopy or colonoscopy). This is done to check for the earliest forms of colorectal cancer.  Routine screening usually begins at age 24.  Direct examination of the colon should be repeated every 5-10 years through 66 years of age. However, you may need to be screened more often if early forms of precancerous polyps or  small growths are found. Skin Cancer  Check your skin from head to toe regularly.  Tell your health care provider about any new moles or changes in moles, especially if there is a change in a mole's shape or color.  Also tell your health care provider if you have a mole that is larger than the size of a pencil eraser.  Always use sunscreen. Apply sunscreen liberally and repeatedly throughout the day.  Protect yourself by wearing long sleeves, pants, a wide-brimmed hat, and sunglasses whenever you are outside. HEART DISEASE, DIABETES, AND HIGH BLOOD PRESSURE   High blood pressure causes heart disease and increases the risk of stroke. High blood pressure is more likely to develop in:  People who have blood pressure in the high end of the normal range (130-139/85-89 mm Hg).  People who are overweight or obese.  People who are African American.  If you are 21-55 years of age, have your blood pressure checked every 3-5 years. If you are 95 years of age or older, have your blood  pressure checked every year. You should have your blood pressure measured twice--once when you are at a hospital or clinic, and once when you are not at a hospital or clinic. Record the average of the two measurements. To check your blood pressure when you are not at a hospital or clinic, you can use:  An automated blood pressure machine at a pharmacy.  A home blood pressure monitor.  If you are between 51 years and 69 years old, ask your health care provider if you should take aspirin to prevent strokes.  Have regular diabetes screenings. This involves taking a blood sample to check your fasting blood sugar level.  If you are at a normal weight and have a low risk for diabetes, have this test once every three years after 66 years of age.  If you are overweight and have a high risk for diabetes, consider being tested at a younger age or more often. PREVENTING INFECTION  Hepatitis B  If you have a higher risk for hepatitis B, you should be screened for this virus. You are considered at high risk for hepatitis B if:  You were born in a country where hepatitis B is common. Ask your health care provider which countries are considered high risk.  Your parents were born in a high-risk country, and you have not been immunized against hepatitis B (hepatitis B vaccine).  You have HIV or AIDS.  You use needles to inject street drugs.  You live with someone who has hepatitis B.  You have had sex with someone who has hepatitis B.  You get hemodialysis treatment.  You take certain medicines for conditions, including cancer, organ transplantation, and autoimmune conditions. Hepatitis C  Blood testing is recommended for:  Everyone born from [birth year] through 1966.  Anyone with known risk factors for hepatitis C. Sexually transmitted infections (STIs)  You should be screened for sexually transmitted infections (STIs) including gonorrhea and chlamydia if:  You are sexually active and are  younger than 66 years of age.  You are older than 66 years of age and your health care provider tells you that you are at risk for this type of infection.  Your sexual activity has changed since you were last screened and you are at an increased risk for chlamydia or gonorrhea. Ask your health care provider if you are at risk.  If you do not have HIV, but are at risk, it may be  recommended that you take a prescription medicine daily to prevent HIV infection. This is called pre-exposure prophylaxis (PrEP). You are considered at risk if:  You are sexually active and do not regularly use condoms or know the HIV status of your partner(s).  You take drugs by injection.  You are sexually active with a partner who has HIV. Talk with your health care provider about whether you are at high risk of being infected with HIV. If you choose to begin PrEP, you should first be tested for HIV. You should then be tested every 3 months for as long as you are taking PrEP.  PREGNANCY   If you are premenopausal and you may become pregnant, ask your health care provider about preconception counseling.  If you may become pregnant, take 400 to 800 micrograms (mcg) of folic acid every day.  If you want to prevent pregnancy, talk to your health care provider about birth control (contraception). OSTEOPOROSIS AND MENOPAUSE   Osteoporosis is a disease in which the bones lose minerals and strength with aging. This can result in serious bone fractures. Your risk for osteoporosis can be identified using a bone density scan.  If you are 25 years of age or older, or if you are at risk for osteoporosis and fractures, ask your health care provider if you should be screened.  Ask your health care provider whether you should take a calcium or vitamin D supplement to lower your risk for osteoporosis.  Menopause may have certain physical symptoms and risks.  Hormone replacement therapy may reduce some of these symptoms and  risks. Talk to your health care provider about whether hormone replacement therapy is right for you.  HOME CARE INSTRUCTIONS   Schedule regular health, dental, and eye exams.  Stay current with your immunizations.   Do not use any tobacco products including cigarettes, chewing tobacco, or electronic cigarettes.  If you are pregnant, do not drink alcohol.  If you are breastfeeding, limit how much and how often you drink alcohol.  Limit alcohol intake to no more than 1 drink per day for nonpregnant women. One drink equals 12 ounces of beer, 5 ounces of wine, or 1 ounces of hard liquor.  Do not use street drugs.  Do not share needles.  Ask your health care provider for help if you need support or information about quitting drugs.  Tell your health care provider if you often feel depressed.  Tell your health care provider if you have ever been abused or do not feel safe at home.   This information is not intended to replace advice given to you by your health care provider. Make sure you discuss any questions you have with your health care provider.   Document Released: 08/11/2010 Document Revised: 02/16/2014 Document Reviewed: 12/28/2012 Elsevier Interactive Patient Education [employer].

## 2015-03-07 NOTE — Assessment & Plan Note (Signed)
Advanced directive: has at home. Husband is HCPOA. Will bring copy to update chart.

## 2015-03-07 NOTE — Assessment & Plan Note (Addendum)
Chronic, deteriorated. Dietary indiscretions during recent holidays, then injury has kept her inactive. Foot exam today. Increase amaryl to 4mg  in am with breakfast Change metformin to ER fortamet 1000mg  nightly (interested in trial for better control and to see effect on some intermittent diarrhea she has been having)

## 2015-03-07 NOTE — Assessment & Plan Note (Signed)
Exam consistent with L ankle sprain. Treat with ASO brace x at least 2 wks and exercises provided from [facility] pt advisor.

## 2015-03-07 NOTE — Assessment & Plan Note (Addendum)
Chronic, stable. Reviewed levels. Not at goal LDL <100. Motivated to improve diet.

## 2015-03-07 NOTE — Assessment & Plan Note (Signed)
Anticipate anterior superior tibial bony contusion. supprotive care discussed. Continues mobic prescribed by ortho.

## 2015-03-07 NOTE — Assessment & Plan Note (Addendum)
I have personally reviewed the Medicare Annual Wellness questionnaire and have noted 1. The patient's medical and social history 2. Their use of alcohol, tobacco or illicit drugs 3. Their current medications and supplements 4. The patient's functional ability including ADL's, fall risks, home safety risks and hearing or visual impairment. Cognitive function has been assessed and addressed as indicated.  5. Diet and physical activity 6. Evidence for depression or mood disorders The patients weight, height, BMI have been recorded in the chart. I have made referrals, counseling and provided education to the patient based on review of the above and I have provided the pt with a written personalized care plan for preventive services. Provider list updated.. See scanned questionairre as needed for further documentation. Reviewed preventative protocols and updated unless pt declined.   EKG - NSR rate 80s, normal axis, intervals, no acute ST/T changes, T inversions anteriorly

## 2015-03-07 NOTE — Addendum Note (Signed)
Addended by: Ria Bush on: 03/07/2015 12:53 PM   Modules accepted: Orders, SmartSet

## 2015-03-07 NOTE — Assessment & Plan Note (Signed)
Preventative protocols reviewed and updated unless pt declined. Discussed healthy diet and lifestyle.  

## 2015-03-07 NOTE — Assessment & Plan Note (Signed)
Chronic, elevated recently. Advised start monitoring at home. If persistently elevated, update me to titrate antihypertensive regimen.

## 2015-03-07 NOTE — Progress Notes (Addendum)
BP 150/88 mmHg  Pulse 64  Temp(Src) 98.2 F (36.8 C) (Oral)  Wt 177 lb 12 oz (80.627 kg)   CC: welcome to medicare visit  Subjective:    Patient ID: Elizabeth Curtis, female    DOB: 06-29-1950, 65 y.o.   MRN: [id number]  HPI: Elizabeth Curtis is a 65 y.o. female presenting on 03/07/2015 for Annual Exam   Recently twisted L ankle landing on knee. Saw Dr Noemi Chapel with stable xrays - started on mobic. This was in the last several weeks.   Has not been monitoring bp at home. High last few readings. Compliant with meds. Doesn't check bp regularly at home.   Brings log of sugars. Fasting 130-150s. Taking amaryl 2mg  daily and metformin 500mg  in am and 1000mg  in pm. Asks about ER metformin.  Diabetic Foot Exam - Simple   Simple Foot Form  Diabetic Foot exam was performed with the following findings:  Yes 03/07/2015 12:27 PM  Visual Inspection  No deformities, no ulcerations, no other skin breakdown bilaterally:  Yes  Sensation Testing  Intact to touch and monofilament testing bilaterally:  Yes  Pulse Check  Posterior Tibialis and Dorsalis pulse intact bilaterally:  Yes  Comments       Passed hearing screen.  Vision screen at eye center.  Falls screen: x1 with injury - L lower leg. Depression screen: passed.   Preventative: Colonoscopy - 2010 [place], 1 polyp, 64mm lipoma, o/w normal, rec rpt 10 yrs.  Well woman at [facility], new MD. Last saw 12/2014. Pap normal 2016. LMP - 2010. menopausal.  Mammogram - Birads2 01/2015 DEXA - DUE requests at [facility] Flu shot yearly. Tetanus 2010.  Pneumovax - 09/2011 - felt ill after this, agrees to prevnar today. Shingles shot - interested.  Advanced directive: has at home. Husband is HCPOA. Will bring copy to update chart.  Seat belt use discussed Sunscreen use discussed, no changing skin lesions. 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  Activity: no regular exercise  Diet: good water, daily fruits/vegetables  Relevant past medical, surgical, family and social history reviewed and updated as indicated. Interim medical history since our last visit reviewed. Allergies and medications reviewed and updated. Current Outpatient Prescriptions on File Prior to Visit  Medication Sig  . Cholecalciferol (VITAMIN D3) 1000 UNITS CHEW Chew 1 tablet by mouth daily.  . Cyanocobalamin (VITAMIN B 12 PO) Take 2,500 mcg by mouth daily.  . diclofenac sodium (VOLTAREN) 1 % GEL Apply 1 application topically 3 (three) times daily.  Marland Kitchen glucose blood (FREESTYLE LITE) test strip Use as instructed  . fish oil-omega-3 fatty acids 1000 MG capsule Take 1 g by mouth daily. Reported on 03/07/2015   No current facility-administered medications on file prior to visit.    Review of Systems  Constitutional: Negative for fever, chills, activity change, appetite change, fatigue and unexpected weight change.  HENT: Negative for hearing loss.   Eyes: Negative for visual disturbance.  Respiratory: Negative for cough, chest tightness, shortness of breath and wheezing.   Cardiovascular: Negative for chest pain, palpitations and leg swelling.  Gastrointestinal: Negative for nausea, vomiting, abdominal pain, diarrhea, constipation, blood in stool and abdominal distention.  Genitourinary: Negative for hematuria and difficulty urinating.  Musculoskeletal: Negative for myalgias, arthralgias and neck pain.  Skin: Negative for rash.  Neurological: Negative for dizziness, seizures, syncope and headaches.  Hematological:  Negative for adenopathy. Does not bruise/bleed easily.  Psychiatric/Behavioral: Negative for dysphoric mood. The patient is not nervous/anxious.    Per HPI unless specifically indicated in ROS section     Objective:    BP 150/88 mmHg  Pulse 64  Temp(Src) 98.2 F (36.8 C) (Oral)  Wt 177 lb 12  oz (80.627 kg)  Wt Readings from Last 3 Encounters:  03/07/15 177 lb 12 oz (80.627 kg)  01/21/15 176 lb (79.833 kg)  06/18/14 175 lb 8 oz (79.606 kg)    Physical Exam  Constitutional: She is oriented to person, place, and time. She appears well-developed and well-nourished. No distress.  HENT:  Head: Normocephalic and atraumatic.  Right Ear: Hearing, tympanic membrane, external ear and ear canal normal.  Left Ear: Hearing, tympanic membrane, external ear and ear canal normal.  Nose: Nose normal.  Mouth/Throat: Uvula is midline, oropharynx is clear and moist and mucous membranes are normal. No oropharyngeal exudate, posterior oropharyngeal edema or posterior oropharyngeal erythema.  Eyes: Conjunctivae and EOM are normal. Pupils are equal, round, and reactive to light. No scleral icterus.  Neck: Normal range of motion. Neck supple. Carotid bruit is not present. No thyromegaly present.  Cardiovascular: Normal rate, regular rhythm, normal heart sounds and intact distal pulses.   No murmur heard. Pulses:      Radial pulses are 2+ on the right side, and 2+ on the left side.  Pulmonary/Chest: Effort normal and breath sounds normal. No respiratory distress. She has no wheezes. She has no rales.  Abdominal: Soft. Bowel sounds are normal. She exhibits no distension and no mass. There is no tenderness. There is no rebound and no guarding.  Musculoskeletal: Normal range of motion. She exhibits no edema.  See HPI if foot exam done R leg WNL L knee FROM ext/ flexion with mild crepitus  Tender to palpation lateral malleolus without ligament laxity on left No pain at base of 5th MT on left  Lymphadenopathy:    She has no cervical adenopathy.  Neurological: She is alert and oriented to person, place, and time.  CN grossly intact, station and gait intact Recall 3/3 Calculation 5/5 serial 3s  Skin: Skin is warm and dry. No rash noted.  Bruising noted anteriorly at knee and ankle/foot  Psychiatric:  She has a normal mood and affect. Her behavior is normal. Judgment and thought content normal.  Nursing note and vitals reviewed.  Results for orders placed or performed in visit on 03/01/15  Lipid panel  Result Value Ref Range   Cholesterol 196 0 - 200 mg/dL   Triglycerides 183.0 (H) 0.0 - 149.0 mg/dL   HDL 40.20 >39.00 mg/dL   VLDL 36.6 0.0 - 40.0 mg/dL   LDL Cholesterol 119 (H) 0 - 99 mg/dL   Total CHOL/HDL Ratio 5    NonHDL 155.65   TSH  Result Value Ref Range   TSH 1.01 0.35 - 4.50 uIU/mL  Hemoglobin A1c  Result Value Ref Range   Hgb A1c MFr Bld 7.5 (H) 4.6 - 6.5 %  T4, free  Result Value Ref Range   Free T4 0.99 0.60 - 1.60 ng/dL  Microalbumin / creatinine urine ratio  Result Value Ref Range   Microalb, Ur <0.7 0.0 - 1.9 mg/dL   Creatinine,U 64.3 mg/dL   Microalb Creat Ratio 1.1 0.0 - 30.0 mg/g  Basic metabolic panel  Result Value Ref Range   Sodium 141 135 - 145 mEq/L   Potassium 4.2 3.5 - 5.1 mEq/L   Chloride  103 96 - 112 mEq/L   CO2 30 19 - 32 mEq/L   Glucose, Bld 166 (H) 70 - 99 mg/dL   BUN 13 6 - 23 mg/dL   Creatinine, Ser 0.71 0.40 - 1.20 mg/dL   Calcium 9.4 8.4 - 10.5 mg/dL   GFR 87.77 >60.00 mL/min      Assessment & Plan:   Problem List Items Addressed This Visit    Welcome to Medicare preventive visit - Primary    I have personally reviewed the Medicare Annual Wellness questionnaire and have noted 1. The patient's medical and social history 2. Their use of alcohol, tobacco or illicit drugs 3. Their current medications and supplements 4. The patient's functional ability including ADL's, fall risks, home safety risks and hearing or visual impairment. Cognitive function has been assessed and addressed as indicated.  5. Diet and physical activity 6. Evidence for depression or mood disorders The patients weight, height, BMI have been recorded in the chart. I have made referrals, counseling and provided education to the patient based on review of the above  and I have provided the pt with a written personalized care plan for preventive services. Provider list updated.. See scanned questionairre as needed for further documentation. Reviewed preventative protocols and updated unless pt declined.   EKG - NSR rate 80s, normal axis, intervals, no acute ST/T changes, T inversions anteriorly      Relevant Orders   EKG 12-Lead (Completed)   Left ankle strain    Exam consistent with L ankle sprain. Treat with ASO brace x at least 2 wks and exercises provided from [facility] pt advisor.      Injury of knee, left    Anticipate anterior superior tibial bony contusion. supprotive care discussed. Continues mobic prescribed by ortho.      HLD (hyperlipidemia)    Chronic, stable. Reviewed levels. Not at goal LDL <100. Motivated to improve diet.      Relevant Medications   amLODipine (NORVASC) 5 MG tablet   enalapril (VASOTEC) 20 MG tablet   Healthcare maintenance    Preventative protocols reviewed and updated unless pt declined. Discussed healthy diet and lifestyle.       Essential hypertension, benign    Chronic, elevated recently. Advised start monitoring at home. If persistently elevated, update me to titrate antihypertensive regimen.      Relevant Medications   amLODipine (NORVASC) 5 MG tablet   enalapril (VASOTEC) 20 MG tablet   Diabetes type 2, controlled (HCC)    Chronic, deteriorated. Dietary indiscretions during recent holidays, then injury has kept her inactive. Foot exam today. Increase amaryl to 4mg  in am with breakfast Change metformin to ER fortamet 1000mg  nightly (interested in trial for better control and to see effect on some intermittent diarrhea she has been having)      Relevant Medications   enalapril (VASOTEC) 20 MG tablet   glimepiride (AMARYL) 4 MG tablet   metformin (FORTAMET) 1000 MG (OSM) 24 hr tablet   Advanced care planning/counseling discussion    Advanced directive: has at home. Husband is HCPOA. Will bring copy  to update chart.       Abnormal thyroid function test    Normal on recheck       Other Visit Diagnoses    Estrogen deficiency        Relevant Orders    DG Bone Density        Follow up plan: Return in about 3 months (around 06/05/2015), or as needed, for follow up  visit.

## 2015-03-07 NOTE — Progress Notes (Signed)
Pre visit review using our clinic review tool, if applicable. No additional management support is needed unless otherwise documented below in the visit note. 

## 2015-03-07 NOTE — Assessment & Plan Note (Signed)
Normal on recheck 

## 2015-03-07 NOTE — Addendum Note (Signed)
Addended by: Royann Shivers A on: 03/07/2015 01:32 PM   Modules accepted: Orders

## 2015-03-12 DIAGNOSIS — S83282D Other tear of lateral meniscus, current injury, left knee, subsequent encounter: Secondary | ICD-10-CM | POA: Diagnosis not present

## 2015-03-12 DIAGNOSIS — M25572 Pain in left ankle and joints of left foot: Secondary | ICD-10-CM | POA: Diagnosis not present

## 2015-03-12 DIAGNOSIS — S83242D Other tear of medial meniscus, current injury, left knee, subsequent encounter: Secondary | ICD-10-CM | POA: Diagnosis not present

## 2015-03-13 DIAGNOSIS — M8580 Other specified disorders of bone density and structure, unspecified site: Secondary | ICD-10-CM

## 2015-03-13 HISTORY — DX: Other specified disorders of bone density and structure, unspecified site: M85.80

## 2015-03-14 DIAGNOSIS — M25462 Effusion, left knee: Secondary | ICD-10-CM | POA: Diagnosis not present

## 2015-03-18 DIAGNOSIS — M25462 Effusion, left knee: Secondary | ICD-10-CM | POA: Diagnosis not present

## 2015-03-20 DIAGNOSIS — M25462 Effusion, left knee: Secondary | ICD-10-CM | POA: Diagnosis not present

## 2015-03-22 ENCOUNTER — Encounter: Payer: Self-pay | Admitting: Family Medicine

## 2015-03-22 DIAGNOSIS — Z7189 Other specified counseling: Secondary | ICD-10-CM

## 2015-03-25 DIAGNOSIS — M25462 Effusion, left knee: Secondary | ICD-10-CM | POA: Diagnosis not present

## 2015-03-27 ENCOUNTER — Ambulatory Visit
Admission: RE | Admit: 2015-03-27 | Discharge: 2015-03-27 | Disposition: A | Discharge status: Home / Self Care | Payer: PPO | Source: Ambulatory Visit | Attending: Family Medicine | Admitting: Family Medicine

## 2015-03-27 DIAGNOSIS — M8589 Other specified disorders of bone density and structure, multiple sites: Secondary | ICD-10-CM | POA: Diagnosis not present

## 2015-03-27 DIAGNOSIS — M8580 Other specified disorders of bone density and structure, unspecified site: Secondary | ICD-10-CM | POA: Insufficient documentation

## 2015-03-27 DIAGNOSIS — E2839 Other primary ovarian failure: Secondary | ICD-10-CM | POA: Diagnosis not present

## 2015-03-27 DIAGNOSIS — M25462 Effusion, left knee: Secondary | ICD-10-CM | POA: Diagnosis not present

## 2015-03-27 DIAGNOSIS — Z13820 Encounter for screening for osteoporosis: Secondary | ICD-10-CM | POA: Diagnosis not present

## 2015-03-30 ENCOUNTER — Encounter: Payer: Self-pay | Admitting: Family Medicine

## 2015-04-02 DIAGNOSIS — M25462 Effusion, left knee: Secondary | ICD-10-CM | POA: Diagnosis not present

## 2015-04-04 DIAGNOSIS — M25462 Effusion, left knee: Secondary | ICD-10-CM | POA: Diagnosis not present

## 2015-04-09 DIAGNOSIS — M25462 Effusion, left knee: Secondary | ICD-10-CM | POA: Diagnosis not present

## 2015-06-02 ENCOUNTER — Encounter: Payer: Self-pay | Admitting: Family Medicine

## 2015-06-03 ENCOUNTER — Other Ambulatory Visit (INDEPENDENT_AMBULATORY_CARE_PROVIDER_SITE_OTHER): Payer: PPO

## 2015-06-03 DIAGNOSIS — E119 Type 2 diabetes mellitus without complications: Secondary | ICD-10-CM | POA: Diagnosis not present

## 2015-06-03 LAB — HEMOGLOBIN A1C: Hgb A1c MFr Bld: 7.8 % — ABNORMAL HIGH (ref 4.6–6.5)

## 2015-06-05 ENCOUNTER — Encounter: Payer: Self-pay | Admitting: Family Medicine

## 2015-06-05 ENCOUNTER — Ambulatory Visit (INDEPENDENT_AMBULATORY_CARE_PROVIDER_SITE_OTHER): Payer: PPO | Admitting: Family Medicine

## 2015-06-05 VITALS — BP 136/70 | HR 68 | Temp 98.2°F | Wt 174.8 lb

## 2015-06-05 DIAGNOSIS — I10 Essential (primary) hypertension: Secondary | ICD-10-CM | POA: Diagnosis not present

## 2015-06-05 DIAGNOSIS — IMO0001 Reserved for inherently not codable concepts without codable children: Secondary | ICD-10-CM

## 2015-06-05 DIAGNOSIS — E1165 Type 2 diabetes mellitus with hyperglycemia: Secondary | ICD-10-CM

## 2015-06-05 DIAGNOSIS — E785 Hyperlipidemia, unspecified: Secondary | ICD-10-CM | POA: Diagnosis not present

## 2015-06-05 MED ORDER — GLIPIZIDE 5 MG PO TABS: 5.0000 mg | ORAL_TABLET | Freq: Two times a day (BID) | ORAL | Status: DC

## 2015-06-05 MED ORDER — METFORMIN HCL ER (OSM) 500 MG PO TB24: ORAL_TABLET | ORAL | Status: DC

## 2015-06-05 NOTE — Assessment & Plan Note (Addendum)
Chronic, stable. Continue current regimen. 

## 2015-06-05 NOTE — Patient Instructions (Addendum)
Stop glimepiride (amaryl). Start glipizide 5mg  twice daily with meals.  Increase foramet (metformin ER) to 500mg  with breakfast and 1000mg  with dinner.  Keep track of sugar control over next month and let me know how we're doing. Return in 3 months for follow up.

## 2015-06-05 NOTE — Assessment & Plan Note (Signed)
Check FLP next labs. Off statin.

## 2015-06-05 NOTE — Progress Notes (Signed)
BP 136/70 mmHg  Pulse 68  Temp(Src) 98.2 F (36.8 C) (Oral)  Wt 174 lb 12 oz (79.266 kg)   CC: diabetes follow up visit  Subjective:    Patient ID: Elizabeth Curtis, female    DOB: 30-Apr-1950, 65 y.o.   MRN: [id number]  HPI: Elizabeth Curtis is a 65 y.o. female presenting on 06/05/2015 for Follow-up   DM - regularly does check sugars and brings log - fasting 160-180, pre dinner 115-150, post dinner 171. Compliant with antihyperglycemic regimen which includes: fortamet ER 1000mg  nightly, amaryl 4mg  with breakfast. Denies low sugars or hypoglycemic symptoms.  Denies paresthesias. Last diabetic eye exam 04/2014 - DUE. Pending eye exam. Pneumovax: 2013.  Prevnar: 2017.  Lab Results  Component Value Date   HGBA1C 7.8* 06/03/2015   Diabetic Foot Exam - Simple   Simple Foot Form  Diabetic Foot exam was performed with the following findings:  Yes 06/05/2015  9:41 AM  Visual Inspection  No deformities, no ulcerations, no other skin breakdown bilaterally:  Yes  Sensation Testing  Intact to touch and monofilament testing bilaterally:  Yes  Pulse Check  Posterior Tibialis and Dorsalis pulse intact bilaterally:  Yes  Comments  2+ DP bilaterally       HTN - Compliant with current antihypertensive regimen of amlodipine 5mg  daily and enalapril 20mg  bid. Does not regularly check blood pressures at home: good control on intermittent checks.   HLD - off statin, off fish oil.   Lab Results  Component Value Date   LDLCALC 119* 03/01/2015     Relevant past medical, surgical, family and social history reviewed and updated as indicated. Interim medical history since our last visit reviewed. Allergies and medications reviewed and updated. Current Outpatient Prescriptions on File Prior to Visit  Medication Sig  . amLODipine (NORVASC) 5 MG tablet Take 1 tablet (5 mg total) by mouth at bedtime.  . Cholecalciferol (VITAMIN D3) 1000 UNITS CHEW Chew 1 tablet by mouth daily.  . Cyanocobalamin  (VITAMIN B 12 PO) Take 2,500 mcg by mouth daily.  . diclofenac sodium (VOLTAREN) 1 % GEL Apply 1 application topically 3 (three) times daily. (Patient taking differently: Apply 1 application topically 3 (three) times daily as needed. )  . enalapril (VASOTEC) 20 MG tablet Take 1 tablet (20 mg total) by mouth 2 (two) times daily.  Marland Kitchen glucose blood (FREESTYLE LITE) test strip Use as instructed  . OVER THE COUNTER MEDICATION Take 1 capsule by mouth 2 (two) times daily.  . fish oil-omega-3 fatty acids 1000 MG capsule Take 1 g by mouth daily. Reported on 06/05/2015  . meloxicam (MOBIC) 15 MG tablet Take 15 mg by mouth daily. Reported on 06/05/2015   No current facility-administered medications on file prior to visit.    Review of Systems Per HPI unless specifically indicated in ROS section     Objective:    BP 136/70 mmHg  Pulse 68  Temp(Src) 98.2 F (36.8 C) (Oral)  Wt 174 lb 12 oz (79.266 kg)  Wt Readings from Last 3 Encounters:  06/05/15 174 lb 12 oz (79.266 kg)  03/07/15 177 lb 12 oz (80.627 kg)  01/21/15 176 lb (79.833 kg)    Physical Exam  Constitutional: She appears well-developed and well-nourished. No distress.  HENT:  Head: Normocephalic and atraumatic.  Right Ear: External ear normal.  Left Ear: External ear normal.  Nose: Nose normal.  Mouth/Throat: Oropharynx is clear and moist. No oropharyngeal exudate.  Eyes: Conjunctivae and EOM are normal.  Pupils are equal, round, and reactive to light. No scleral icterus.  Neck: Normal range of motion. Neck supple.  Cardiovascular: Normal rate, regular rhythm, normal heart sounds and intact distal pulses.   No murmur heard. Pulmonary/Chest: Effort normal and breath sounds normal. No respiratory distress. She has no wheezes. She has no rales.  Musculoskeletal: She exhibits no edema.  See HPI for foot exam if done  Lymphadenopathy:    She has no cervical adenopathy.  Skin: Skin is warm and dry. No rash noted.  Psychiatric: She has  a normal mood and affect.  Nursing note and vitals reviewed.  Results for orders placed or performed in visit on 06/03/15  Hemoglobin A1c  Result Value Ref Range   Hgb A1c MFr Bld 7.8 (H) 4.6 - 6.5 %      Assessment & Plan:   Problem List Items Addressed This Visit    Diabetes mellitus type 2, uncontrolled ([place]) - Primary    Chronic, deteriorated despite last visit changes.  Predominant impaired fasting sugars. Will change regimen to glipizide 5mg  bid + fortamet 500mg  in am and 1000mg  in pm.  F/u 3 mo DM. Update [place] with sugars 1 mo.  Foot exam today.       Relevant Medications   metformin (FORTAMET) 500 MG (OSM) 24 hr tablet   glipiZIDE (GLUCOTROL) 5 MG tablet   Essential hypertension, benign    Chronic, stable. Continue current regimen.       HLD (hyperlipidemia)    Check FLP next labs. Off statin.           Follow up plan: Return in about 3 months (around 09/04/2015), or as needed, for follow up visit.  Ria Bush, MD

## 2015-06-05 NOTE — Assessment & Plan Note (Signed)
Chronic, deteriorated despite last visit changes.  Predominant impaired fasting sugars. Will change regimen to glipizide 5mg  bid + fortamet 500mg  in am and 1000mg  in pm.  F/u 3 mo DM. Update [place] with sugars 1 mo.  Foot exam today.

## 2015-06-05 NOTE — Progress Notes (Signed)
Pre visit review using our clinic review tool, if applicable. No additional management support is needed unless otherwise documented below in the visit note. 

## 2015-06-13 DIAGNOSIS — E119 Type 2 diabetes mellitus without complications: Secondary | ICD-10-CM | POA: Diagnosis not present

## 2015-06-13 DIAGNOSIS — Z961 Presence of intraocular lens: Secondary | ICD-10-CM | POA: Diagnosis not present

## 2015-06-13 LAB — HM DIABETES EYE EXAM

## 2015-06-14 ENCOUNTER — Encounter: Payer: Self-pay | Admitting: Family Medicine

## 2015-09-05 ENCOUNTER — Ambulatory Visit: Payer: PPO | Admitting: Family Medicine

## 2015-09-23 ENCOUNTER — Other Ambulatory Visit: Payer: Self-pay | Admitting: Family Medicine

## 2015-09-23 DIAGNOSIS — E1165 Type 2 diabetes mellitus with hyperglycemia: Principal | ICD-10-CM

## 2015-09-23 DIAGNOSIS — E785 Hyperlipidemia, unspecified: Secondary | ICD-10-CM

## 2015-09-23 DIAGNOSIS — IMO0001 Reserved for inherently not codable concepts without codable children: Secondary | ICD-10-CM

## 2015-09-24 ENCOUNTER — Other Ambulatory Visit (INDEPENDENT_AMBULATORY_CARE_PROVIDER_SITE_OTHER): Payer: PPO

## 2015-09-24 ENCOUNTER — Telehealth: Payer: Self-pay | Admitting: *Deleted

## 2015-09-24 DIAGNOSIS — E1165 Type 2 diabetes mellitus with hyperglycemia: Secondary | ICD-10-CM

## 2015-09-24 DIAGNOSIS — E785 Hyperlipidemia, unspecified: Secondary | ICD-10-CM

## 2015-09-24 DIAGNOSIS — IMO0001 Reserved for inherently not codable concepts without codable children: Secondary | ICD-10-CM

## 2015-09-24 LAB — LIPID PANEL
CHOL/HDL RATIO: 5
Cholesterol: 215 mg/dL — ABNORMAL HIGH (ref 0–200)
HDL: 40.8 mg/dL (ref >39.00)
NONHDL: 174.64
TRIGLYCERIDES: 232 mg/dL — AB (ref 0.0–149.0)
VLDL: 46.4 mg/dL — ABNORMAL HIGH (ref 0.0–40.0)

## 2015-09-24 LAB — BASIC METABOLIC PANEL
BUN: 13 mg/dL (ref 6–23)
CALCIUM: 9.6 mg/dL (ref 8.4–10.5)
CO2: 29 mEq/L (ref 19–32)
CREATININE: 0.83 mg/dL (ref 0.40–1.20)
Chloride: 102 mEq/L (ref 96–112)
GFR: 73.17 mL/min (ref >60.00)
Glucose, Bld: 200 mg/dL — ABNORMAL HIGH (ref 70–99)
Potassium: 4.2 mEq/L (ref 3.5–5.1)
Sodium: 139 mEq/L (ref 135–145)

## 2015-09-24 LAB — LDL CHOLESTEROL, DIRECT: Direct LDL: 135 mg/dL

## 2015-09-24 LAB — HEMOGLOBIN A1C: HEMOGLOBIN A1C: 8.5 % — AB (ref 4.6–6.5)

## 2015-09-24 NOTE — Telephone Encounter (Signed)
Placed in Dr. Synthia Innocent inbox. Will call pt when ready.

## 2015-09-24 NOTE — Telephone Encounter (Signed)
Elizabeth Curtis brought in a medical clearance form to be filled out. Please call her when it is completed. Form placed in prescription tower.

## 2015-09-24 NOTE — Telephone Encounter (Signed)
fiiled and in my out box.

## 2015-09-25 NOTE — Telephone Encounter (Signed)
Form faxed and patient notified.

## 2015-09-26 ENCOUNTER — Telehealth: Payer: Self-pay | Admitting: Family Medicine

## 2015-09-26 ENCOUNTER — Encounter: Payer: Self-pay | Admitting: Family Medicine

## 2015-09-26 ENCOUNTER — Ambulatory Visit (INDEPENDENT_AMBULATORY_CARE_PROVIDER_SITE_OTHER): Payer: PPO | Admitting: Family Medicine

## 2015-09-26 DIAGNOSIS — I10 Essential (primary) hypertension: Secondary | ICD-10-CM

## 2015-09-26 DIAGNOSIS — L237 Allergic contact dermatitis due to plants, except food: Secondary | ICD-10-CM | POA: Diagnosis not present

## 2015-09-26 DIAGNOSIS — E785 Hyperlipidemia, unspecified: Secondary | ICD-10-CM

## 2015-09-26 DIAGNOSIS — IMO0001 Reserved for inherently not codable concepts without codable children: Secondary | ICD-10-CM

## 2015-09-26 DIAGNOSIS — E1165 Type 2 diabetes mellitus with hyperglycemia: Secondary | ICD-10-CM

## 2015-09-26 MED ORDER — METFORMIN HCL ER (OSM) 1000 MG PO TB24: 1000.0000 mg | ORAL_TABLET | Freq: Two times a day (BID) | ORAL | 3 refills | Status: DC

## 2015-09-26 MED ORDER — TRIAMCINOLONE ACETONIDE 0.1 % EX CREA: 1.0000 "application " | TOPICAL_CREAM | Freq: Two times a day (BID) | CUTANEOUS | 0 refills | Status: AC

## 2015-09-26 MED ORDER — COLESEVELAM HCL 625 MG PO TABS: 625.0000 mg | ORAL_TABLET | Freq: Two times a day (BID) | ORAL | 3 refills | Status: DC

## 2015-09-26 MED ORDER — METFORMIN HCL ER (OSM) 1000 MG PO TB24: 1000.0000 mg | ORAL_TABLET | Freq: Every day | ORAL | 3 refills | Status: DC

## 2015-09-26 NOTE — Assessment & Plan Note (Signed)
Overall resolving, however has been using expired steroid cream. Will send in new TCI cream to use PRN. Pt agrees with plan.

## 2015-09-26 NOTE — Telephone Encounter (Signed)
New sig is 1000mg  BID, thanks.

## 2015-09-26 NOTE — Telephone Encounter (Signed)
Elizabeth Curtis with [facility] left v/m; received correct instructions but when ran thru ins metformin ER 1000 osmotic tab cost to pt is $1500.00; could new rx be sent for plain metformin ER 1000 mg not osmotic.Please advise.

## 2015-09-26 NOTE — Assessment & Plan Note (Signed)
Chronic. Intolerant to statins. Stopped fish oil. Discussed bile acid sequestrants - Rx printed for welchol for patient to start. Pt agrees with plan.

## 2015-09-26 NOTE — Progress Notes (Signed)
BP 114/72 (BP Location: Right Arm, Patient Position: Sitting, Cuff Size: Normal)   Pulse 79   Temp 98.6 F (37 C) (Oral)   Wt 171 lb 8 oz (77.8 kg)   SpO2 98%   BMI 25.33 kg/m    CC: 3.5 mo f/u visit Subjective:    Patient ID: Elizabeth Curtis, female    DOB: Apr 17, 1950, 65 y.o.   MRN: [id number]  HPI: [name] is a 65 y.o. female presenting on 09/26/2015 for Follow-up   Recently spent 6 wks with mother in [place] after fall with hip fracture and rehab.  Upcoming trip to [place]   Recent poison ivy exposure, spreading. Treating with steroid cream, benadryl and claritin.   DM - regularly has not been check sugars. Compliant with antihyperglycemic regimen which includes: fortamet ER 500mg /1000mg , glipizide 5mg  bid. Denies low sugars or hypoglycemic symptoms. Possible paresthesias. Last diabetic eye exam 06/2015.  Pneumovax: 2013.  Prevnar: 2017.  Lab Results  Component Value Date   HGBA1C 8.5 (H) 09/24/2015   Diabetic Foot Exam - Simple   Simple Foot Form Diabetic Foot exam was performed with the following findings:  Yes 09/26/2015 11:01 AM  Visual Inspection No deformities, no ulcerations, no other skin breakdown bilaterally:  Yes Sensation Testing Intact to touch and monofilament testing bilaterally:  Yes Pulse Check Posterior Tibialis and Dorsalis pulse intact bilaterally:  Yes Comments      HTN - Compliant with current antihypertensive regimen of amlodipine 5mg  daily and enalapril 20mg  bid. Does not regularly check blood pressures at home: good control on intermittent checks.   HLD - intolerant to statins. Off fish oil.   Relevant past medical, surgical, family and social history reviewed and updated as indicated. Interim medical history since our last visit reviewed. Allergies and medications reviewed and updated. Current Outpatient Prescriptions on File Prior to Visit  Medication Sig  . amLODipine (NORVASC) 5 MG tablet Take 1 tablet (5 mg total) by  mouth at bedtime.  . enalapril (VASOTEC) 20 MG tablet Take 1 tablet (20 mg total) by mouth 2 (two) times daily.  Marland Kitchen glipiZIDE (GLUCOTROL) 5 MG tablet Take 1 tablet (5 mg total) by mouth 2 (two) times daily before a meal.  . glucose blood (FREESTYLE LITE) test strip Use as instructed  . OVER THE COUNTER MEDICATION Take 1 capsule by mouth 2 (two) times daily. VISION SUPPLEMENT   No current facility-administered medications on file prior to visit.     Review of Systems Per HPI unless specifically indicated in ROS section     Objective:    BP 114/72 (BP Location: Right Arm, Patient Position: Sitting, Cuff Size: Normal)   Pulse 79   Temp 98.6 F (37 C) (Oral)   Wt 171 lb 8 oz (77.8 kg)   SpO2 98%   BMI 25.33 kg/m   Wt Readings from Last 3 Encounters:  09/26/15 171 lb 8 oz (77.8 kg)  06/05/15 174 lb 12 oz (79.3 kg)  03/07/15 177 lb 12 oz (80.6 kg)    Physical Exam  Constitutional: She appears well-developed and well-nourished. No distress.  HENT:  Head: Normocephalic and atraumatic.  Right Ear: External ear normal.  Left Ear: External ear normal.  Nose: Nose normal.  Mouth/Throat: Oropharynx is clear and moist. No oropharyngeal exudate.  Eyes: Conjunctivae and EOM are normal. Pupils are equal, round, and reactive to light. No scleral icterus.  Neck: Normal range of motion. Neck supple.  Cardiovascular: Normal rate, regular rhythm, normal heart sounds  and intact distal pulses.   No murmur heard. Pulmonary/Chest: Effort normal and breath sounds normal. No respiratory distress. She has no wheezes. She has no rales.  Musculoskeletal: She exhibits no edema.  See HPI for foot exam if done  Lymphadenopathy:    She has no cervical adenopathy.  Skin: Skin is warm and dry. Rash noted. There is erythema.  Erythematous pruritic erosions bilateral forearms and thighs  Psychiatric: She has a normal mood and affect.  Tearful with discussion of mother's recent condition  Nursing note and  vitals reviewed.  Results for orders placed or performed in visit on 09/24/15  Lipid panel  Result Value Ref Range   Cholesterol 215 (H) 0 - 200 mg/dL   Triglycerides 232.0 (H) 0.0 - 149.0 mg/dL   HDL 40.80 >39.00 mg/dL   VLDL 46.4 (H) 0.0 - 40.0 mg/dL   Total CHOL/HDL Ratio 5    NonHDL 174.64   Hemoglobin A1c  Result Value Ref Range   Hgb A1c MFr Bld 8.5 (H) 4.6 - 6.5 %  Basic metabolic panel  Result Value Ref Range   Sodium 139 135 - 145 mEq/L   Potassium 4.2 3.5 - 5.1 mEq/L   Chloride 102 96 - 112 mEq/L   CO2 29 19 - 32 mEq/L   Glucose, Bld 200 (H) 70 - 99 mg/dL   BUN 13 6 - 23 mg/dL   Creatinine, Ser 0.83 0.40 - 1.20 mg/dL   Calcium 9.6 8.4 - 10.5 mg/dL   GFR 73.17 >60.00 mL/min  LDL cholesterol, direct  Result Value Ref Range   Direct LDL 135.0 mg/dL      Assessment & Plan:   Problem List Items Addressed This Visit    Diabetes mellitus type 2, uncontrolled (HCC)    Chronic, deteriorated. Pt attributes to poor dietary choices recently.  Thinks she can work on [employer] changes. Will increase fortamet to 1000mg  bid, continue glipizide 5mg  bid.  Recheck a1c at 66mo f/u visit.       Relevant Medications   metformin (FORTAMET) 1000 MG (OSM) 24 hr tablet   Essential hypertension, benign    Chronic, stable. Continue current regimen.      Relevant Medications   colesevelam (WELCHOL) 625 MG tablet   HLD (hyperlipidemia)    Chronic. Intolerant to statins. Stopped fish oil. Discussed bile acid sequestrants - Rx printed for welchol for patient to start. Pt agrees with plan.      Relevant Medications   colesevelam (WELCHOL) 625 MG tablet   Poison ivy dermatitis    Overall resolving, however has been using expired steroid cream. Will send in new TCI cream to use PRN. Pt agrees with plan.       Other Visit Diagnoses   None.      Follow up plan: Return in about 3 months (around 12/27/2015) for follow up visit.  Ria Bush, MD

## 2015-09-26 NOTE — Telephone Encounter (Signed)
New metformin sent in.

## 2015-09-26 NOTE — Telephone Encounter (Signed)
Kayla for [facility] called regarding the patients Metformin RX.  She said the directions were unclear.  She is unsure if the directions should be take one pill 2x/day or if they should read 1 pill at breakfast and 2 at nighttime.  Please call her to clarify at [phone].  Thanks!

## 2015-09-26 NOTE — Patient Instructions (Addendum)
Increase fortamet to 1000mg  twice daily. Continue glipizide 5mg  twice daily.  I've printed out cholesterol medicine prescription - check into this and fill to help lower cholesterol levels. Goal LDL with diabetes <100.  Return in 3 months for follow up visit.

## 2015-09-26 NOTE — Addendum Note (Signed)
Addended by: Ria Bush on: 09/26/2015 05:31 PM   Modules accepted: Orders

## 2015-09-26 NOTE — Progress Notes (Signed)
Pre visit review using our clinic review tool, if applicable. No additional management support is needed unless otherwise documented below in the visit note. 

## 2015-09-26 NOTE — Assessment & Plan Note (Signed)
Chronic, stable. Continue current regimen. 

## 2015-09-26 NOTE — Assessment & Plan Note (Signed)
Chronic, deteriorated. Pt attributes to poor dietary choices recently.  Thinks she can work on [employer] changes. Will increase fortamet to 1000mg  bid, continue glipizide 5mg  bid.  Recheck a1c at 99mo f/u visit.

## 2015-09-26 NOTE — Telephone Encounter (Signed)
Kayla notified

## 2015-09-27 MED ORDER — METFORMIN HCL ER (OSM) 500 MG PO TB24: 1000.0000 mg | ORAL_TABLET | Freq: Two times a day (BID) | ORAL | 3 refills | Status: DC

## 2015-09-27 NOTE — Telephone Encounter (Signed)
Spoke with Elizabeth [facility] at [facility] and this has been taken care of.

## 2015-09-27 NOTE — Addendum Note (Signed)
Addended by: Ria Bush on: 09/27/2015 12:39 PM   Modules accepted: Orders

## 2015-09-27 NOTE — Telephone Encounter (Signed)
Spoke to Vicente Males called from [facility] this am, she can still get the [facility] delivery for pt, but needs changed to 2 tablets daily for 500 mg, which would be refill of 360 tablets, this is $4 copay for patient.     The original prescription of metformin (FORTAMET) 1000 MG (OSM) 24 hr tablet will cost the patient over $1500.    Please advise.  Best number to call [facility] is [phone]

## 2015-09-27 NOTE — Telephone Encounter (Signed)
Sent in OSM fortament 500mg  2 tab BID.  plz call to ensure this is taken care of.

## 2015-10-07 ENCOUNTER — Encounter: Payer: Self-pay | Admitting: Family Medicine

## 2015-10-08 MED ORDER — POLYMYXIN B-TRIMETHOPRIM 10000-0.1 UNIT/ML-% OP SOLN: 1.0000 [drp] | OPHTHALMIC | 0 refills | Status: DC

## 2015-10-22 ENCOUNTER — Encounter: Payer: Self-pay | Admitting: Family Medicine

## 2015-11-15 ENCOUNTER — Other Ambulatory Visit: Payer: Self-pay | Admitting: Family Medicine

## 2015-11-15 ENCOUNTER — Encounter: Payer: Self-pay | Admitting: Family Medicine

## 2015-12-05 ENCOUNTER — Other Ambulatory Visit: Payer: Self-pay | Admitting: Obstetrics & Gynecology

## 2015-12-05 DIAGNOSIS — Z1231 Encounter for screening mammogram for malignant neoplasm of breast: Secondary | ICD-10-CM

## 2015-12-12 DIAGNOSIS — D225 Melanocytic nevi of trunk: Secondary | ICD-10-CM | POA: Diagnosis not present

## 2015-12-12 DIAGNOSIS — L820 Inflamed seborrheic keratosis: Secondary | ICD-10-CM | POA: Diagnosis not present

## 2015-12-12 DIAGNOSIS — D2272 Melanocytic nevi of left lower limb, including hip: Secondary | ICD-10-CM | POA: Diagnosis not present

## 2015-12-12 DIAGNOSIS — L538 Other specified erythematous conditions: Secondary | ICD-10-CM | POA: Diagnosis not present

## 2015-12-12 DIAGNOSIS — D2261 Melanocytic nevi of right upper limb, including shoulder: Secondary | ICD-10-CM | POA: Diagnosis not present

## 2015-12-12 DIAGNOSIS — Z85820 Personal history of malignant melanoma of skin: Secondary | ICD-10-CM | POA: Diagnosis not present

## 2015-12-15 ENCOUNTER — Encounter: Payer: Self-pay | Admitting: Family Medicine

## 2015-12-15 DIAGNOSIS — IMO0001 Reserved for inherently not codable concepts without codable children: Secondary | ICD-10-CM

## 2015-12-15 DIAGNOSIS — E785 Hyperlipidemia, unspecified: Secondary | ICD-10-CM

## 2015-12-15 DIAGNOSIS — E1165 Type 2 diabetes mellitus with hyperglycemia: Principal | ICD-10-CM

## 2015-12-16 NOTE — Telephone Encounter (Signed)
I asked patient to come in for labs prior to next visit.

## 2015-12-23 ENCOUNTER — Other Ambulatory Visit (INDEPENDENT_AMBULATORY_CARE_PROVIDER_SITE_OTHER): Payer: PPO

## 2015-12-23 DIAGNOSIS — E1165 Type 2 diabetes mellitus with hyperglycemia: Secondary | ICD-10-CM

## 2015-12-23 DIAGNOSIS — E785 Hyperlipidemia, unspecified: Secondary | ICD-10-CM

## 2015-12-23 DIAGNOSIS — IMO0001 Reserved for inherently not codable concepts without codable children: Secondary | ICD-10-CM

## 2015-12-23 LAB — LDL CHOLESTEROL, DIRECT: LDL DIRECT: 122 mg/dL

## 2015-12-23 LAB — LIPID PANEL
Cholesterol: 188 mg/dL (ref 0–200)
HDL: 37.5 mg/dL — ABNORMAL LOW (ref >39.00)
NONHDL: 150.72
Total CHOL/HDL Ratio: 5
Triglycerides: 212 mg/dL — ABNORMAL HIGH (ref 0.0–149.0)
VLDL: 42.4 mg/dL — ABNORMAL HIGH (ref 0.0–40.0)

## 2015-12-23 LAB — HEMOGLOBIN A1C: HEMOGLOBIN A1C: 7.9 % — AB (ref 4.6–6.5)

## 2015-12-27 ENCOUNTER — Encounter: Payer: Self-pay | Admitting: Family Medicine

## 2015-12-27 ENCOUNTER — Ambulatory Visit (INDEPENDENT_AMBULATORY_CARE_PROVIDER_SITE_OTHER): Payer: PPO | Admitting: Family Medicine

## 2015-12-27 VITALS — BP 128/78 | HR 68 | Temp 98.3°F | Wt 169.8 lb

## 2015-12-27 DIAGNOSIS — IMO0001 Reserved for inherently not codable concepts without codable children: Secondary | ICD-10-CM

## 2015-12-27 DIAGNOSIS — R591 Generalized enlarged lymph nodes: Secondary | ICD-10-CM

## 2015-12-27 DIAGNOSIS — E1165 Type 2 diabetes mellitus with hyperglycemia: Secondary | ICD-10-CM

## 2015-12-27 DIAGNOSIS — E785 Hyperlipidemia, unspecified: Secondary | ICD-10-CM

## 2015-12-27 DIAGNOSIS — J3489 Other specified disorders of nose and nasal sinuses: Secondary | ICD-10-CM | POA: Diagnosis not present

## 2015-12-27 MED ORDER — MUPIROCIN CALCIUM 2 % EX CREA: 1.0000 "application " | TOPICAL_CREAM | Freq: Two times a day (BID) | CUTANEOUS | 1 refills | Status: DC

## 2015-12-27 NOTE — Assessment & Plan Note (Signed)
R parotid LAD palpated today - new. Pt will monitor for resolution over next 3-4 weeks, and notify if persistent LAD for further evaluation (labs and ENT referral)

## 2015-12-27 NOTE — Progress Notes (Signed)
Pre visit review using our clinic review tool, if applicable. No additional management support is needed unless otherwise documented below in the visit note. 

## 2015-12-27 NOTE — Assessment & Plan Note (Signed)
With exposure to MRSA - will treat with mupirocin.  Update if not improved with treatment. Pt agrees with plan.

## 2015-12-27 NOTE — Patient Instructions (Addendum)
Try welchol. Increase glipizide to 10mg  in am and 5mg  in pm Continue metformin 500/1000mg  daily.  Continue walking - congratulations on increased activity! For nose - use mupirocin antibiotic ointment twice daily for the next week to see if full resolution.  Keep an eye on right swollen gland at angle of the jaw. If persistent past 3-4 weeks, let me know for further investigation (labs and possibly ENT referral) Return in 3 months for wellness visit

## 2015-12-27 NOTE — Assessment & Plan Note (Addendum)
Reviewed A1c. Did not tolerate higher metformin dose, so will return to 500/1000mg  daily. Agrees to increase glipizide to 10/5mg  daily. Continue regular walking routine. Reassess at f/u visit.

## 2015-12-27 NOTE — Assessment & Plan Note (Signed)
Chronic, intolerant to statins. Did not start welchol. Improvement noted in FLP however not at goal. rec try welchol. Will reassess at CPE.

## 2015-12-27 NOTE — Progress Notes (Signed)
BP 128/78   Pulse 68   Temp 98.3 F (36.8 C) (Oral)   Wt 169 lb 12 oz (77 kg)   BMI 25.07 kg/m    CC: 3 mo f/u visit Subjective:    Patient ID: [name], female    DOB: 06/01/1950, 65 y.o.   MRN: [id number]  HPI: LEXY DANNELLY is a 65 y.o. female presenting on 12/27/2015 for Follow-up   Noticing sores in bilateral nostrils, treating with OTC antibiotic with mild improvement. Brother's family with MRSA.   DM - regularly has not been check sugars. Compliant with antihyperglycemic regimen which includes: fortamet ER 500mg /1000mg  (did not tolerate 4 at a time), glipizide 5mg  bid. Denies low sugars or hypoglycemic symptoms. Possible paresthesias. Last diabetic eye exam 06/2015.  Pneumovax: 2013.  Prevnar: 2017.  Lab Results  Component Value Date   HGBA1C 7.9 (H) 12/23/2015    HLD - intolerant to statins. Off fish oil. Last visit we prescribed welchol - she did not fill this.   Relevant past medical, surgical, family and social history reviewed and updated as indicated. Interim medical history since our last visit reviewed. Allergies and medications reviewed and updated. Current Outpatient Prescriptions on File Prior to Visit  Medication Sig  . amLODipine (NORVASC) 5 MG tablet Take 1 tablet (5 mg total) by mouth at bedtime.  . enalapril (VASOTEC) 20 MG tablet Take 1 tablet (20 mg total) by mouth 2 (two) times daily.  Marland Kitchen glucose blood (FREESTYLE LITE) test strip Use as instructed  . triamcinolone cream (KENALOG) 0.1 % Apply 1 application topically 2 (two) times daily. Apply to AA.  . trimethoprim-polymyxin b (POLYTRIM) ophthalmic solution Place 1 drop into both eyes every 4 (four) hours.  . colesevelam (WELCHOL) 625 MG tablet Take 1 tablet (625 mg total) by mouth 2 (two) times daily with a meal. (Patient not taking: Reported on 12/27/2015)  . OVER THE COUNTER MEDICATION Take 1 capsule by mouth 2 (two) times daily. VISION SUPPLEMENT   No current facility-administered  medications on file prior to visit.     Review of Systems Per HPI unless specifically indicated in ROS section     Objective:    BP 128/78   Pulse 68   Temp 98.3 F (36.8 C) (Oral)   Wt 169 lb 12 oz (77 kg)   BMI 25.07 kg/m   Wt Readings from Last 3 Encounters:  12/27/15 169 lb 12 oz (77 kg)  09/26/15 171 lb 8 oz (77.8 kg)  06/05/15 174 lb 12 oz (79.3 kg)    Physical Exam  Constitutional: She appears well-developed and well-nourished. No distress.  HENT:  Right Ear: Hearing, tympanic membrane, external ear and ear canal normal.  Nose: Nose normal. No mucosal edema or rhinorrhea. Right sinus exhibits no maxillary sinus tenderness and no frontal sinus tenderness. Left sinus exhibits no maxillary sinus tenderness and no frontal sinus tenderness.  Mouth/Throat: Oropharynx is clear and moist. No oropharyngeal exudate.  No lesions identified L nare Small sore R lateral inner nare, nontender Palpable R lymph node at angle of mandible without parotid swelling  Eyes: Conjunctivae are normal. Pupils are equal, round, and reactive to light.  Cardiovascular: Normal rate, regular rhythm, normal heart sounds and intact distal pulses.   No murmur heard. Pulmonary/Chest: Effort normal and breath sounds normal. No respiratory distress. She has no wheezes. She has no rales.  Musculoskeletal: She exhibits no edema.  Lymphadenopathy:    She has no cervical adenopathy.  Skin: Skin is  warm and dry. No rash noted.  Psychiatric: She has a normal mood and affect.  Nursing note and vitals reviewed.  Results for orders placed or performed in visit on 12/23/15  Lipid panel  Result Value Ref Range   Cholesterol 188 0 - 200 mg/dL   Triglycerides 212.0 (H) 0.0 - 149.0 mg/dL   HDL 37.50 (L) >39.00 mg/dL   VLDL 42.4 (H) 0.0 - 40.0 mg/dL   Total CHOL/HDL Ratio 5    NonHDL 150.72   Hemoglobin A1c  Result Value Ref Range   Hgb A1c MFr Bld 7.9 (H) 4.6 - 6.5 %  LDL cholesterol, direct  Result Value  Ref Range   Direct LDL 122.0 mg/dL      Assessment & Plan:   Problem List Items Addressed This Visit    Diabetes mellitus type 2, uncontrolled ([facility]) - Primary    Reviewed A1c. Did not tolerate higher metformin dose, so will return to 500/1000mg  daily. Agrees to increase glipizide to 10/5mg  daily. Continue regular walking routine. Reassess at f/u visit.       Relevant Medications   metformin (FORTAMET) 500 MG (OSM) 24 hr tablet   glipiZIDE (GLUCOTROL) 5 MG tablet   HLD (hyperlipidemia)    Chronic, intolerant to statins. Did not start welchol. Improvement noted in FLP however not at goal. rec try welchol. Will reassess at CPE.       Lymphadenopathy of head and neck    R parotid LAD palpated today - new. Pt will monitor for resolution over next 3-4 weeks, and notify if persistent LAD for further evaluation (labs and ENT referral)      Nasal sore    With exposure to MRSA - will treat with mupirocin.  Update if not improved with treatment. Pt agrees with plan.          Follow up plan: Return in about 3 months (around 03/28/2016) for medicare wellness visit.  Ria Bush, MD

## 2015-12-31 ENCOUNTER — Encounter: Payer: Self-pay | Admitting: Family Medicine

## 2016-01-06 DIAGNOSIS — H26492 Other secondary cataract, left eye: Secondary | ICD-10-CM | POA: Diagnosis not present

## 2016-01-06 DIAGNOSIS — Z961 Presence of intraocular lens: Secondary | ICD-10-CM | POA: Diagnosis not present

## 2016-01-06 DIAGNOSIS — Z947 Corneal transplant status: Secondary | ICD-10-CM | POA: Diagnosis not present

## 2016-01-28 ENCOUNTER — Ambulatory Visit: Payer: PPO

## 2016-02-07 DIAGNOSIS — Z8619 Personal history of other infectious and parasitic diseases: Secondary | ICD-10-CM | POA: Diagnosis not present

## 2016-02-07 DIAGNOSIS — Z1211 Encounter for screening for malignant neoplasm of colon: Secondary | ICD-10-CM | POA: Diagnosis not present

## 2016-02-07 DIAGNOSIS — Z87898 Personal history of other specified conditions: Secondary | ICD-10-CM | POA: Diagnosis not present

## 2016-02-07 DIAGNOSIS — Z01419 Encounter for gynecological examination (general) (routine) without abnormal findings: Secondary | ICD-10-CM | POA: Diagnosis not present

## 2016-02-07 DIAGNOSIS — Z1231 Encounter for screening mammogram for malignant neoplasm of breast: Secondary | ICD-10-CM | POA: Diagnosis not present

## 2016-02-14 ENCOUNTER — Ambulatory Visit
Admission: RE | Admit: 2016-02-14 | Discharge: 2016-02-14 | Disposition: A | Discharge status: Home / Self Care | Payer: PPO | Source: Ambulatory Visit | Attending: Obstetrics & Gynecology | Admitting: Obstetrics & Gynecology

## 2016-02-14 DIAGNOSIS — Z1231 Encounter for screening mammogram for malignant neoplasm of breast: Secondary | ICD-10-CM | POA: Diagnosis not present

## 2016-02-14 HISTORY — DX: Malignant melanoma of skin, unspecified: C43.9

## 2016-02-14 IMAGING — MG MM DIGITAL SCREENING BILAT W/ TOMO W/ CAD
9 of 15 series · 9 of 31 positions shown · non-contrast
Comparison: Previous exam(s).

CLINICAL DATA: Screening.

EXAM:
2D DIGITAL SCREENING BILATERAL MAMMOGRAM WITH CAD AND ADJUNCT TOMO

[R MLO (1 of 2)]
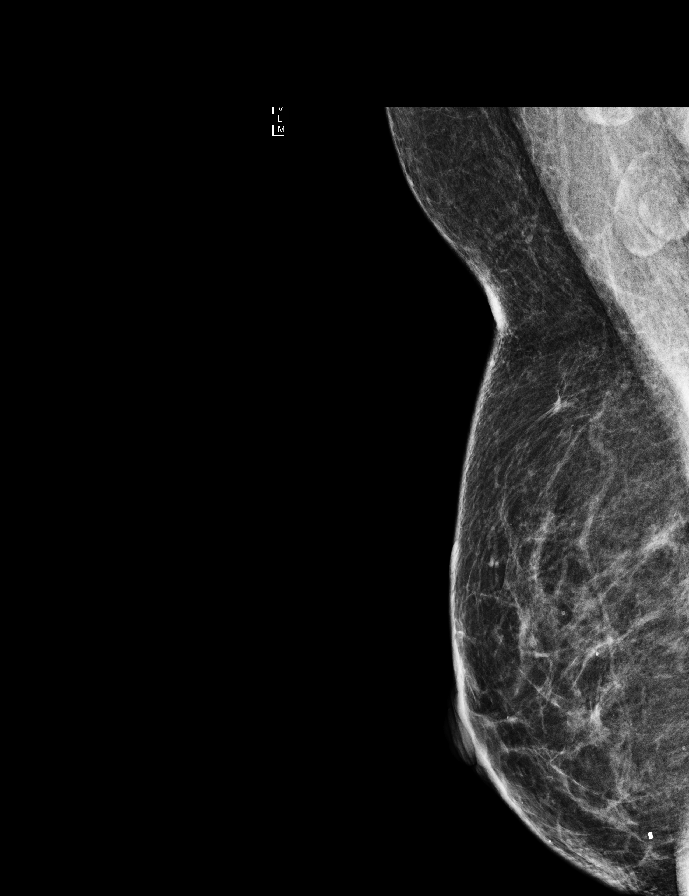

[R XCCL]
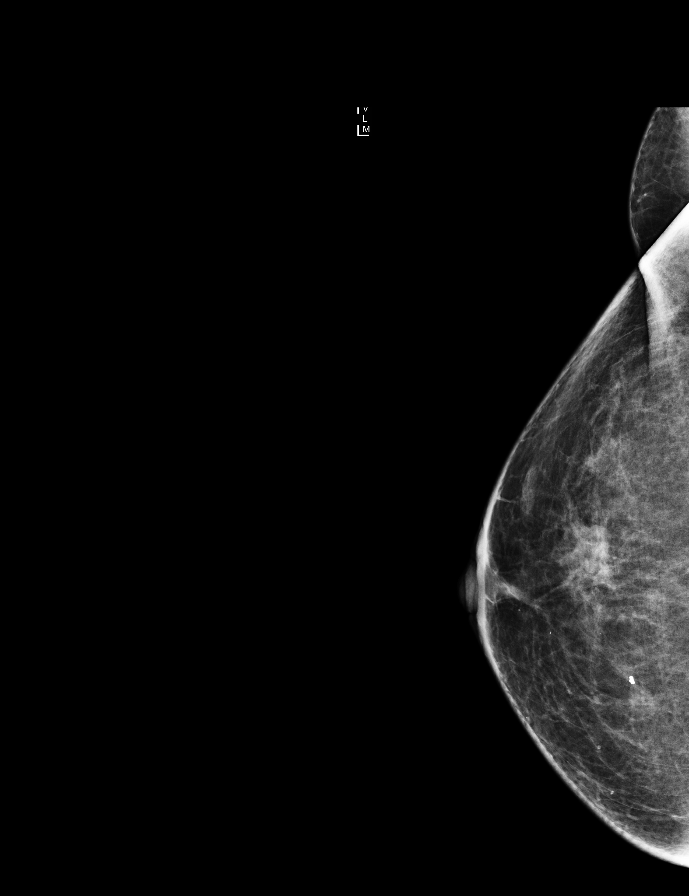

[L CC]
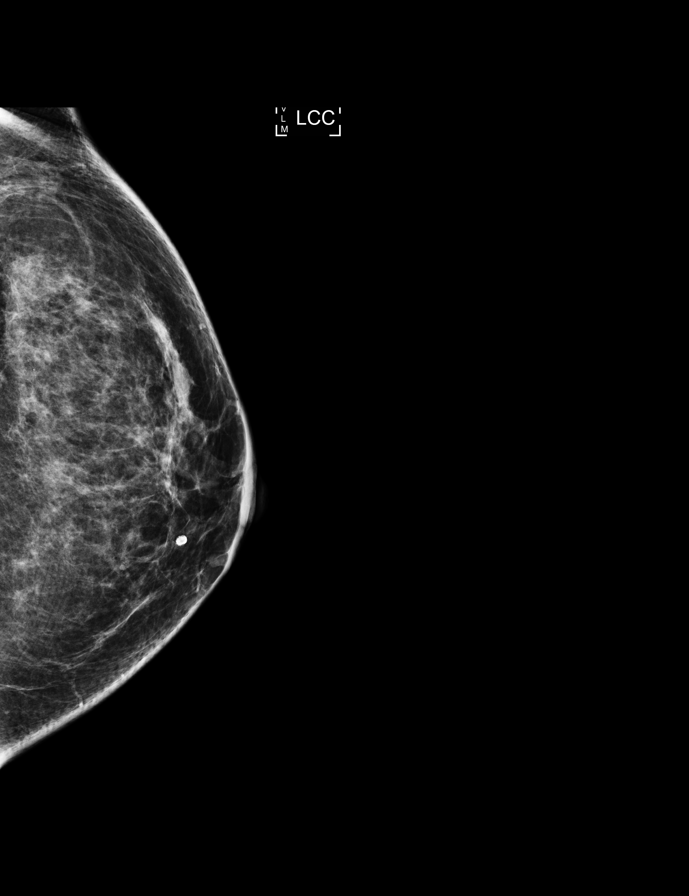

[L MLO]
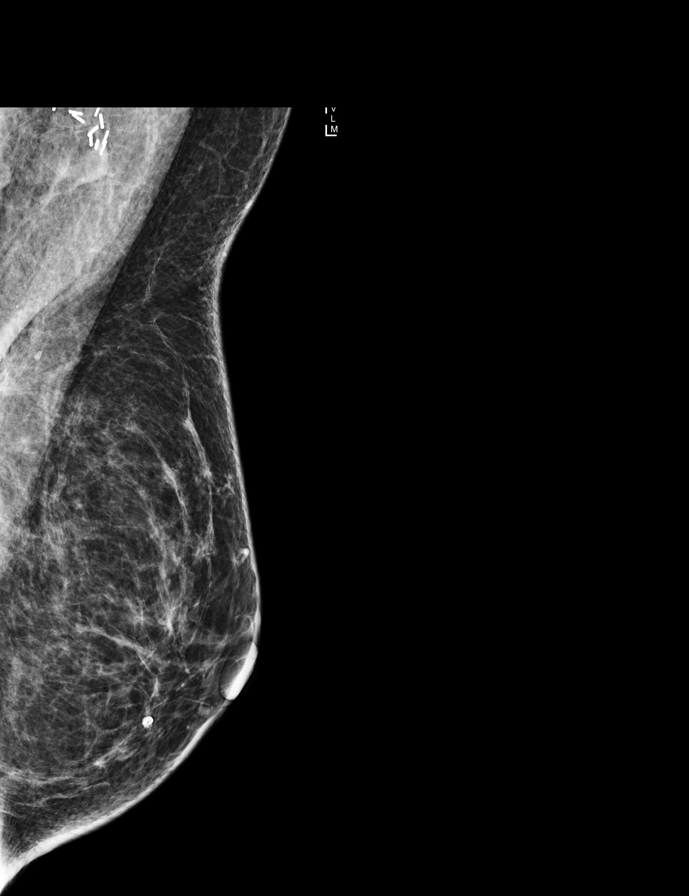

[L CC synth-2D]
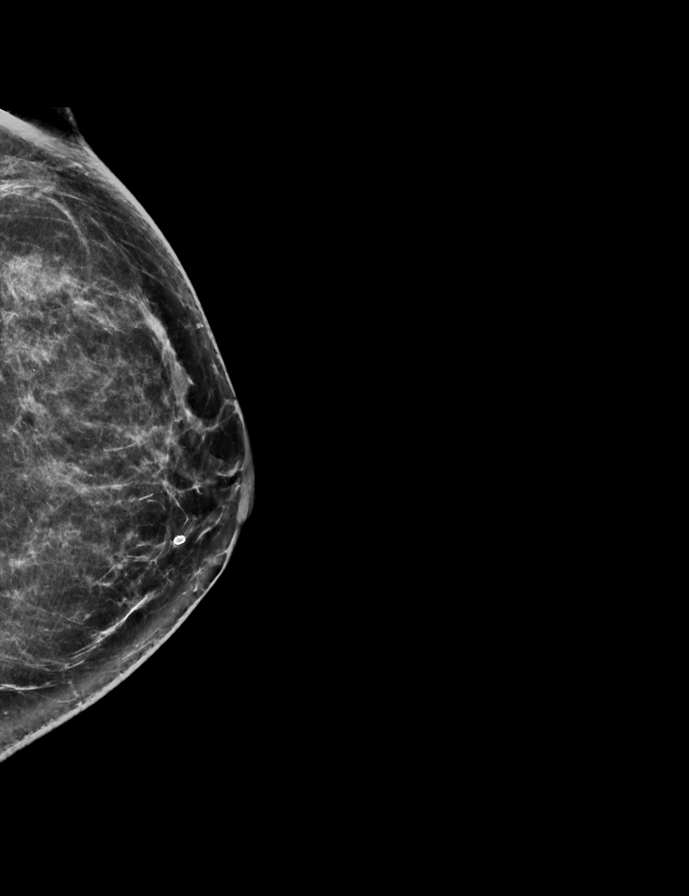

[R MLO synth-2D]
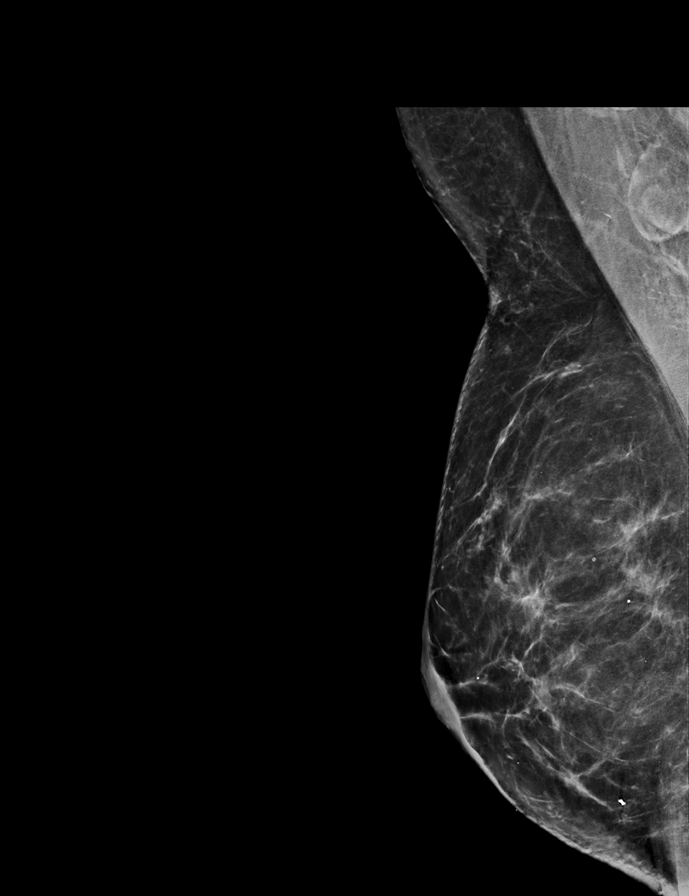

[R CC]
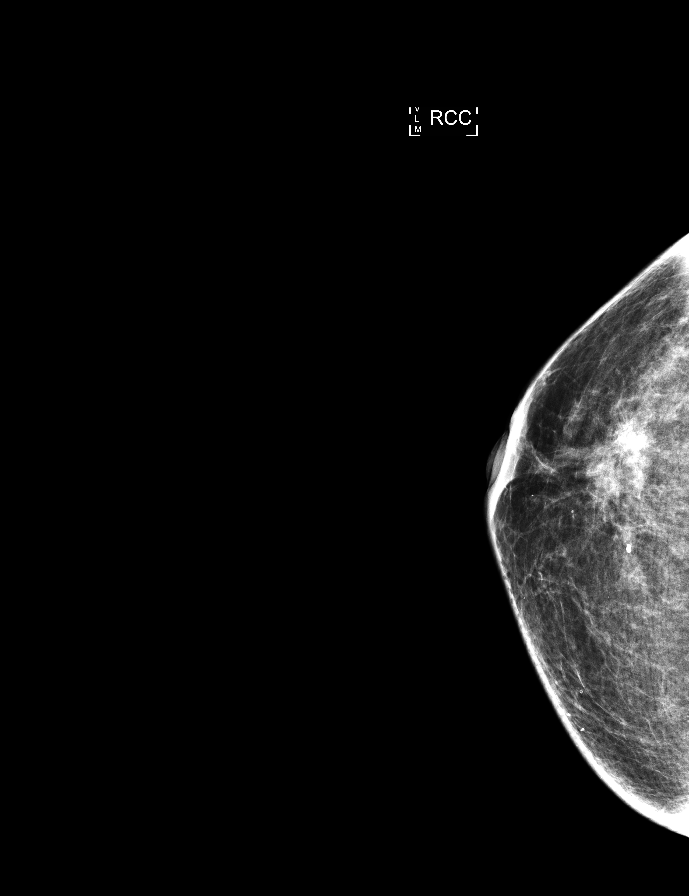

[R MLO (2 of 2)]
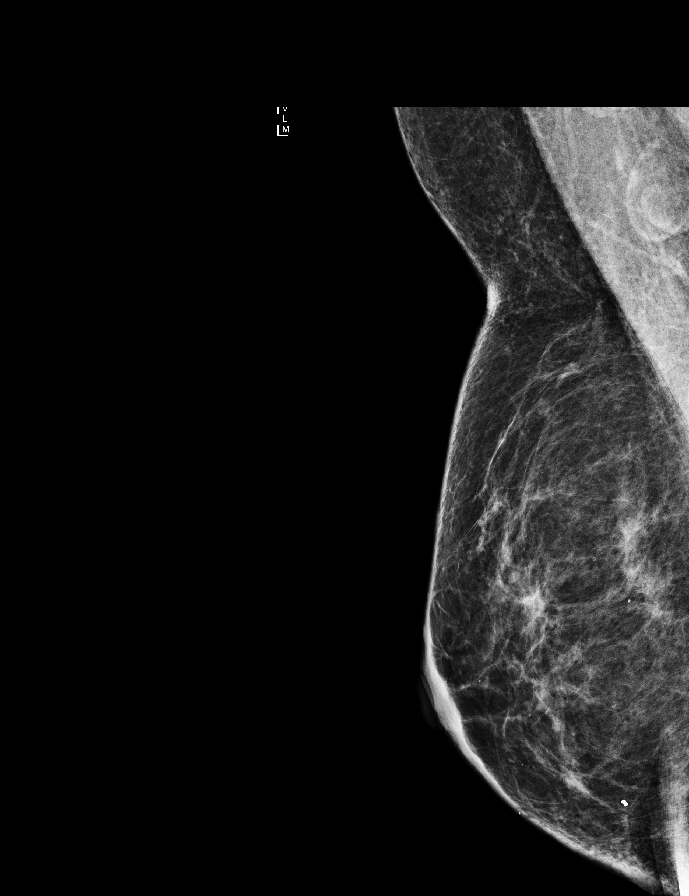

[L MLO synth-2D]
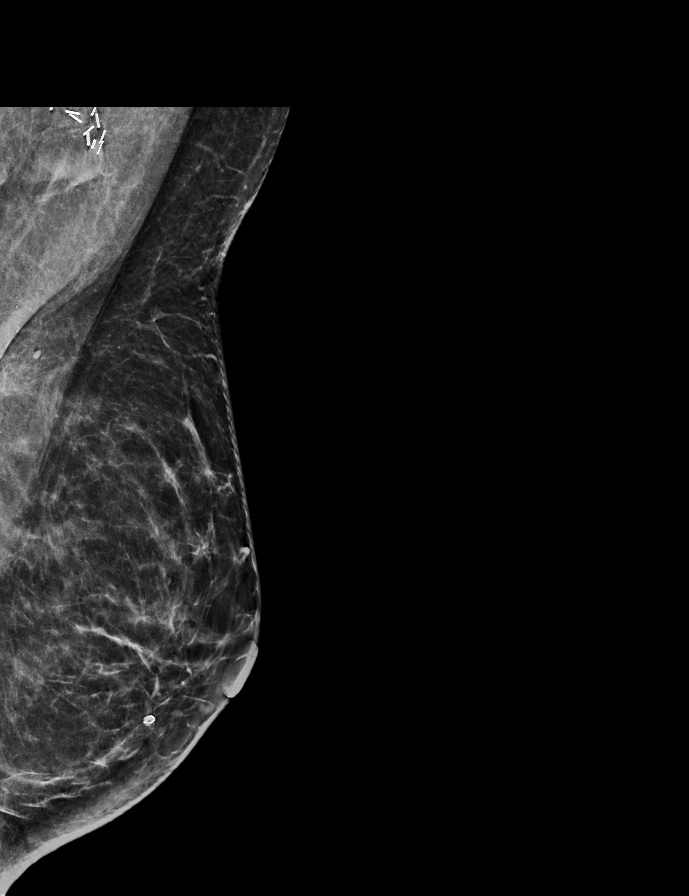

[9 of 31 positions shown; findings below may reference images not displayed]

ACR Breast Density Category b: There are scattered areas of
fibroglandular density.
FINDINGS: There are no findings suspicious for malignancy. Images were
processed with CAD.
IMPRESSION: No mammographic evidence of malignancy. A result letter of this
screening mammogram will be mailed directly to the patient.

RECOMMENDATION:
Screening mammogram in one year. (Code:[33])

BI-RADS CATEGORY  1: Negative.

## 2016-03-25 ENCOUNTER — Ambulatory Visit: Payer: PPO

## 2016-03-31 ENCOUNTER — Encounter: Payer: PPO | Admitting: Family Medicine

## 2016-04-13 ENCOUNTER — Telehealth: Payer: Self-pay | Admitting: Family Medicine

## 2016-04-13 ENCOUNTER — Encounter: Payer: Self-pay | Admitting: Family Medicine

## 2016-04-13 DIAGNOSIS — R591 Generalized enlarged lymph nodes: Secondary | ICD-10-CM

## 2016-04-13 NOTE — Telephone Encounter (Signed)
Please let pt know I did the ref to ENT in Dr Synthia Innocent absence  Will forward to [facility] and cc to PCP thanks

## 2016-04-13 NOTE — Telephone Encounter (Signed)
Dr. Glori Bickers,  Could you please read the note on this and make the referral for her in Dr. Synthia Innocent absence, please?  Thanks!

## 2016-04-13 NOTE — Telephone Encounter (Signed)
Pt notified referral done and our P[facility] will call to schedule appt 

## 2016-04-14 NOTE — Telephone Encounter (Signed)
Appt made and patient aware.  

## 2016-04-16 ENCOUNTER — Other Ambulatory Visit: Payer: Self-pay | Admitting: Family Medicine

## 2016-04-18 ENCOUNTER — Encounter: Payer: Self-pay | Admitting: Family Medicine

## 2016-04-21 MED ORDER — METFORMIN HCL ER 500 MG PO TB24: ORAL_TABLET | ORAL | 3 refills | Status: DC

## 2016-04-29 ENCOUNTER — Other Ambulatory Visit: Payer: Self-pay | Admitting: Family Medicine

## 2016-04-29 ENCOUNTER — Ambulatory Visit (INDEPENDENT_AMBULATORY_CARE_PROVIDER_SITE_OTHER): Payer: PPO

## 2016-04-29 VITALS — BP 132/88 | HR 78 | Temp 98.5°F | Ht 69.0 in | Wt 173.2 lb

## 2016-04-29 DIAGNOSIS — Z0000 Encounter for general adult medical examination without abnormal findings: Secondary | ICD-10-CM | POA: Diagnosis not present

## 2016-04-29 DIAGNOSIS — E785 Hyperlipidemia, unspecified: Secondary | ICD-10-CM

## 2016-04-29 DIAGNOSIS — R946 Abnormal results of thyroid function studies: Secondary | ICD-10-CM

## 2016-04-29 DIAGNOSIS — IMO0001 Reserved for inherently not codable concepts without codable children: Secondary | ICD-10-CM

## 2016-04-29 DIAGNOSIS — E1165 Type 2 diabetes mellitus with hyperglycemia: Secondary | ICD-10-CM

## 2016-04-29 LAB — COMPREHENSIVE METABOLIC PANEL
ALT: 16 U/L (ref 0–35)
AST: 12 U/L (ref 0–37)
Albumin: 4.5 g/dL (ref 3.5–5.2)
Alkaline Phosphatase: 53 U/L (ref 39–117)
BILIRUBIN TOTAL: 0.5 mg/dL (ref 0.2–1.2)
BUN: 14 mg/dL (ref 6–23)
CALCIUM: 9.5 mg/dL (ref 8.4–10.5)
CO2: 29 meq/L (ref 19–32)
CREATININE: 0.69 mg/dL (ref 0.40–1.20)
Chloride: 103 mEq/L (ref 96–112)
GFR: 90.39 mL/min (ref >60.00)
GLUCOSE: 208 mg/dL — AB (ref 70–99)
Potassium: 4.2 mEq/L (ref 3.5–5.1)
Sodium: 139 mEq/L (ref 135–145)
Total Protein: 6.9 g/dL (ref 6.0–8.3)

## 2016-04-29 LAB — LIPID PANEL
CHOL/HDL RATIO: 5
Cholesterol: 204 mg/dL — ABNORMAL HIGH (ref 0–200)
HDL: 39.7 mg/dL (ref >39.00)
LDL Cholesterol: 126 mg/dL — ABNORMAL HIGH (ref 0–99)
NONHDL: 164.43
TRIGLYCERIDES: 192 mg/dL — AB (ref 0.0–149.0)
VLDL: 38.4 mg/dL (ref 0.0–40.0)

## 2016-04-29 LAB — TSH: TSH: 0.72 u[IU]/mL (ref 0.35–4.50)

## 2016-04-29 LAB — HEMOGLOBIN A1C: HEMOGLOBIN A1C: 8.1 % — AB (ref 4.6–6.5)

## 2016-04-29 NOTE — Patient Instructions (Signed)
Elizabeth Curtis , Thank you for taking time to come for your Medicare Wellness Visit. I appreciate your ongoing commitment to your health goals. Please review the following plan we discussed and let me know if I can assist you in the future.   These are the goals we discussed: Goals    . Increase physical activity          When schedule permits, I will walk at least 60 min 5-6 days per week.        This is a list of the screening recommended for you and due dates:  Health Maintenance  Topic Date Due  . DTaP/Tdap/Td vaccine (1 - Tdap) 04/30/2018*  . Eye exam for diabetics  06/12/2016  . Hemoglobin A1C  06/21/2016  . Pneumonia vaccines (2 of 2 - PPSV23) 09/21/2016  . Complete foot exam   09/25/2016  . Mammogram  02/13/2018  . Tetanus Vaccine  05/10/2018  . Colon Cancer Screening  07/24/2018  . Flu Shot  Completed  . DEXA scan (bone density measurement)  Completed  .  Hepatitis C: One time screening is recommended by Center for Disease Control  (CDC) for  adults born from [birth year] through 1966.   Completed  *Topic was postponed. The date shown is not the original due date.   Preventive Care for Adults  A healthy lifestyle and preventive care can promote health and wellness. Preventive health guidelines for adults include the following key practices.  . A routine yearly physical is a good way to check with your health care provider about your health and preventive screening. It is a chance to share any concerns and updates on your health and to receive a thorough exam.  . Visit your dentist for a routine exam and preventive care every 6 months. Brush your teeth twice a day and floss once a day. Good oral hygiene prevents tooth decay and gum disease.  . The frequency of eye exams is based on your age, health, family medical history, use  of contact lenses, and other factors. Follow your health care provider's ecommendations for frequency of eye exams.  . Eat a healthy diet. Foods like  vegetables, fruits, whole grains, low-fat dairy products, and lean protein foods contain the nutrients you need without too many calories. Decrease your intake of foods high in solid fats, added sugars, and salt. Eat the right amount of calories for you. Get information about a proper diet from your health care provider, if necessary.  . Regular physical exercise is one of the most important things you can do for your health. Most adults should get at least 150 minutes of moderate-intensity exercise (any activity that increases your heart rate and causes you to sweat) each week. In addition, most adults need muscle-strengthening exercises on 2 or more days a week.  Silver Sneakers may be a benefit available to you. To determine eligibility, you may visit the website: www.silversneakers.com or contact program at 8581977449 Mon-Fri between 8AM-8PM.   . Maintain a healthy weight. The body mass index (BMI) is a screening tool to identify possible weight problems. It provides an estimate of body fat based on height and weight. Your health care provider can find your BMI and can help you achieve or maintain a healthy weight.   For adults 20 years and older: ? A BMI below 18.5 is considered underweight. ? A BMI of 18.5 to 24.9 is normal. ? A BMI of 25 to 29.9 is considered overweight. ? A BMI of  30 and above is considered obese.   . Maintain normal blood lipids and cholesterol levels by exercising and minimizing your intake of saturated fat. Eat a balanced diet with plenty of fruit and vegetables. Blood tests for lipids and cholesterol should begin at age 25 and be repeated every 5 years. If your lipid or cholesterol levels are high, you are over 50, or you are at high risk for heart disease, you may need your cholesterol levels checked more frequently. Ongoing high lipid and cholesterol levels should be treated with medicines if diet and exercise are not working.  . If you smoke, find out from your  health care provider how to quit. If you do not use tobacco, please do not start.  . If you choose to drink alcohol, please do not consume more than 2 drinks per day. One drink is considered to be 12 ounces (355 mL) of beer, 5 ounces (148 mL) of wine, or 1.5 ounces (44 mL) of liquor.  . If you are 77-76 years old, ask your health care provider if you should take aspirin to prevent strokes.  . Use sunscreen. Apply sunscreen liberally and repeatedly throughout the day. You should seek shade when your shadow is shorter than you. Protect yourself by wearing long sleeves, pants, a wide-brimmed hat, and sunglasses year round, whenever you are outdoors.  . Once a month, do a whole body skin exam, using a mirror to look at the skin on your back. Tell your health care provider of new moles, moles that have irregular borders, moles that are larger than a pencil eraser, or moles that have changed in shape or color.

## 2016-04-29 NOTE — Progress Notes (Signed)
Pre visit review using our clinic review tool, if applicable. No additional management support is needed unless otherwise documented below in the visit note. 

## 2016-04-29 NOTE — Progress Notes (Signed)
Subjective:   Elizabeth Curtis is a 66 y.o. female who presents for Medicare Annual (Subsequent) preventive examination.  Review of Systems:  N/A Cardiac Risk Factors include: advanced age (>27men, >15 women);dyslipidemia;hypertension;diabetes mellitus     Objective:     Vitals: BP 132/88 (BP Location: Right Arm, Patient Position: Sitting, Cuff Size: Normal) Comment: no BP medication taken  Pulse 78   Temp 98.5 F (36.9 C) (Oral)   Ht 5\' 9"  (1.753 m)   Wt 173 lb 4 oz (78.6 kg)   SpO2 94%   BMI 25.58 kg/m   Body mass index is 25.58 kg/m.   Tobacco History  Smoking Status  . Never Smoker  Smokeless Tobacco  . Never Used     Counseling given: No   Past Medical History:  Diagnosis Date  . Chronic low back pain   . Diabetes mellitus   . Diffuse cystic mastopathy   . Disturbance of skin sensation   . Diverticulosis of colon (without mention of hemorrhage)   . Dysphagia, unspecified(787.20)   . Essential hypertension, benign 1995  . H/O cystitis 2011  . History of colonic polyps    Hyperplastic  . History of pelvic ultrasound 03/05   Uterus 13 X 7 X 8cm, Fibroid 2.6cm  . HLD (hyperlipidemia)   . Lipoma   . Mammographic microcalcification 2013  . Melanoma ([place])    upper abdomin left SN removed under axilla  . Osteopenia 03/2015   DEXA hip -1.7, spine -1.7  . Plantar wart   . Schatzki's ring 07/23/08   EGD dilated O/W normal (Dr. Sharlett Iles)  . Shoulder pain, left   . Vision abnormalities    Past Surgical History:  Procedure Laterality Date  . BREAST BIOPSY Right 07/2011   rec rpt 6 mo, neg  . [place]   [place] presentation  . COLONOSCOPY  2010   [place], rec rpt 10 yrs  . ESOPHAGOGASTRODUODENOSCOPY  2010   [place], dilated schatzki ring  . EYE SURGERY Left 2012   cataract  . KNEE ARTHROSCOPY W/ MENISCAL REPAIR Left 01/2014   [facility]  . MELANOMA EXCISION  2012   upper abdomen   Family History  Problem Relation Age of Onset  .  Diabetes Mother   . Hyperlipidemia Mother   . Hypertension Mother   . Mental illness Mother     Personality disorder  . Kidney disease Father     failure, (Diaylsis)  . Diabetes Father   . Diabetes Brother   . Coronary artery disease Brother 51    CAD, hospitalized freq, PTCA  . Gout Brother   . Hypertension Brother   . Cancer Maternal Aunt 62    breast  . Breast cancer Maternal Aunt 70  . Cancer Paternal Aunt 50    breast  . Breast cancer Paternal Aunt 17  . ALS Paternal Aunt   . Stroke Paternal Grandfather   . Stroke Maternal Grandmother   . Breast cancer Maternal Aunt 70   History  Sexual Activity  . Sexual activity: Yes    Outpatient Encounter Prescriptions as of 04/29/2016  Medication Sig  . amLODipine (NORVASC) 5 MG tablet TAKE 1 TABLET (5 MG TOTAL) BY MOUTH AT BEDTIME.  . enalapril (VASOTEC) 20 MG tablet TAKE 1 TABLET (20 MG TOTAL) BY MOUTH 2 (TWO) TIMES DAILY.  Marland Kitchen glipiZIDE (GLUCOTROL) 5 MG tablet Take 1 tablet (5 mg total) by mouth as directed. Take 10mg  in am and 5mg  in pm  . glucose  blood (FREESTYLE LITE) test strip Use as instructed  . metFORMIN (GLUCOPHAGE XR) 500 MG 24 hr tablet Take one tablet in the AM and two in the PM with meals  . mupirocin cream (BACTROBAN) 2 % Apply 1 application topically 2 (two) times daily. To nares  . OVER THE COUNTER MEDICATION Take 1 capsule by mouth 2 (two) times daily. VISION SUPPLEMENT  . triamcinolone cream (KENALOG) 0.1 % Apply 1 application topically 2 (two) times daily. Apply to AA.  . colesevelam (WELCHOL) 625 MG tablet Take 1 tablet (625 mg total) by mouth 2 (two) times daily with a meal. (Patient not taking: Reported on 12/27/2015)  . trimethoprim-polymyxin b (POLYTRIM) ophthalmic solution Place 1 drop into both eyes every 4 (four) hours. (Patient not taking: Reported on 04/29/2016)  . [DISCONTINUED] glipiZIDE (GLUCOTROL) 5 MG tablet TAKE 1 TABLET (5 MG TOTAL) BY MOUTH 2 (TWO) TIMES DAILY BEFORE A MEAL.   No  facility-administered encounter medications on file as of 04/29/2016.     Activities of Daily Living In your present state of health, do you have any difficulty performing the following activities: 04/29/2016  Hearing? N  Vision? N  Difficulty concentrating or making decisions? N  Walking or climbing stairs? N  Dressing or bathing? N  Doing errands, shopping? N  Preparing Food and eating ? N  Using the Toilet? N  In the past six months, have you accidently leaked urine? Y  Do you have problems with loss of bowel control? N  Managing your Medications? N  Managing your Finances? N  Housekeeping or managing your Housekeeping? N  Some recent data might be hidden    Patient Care Team: Ria Bush, MD as PCP - General (Family Medicine) Christene Lye, MD (General Surgery)    Assessment:     Hearing Screening   125Hz  250Hz  500Hz  1000Hz  2000Hz  3000Hz  4000Hz  6000Hz  8000Hz   Right ear:   40 40 40  40    Left ear:   40 40 40  40    Vision Screening Comments: Last vision exam in May 2017 with Dr. Gershon Crane   Exercise Activities and Dietary recommendations Current Exercise Habits: Home exercise routine, Type of exercise: walking, Time (Minutes): 60, Frequency (Times/Week): 6, Weekly Exercise (Minutes/Week): 360, Intensity: Mild, Exercise limited by: None identified  Goals    . Increase physical activity          When schedule permits, I will walk at least 60 min 5-6 days per week.       Fall Risk Fall Risk  04/29/2016 03/07/2015 03/07/2015  Falls in the past year? Yes Yes No  Number falls in past yr: 1 1 -  Injury with Fall? No Yes -  Risk for fall due to : - History of fall(s) -  Follow up - Education provided;Falls prevention discussed -   Depression Screen PHQ 2/9 Scores 04/29/2016 03/07/2015  PHQ - 2 Score 0 0     Cognitive Function MMSE - Mini Mental State Exam 04/29/2016  Orientation to time 5  Orientation to Place 5  Registration 3  Attention/ Calculation 0    Recall 3  Language- name 2 objects 0  Language- repeat 1  Language- follow 3 step command 3  Language- read & follow direction 0  Write a sentence 0  Copy design 0  Total score 20        Immunization History  Administered Date(s) Administered  . Influenza Split 10/23/2010, 12/16/2011  . Influenza Whole 11/23/2007, 10/31/2008, 12/28/2009  .  Influenza,inj,Quad PF,36+ Mos 12/26/2012, 12/29/2013, 12/27/2014, 11/15/2015  . Pneumococcal Conjugate-13 03/07/2015  . Pneumococcal Polysaccharide-23 09/22/2011  . Td 02/28/1998, 05/09/2008   Screening Tests Health Maintenance  Topic Date Due  . DTaP/Tdap/Td (1 - Tdap) 04/30/2018 (Originally 05/10/2008)  . OPHTHALMOLOGY EXAM  06/12/2016  . HEMOGLOBIN A1C  06/21/2016  . PNA vac Low Risk Adult (2 of 2 - PPSV23) 09/21/2016  . FOOT EXAM  09/25/2016  . MAMMOGRAM  02/13/2018  . TETANUS/TDAP  05/10/2018  . COLONOSCOPY  07/24/2018  . INFLUENZA VACCINE  Completed  . DEXA SCAN  Completed  . Hepatitis C Screening  Completed      Plan:     I have personally reviewed and addressed the Medicare Annual Wellness questionnaire and have noted the following in the patient's chart:  A. Medical and social history B. Use of alcohol, tobacco or illicit drugs  C. Current medications and supplements D. Functional ability and status E.  Nutritional status F.  Physical activity G. Advance directives H. List of other physicians I.  Hospitalizations, surgeries, and ER visits in previous 12 months J.  [place] to include hearing, vision, cognitive, depression L. Referrals and appointments - none  In addition, I have reviewed and discussed with patient certain preventive protocols, quality metrics, and best practice recommendations. A written personalized care plan for preventive services as well as general preventive health recommendations were provided to patient.  See attached scanned questionnaire for additional information.   Signed,    Lindell Noe, MHA, BS, LPN Health Coach

## 2016-04-29 NOTE — Progress Notes (Signed)
PCP notes:   Health maintenance:  No gaps identified.  Abnormal screenings:   Fall risk - hx of fall without injury  Patient concerns:   Urinary incontinence - Per pt, she leaks a small amount once per week when she coughs or sneezes. Interference with life is slightly: 3/10.   Nurse concerns:  None  Next PCP appt:   05/04/16 @ 1500

## 2016-05-01 DIAGNOSIS — K119 Disease of salivary gland, unspecified: Secondary | ICD-10-CM | POA: Diagnosis not present

## 2016-05-01 DIAGNOSIS — K118 Other diseases of salivary glands: Secondary | ICD-10-CM | POA: Insufficient documentation

## 2016-05-02 NOTE — Progress Notes (Signed)
I reviewed health advisor's note, was available for consultation, and agree with documentation and plan.  

## 2016-05-04 ENCOUNTER — Encounter: Payer: Self-pay | Admitting: Family Medicine

## 2016-05-04 ENCOUNTER — Ambulatory Visit (INDEPENDENT_AMBULATORY_CARE_PROVIDER_SITE_OTHER): Payer: PPO | Admitting: Family Medicine

## 2016-05-04 VITALS — BP 140/84 | HR 72 | Temp 98.5°F | Wt 173.0 lb

## 2016-05-04 DIAGNOSIS — Z7189 Other specified counseling: Secondary | ICD-10-CM

## 2016-05-04 DIAGNOSIS — E1165 Type 2 diabetes mellitus with hyperglycemia: Secondary | ICD-10-CM | POA: Diagnosis not present

## 2016-05-04 DIAGNOSIS — M8580 Other specified disorders of bone density and structure, unspecified site: Secondary | ICD-10-CM

## 2016-05-04 DIAGNOSIS — I10 Essential (primary) hypertension: Secondary | ICD-10-CM | POA: Diagnosis not present

## 2016-05-04 DIAGNOSIS — E782 Mixed hyperlipidemia: Secondary | ICD-10-CM

## 2016-05-04 DIAGNOSIS — R946 Abnormal results of thyroid function studies: Secondary | ICD-10-CM

## 2016-05-04 DIAGNOSIS — Z0000 Encounter for general adult medical examination without abnormal findings: Secondary | ICD-10-CM | POA: Diagnosis not present

## 2016-05-04 DIAGNOSIS — R591 Generalized enlarged lymph nodes: Secondary | ICD-10-CM

## 2016-05-04 DIAGNOSIS — IMO0001 Reserved for inherently not codable concepts without codable children: Secondary | ICD-10-CM

## 2016-05-04 NOTE — Assessment & Plan Note (Signed)
Living will scanned into chart. Husband Elizabeth Curtis then son Elizabeth Curtis are [place]. Does not want prolonged life support if terminally or incurably ill.

## 2016-05-04 NOTE — Progress Notes (Signed)
Pre visit review using our clinic review tool, if applicable. No additional management support is needed unless otherwise documented below in the visit note. 

## 2016-05-04 NOTE — Assessment & Plan Note (Signed)
Preventative protocols reviewed and updated unless pt declined. Discussed healthy diet and lifestyle.  

## 2016-05-04 NOTE — Assessment & Plan Note (Addendum)
Chronic. Intolerant to statins. Has not started welchol yet. Encouraged she try this medication.  ASCVD 10 yr risk = 20.6% Goal LDL <100

## 2016-05-04 NOTE — Assessment & Plan Note (Signed)
Reviewed deteriorated A1c and discussed further treatment options.  Pt desires to research options - will look into [place] and trulicity.  Metformin XR may be causing ongoing GI upset.  Did not do well with avandia - does not want to try actos.

## 2016-05-04 NOTE — Assessment & Plan Note (Signed)
Chronic, stable. Continue current regimen. 

## 2016-05-04 NOTE — Patient Instructions (Addendum)
Sugar remains too high. Look into trulicity (weekly shot) or [place] family as next options. Let me know which you'd prefer.  I will keep an eye out for CT scan results.  Try out the colesevelam cholesterol medicine. Return to see me in 4 months for diabetes follow up.   Health Maintenance, Female Adopting a healthy lifestyle and getting preventive care can go a long way to promote health and wellness. Talk with your health care provider about what schedule of regular examinations is right for you. This is a good chance for you to check in with your provider about disease prevention and staying healthy. In between checkups, there are plenty of things you can do on your own. Experts have done a lot of research about which lifestyle changes and preventive measures are most likely to keep you healthy. Ask your health care provider for more information. Weight and diet Eat a healthy diet  Be sure to include plenty of vegetables, fruits, low-fat dairy products, and lean protein.  Do not eat a lot of foods high in solid fats, added sugars, or salt.  Get regular exercise. This is one of the most important things you can do for your health.  Most adults should exercise for at least 150 minutes each week. The exercise should increase your heart rate and make you sweat (moderate-intensity exercise).  Most adults should also do strengthening exercises at least twice a week. This is in addition to the moderate-intensity exercise. Maintain a healthy weight  Body mass index (BMI) is a measurement that can be used to identify possible weight problems. It estimates body fat based on height and weight. Your health care provider can help determine your BMI and help you achieve or maintain a healthy weight.  For females 96 years of age and older:  A BMI below 18.5 is considered underweight.  A BMI of 18.5 to 24.9 is normal.  A BMI of 25 to 29.9 is considered overweight.  A BMI of 30 and above is  considered obese. Watch levels of cholesterol and blood lipids  You should start having your blood tested for lipids and cholesterol at 67 years of age, then have this test every 5 years.  You may need to have your cholesterol levels checked more often if:  Your lipid or cholesterol levels are high.  You are older than 67 years of age.  You are at high risk for heart disease. Cancer screening Lung Cancer  Lung cancer screening is recommended for adults 31-18 years old who are at high risk for lung cancer because of a history of smoking.  A yearly low-dose CT scan of the lungs is recommended for people who:  Currently smoke.  Have quit within the past 15 years.  Have at least a 30-pack-year history of smoking. A pack year is smoking an average of one pack of cigarettes a day for 1 year.  Yearly screening should continue until it has been 15 years since you quit.  Yearly screening should stop if you develop a health problem that would prevent you from having lung cancer treatment. Breast Cancer  Practice breast self-awareness. This means understanding how your breasts normally appear and feel.  It also means doing regular breast self-exams. Let your health care provider know about any changes, no matter how small.  If you are in your 20s or 30s, you should have a clinical breast exam (CBE) by a health care provider every 1-3 years as part of a regular health  exam.  If you are 40 or older, have a CBE every year. Also consider having a breast X-ray (mammogram) every year.  If you have a family history of breast cancer, talk to your health care provider about genetic screening.  If you are at high risk for breast cancer, talk to your health care provider about having an MRI and a mammogram every year.  Breast cancer gene (BRCA) assessment is recommended for women who have family members with BRCA-related cancers. BRCA-related cancers  include:  Breast.  Ovarian.  Tubal.  Peritoneal cancers.  Results of the assessment will determine the need for genetic counseling and BRCA1 and BRCA2 testing. Cervical Cancer  Your health care provider may recommend that you be screened regularly for cancer of the pelvic organs (ovaries, uterus, and vagina). This screening involves a pelvic examination, including checking for microscopic changes to the surface of your cervix (Pap test). You may be encouraged to have this screening done every 3 years, beginning at age 42.  For women ages 9-65, health care providers may recommend pelvic exams and Pap testing every 3 years, or they may recommend the Pap and pelvic exam, combined with testing for human papilloma virus (HPV), every 5 years. Some types of HPV increase your risk of cervical cancer. Testing for HPV may also be done on women of any age with unclear Pap test results.  Other health care providers may not recommend any screening for nonpregnant women who are considered low risk for pelvic cancer and who do not have symptoms. Ask your health care provider if a screening pelvic exam is right for you.  If you have had past treatment for cervical cancer or a condition that could lead to cancer, you need Pap tests and screening for cancer for at least 20 years after your treatment. If Pap tests have been discontinued, your risk factors (such as having a new sexual partner) need to be reassessed to determine if screening should resume. Some women have medical problems that increase the chance of getting cervical cancer. In these cases, your health care provider may recommend more frequent screening and Pap tests. Colorectal Cancer  This type of cancer can be detected and often prevented.  Routine colorectal cancer screening usually begins at 67 years of age and continues through 67 years of age.  Your health care provider may recommend screening at an earlier age if you have risk factors  for colon cancer.  Your health care provider may also recommend using home test kits to check for hidden blood in the stool.  A small camera at the end of a tube can be used to examine your colon directly (sigmoidoscopy or colonoscopy). This is done to check for the earliest forms of colorectal cancer.  Routine screening usually begins at age 15.  Direct examination of the colon should be repeated every 5-10 years through 67 years of age. However, you may need to be screened more often if early forms of precancerous polyps or small growths are found. Skin Cancer  Check your skin from head to toe regularly.  Tell your health care provider about any new moles or changes in moles, especially if there is a change in a mole's shape or color.  Also tell your health care provider if you have a mole that is larger than the size of a pencil eraser.  Always use sunscreen. Apply sunscreen liberally and repeatedly throughout the day.  Protect yourself by wearing long sleeves, pants, a wide-brimmed hat, and  sunglasses whenever you are outside. Heart disease, diabetes, and high blood pressure  High blood pressure causes heart disease and increases the risk of stroke. High blood pressure is more likely to develop in:  People who have blood pressure in the high end of the normal range (130-139/85-89 mm Hg).  People who are overweight or obese.  People who are African American.  If you are 71-12 years of age, have your blood pressure checked every 3-5 years. If you are 23 years of age or older, have your blood pressure checked every year. You should have your blood pressure measured twice-once when you are at a hospital or clinic, and once when you are not at a hospital or clinic. Record the average of the two measurements. To check your blood pressure when you are not at a hospital or clinic, you can use:  An automated blood pressure machine at a pharmacy.  A home blood pressure monitor.  If you  are between 102 years and 3 years old, ask your health care provider if you should take aspirin to prevent strokes.  Have regular diabetes screenings. This involves taking a blood sample to check your fasting blood sugar level.  If you are at a normal weight and have a low risk for diabetes, have this test once every three years after 68 years of age.  If you are overweight and have a high risk for diabetes, consider being tested at a younger age or more often. Preventing infection Hepatitis B  If you have a higher risk for hepatitis B, you should be screened for this virus. You are considered at high risk for hepatitis B if:  You were born in a country where hepatitis B is common. Ask your health care provider which countries are considered high risk.  Your parents were born in a high-risk country, and you have not been immunized against hepatitis B (hepatitis B vaccine).  You have HIV or AIDS.  You use needles to inject street drugs.  You live with someone who has hepatitis B.  You have had sex with someone who has hepatitis B.  You get hemodialysis treatment.  You take certain medicines for conditions, including cancer, organ transplantation, and autoimmune conditions. Hepatitis C  Blood testing is recommended for:  Everyone born from [birth year] through 1966.  Anyone with known risk factors for hepatitis C. Sexually transmitted infections (STIs)  You should be screened for sexually transmitted infections (STIs) including gonorrhea and chlamydia if:  You are sexually active and are younger than 67 years of age.  You are older than 67 years of age and your health care provider tells you that you are at risk for this type of infection.  Your sexual activity has changed since you were last screened and you are at an increased risk for chlamydia or gonorrhea. Ask your health care provider if you are at risk.  If you do not have HIV, but are at risk, it may be recommended that you  take a prescription medicine daily to prevent HIV infection. This is called pre-exposure prophylaxis (PrEP). You are considered at risk if:  You are sexually active and do not regularly use condoms or know the HIV status of your partner(s).  You take drugs by injection.  You are sexually active with a partner who has HIV. Talk with your health care provider about whether you are at high risk of being infected with HIV. If you choose to begin PrEP, you should first be tested  for HIV. You should then be tested every 3 months for as long as you are taking PrEP. Pregnancy  If you are premenopausal and you may become pregnant, ask your health care provider about preconception counseling.  If you may become pregnant, take 400 to 800 micrograms (mcg) of folic acid every day.  If you want to prevent pregnancy, talk to your health care provider about birth control (contraception). Osteoporosis and menopause  Osteoporosis is a disease in which the bones lose minerals and strength with aging. This can result in serious bone fractures. Your risk for osteoporosis can be identified using a bone density scan.  If you are 63 years of age or older, or if you are at risk for osteoporosis and fractures, ask your health care provider if you should be screened.  Ask your health care provider whether you should take a calcium or vitamin D supplement to lower your risk for osteoporosis.  Menopause may have certain physical symptoms and risks.  Hormone replacement therapy may reduce some of these symptoms and risks. Talk to your health care provider about whether hormone replacement therapy is right for you. Follow these instructions at home:  Schedule regular health, dental, and eye exams.  Stay current with your immunizations.  Do not use any tobacco products including cigarettes, chewing tobacco, or electronic cigarettes.  If you are pregnant, do not drink alcohol.  If you are breastfeeding, limit  how much and how often you drink alcohol.  Limit alcohol intake to no more than 1 drink per day for nonpregnant women. One drink equals 12 ounces of beer, 5 ounces of wine, or 1 ounces of hard liquor.  Do not use street drugs.  Do not share needles.  Ask your health care provider for help if you need support or information about quitting drugs.  Tell your health care provider if you often feel depressed.  Tell your health care provider if you have ever been abused or do not feel safe at home. This information is not intended to replace advice given to you by your health care provider. Make sure you discuss any questions you have with your health care provider. Document Released: 08/11/2010 Document Revised: 07/04/2015 Document Reviewed: 10/30/2014 Elsevier Interactive Patient Education  2017 [employer].

## 2016-05-04 NOTE — Assessment & Plan Note (Addendum)
TSH has remained stable on rechecks.

## 2016-05-04 NOTE — Assessment & Plan Note (Signed)
Persistent R parotid LAD. Saw ENT, concern for parotid tumor. Pending CT and then to consider FNA biopsy.

## 2016-05-04 NOTE — Progress Notes (Signed)
BP 140/84 (BP Location: Right Arm, Cuff Size: Large)   Pulse 72   Temp 98.5 F (36.9 C) (Oral)   Wt 173 lb (78.5 kg)   BMI 25.55 kg/m    CC: CPE Subjective:    Patient ID: Elizabeth Curtis, female    DOB: 07/14/1950, 66 y.o.   MRN: [id number]  HPI: Elizabeth Curtis is a 66 y.o. female presenting on 05/04/2016 for Annual Exam   Saw Katha Cabal last week for medicare wellness visit. Note reviewed.   Saw ENT Dr Janace Hoard for R parotid LAD 12/2015 - pending imaging and possible FNA.  DM - did not tolerate avandia in the past.  Preventative: Colonoscopy - 2010 [place], 1 polyp, 64mm lipoma, o/w normal, rec rpt 10 yrs.  Well woman at [facility], Dr Leonides Schanz 01/2016. Pap normal 01/2016. LMP - 2010. Menopausal.  Mammogram - Birads1 02/2016 DEXA 03/27/15 Osteopenia - not regular with calcium or vit D, hasn't been walking regularly Flu shot yearly. Tetanus 2010.  Pneumovax 09/2011 (felt sick afterwards). Prevnar 02/2015 Shingles shot - interested in shingrix - will discuss at next visit.  Living will scanned into chart. Husband Chrissie Noa then son Gerald Stabs are [place]. Does not want prolonged life support if terminally or incurably ill.  Seat belt use discussed Sunscreen use discussed, no changing skin lesions. 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  Activity: 2.5 mi most days walking Diet: good water, daily fruits/vegetables  Relevant past medical, surgical, family and social history reviewed and updated as indicated. Interim medical history since our last visit reviewed. Allergies and medications reviewed and updated. Outpatient Medications Prior to Visit  Medication Sig Dispense Refill  . amLODipine (NORVASC) 5 MG tablet TAKE 1 TABLET (5 MG TOTAL) BY MOUTH AT BEDTIME. 90 tablet 2  . enalapril (VASOTEC) 20 MG tablet TAKE 1  TABLET (20 MG TOTAL) BY MOUTH 2 (TWO) TIMES DAILY. 180 tablet 2  . glipiZIDE (GLUCOTROL) 5 MG tablet Take 1 tablet (5 mg total) by mouth as directed. Take 10mg  in am and 5mg  in pm    . metFORMIN (GLUCOPHAGE XR) 500 MG 24 hr tablet Take one tablet in the AM and two in the PM with meals 270 tablet 3  . colesevelam (WELCHOL) 625 MG tablet Take 1 tablet (625 mg total) by mouth 2 (two) times daily with a meal. (Patient not taking: Reported on 12/27/2015) 60 tablet 3  . glucose blood (FREESTYLE LITE) test strip Use as instructed 100 each 12  . mupirocin cream (BACTROBAN) 2 % Apply 1 application topically 2 (two) times daily. To nares (Patient not taking: Reported on 05/04/2016) 15 g 1  . OVER THE COUNTER MEDICATION Take 1 capsule by mouth 2 (two) times daily. VISION SUPPLEMENT    . triamcinolone cream (KENALOG) 0.1 % Apply 1 application topically 2 (two) times daily. Apply to AA. (Patient not taking: Reported on 05/04/2016) 30 g 0  . trimethoprim-polymyxin b (POLYTRIM) ophthalmic solution Place 1 drop into both eyes every 4 (four) hours. (Patient not taking: Reported on 04/29/2016) 10 mL 0   No facility-administered medications prior to visit.      Per HPI unless specifically indicated in ROS section below Review of Systems  Constitutional: Negative for activity change, appetite change, chills, fatigue, fever and unexpected weight change.  HENT: Positive for congestion (chronic nasal). Negative for hearing  loss.   Eyes: Negative for visual disturbance.  Respiratory: Negative for cough, chest tightness, shortness of breath and wheezing.   Cardiovascular: Negative for chest pain, palpitations and leg swelling.  Gastrointestinal: Positive for nausea. Negative for abdominal distention, abdominal pain, blood in stool, constipation, diarrhea and vomiting.       Gassiness with metformin  Genitourinary: Negative for difficulty urinating and hematuria.  Musculoskeletal: Negative for arthralgias, myalgias and  neck pain.  Skin: Negative for rash.  Neurological: Negative for dizziness, seizures, syncope and headaches.  Hematological: Negative for adenopathy. Does not bruise/bleed easily.  Psychiatric/Behavioral: Negative for dysphoric mood. The patient is not nervous/anxious.        Objective:    BP 140/84 (BP Location: Right Arm, Cuff Size: Large)   Pulse 72   Temp 98.5 F (36.9 C) (Oral)   Wt 173 lb (78.5 kg)   BMI 25.55 kg/m   Wt Readings from Last 3 Encounters:  05/04/16 173 lb (78.5 kg)  04/29/16 173 lb 4 oz (78.6 kg)  12/27/15 169 lb 12 oz (77 kg)    Physical Exam  Constitutional: She is oriented to person, place, and time. She appears well-developed and well-nourished. No distress.  HENT:  Head: Normocephalic and atraumatic.  Right Ear: Hearing, tympanic membrane, external ear and ear canal normal.  Left Ear: Hearing, tympanic membrane, external ear and ear canal normal.  Nose: Nose normal.  Mouth/Throat: Uvula is midline, oropharynx is clear and moist and mucous membranes are normal. No oropharyngeal exudate, posterior oropharyngeal edema or posterior oropharyngeal erythema.  Persistent firm lump R ankle of mandible, nontender  Eyes: Conjunctivae and EOM are normal. Pupils are equal, round, and reactive to light. No scleral icterus.  Neck: Normal range of motion. Neck supple. Carotid bruit is not present. No thyromegaly present.  Cardiovascular: Normal rate, regular rhythm, normal heart sounds and intact distal pulses.   No murmur heard. Pulses:      Radial pulses are 2+ on the right side, and 2+ on the left side.  Pulmonary/Chest: Effort normal and breath sounds normal. No respiratory distress. She has no wheezes. She has no rales.  Abdominal: Soft. Bowel sounds are normal. She exhibits no distension and no mass. There is no tenderness. There is no rebound and no guarding.  Musculoskeletal: Normal range of motion. She exhibits no edema.  Lymphadenopathy:    She has no  cervical adenopathy.  Neurological: She is alert and oriented to person, place, and time.  CN grossly intact, station and gait intact  Skin: Skin is warm and dry. No rash noted.  Psychiatric: She has a normal mood and affect. Her behavior is normal. Judgment and thought content normal.  Nursing note and vitals reviewed.  Results for orders placed or performed in visit on 04/29/16  Lipid panel  Result Value Ref Range   Cholesterol 204 (H) 0 - 200 mg/dL   Triglycerides 192.0 (H) 0.0 - 149.0 mg/dL   HDL 39.70 >39.00 mg/dL   VLDL 38.4 0.0 - 40.0 mg/dL   LDL Cholesterol 126 (H) 0 - 99 mg/dL   Total CHOL/HDL Ratio 5    NonHDL 164.43   Comprehensive metabolic panel  Result Value Ref Range   Sodium 139 135 - 145 mEq/L   Potassium 4.2 3.5 - 5.1 mEq/L   Chloride 103 96 - 112 mEq/L   CO2 29 19 - 32 mEq/L   Glucose, Bld 208 (H) 70 - 99 mg/dL   BUN 14 6 - 23 mg/dL  Creatinine, Ser 0.69 0.40 - 1.20 mg/dL   Total Bilirubin 0.5 0.2 - 1.2 mg/dL   Alkaline Phosphatase 53 39 - 117 U/L   AST 12 0 - 37 U/L   ALT 16 0 - 35 U/L   Total Protein 6.9 6.0 - 8.3 g/dL   Albumin 4.5 3.5 - 5.2 g/dL   Calcium 9.5 8.4 - 10.5 mg/dL   GFR 90.39 >60.00 mL/min  TSH  Result Value Ref Range   TSH 0.72 0.35 - 4.50 uIU/mL  Hemoglobin A1c  Result Value Ref Range   Hgb A1c MFr Bld 8.1 (H) 4.6 - 6.5 %      Assessment & Plan:   Problem List Items Addressed This Visit    Abnormal thyroid function test    TSH has remained stable on rechecks.       Advanced care planning/counseling discussion    Living will scanned into chart. Husband Chrissie Noa then son Gerald Stabs are [place]. Does not want prolonged life support if terminally or incurably ill.       Diabetes mellitus type 2, uncontrolled ([place])    Reviewed deteriorated A1c and discussed further treatment options.  Pt desires to research options - will look into [place] and trulicity.  Metformin XR may be causing ongoing GI upset.  Did not do well with avandia -  does not want to try actos.       Essential hypertension, benign    Chronic, stable. Continue current regimen.       Healthcare maintenance - Primary    Preventative protocols reviewed and updated unless pt declined. Discussed healthy diet and lifestyle.       HLD (hyperlipidemia)    Chronic. Intolerant to statins. Has not started welchol yet. Encouraged she try this medication.  ASCVD 10 yr risk = 20.6% Goal LDL <100      Lymphadenopathy of head and neck    Persistent R parotid LAD. Saw ENT, concern for parotid tumor. Pending CT and then to consider FNA biopsy.       Osteopenia    Reviewed calcium and vit D dosing as well as regular weight bearing exercise          Follow up plan: Return in about 4 months (around 09/03/2016) for follow up visit.  Ria Bush, MD

## 2016-05-04 NOTE — Assessment & Plan Note (Signed)
Reviewed calcium and vit D dosing as well as regular weight bearing exercise

## 2016-05-07 ENCOUNTER — Other Ambulatory Visit: Payer: Self-pay | Admitting: Otolaryngology

## 2016-05-07 DIAGNOSIS — K118 Other diseases of salivary glands: Secondary | ICD-10-CM

## 2016-05-10 DIAGNOSIS — I10 Essential (primary) hypertension: Secondary | ICD-10-CM | POA: Diagnosis not present

## 2016-05-10 DIAGNOSIS — J111 Influenza due to unidentified influenza virus with other respiratory manifestations: Secondary | ICD-10-CM | POA: Diagnosis not present

## 2016-05-14 ENCOUNTER — Encounter: Payer: Self-pay | Admitting: Family Medicine

## 2016-05-15 MED ORDER — OSELTAMIVIR PHOSPHATE 75 MG PO CAPS: 75.0000 mg | ORAL_CAPSULE | Freq: Two times a day (BID) | ORAL | 0 refills | Status: DC

## 2016-06-01 ENCOUNTER — Ambulatory Visit
Admission: RE | Admit: 2016-06-01 | Discharge: 2016-06-01 | Disposition: A | Discharge status: Home / Self Care | Payer: PPO | Source: Ambulatory Visit | Attending: Otolaryngology | Admitting: Otolaryngology

## 2016-06-01 DIAGNOSIS — R221 Localized swelling, mass and lump, neck: Secondary | ICD-10-CM | POA: Diagnosis not present

## 2016-06-01 DIAGNOSIS — K118 Other diseases of salivary glands: Secondary | ICD-10-CM

## 2016-06-01 IMAGING — CT CT NECK W/ CM
2 of 4 series · 5 of 14 positions shown, 6 images · IV contrast (iopamidol)
Comparison: Brain MRI [DATE]

CLINICAL DATA: 65-year-old female with palpable abnormality of the
right neck below the ear lobe since [DATE].

Personal history of treated abdominal melanoma in [0O].
EXAM:
CT NECK WITH CONTRAST
TECHNIQUE: Multidetector CT imaging of the neck was performed using the
standard protocol following the bolus administration of intravenous
contrast.
CONTRAST:  75mL [0O] IOPAMIDOL ([0O]) INJECTION 61%

[Series 3: neck · axial · 0.38mm/px · z∈[-242,-162]mm · 2 of 120 slices shown, 3 images]
[im 40/120  soft-tissue]
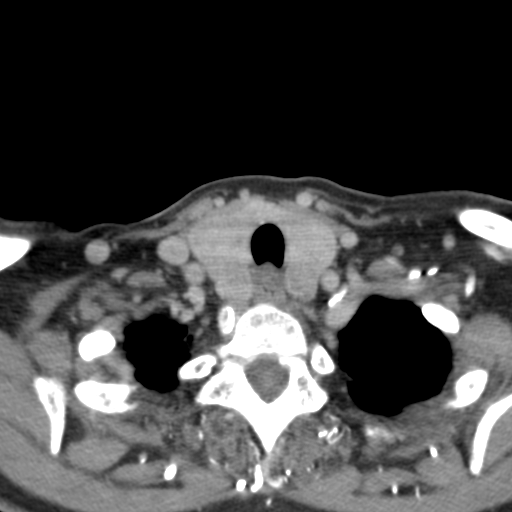
[im 40/120  bone]
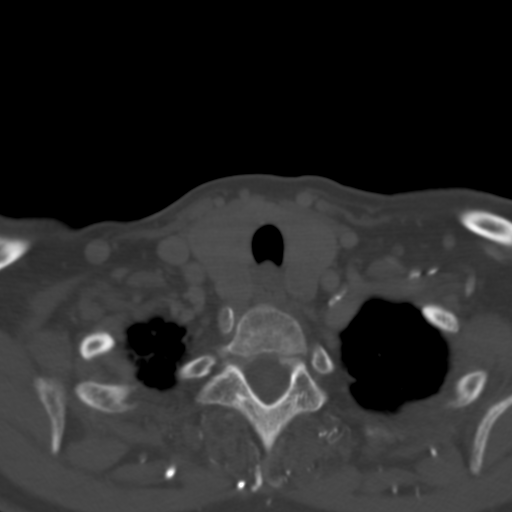
[im 80/120  bone]
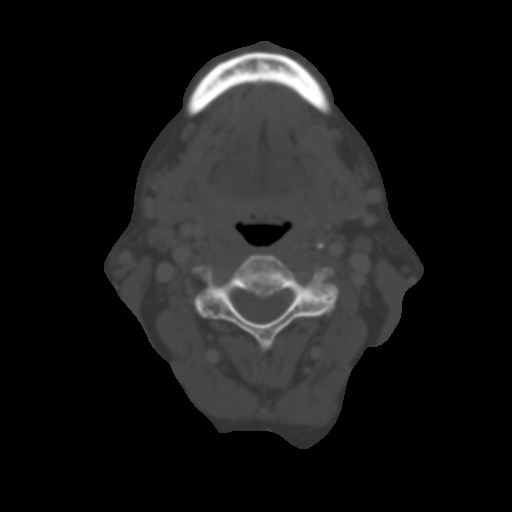

[Series 8: angled axial-oropharynx · axial · 0.39mm/px · z∈[-281,-163]mm · 3 of 120 slices shown]
[im 30/120  bone]
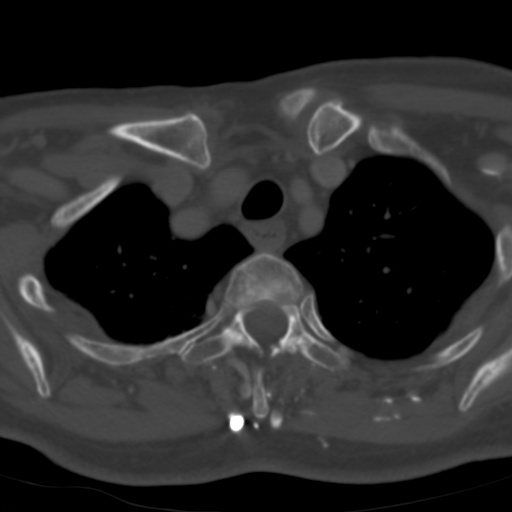
[im 60/120  bone]
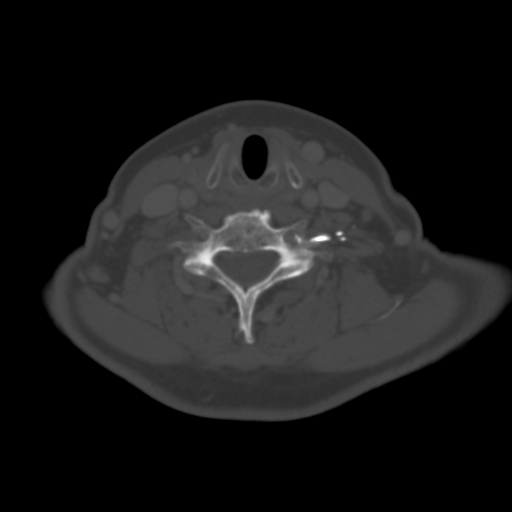
[im 90/120  bone]
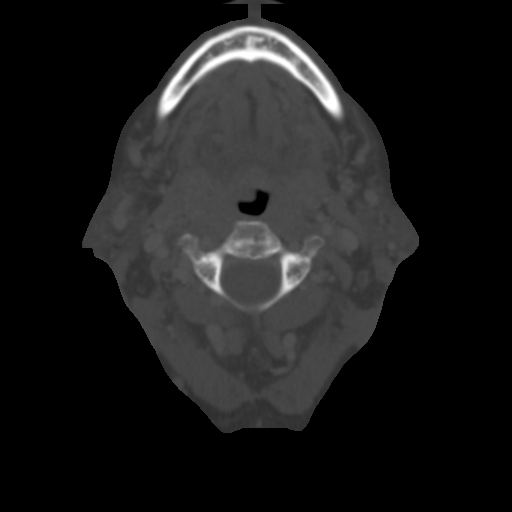

[5 of 14 positions shown; findings below may reference images not displayed]

FINDINGS: Pharynx and larynx: Negative larynx and pharynx soft tissue
contours. Parapharyngeal and retropharyngeal spaces are within
normal limits.

Salivary glands: Negative sublingual space. Submandibular glands
appear symmetric and within normal limits.

The palpable area of concern is marked along the posterior inferior
right parotid space as seen on series 3, image 32. There is dental
hardware streak artifact in the region. Overall parotid gland size
and configuration appears stable since the [0O] MRI. No discrete
right parotid space mass or lesion is identified. Gland density
appears symmetric. The left parotid appears within normal limits.

Thyroid: Subcentimeter by CT bilateral thyroid nodules do not meet
size criteria for ultrasound follow-up. There is mild generalized
thyromegaly.

Lymph nodes: No right side cervical lymphadenopathy to explain the
palpable area of concern. Bilateral cervical lymph nodes are
symmetric, a diminutive, and normal.

Vascular: Major vascular structures in the neck and at the skullbase
are patent.

Limited intracranial: Negative.

Visualized orbits: Negative.

Mastoids and visualized paranasal sinuses: Clear.

Skeleton: Widespread cervical spine disc and endplate degeneration.
Occasional left cervical facet degeneration. No acute or suspicious
osseous lesion identified. No acute osseous abnormality identified.

Upper chest: Negative lung apices aside from mild scarring and
dependent atelectasis. No superior mediastinal lymphadenopathy.
Visible axillary lymph nodes are normal. Cervicothoracic junction
level paravertebral venous contrast reflux appears related to
functional narrowing of the left innominate vein in this patient who
was injected from the left upper extremity.
IMPRESSION: 1. No convincing right parotid space mass or abnormality to explain
the palpable area of concern.
If the finding remains suspicious on clinical exam then recommend
follow-up MRI evaluation with Face MRI - or alternatively IAC
protocol Brain MRI (specified to include the entire right parotid
gland) - without and with contrast. And the MRI appearance of the
right parotid gland could be compared to that on the brain MRI from
[DATE].
[DATE]. Generalized thyromegaly. Subcentimeter bilateral thyroid nodules
do not meet consensus criteria for thyroid ultrasound follow-up.
3. Cervical spine degeneration.

## 2016-06-01 MED ORDER — IOPAMIDOL (ISOVUE-300) INJECTION 61%
75.0000 mL | Freq: Once | INTRAVENOUS | Status: AC | PRN
  Administered 2016-06-01: 75 mL via INTRAVENOUS

## 2016-06-02 ENCOUNTER — Other Ambulatory Visit: Payer: Self-pay | Admitting: Family Medicine

## 2016-06-02 ENCOUNTER — Encounter: Payer: Self-pay | Admitting: Family Medicine

## 2016-06-02 MED ORDER — GLIPIZIDE 5 MG PO TABS: 5.0000 mg | ORAL_TABLET | ORAL | 3 refills | Status: DC

## 2016-06-02 MED ORDER — GLUCOSE BLOOD VI STRP: ORAL_STRIP | 3 refills | Status: DC

## 2016-06-03 ENCOUNTER — Other Ambulatory Visit: Payer: Self-pay | Admitting: Otolaryngology

## 2016-06-03 DIAGNOSIS — K119 Disease of salivary gland, unspecified: Secondary | ICD-10-CM | POA: Diagnosis not present

## 2016-06-18 DIAGNOSIS — H5213 Myopia, bilateral: Secondary | ICD-10-CM | POA: Diagnosis not present

## 2016-06-18 DIAGNOSIS — H524 Presbyopia: Secondary | ICD-10-CM | POA: Diagnosis not present

## 2016-06-18 DIAGNOSIS — E119 Type 2 diabetes mellitus without complications: Secondary | ICD-10-CM | POA: Diagnosis not present

## 2016-06-18 DIAGNOSIS — Z961 Presence of intraocular lens: Secondary | ICD-10-CM | POA: Diagnosis not present

## 2016-06-18 LAB — HM DIABETES EYE EXAM

## 2016-06-19 ENCOUNTER — Encounter: Payer: Self-pay | Admitting: Family Medicine

## 2016-07-15 ENCOUNTER — Other Ambulatory Visit: Payer: Self-pay

## 2016-07-15 NOTE — Telephone Encounter (Signed)
Received a fax from pt's pharmacy for new directions for GLIPIZIDE 5MG  TID, insurance will not cover gap rx. Okay to send in?

## 2016-07-17 MED ORDER — GLIPIZIDE 5 MG PO TABS: 5.0000 mg | ORAL_TABLET | ORAL | 3 refills | Status: DC

## 2016-08-24 ENCOUNTER — Encounter: Payer: Self-pay | Admitting: Family Medicine

## 2016-08-24 DIAGNOSIS — E1165 Type 2 diabetes mellitus with hyperglycemia: Principal | ICD-10-CM

## 2016-08-24 DIAGNOSIS — E782 Mixed hyperlipidemia: Secondary | ICD-10-CM

## 2016-08-24 DIAGNOSIS — IMO0001 Reserved for inherently not codable concepts without codable children: Secondary | ICD-10-CM

## 2016-08-31 ENCOUNTER — Other Ambulatory Visit (INDEPENDENT_AMBULATORY_CARE_PROVIDER_SITE_OTHER): Payer: PPO

## 2016-08-31 DIAGNOSIS — IMO0001 Reserved for inherently not codable concepts without codable children: Secondary | ICD-10-CM

## 2016-08-31 DIAGNOSIS — E1165 Type 2 diabetes mellitus with hyperglycemia: Secondary | ICD-10-CM | POA: Diagnosis not present

## 2016-08-31 DIAGNOSIS — E782 Mixed hyperlipidemia: Secondary | ICD-10-CM

## 2016-08-31 LAB — LIPID PANEL
CHOL/HDL RATIO: 4
Cholesterol: 161 mg/dL (ref 0–200)
HDL: 39.5 mg/dL (ref >39.00)
LDL Cholesterol: 93 mg/dL (ref 0–99)
NONHDL: 121.68
Triglycerides: 143 mg/dL (ref 0.0–149.0)
VLDL: 28.6 mg/dL (ref 0.0–40.0)

## 2016-08-31 LAB — BASIC METABOLIC PANEL
BUN: 14 mg/dL (ref 6–23)
CALCIUM: 9.3 mg/dL (ref 8.4–10.5)
CO2: 29 mEq/L (ref 19–32)
CREATININE: 0.7 mg/dL (ref 0.40–1.20)
Chloride: 103 mEq/L (ref 96–112)
GFR: 88.81 mL/min (ref >60.00)
GLUCOSE: 195 mg/dL — AB (ref 70–99)
Potassium: 4.1 mEq/L (ref 3.5–5.1)
Sodium: 139 mEq/L (ref 135–145)

## 2016-08-31 LAB — HEMOGLOBIN A1C: Hgb A1c MFr Bld: 7.9 % — ABNORMAL HIGH (ref 4.6–6.5)

## 2016-09-03 ENCOUNTER — Encounter: Payer: Self-pay | Admitting: Family Medicine

## 2016-09-03 ENCOUNTER — Ambulatory Visit (INDEPENDENT_AMBULATORY_CARE_PROVIDER_SITE_OTHER): Payer: PPO | Admitting: Family Medicine

## 2016-09-03 VITALS — BP 124/82 | HR 70 | Temp 98.2°F | Wt 168.0 lb

## 2016-09-03 DIAGNOSIS — E782 Mixed hyperlipidemia: Secondary | ICD-10-CM | POA: Diagnosis not present

## 2016-09-03 DIAGNOSIS — R591 Generalized enlarged lymph nodes: Secondary | ICD-10-CM

## 2016-09-03 DIAGNOSIS — I10 Essential (primary) hypertension: Secondary | ICD-10-CM | POA: Diagnosis not present

## 2016-09-03 DIAGNOSIS — E1165 Type 2 diabetes mellitus with hyperglycemia: Secondary | ICD-10-CM | POA: Diagnosis not present

## 2016-09-03 DIAGNOSIS — IMO0001 Reserved for inherently not codable concepts without codable children: Secondary | ICD-10-CM

## 2016-09-03 MED ORDER — DULAGLUTIDE 0.75 MG/0.5ML ~~LOC~~ SOAJ: 0.7500 mg | SUBCUTANEOUS | 3 refills | Status: DC

## 2016-09-03 NOTE — Progress Notes (Signed)
BP 124/82   Pulse 70   Temp 98.2 F (36.8 C) (Oral)   Wt 168 lb (76.2 kg)   SpO2 99%   BMI 24.81 kg/m    CC: f/u visit Subjective:    Patient ID: Elizabeth Curtis, female    DOB: 1950/06/11, 66 y.o.   MRN: [id number]  HPI: Elizabeth Curtis is a 66 y.o. female presenting on 09/03/2016 for Follow-up   Parotid swelling - saw ENT Janace Hoard) - CT unrevealing. Thought just firm parotid tissue not mass. Will monitor for now.   5 lb weight loss noted. She is walking more regularly.   DM - see prior note for details. Pt was to investigate options for further diabetes treatment (januvia vs trulicity). Regularly does check sugars and brings log - 170s post prandial, 170s fasting as well.  Compliant with antihyperglycemic regimen which includes: metformin XR 500mg  in am and 1000mg  in pm, and glipizide 10mg  in am and 5mg  in pm. Denies low sugars or hypoglycemic symptoms.  Denies paresthesias. Last diabetic eye exam 06/2016 - no DR.  Pneumovax: 2013.  Prevnar: 2017. Lab Results  Component Value Date   HGBA1C 7.9 (H) 08/31/2016   Diabetic Foot Exam - Simple   Simple Foot Form Diabetic Foot exam was performed with the following findings:  Yes 09/03/2016 10:34 AM  Visual Inspection No deformities, no ulcerations, no other skin breakdown bilaterally:  Yes Sensation Testing Intact to touch and monofilament testing bilaterally:  Yes Pulse Check Posterior Tibialis and Dorsalis pulse intact bilaterally:  Yes Comments      HLD - intolerant to statins. Last visit I recommended starting welchol. She took once daily for several weeks but then tapered off, unclear why.   Relevant past medical, surgical, family and social history reviewed and updated as indicated. Interim medical history since our last visit reviewed. Allergies and medications reviewed and updated. Outpatient Medications Prior to Visit  Medication Sig Dispense Refill  . amLODipine (NORVASC) 5 MG tablet TAKE 1 TABLET (5 MG TOTAL) BY  MOUTH AT BEDTIME. 90 tablet 2  . Calcium Carbonate-Vitamin D3 (CALCIUM 600+D3) 600-400 MG-UNIT TABS Take 1 tablet by mouth daily.    . enalapril (VASOTEC) 20 MG tablet TAKE 1 TABLET (20 MG TOTAL) BY MOUTH 2 (TWO) TIMES DAILY. 180 tablet 2  . glipiZIDE (GLUCOTROL) 5 MG tablet Take 1 tablet (5 mg total) by mouth as directed. Take 10mg  in am and 5mg  in pm 270 tablet 3  . glucose blood (FREESTYLE LITE) test strip Use to check sugar once daily and as needed. Dx:E11.65 100 each 3  . metFORMIN (GLUCOPHAGE XR) 500 MG 24 hr tablet Take one tablet in the AM and two in the PM with meals 270 tablet 3  . OVER THE COUNTER MEDICATION Take 1 capsule by mouth 2 (two) times daily. VISION SUPPLEMENT    . triamcinolone cream (KENALOG) 0.1 % Apply 1 application topically 2 (two) times daily. Apply to AA. 30 g 0  . trimethoprim-polymyxin b (POLYTRIM) ophthalmic solution Place 1 drop into both eyes every 4 (four) hours. 10 mL 0  . colesevelam (WELCHOL) 625 MG tablet Take 1 tablet (625 mg total) by mouth 2 (two) times daily with a meal. (Patient not taking: Reported on 12/27/2015) 60 tablet 3  . mupirocin cream (BACTROBAN) 2 % Apply 1 application topically 2 (two) times daily. To nares (Patient not taking: Reported on 05/04/2016) 15 g 1  . oseltamivir (TAMIFLU) 75 MG capsule Take 1 capsule (75 mg total)  by mouth 2 (two) times daily. 10 capsule 0   No facility-administered medications prior to visit.      Per HPI unless specifically indicated in ROS section below Review of Systems     Objective:    BP 124/82   Pulse 70   Temp 98.2 F (36.8 C) (Oral)   Wt 168 lb (76.2 kg)   SpO2 99%   BMI 24.81 kg/m   Wt Readings from Last 3 Encounters:  09/03/16 168 lb (76.2 kg)  05/04/16 173 lb (78.5 kg)  04/29/16 173 lb 4 oz (78.6 kg)    Physical Exam  Constitutional: She appears well-developed and well-nourished. No distress.  HENT:  Head: Normocephalic and atraumatic.  Right Ear: External ear normal.  Left Ear:  External ear normal.  Nose: Nose normal.  Mouth/Throat: Oropharynx is clear and moist. No oropharyngeal exudate.  Eyes: Pupils are equal, round, and reactive to light. Conjunctivae and EOM are normal. No scleral icterus.  Neck: Normal range of motion. Neck supple.  Cardiovascular: Normal rate, regular rhythm, normal heart sounds and intact distal pulses.   No murmur heard. Pulmonary/Chest: Effort normal and breath sounds normal. No respiratory distress. She has no wheezes. She has no rales.  Musculoskeletal: She exhibits no edema.  See HPI for foot exam if done  Lymphadenopathy:    She has no cervical adenopathy.  Skin: Skin is warm and dry. No rash noted.  Psychiatric: She has a normal mood and affect.  Nursing note and vitals reviewed.  Results for orders placed or performed in visit on 39/76/73  Basic metabolic panel  Result Value Ref Range   Sodium 139 135 - 145 mEq/L   Potassium 4.1 3.5 - 5.1 mEq/L   Chloride 103 96 - 112 mEq/L   CO2 29 19 - 32 mEq/L   Glucose, Bld 195 (H) 70 - 99 mg/dL   BUN 14 6 - 23 mg/dL   Creatinine, Ser 0.70 0.40 - 1.20 mg/dL   Calcium 9.3 8.4 - 10.5 mg/dL   GFR 88.81 >60.00 mL/min  Hemoglobin A1c  Result Value Ref Range   Hgb A1c MFr Bld 7.9 (H) 4.6 - 6.5 %  Lipid panel  Result Value Ref Range   Cholesterol 161 0 - 200 mg/dL   Triglycerides 143.0 0.0 - 149.0 mg/dL   HDL 39.50 >39.00 mg/dL   VLDL 28.6 0.0 - 40.0 mg/dL   LDL Cholesterol 93 0 - 99 mg/dL   Total CHOL/HDL Ratio 4    NonHDL 121.68       Assessment & Plan:  Over 25 minutes were spent face-to-face with the patient during this encounter and >50% of that time was spent on counseling and coordination of care  Problem List Items Addressed This Visit    Diabetes mellitus type 2, uncontrolled ([place]) - Primary    Chronic. Discussed goal A1c <6.5%.  Reviewed sugar log pt brings. Discussed options, reviewed formulary for her insurance.  Pt opts to try once weekly trulicity, aware of slight  risk of endocrine neoplasia. Discussed administration of depo formulation. Pt will ask pharmacy for teaching when she picks up.  RTC 3 mo f/u visit, I asked her to update via MyChart effect after 3-4 wks.       Relevant Medications   Dulaglutide (TRULICITY) 4.19 FX/9.0WI SOPN   Essential hypertension, benign    Chronic, stable. Continue current regimen.       HLD (hyperlipidemia)    Levels improved - anticipate due to welchol although pt  has now stopped. Encouraged restart at least once daily MWF.       Lymphadenopathy of head and neck    Found to be benign firm parotid tissue s/p eval by ENT. Planned monitoring.           Follow up plan: Return in about 3 months (around 12/04/2016) for follow up visit.  Ria Bush, MD

## 2016-09-03 NOTE — Assessment & Plan Note (Signed)
Found to be benign firm parotid tissue s/p eval by ENT. Planned monitoring.

## 2016-09-03 NOTE — Assessment & Plan Note (Signed)
Chronic, stable. Continue current regimen. 

## 2016-09-03 NOTE — Patient Instructions (Addendum)
Cholesterol levels are better - consider welchol once daily.  Trial trulicity - sent to pharmacy.  Check with pharmacy about trulicity administration.  Return in 3 months for follow up, update me via mychart after 2-3 weeks.

## 2016-09-03 NOTE — Assessment & Plan Note (Signed)
Levels improved - anticipate due to welchol although pt has now stopped. Encouraged restart at least once daily MWF.

## 2016-09-03 NOTE — Assessment & Plan Note (Signed)
Chronic. Discussed goal A1c <6.5%.  Reviewed sugar log pt brings. Discussed options, reviewed formulary for her insurance.  Pt opts to try once weekly trulicity, aware of slight risk of endocrine neoplasia. Discussed administration of depo formulation. Pt will ask pharmacy for teaching when she picks up.  RTC 3 mo f/u visit, I asked her to update via MyChart effect after 3-4 wks.

## 2016-09-28 ENCOUNTER — Encounter: Payer: Self-pay | Admitting: Family Medicine

## 2016-10-06 ENCOUNTER — Telehealth: Payer: Self-pay | Admitting: Family Medicine

## 2016-10-06 NOTE — Telephone Encounter (Signed)
Pt's husband dropped off form to be signed. Placed in [place] tower

## 2016-10-08 ENCOUNTER — Encounter: Payer: Self-pay | Admitting: Family Medicine

## 2016-10-08 NOTE — Telephone Encounter (Signed)
Form signed and in CMA box.

## 2016-10-08 NOTE — Telephone Encounter (Signed)
Spoke to pt and advised forms were available; faxed as requested

## 2016-10-12 MED ORDER — INSULIN GLARGINE 100 UNIT/ML SOLOSTAR PEN: 5.0000 [IU] | PEN_INJECTOR | Freq: Every day | SUBCUTANEOUS | 6 refills | Status: DC

## 2016-10-19 ENCOUNTER — Telehealth: Payer: Self-pay

## 2016-10-19 MED ORDER — PEN NEEDLES 32G X 6 MM MISC: 5.0000 [IU] | Freq: Every day | 1 refills | Status: DC

## 2016-10-19 NOTE — Telephone Encounter (Signed)
Pt left v/m requesting pen needles to [facility]. Spoke with [place] at [employer] and she suggested 32 G 6 mm size. Done per protocol. Pt voiced understanding.

## 2016-10-20 ENCOUNTER — Encounter: Payer: Self-pay | Admitting: Family Medicine

## 2016-10-21 NOTE — Telephone Encounter (Signed)
Can you check into this?  healthteam advantage denied lab handling fee? I don't see where a lab specimen needed to be handled for this blood draw?  Will cc Vaughan Basta and Terri.

## 2016-10-26 ENCOUNTER — Encounter: Payer: Self-pay | Admitting: Family Medicine

## 2016-12-04 ENCOUNTER — Telehealth: Payer: Self-pay | Admitting: Family Medicine

## 2016-12-04 NOTE — Telephone Encounter (Signed)
Copied from [place] #[id number]. Topic: Quick Communication - See Telephone Encounter >> Dec 04, 2016  8:34 AM Ether Griffins B wrote: CRM for notification. See Telephone encounter for:  12/04/16.  Pt has an appt on the 30th for diabetes follow up and is wanting to know if she needs to come in for labs before her appt.

## 2016-12-07 NOTE — Telephone Encounter (Signed)
We can just do labs at f/u visit tomorrow.

## 2016-12-07 NOTE — Telephone Encounter (Signed)
Left message on vm per dpr relaying message per Dr. G.  

## 2016-12-08 ENCOUNTER — Ambulatory Visit (INDEPENDENT_AMBULATORY_CARE_PROVIDER_SITE_OTHER): Payer: PPO | Admitting: Family Medicine

## 2016-12-08 ENCOUNTER — Encounter: Payer: Self-pay | Admitting: Family Medicine

## 2016-12-08 VITALS — BP 122/80 | HR 71 | Temp 98.0°F | Wt 168.0 lb

## 2016-12-08 DIAGNOSIS — E782 Mixed hyperlipidemia: Secondary | ICD-10-CM | POA: Diagnosis not present

## 2016-12-08 DIAGNOSIS — E1165 Type 2 diabetes mellitus with hyperglycemia: Secondary | ICD-10-CM

## 2016-12-08 LAB — LIPID PANEL
CHOL/HDL RATIO: 4
Cholesterol: 200 mg/dL (ref 0–200)
HDL: 45.3 mg/dL (ref >39.00)
LDL CALC: 123 mg/dL — AB (ref 0–99)
NONHDL: 155.17
TRIGLYCERIDES: 161 mg/dL — AB (ref 0.0–149.0)
VLDL: 32.2 mg/dL (ref 0.0–40.0)

## 2016-12-08 LAB — HEMOGLOBIN A1C: Hgb A1c MFr Bld: 7.1 % — ABNORMAL HIGH (ref 4.6–6.5)

## 2016-12-08 MED ORDER — COLESEVELAM HCL 625 MG PO TABS: 625.0000 mg | ORAL_TABLET | Freq: Two times a day (BID) | ORAL | 3 refills | Status: DC

## 2016-12-08 NOTE — Addendum Note (Signed)
Addended by: Ria Bush on: 12/08/2016 10:19 AM   Modules accepted: Orders

## 2016-12-08 NOTE — Progress Notes (Signed)
BP 122/80 (BP Location: Left Arm, Patient Position: Sitting, Cuff Size: Normal)   Pulse 71   Temp 98 F (36.7 C) (Oral)   Wt 168 lb (76.2 kg)   SpO2 97%   BMI 24.81 kg/m    CC: 3 mo f/u visit Subjective:    Patient ID: Elizabeth Curtis, female    DOB: 05-12-1950, 66 y.o.   MRN: [id number]  HPI: Elizabeth Curtis is a 66 y.o. female presenting on 12/08/2016 for 3 mo follow-up   See prior note for details.   DM - last visit we started once weekly trulicity - pt felt this was not effective. So we started lantus 5u daily, titrating up to 8 units at night. She also continues metformin XR 500/1000mg  and glipizide 10/5 - she has changed this to 5mg  in am and 10mg  in pm with improvement in fasting sugar control. She brings log of sugars over the past month - fasting over the last week averaging 150s.   HLD - intolerant to statins. Recent commencement of welchol. She just restarted taking a week ago.   Relevant past medical, surgical, family and social history reviewed and updated as indicated. Interim medical history since our last visit reviewed. Allergies and medications reviewed and updated. Outpatient Medications Prior to Visit  Medication Sig Dispense Refill  . amLODipine (NORVASC) 5 MG tablet TAKE 1 TABLET (5 MG TOTAL) BY MOUTH AT BEDTIME. 90 tablet 2  . Calcium Carbonate-Vitamin D3 (CALCIUM 600+D3) 600-400 MG-UNIT TABS Take 1 tablet by mouth daily.    . colesevelam (WELCHOL) 625 MG tablet Take 1 tablet (625 mg total) by mouth 2 (two) times daily with a meal. (Patient not taking: Reported on 12/27/2015) 60 tablet 3  . enalapril (VASOTEC) 20 MG tablet TAKE 1 TABLET (20 MG TOTAL) BY MOUTH 2 (TWO) TIMES DAILY. 180 tablet 2  . glipiZIDE (GLUCOTROL) 5 MG tablet Take 1 tablet (5 mg total) by mouth as directed. Take 10mg  in am and 5mg  in pm 270 tablet 3  . glucose blood (FREESTYLE LITE) test strip Use to check sugar once daily and as needed. Dx:E11.65 100 each 3  . Insulin Glargine  (LANTUS SOLOSTAR) 100 UNIT/ML Solostar Pen Inject 5 Units into the skin daily. 1 pen 6  . Insulin Pen Needle (PEN NEEDLES) 32G X 6 MM MISC Inject 5 Units as directed daily. Use pen needles to give lantus solostar 100 each 1  . metFORMIN (GLUCOPHAGE XR) 500 MG 24 hr tablet Take one tablet in the AM and two in the PM with meals 270 tablet 3  . OVER THE COUNTER MEDICATION Take 1 capsule by mouth 2 (two) times daily. VISION SUPPLEMENT    . trimethoprim-polymyxin b (POLYTRIM) ophthalmic solution Place 1 drop into both eyes every 4 (four) hours. 10 mL 0   No facility-administered medications prior to visit.      Per HPI unless specifically indicated in ROS section below Review of Systems     Objective:    BP 122/80 (BP Location: Left Arm, Patient Position: Sitting, Cuff Size: Normal)   Pulse 71   Temp 98 F (36.7 C) (Oral)   Wt 168 lb (76.2 kg)   SpO2 97%   BMI 24.81 kg/m   Wt Readings from Last 3 Encounters:  12/08/16 168 lb (76.2 kg)  09/03/16 168 lb (76.2 kg)  05/04/16 173 lb (78.5 kg)    Physical Exam  Constitutional: She appears well-developed and well-nourished. No distress.  Nursing note and vitals reviewed.  Results for orders placed or performed in visit on 16/01/09  Basic metabolic panel  Result Value Ref Range   Sodium 139 135 - 145 mEq/L   Potassium 4.1 3.5 - 5.1 mEq/L   Chloride 103 96 - 112 mEq/L   CO2 29 19 - 32 mEq/L   Glucose, Bld 195 (H) 70 - 99 mg/dL   BUN 14 6 - 23 mg/dL   Creatinine, Ser 0.70 0.40 - 1.20 mg/dL   Calcium 9.3 8.4 - 10.5 mg/dL   GFR 88.81 >60.00 mL/min  Hemoglobin A1c  Result Value Ref Range   Hgb A1c MFr Bld 7.9 (H) 4.6 - 6.5 %  Lipid panel  Result Value Ref Range   Cholesterol 161 0 - 200 mg/dL   Triglycerides 143.0 0.0 - 149.0 mg/dL   HDL 39.50 >39.00 mg/dL   VLDL 28.6 0.0 - 40.0 mg/dL   LDL Cholesterol 93 0 - 99 mg/dL   Total CHOL/HDL Ratio 4    NonHDL 121.68       Assessment & Plan:   Problem List Items Addressed This Visit     Diabetes mellitus type 2, uncontrolled ([place]) - Primary    Chronic, improvement anticipated based on log of sugars she brings and A1c quick strips checked at [facility]. Update A1c.  rec decrease glipizide to 5mg  BID to avoid hypoglycemia, instead slowly titrate lantus for goal fasting sugars <150, limit for now to 10u daily.  I have asked her to send me sugar log in 1 month to monitor.  Continue metformin XR 500/1000mg .       Relevant Orders   Hemoglobin A1c   HLD (hyperlipidemia)    Intolerant to statins, has not been regular with welchol. Encouraged at least QOD dosing. Update FLP per pt request.       Relevant Orders   Lipid panel       Follow up plan: Return in about 5 months (around 05/08/2017) for annual exam, prior fasting for blood work.  Ria Bush, MD

## 2016-12-08 NOTE — Assessment & Plan Note (Signed)
Chronic, improvement anticipated based on log of sugars she brings and A1c quick strips checked at [facility]. Update A1c.  rec decrease glipizide to 5mg  BID to avoid hypoglycemia, instead slowly titrate lantus for goal fasting sugars <150, limit for now to 10u daily.  I have asked her to send me sugar log in 1 month to monitor.  Continue metformin XR 500/1000mg .

## 2016-12-08 NOTE — Assessment & Plan Note (Signed)
Intolerant to statins, has not been regular with welchol. Encouraged at least QOD dosing. Update FLP per pt request.

## 2016-12-08 NOTE — Patient Instructions (Addendum)
Decrease glipizide to 5mg  twice daily. May slowly titrate night time lantus as needed to 10 units. Let us know if morning sugars start creeping up.  I do encourage welchol use, ok to take every other day.  Labs today. Give me an update on sugar control in 4 weeks.  Return in 5 months for physical assuming sugars stay controlled.

## 2016-12-11 DIAGNOSIS — L570 Actinic keratosis: Secondary | ICD-10-CM | POA: Diagnosis not present

## 2016-12-11 DIAGNOSIS — D225 Melanocytic nevi of trunk: Secondary | ICD-10-CM | POA: Diagnosis not present

## 2016-12-11 DIAGNOSIS — D2261 Melanocytic nevi of right upper limb, including shoulder: Secondary | ICD-10-CM | POA: Diagnosis not present

## 2016-12-11 DIAGNOSIS — M71371 Other bursal cyst, right ankle and foot: Secondary | ICD-10-CM | POA: Diagnosis not present

## 2016-12-11 DIAGNOSIS — Z85820 Personal history of malignant melanoma of skin: Secondary | ICD-10-CM | POA: Diagnosis not present

## 2016-12-11 DIAGNOSIS — X32XXXA Exposure to sunlight, initial encounter: Secondary | ICD-10-CM | POA: Diagnosis not present

## 2017-01-07 ENCOUNTER — Encounter: Payer: Self-pay | Admitting: Family Medicine

## 2017-01-07 ENCOUNTER — Other Ambulatory Visit: Payer: Self-pay | Admitting: Family Medicine

## 2017-01-07 DIAGNOSIS — H538 Other visual disturbances: Secondary | ICD-10-CM

## 2017-01-07 DIAGNOSIS — Z9849 Cataract extraction status, unspecified eye: Secondary | ICD-10-CM

## 2017-01-08 NOTE — Telephone Encounter (Signed)
Pt requests referral to new ophtho. Having vision trouble.

## 2017-01-11 DIAGNOSIS — E119 Type 2 diabetes mellitus without complications: Secondary | ICD-10-CM | POA: Diagnosis not present

## 2017-01-31 ENCOUNTER — Other Ambulatory Visit: Payer: Self-pay | Admitting: Family Medicine

## 2017-02-08 ENCOUNTER — Other Ambulatory Visit: Payer: Self-pay | Admitting: Obstetrics & Gynecology

## 2017-02-08 DIAGNOSIS — Z1211 Encounter for screening for malignant neoplasm of colon: Secondary | ICD-10-CM | POA: Diagnosis not present

## 2017-02-08 DIAGNOSIS — Z01419 Encounter for gynecological examination (general) (routine) without abnormal findings: Secondary | ICD-10-CM | POA: Diagnosis not present

## 2017-02-08 DIAGNOSIS — Z1231 Encounter for screening mammogram for malignant neoplasm of breast: Secondary | ICD-10-CM

## 2017-02-17 DIAGNOSIS — H209 Unspecified iridocyclitis: Secondary | ICD-10-CM | POA: Diagnosis not present

## 2017-02-19 DIAGNOSIS — H209 Unspecified iridocyclitis: Secondary | ICD-10-CM | POA: Diagnosis not present

## 2017-02-23 DIAGNOSIS — H209 Unspecified iridocyclitis: Secondary | ICD-10-CM | POA: Diagnosis not present

## 2017-02-26 ENCOUNTER — Ambulatory Visit
Admission: RE | Admit: 2017-02-26 | Discharge: 2017-02-26 | Disposition: A | Discharge status: Home / Self Care | Payer: PPO | Source: Ambulatory Visit | Attending: Obstetrics & Gynecology | Admitting: Obstetrics & Gynecology

## 2017-02-26 DIAGNOSIS — Z1231 Encounter for screening mammogram for malignant neoplasm of breast: Secondary | ICD-10-CM | POA: Insufficient documentation

## 2017-02-26 IMAGING — MG MM DIGITAL SCREENING BILAT W/ TOMO W/ CAD
9 of 13 series · 9 of 29 positions shown · non-contrast
Comparison: Previous exam(s).

CLINICAL DATA: Screening.

EXAM:
2D DIGITAL SCREENING BILATERAL MAMMOGRAM WITH 3D TOMO WITH CAD

[L XCCL]
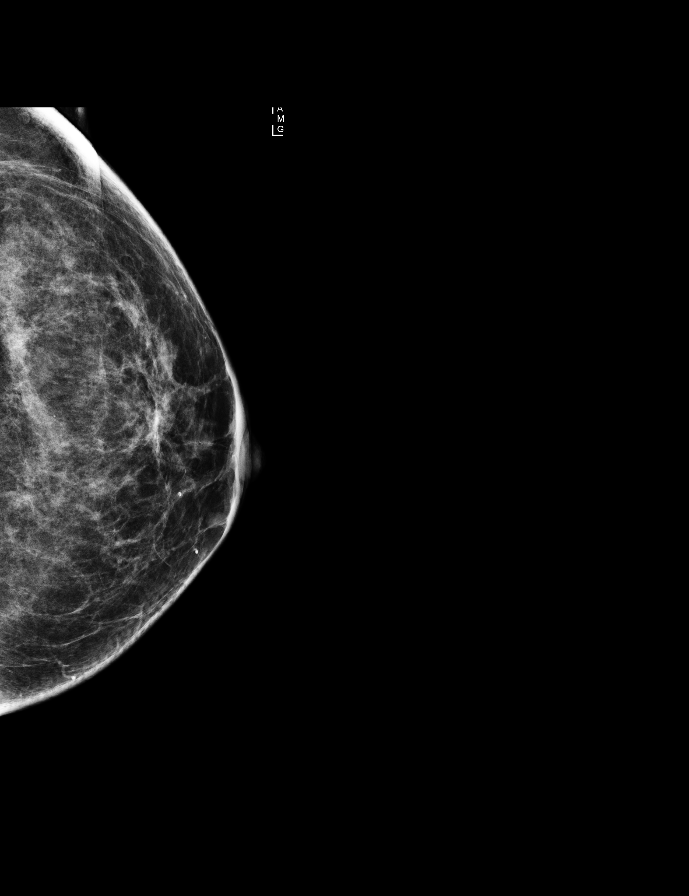

[R CC synth-2D]
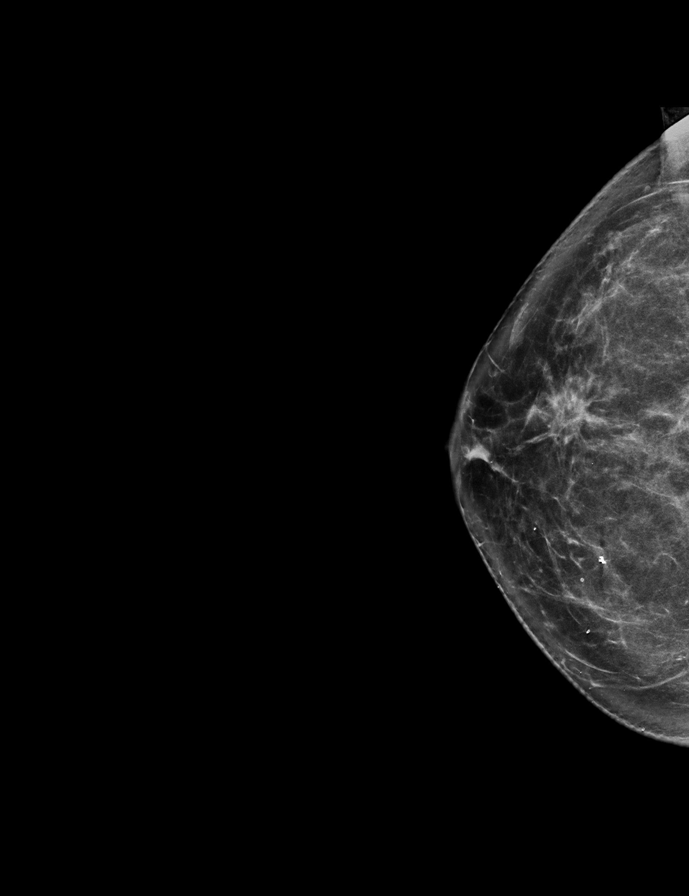

[R CC]
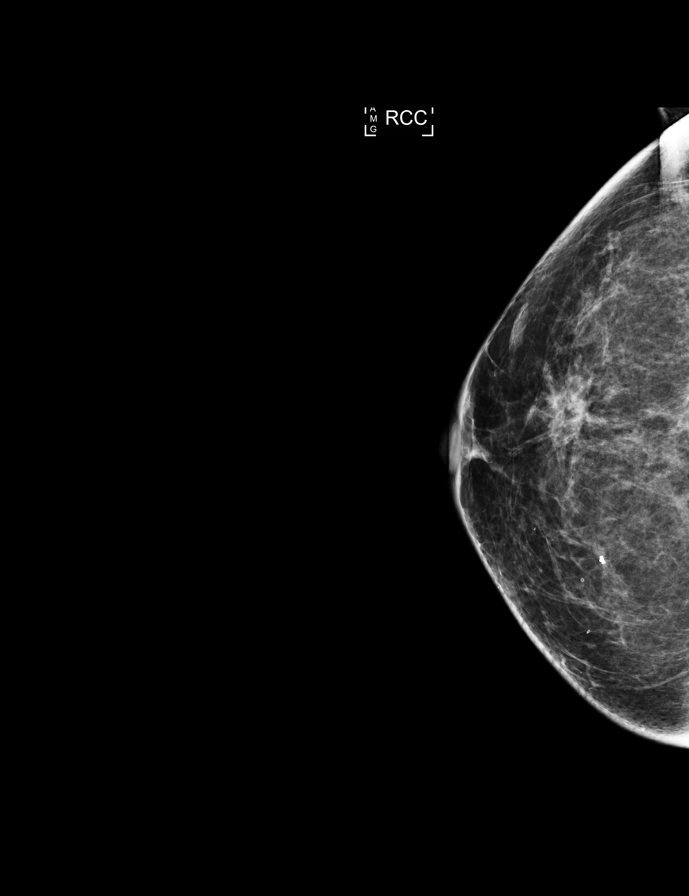

[R MLO synth-2D]
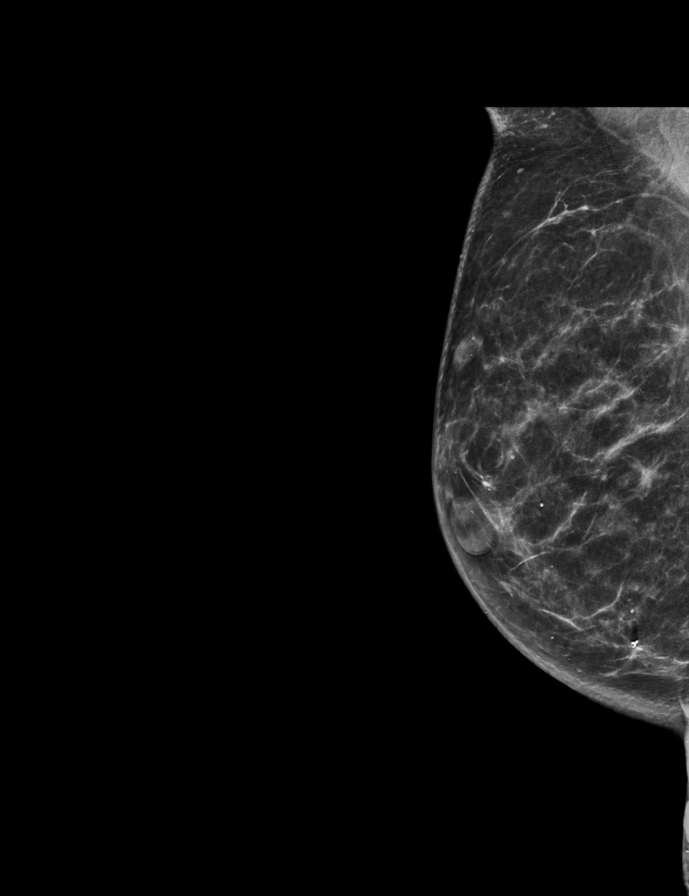

[L MLO]
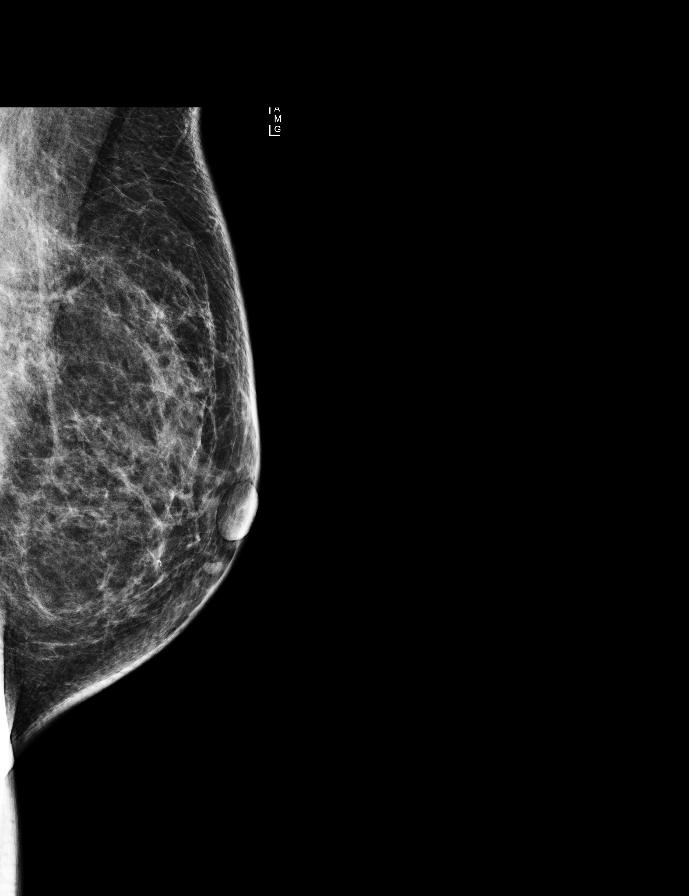

[L MLO synth-2D]
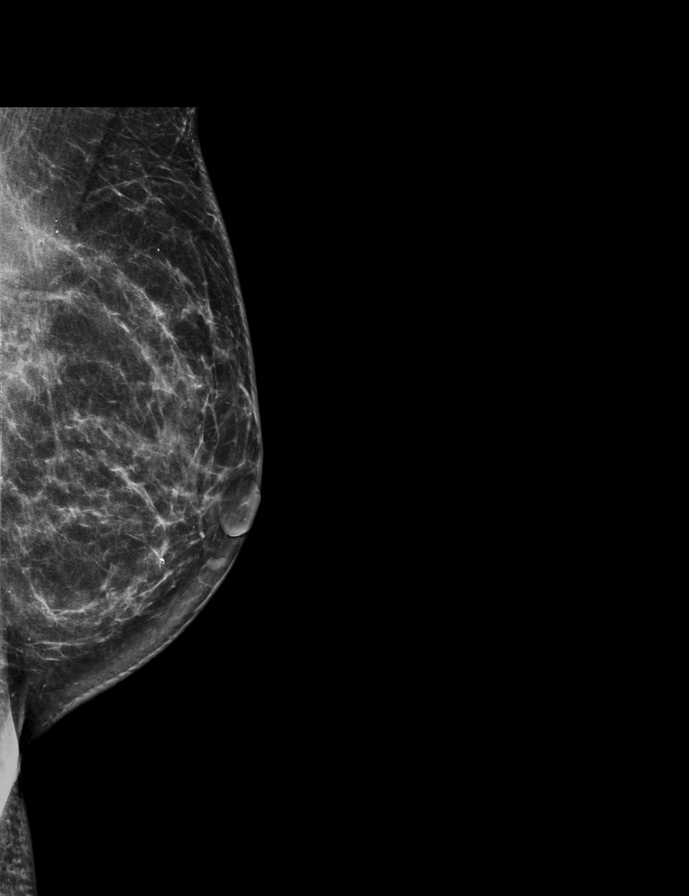

[R MLO]
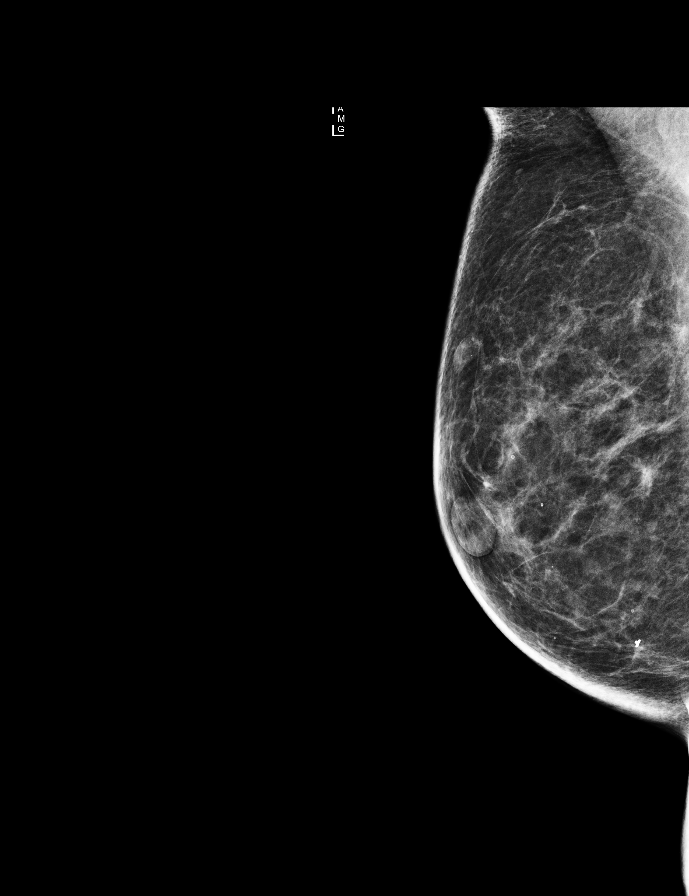

[L CC]
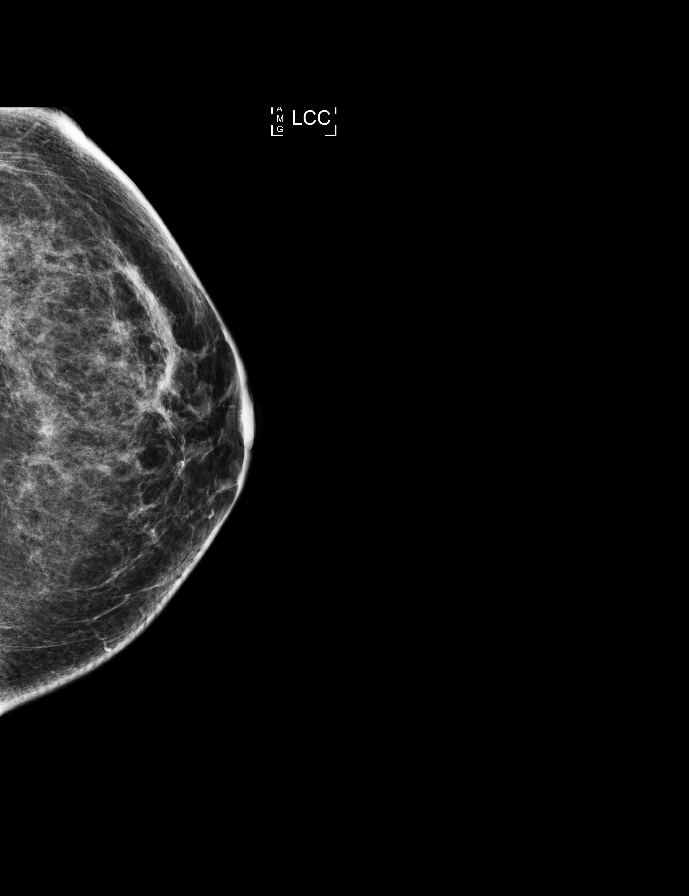

[L CC synth-2D]
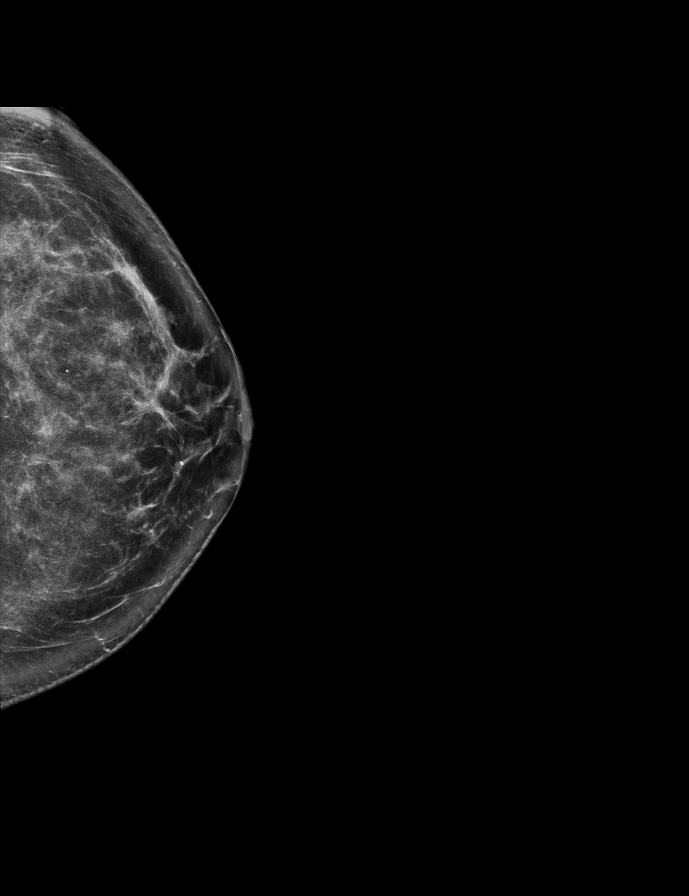

[9 of 29 positions shown; findings below may reference images not displayed]

ACR Breast Density Category b: There are scattered areas of
fibroglandular density.
FINDINGS: There are no findings suspicious for malignancy. Images were
processed with CAD.
IMPRESSION: No mammographic evidence of malignancy. A result letter of this
screening mammogram will be mailed directly to the patient.

RECOMMENDATION:
Screening mammogram in one year. (Code:[4P])

BI-RADS CATEGORY  1: Negative.

## 2017-03-02 ENCOUNTER — Other Ambulatory Visit: Payer: Self-pay | Admitting: Family Medicine

## 2017-03-05 ENCOUNTER — Telehealth: Payer: Self-pay

## 2017-03-05 MED ORDER — INSULIN GLARGINE 100 UNIT/ML SOLOSTAR PEN: PEN_INJECTOR | SUBCUTANEOUS | 0 refills | Status: DC

## 2017-03-05 NOTE — Telephone Encounter (Signed)
Dolly at [facility] said needs 5 pens instead of 1 pen. Cannot break box.done.

## 2017-03-08 DIAGNOSIS — T8579XA Infection and inflammatory reaction due to other internal prosthetic devices, implants and grafts, initial encounter: Secondary | ICD-10-CM | POA: Diagnosis not present

## 2017-04-05 DIAGNOSIS — H209 Unspecified iridocyclitis: Secondary | ICD-10-CM | POA: Diagnosis not present

## 2017-04-08 ENCOUNTER — Other Ambulatory Visit: Payer: Self-pay | Admitting: Family Medicine

## 2017-04-08 ENCOUNTER — Encounter: Payer: Self-pay | Admitting: Family Medicine

## 2017-04-08 DIAGNOSIS — H4040X0 Glaucoma secondary to eye inflammation, unspecified eye, stage unspecified: Secondary | ICD-10-CM

## 2017-04-08 DIAGNOSIS — T8579XA Infection and inflammatory reaction due to other internal prosthetic devices, implants and grafts, initial encounter: Principal | ICD-10-CM

## 2017-04-09 DIAGNOSIS — T8579XA Infection and inflammatory reaction due to other internal prosthetic devices, implants and grafts, initial encounter: Secondary | ICD-10-CM

## 2017-04-09 DIAGNOSIS — H209 Unspecified iridocyclitis: Secondary | ICD-10-CM | POA: Insufficient documentation

## 2017-04-09 DIAGNOSIS — H4042X0 Glaucoma secondary to eye inflammation, left eye, stage unspecified: Secondary | ICD-10-CM | POA: Insufficient documentation

## 2017-04-09 DIAGNOSIS — T85398A Other mechanical complication of other ocular prosthetic devices, implants and grafts, initial encounter: Secondary | ICD-10-CM | POA: Insufficient documentation

## 2017-04-09 DIAGNOSIS — H4040X0 Glaucoma secondary to eye inflammation, unspecified eye, stage unspecified: Secondary | ICD-10-CM

## 2017-04-09 HISTORY — DX: Other mechanical complication of other ocular prosthetic devices, implants and grafts, initial encounter: T85.398A

## 2017-04-09 HISTORY — DX: Glaucoma secondary to eye inflammation, unspecified eye, stage unspecified: H40.40X0

## 2017-04-09 LAB — HM DIABETES EYE EXAM

## 2017-04-11 ENCOUNTER — Encounter: Payer: Self-pay | Admitting: Family Medicine

## 2017-04-14 ENCOUNTER — Other Ambulatory Visit: Payer: Self-pay | Admitting: Family Medicine

## 2017-04-28 DIAGNOSIS — T8579XA Infection and inflammatory reaction due to other internal prosthetic devices, implants and grafts, initial encounter: Secondary | ICD-10-CM | POA: Diagnosis not present

## 2017-04-28 DIAGNOSIS — Z9889 Other specified postprocedural states: Secondary | ICD-10-CM | POA: Diagnosis not present

## 2017-04-28 DIAGNOSIS — H5231 Anisometropia: Secondary | ICD-10-CM | POA: Diagnosis not present

## 2017-05-07 ENCOUNTER — Encounter: Payer: PPO | Admitting: Family Medicine

## 2017-05-07 ENCOUNTER — Ambulatory Visit: Payer: PPO

## 2017-05-10 ENCOUNTER — Encounter: Payer: PPO | Admitting: Family Medicine

## 2017-05-10 HISTORY — PX: INTRAOCULAR LENS EXCHANGE: SHX1841

## 2017-05-13 ENCOUNTER — Encounter: Payer: Self-pay | Admitting: Family Medicine

## 2017-06-01 ENCOUNTER — Other Ambulatory Visit: Payer: Self-pay | Admitting: Family Medicine

## 2017-06-03 DIAGNOSIS — H4042X0 Glaucoma secondary to eye inflammation, left eye, stage unspecified: Secondary | ICD-10-CM | POA: Diagnosis not present

## 2017-06-03 DIAGNOSIS — T8579XA Infection and inflammatory reaction due to other internal prosthetic devices, implants and grafts, initial encounter: Secondary | ICD-10-CM | POA: Diagnosis not present

## 2017-06-03 DIAGNOSIS — H5989 Other postprocedural complications and disorders of eye and adnexa, not elsewhere classified: Secondary | ICD-10-CM | POA: Diagnosis not present

## 2017-06-03 DIAGNOSIS — Z79899 Other long term (current) drug therapy: Secondary | ICD-10-CM | POA: Diagnosis not present

## 2017-06-03 DIAGNOSIS — E119 Type 2 diabetes mellitus without complications: Secondary | ICD-10-CM | POA: Diagnosis not present

## 2017-06-03 DIAGNOSIS — Z794 Long term (current) use of insulin: Secondary | ICD-10-CM | POA: Diagnosis not present

## 2017-06-03 DIAGNOSIS — T85698A Other mechanical complication of other specified internal prosthetic devices, implants and grafts, initial encounter: Secondary | ICD-10-CM | POA: Diagnosis not present

## 2017-06-04 ENCOUNTER — Encounter: Payer: Self-pay | Admitting: Family Medicine

## 2017-06-17 ENCOUNTER — Other Ambulatory Visit: Payer: Self-pay | Admitting: Family Medicine

## 2017-06-17 DIAGNOSIS — R946 Abnormal results of thyroid function studies: Secondary | ICD-10-CM

## 2017-06-17 DIAGNOSIS — E1165 Type 2 diabetes mellitus with hyperglycemia: Secondary | ICD-10-CM

## 2017-06-17 DIAGNOSIS — E782 Mixed hyperlipidemia: Secondary | ICD-10-CM

## 2017-06-18 ENCOUNTER — Ambulatory Visit (INDEPENDENT_AMBULATORY_CARE_PROVIDER_SITE_OTHER): Payer: PPO

## 2017-06-18 ENCOUNTER — Other Ambulatory Visit: Payer: PPO

## 2017-06-18 DIAGNOSIS — R946 Abnormal results of thyroid function studies: Secondary | ICD-10-CM

## 2017-06-18 DIAGNOSIS — E1165 Type 2 diabetes mellitus with hyperglycemia: Secondary | ICD-10-CM

## 2017-06-18 DIAGNOSIS — E782 Mixed hyperlipidemia: Secondary | ICD-10-CM

## 2017-06-18 LAB — LIPID PANEL
CHOLESTEROL: 177 mg/dL (ref 0–200)
HDL: 39.4 mg/dL (ref >39.00)
LDL CALC: 112 mg/dL — AB (ref 0–99)
NonHDL: 137.71
TRIGLYCERIDES: 131 mg/dL (ref 0.0–149.0)
Total CHOL/HDL Ratio: 4
VLDL: 26.2 mg/dL (ref 0.0–40.0)

## 2017-06-18 LAB — COMPREHENSIVE METABOLIC PANEL
ALT: 12 U/L (ref 0–35)
AST: 11 U/L (ref 0–37)
Albumin: 4.3 g/dL (ref 3.5–5.2)
Alkaline Phosphatase: 50 U/L (ref 39–117)
BUN: 13 mg/dL (ref 6–23)
CALCIUM: 9.4 mg/dL (ref 8.4–10.5)
CHLORIDE: 104 meq/L (ref 96–112)
CO2: 31 mEq/L (ref 19–32)
CREATININE: 0.64 mg/dL (ref 0.40–1.20)
GFR: 98.25 mL/min (ref >60.00)
Glucose, Bld: 168 mg/dL — ABNORMAL HIGH (ref 70–99)
Potassium: 4.1 mEq/L (ref 3.5–5.1)
Sodium: 142 mEq/L (ref 135–145)
TOTAL PROTEIN: 6.9 g/dL (ref 6.0–8.3)
Total Bilirubin: 0.6 mg/dL (ref 0.2–1.2)

## 2017-06-18 LAB — TSH: TSH: 0.61 u[IU]/mL (ref 0.35–4.50)

## 2017-06-18 LAB — T4, FREE: FREE T4: 0.97 ng/dL (ref 0.60–1.60)

## 2017-06-18 LAB — HEMOGLOBIN A1C: Hgb A1c MFr Bld: 7.8 % — ABNORMAL HIGH (ref 4.6–6.5)

## 2017-06-18 NOTE — Progress Notes (Signed)
AWV was not completed. Labs only.

## 2017-06-23 ENCOUNTER — Ambulatory Visit: Payer: PPO

## 2017-06-30 ENCOUNTER — Encounter: Payer: Self-pay | Admitting: Family Medicine

## 2017-06-30 ENCOUNTER — Ambulatory Visit (INDEPENDENT_AMBULATORY_CARE_PROVIDER_SITE_OTHER): Payer: PPO | Admitting: Family Medicine

## 2017-06-30 VITALS — BP 126/82 | HR 79 | Temp 98.4°F | Ht 68.5 in | Wt 170.0 lb

## 2017-06-30 DIAGNOSIS — E1165 Type 2 diabetes mellitus with hyperglycemia: Secondary | ICD-10-CM

## 2017-06-30 DIAGNOSIS — E782 Mixed hyperlipidemia: Secondary | ICD-10-CM

## 2017-06-30 DIAGNOSIS — H4040X0 Glaucoma secondary to eye inflammation, unspecified eye, stage unspecified: Secondary | ICD-10-CM

## 2017-06-30 DIAGNOSIS — I10 Essential (primary) hypertension: Secondary | ICD-10-CM

## 2017-06-30 DIAGNOSIS — Z7189 Other specified counseling: Secondary | ICD-10-CM

## 2017-06-30 DIAGNOSIS — Z85820 Personal history of malignant melanoma of skin: Secondary | ICD-10-CM

## 2017-06-30 DIAGNOSIS — Z0000 Encounter for general adult medical examination without abnormal findings: Secondary | ICD-10-CM | POA: Diagnosis not present

## 2017-06-30 DIAGNOSIS — R946 Abnormal results of thyroid function studies: Secondary | ICD-10-CM

## 2017-06-30 DIAGNOSIS — T8579XD Infection and inflammatory reaction due to other internal prosthetic devices, implants and grafts, subsequent encounter: Secondary | ICD-10-CM

## 2017-06-30 DIAGNOSIS — M8580 Other specified disorders of bone density and structure, unspecified site: Secondary | ICD-10-CM

## 2017-06-30 MED ORDER — GLIPIZIDE 5 MG PO TABS: 5.0000 mg | ORAL_TABLET | Freq: Two times a day (BID) | ORAL | 3 refills | Status: DC

## 2017-06-30 MED ORDER — COLESEVELAM HCL 3.75 G PO PACK: 3.7500 g | PACK | Freq: Every day | ORAL | 11 refills | Status: DC

## 2017-06-30 MED ORDER — INSULIN PEN NEEDLE 32G X 6 MM MISC: 3 refills | Status: DC

## 2017-06-30 MED ORDER — AMLODIPINE BESYLATE 5 MG PO TABS: 5.0000 mg | ORAL_TABLET | Freq: Every day | ORAL | 3 refills | Status: DC

## 2017-06-30 MED ORDER — INSULIN GLARGINE 100 UNIT/ML SOLOSTAR PEN: 15.0000 [IU] | PEN_INJECTOR | Freq: Every day | SUBCUTANEOUS | 3 refills | Status: DC

## 2017-06-30 MED ORDER — ENALAPRIL MALEATE 20 MG PO TABS: 20.0000 mg | ORAL_TABLET | Freq: Two times a day (BID) | ORAL | 3 refills | Status: DC

## 2017-06-30 NOTE — Patient Instructions (Addendum)
If interested, check with pharmacy about new 2 shot shingles series (shingrix).  You are doing well today Slowly titrate lantus to 15 units, by 1 unit every few days if fasting averaging >150.  Restart colesevelam - will try powder form.  Return as needed or in 6 months for diabetes follow up visit.  Health Maintenance, Female Adopting a healthy lifestyle and getting preventive care can go a long way to promote health and wellness. Talk with your health care provider about what schedule of regular examinations is right for you. This is a good chance for you to check in with your provider about disease prevention and staying healthy. In between checkups, there are plenty of things you can do on your own. Experts have done a lot of research about which lifestyle changes and preventive measures are most likely to keep you healthy. Ask your health care provider for more information. Weight and diet Eat a healthy diet  Be sure to include plenty of vegetables, fruits, low-fat dairy products, and lean protein.  Do not eat a lot of foods high in solid fats, added sugars, or salt.  Get regular exercise. This is one of the most important things you can do for your health. ? Most adults should exercise for at least 150 minutes each week. The exercise should increase your heart rate and make you sweat (moderate-intensity exercise). ? Most adults should also do strengthening exercises at least twice a week. This is in addition to the moderate-intensity exercise.  Maintain a healthy weight  Body mass index (BMI) is a measurement that can be used to identify possible weight problems. It estimates body fat based on height and weight. Your health care provider can help determine your BMI and help you achieve or maintain a healthy weight.  For females 86 years of age and older: ? A BMI below 18.5 is considered underweight. ? A BMI of 18.5 to 24.9 is normal. ? A BMI of 25 to 29.9 is considered  overweight. ? A BMI of 30 and above is considered obese.  Watch levels of cholesterol and blood lipids  You should start having your blood tested for lipids and cholesterol at 68 years of age, then have this test every 5 years.  You may need to have your cholesterol levels checked more often if: ? Your lipid or cholesterol levels are high. ? You are older than 68 years of age. ? You are at high risk for heart disease.  Cancer screening Lung Cancer  Lung cancer screening is recommended for adults 36-79 years old who are at high risk for lung cancer because of a history of smoking.  A yearly low-dose CT scan of the lungs is recommended for people who: ? Currently smoke. ? Have quit within the past 15 years. ? Have at least a 30-pack-year history of smoking. A pack year is smoking an average of one pack of cigarettes a day for 1 year.  Yearly screening should continue until it has been 15 years since you quit.  Yearly screening should stop if you develop a health problem that would prevent you from having lung cancer treatment.  Breast Cancer  Practice breast self-awareness. This means understanding how your breasts normally appear and feel.  It also means doing regular breast self-exams. Let your health care provider know about any changes, no matter how small.  If you are in your 20s or 30s, you should have a clinical breast exam (CBE) by a health care provider every  1-3 years as part of a regular health exam.  If you are 23 or older, have a CBE every year. Also consider having a breast X-ray (mammogram) every year.  If you have a family history of breast cancer, talk to your health care provider about genetic screening.  If you are at high risk for breast cancer, talk to your health care provider about having an MRI and a mammogram every year.  Breast cancer gene (BRCA) assessment is recommended for women who have family members with BRCA-related cancers. BRCA-related cancers  include: ? Breast. ? Ovarian. ? Tubal. ? Peritoneal cancers.  Results of the assessment will determine the need for genetic counseling and BRCA1 and BRCA2 testing.  Cervical Cancer Your health care provider may recommend that you be screened regularly for cancer of the pelvic organs (ovaries, uterus, and vagina). This screening involves a pelvic examination, including checking for microscopic changes to the surface of your cervix (Pap test). You may be encouraged to have this screening done every 3 years, beginning at age 71.  For women ages 51-65, health care providers may recommend pelvic exams and Pap testing every 3 years, or they may recommend the Pap and pelvic exam, combined with testing for human papilloma virus (HPV), every 5 years. Some types of HPV increase your risk of cervical cancer. Testing for HPV may also be done on women of any age with unclear Pap test results.  Other health care providers may not recommend any screening for nonpregnant women who are considered low risk for pelvic cancer and who do not have symptoms. Ask your health care provider if a screening pelvic exam is right for you.  If you have had past treatment for cervical cancer or a condition that could lead to cancer, you need Pap tests and screening for cancer for at least 20 years after your treatment. If Pap tests have been discontinued, your risk factors (such as having a new sexual partner) need to be reassessed to determine if screening should resume. Some women have medical problems that increase the chance of getting cervical cancer. In these cases, your health care provider may recommend more frequent screening and Pap tests.  Colorectal Cancer  This type of cancer can be detected and often prevented.  Routine colorectal cancer screening usually begins at 68 years of age and continues through 68 years of age.  Your health care provider may recommend screening at an earlier age if you have risk factors  for colon cancer.  Your health care provider may also recommend using home test kits to check for hidden blood in the stool.  A small camera at the end of a tube can be used to examine your colon directly (sigmoidoscopy or colonoscopy). This is done to check for the earliest forms of colorectal cancer.  Routine screening usually begins at age 30.  Direct examination of the colon should be repeated every 5-10 years through 68 years of age. However, you may need to be screened more often if early forms of precancerous polyps or small growths are found.  Skin Cancer  Check your skin from head to toe regularly.  Tell your health care provider about any new moles or changes in moles, especially if there is a change in a mole's shape or color.  Also tell your health care provider if you have a mole that is larger than the size of a pencil eraser.  Always use sunscreen. Apply sunscreen liberally and repeatedly throughout the day.  Protect  yourself by wearing long sleeves, pants, a wide-brimmed hat, and sunglasses whenever you are outside.  Heart disease, diabetes, and high blood pressure  High blood pressure causes heart disease and increases the risk of stroke. High blood pressure is more likely to develop in: ? People who have blood pressure in the high end of the normal range (130-139/85-89 mm Hg). ? People who are overweight or obese. ? People who are African American.  If you are 76-29 years of age, have your blood pressure checked every 3-5 years. If you are 55 years of age or older, have your blood pressure checked every year. You should have your blood pressure measured twice-once when you are at a hospital or clinic, and once when you are not at a hospital or clinic. Record the average of the two measurements. To check your blood pressure when you are not at a hospital or clinic, you can use: ? An automated blood pressure machine at a pharmacy. ? A home blood pressure monitor.  If  you are between 64 years and 74 years old, ask your health care provider if you should take aspirin to prevent strokes.  Have regular diabetes screenings. This involves taking a blood sample to check your fasting blood sugar level. ? If you are at a normal weight and have a low risk for diabetes, have this test once every three years after 68 years of age. ? If you are overweight and have a high risk for diabetes, consider being tested at a younger age or more often. Preventing infection Hepatitis B  If you have a higher risk for hepatitis B, you should be screened for this virus. You are considered at high risk for hepatitis B if: ? You were born in a country where hepatitis B is common. Ask your health care provider which countries are considered high risk. ? Your parents were born in a high-risk country, and you have not been immunized against hepatitis B (hepatitis B vaccine). ? You have HIV or AIDS. ? You use needles to inject street drugs. ? You live with someone who has hepatitis B. ? You have had sex with someone who has hepatitis B. ? You get hemodialysis treatment. ? You take certain medicines for conditions, including cancer, organ transplantation, and autoimmune conditions.  Hepatitis C  Blood testing is recommended for: ? Everyone born from [birth year] through 1966. ? Anyone with known risk factors for hepatitis C.  Sexually transmitted infections (STIs)  You should be screened for sexually transmitted infections (STIs) including gonorrhea and chlamydia if: ? You are sexually active and are younger than 68 years of age. ? You are older than 68 years of age and your health care provider tells you that you are at risk for this type of infection. ? Your sexual activity has changed since you were last screened and you are at an increased risk for chlamydia or gonorrhea. Ask your health care provider if you are at risk.  If you do not have HIV, but are at risk, it may be recommended  that you take a prescription medicine daily to prevent HIV infection. This is called pre-exposure prophylaxis (PrEP). You are considered at risk if: ? You are sexually active and do not regularly use condoms or know the HIV status of your partner(s). ? You take drugs by injection. ? You are sexually active with a partner who has HIV.  Talk with your health care provider about whether you are at high risk of being  infected with HIV. If you choose to begin PrEP, you should first be tested for HIV. You should then be tested every 3 months for as long as you are taking PrEP. Pregnancy  If you are premenopausal and you may become pregnant, ask your health care provider about preconception counseling.  If you may become pregnant, take 400 to 800 micrograms (mcg) of folic acid every day.  If you want to prevent pregnancy, talk to your health care provider about birth control (contraception). Osteoporosis and menopause  Osteoporosis is a disease in which the bones lose minerals and strength with aging. This can result in serious bone fractures. Your risk for osteoporosis can be identified using a bone density scan.  If you are 68 years of age or older, or if you are at risk for osteoporosis and fractures, ask your health care provider if you should be screened.  Ask your health care provider whether you should take a calcium or vitamin D supplement to lower your risk for osteoporosis.  Menopause may have certain physical symptoms and risks.  Hormone replacement therapy may reduce some of these symptoms and risks. Talk to your health care provider about whether hormone replacement therapy is right for you. Follow these instructions at home:  Schedule regular health, dental, and eye exams.  Stay current with your immunizations.  Do not use any tobacco products including cigarettes, chewing tobacco, or electronic cigarettes.  If you are pregnant, do not drink alcohol.  If you are  breastfeeding, limit how much and how often you drink alcohol.  Limit alcohol intake to no more than 1 drink per day for nonpregnant women. One drink equals 12 ounces of beer, 5 ounces of wine, or 1 ounces of hard liquor.  Do not use street drugs.  Do not share needles.  Ask your health care provider for help if you need support or information about quitting drugs.  Tell your health care provider if you often feel depressed.  Tell your health care provider if you have ever been abused or do not feel safe at home. This information is not intended to replace advice given to you by your health care provider. Make sure you discuss any questions you have with your health care provider. Document Released: 08/11/2010 Document Revised: 07/04/2015 Document Reviewed: 10/30/2014 Elsevier Interactive Patient Education  [employer].

## 2017-06-30 NOTE — Assessment & Plan Note (Signed)
Chronic, improved but LDL still above goal. Intolerant to statins. Agrees to retrial welchol - will send in powder which seems better covered by insurance.  The 10-year ASCVD risk score Mikey Bussing DC Brooke Bonito., et al., 2013) is: 17.5%   Values used to calculate the score:     Age: 67 years     Sex: Female     Is Non-Hispanic African American: No     Diabetic: Yes     Tobacco smoker: No     Systolic Blood Pressure: 915 mmHg     Is BP treated: Yes     HDL Cholesterol: 39.4 mg/dL     Total Cholesterol: 177 mg/dL

## 2017-06-30 NOTE — Assessment & Plan Note (Signed)
Preventative protocols reviewed and updated unless pt declined. Discussed healthy diet and lifestyle.  

## 2017-06-30 NOTE — Assessment & Plan Note (Signed)
TFTs stable.  

## 2017-06-30 NOTE — Progress Notes (Signed)
BP 126/82 (BP Location: Left Arm, Patient Position: Sitting, Cuff Size: Large)   Pulse 79   Temp 98.4 F (36.9 C) (Oral)   Ht 5' 8.5" (1.74 m)   Wt 170 lb (77.1 kg)   SpO2 98%   BMI 25.47 kg/m    CC: AMW Subjective:    Patient ID: Elizabeth Curtis, female    DOB: 1950/03/24, 67 y.o.   MRN: [id number]  HPI: Elizabeth Curtis is a 67 y.o. female presenting on 06/30/2017 for Medicare Wellness   Did not see Katha Cabal this year. Helping care for mother who lives in [place] - with dementia.   Hearing Screening   125Hz  250Hz  500Hz  1000Hz  2000Hz  3000Hz  4000Hz  6000Hz  8000Hz   Right ear:   40 40 20  20    Left ear:   40 40 20  40    Vision Screening Comments: Last eye exam, 04/2017 [place] depression and fall screen.   Recent dx L eye uveitis glaucoma hyphema syndrome established with Dr Leanora Ivanoff at [facility]. She had intraocular lens replacement surgery.   Preventative: Colonoscopy - 2010 [place], 1 polyp, 80mm lipoma, o/w normal, rec rpt 10 yrs.  Well woman at [facility], Dr Leonides Schanz 01/2017. Pap normal 01/2015. rpt testing 3 yrs. LMP - 2010. Menopausal.  Mammogram - Birads1 02/2017  DEXA 03/27/15 Osteopenia - not regular with calcium or vit D, hasn't been walking regularly Flu shot yearly. Tetanus 2010.  Pneumovax 09/2011 (felt sick afterwards). Prevnar 02/2015. Due for pneumovax - declines today.  Shingrix - interested  Living will scanned into chart 2017. Husband Chrissie Noa then son Gerald Stabs are [place]. Does not want prolonged life support if terminally or incurably ill.  Seat belt use discussed Sunscreen use discussed, no changing skin lesions. H/o melanoma, sees Dr Evorn Gong derm yearly Non smoker Alcohol - rare Dentist Q6 months. 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  Activity: 2.5 mi most days walking Diet: good water, daily fruits/vegetables  Relevant past  medical, surgical, family and social history reviewed and updated as indicated. Interim medical history since our last visit reviewed. Allergies and medications reviewed and updated. Outpatient Medications Prior to Visit  Medication Sig Dispense Refill  . cholecalciferol (VITAMIN D) 1000 units tablet Take 1,000 Units by mouth daily.    Marland Kitchen glucose blood (FREESTYLE LITE) test strip Use to check sugar once daily and as needed. Dx:E11.65 100 each 3  . metFORMIN (GLUCOPHAGE-XR) 500 MG 24 hr tablet TAKE ONE TABLET IN THE AM AND TWO IN THE PM WITH MEALS 270 tablet 3  . amLODipine (NORVASC) 5 MG tablet TAKE 1 TABLET (5 MG TOTAL) BY MOUTH AT BEDTIME. 90 tablet 1  . BD PEN NEEDLE MICRO U/F 32G X 6 MM MISC INJECT 5 UNITS AS DIRECTED DAILY. USE PEN NEEDLES TO GIVE LANTUS SOLOSTAR 100 each 1  . Calcium Carbonate-Vitamin D3 (CALCIUM 600+D3) 600-400 MG-UNIT TABS Take 1 tablet by mouth daily.    . colesevelam (WELCHOL) 625 MG tablet Take 1 tablet (625 mg total) by mouth 2 (two) times daily with a meal. 60 tablet 3  . enalapril (VASOTEC) 20 MG tablet TAKE 1 TABLET (20 MG TOTAL) BY MOUTH 2 (TWO) TIMES DAILY. 180 tablet 1  . glipiZIDE (GLUCOTROL) 5 MG tablet Take 1 tablet (5 mg total) by mouth 2 (two) times daily before a meal.    . Insulin Glargine (LANTUS SOLOSTAR) 100 UNIT/ML Solostar Pen  INJECT 5 UNITS INTO THE SKIN DAILY. (Patient taking differently: INJECT 10 UNITS INTO THE SKIN DAILY.) 1 pen 1  . OVER THE COUNTER MEDICATION Take 1 capsule by mouth 2 (two) times daily. VISION SUPPLEMENT    . trimethoprim-polymyxin b (POLYTRIM) ophthalmic solution Place 1 drop into both eyes every 4 (four) hours. 10 mL 0   No facility-administered medications prior to visit.      Per HPI unless specifically indicated in ROS section below Review of Systems  Constitutional: Negative for activity change, appetite change, chills, fatigue, fever and unexpected weight change.  HENT: Negative for hearing loss.   Eyes: Negative for  visual disturbance.  Respiratory: Negative for cough, chest tightness, shortness of breath and wheezing.   Cardiovascular: Negative for chest pain, palpitations and leg swelling.  Gastrointestinal: Positive for diarrhea (occasional). Negative for abdominal distention, abdominal pain, blood in stool, constipation, nausea and vomiting.  Genitourinary: Negative for difficulty urinating and hematuria.  Musculoskeletal: Negative for arthralgias, myalgias and neck pain.  Skin: Negative for rash.  Neurological: Negative for dizziness, seizures, syncope and headaches.  Hematological: Negative for adenopathy. Does not bruise/bleed easily.  Psychiatric/Behavioral: Negative for dysphoric mood. The patient is not nervous/anxious.        Objective:    BP 126/82 (BP Location: Left Arm, Patient Position: Sitting, Cuff Size: Large)   Pulse 79   Temp 98.4 F (36.9 C) (Oral)   Ht 5' 8.5" (1.74 m)   Wt 170 lb (77.1 kg)   SpO2 98%   BMI 25.47 kg/m   Wt Readings from Last 3 Encounters:  06/30/17 170 lb (77.1 kg)  06/18/17 169 lb 12 oz (77 kg)  12/08/16 168 lb (76.2 kg)    Physical Exam  Constitutional: She is oriented to person, place, and time. She appears well-developed and well-nourished. No distress.  HENT:  Head: Normocephalic and atraumatic.  Right Ear: Hearing, tympanic membrane, external ear and ear canal normal.  Left Ear: Hearing, tympanic membrane, external ear and ear canal normal.  Nose: Nose normal.  Mouth/Throat: Uvula is midline, oropharynx is clear and moist and mucous membranes are normal. No oropharyngeal exudate, posterior oropharyngeal edema or posterior oropharyngeal erythema.  Eyes: Pupils are equal, round, and reactive to light. Conjunctivae and EOM are normal. No scleral icterus.  Neck: Normal range of motion. Neck supple. No thyromegaly present.  Cardiovascular: Normal rate, regular rhythm, normal heart sounds and intact distal pulses.  No murmur heard. Pulses:       Radial pulses are 2+ on the right side, and 2+ on the left side.  Pulmonary/Chest: Effort normal and breath sounds normal. No respiratory distress. She has no wheezes. She has no rales.  Abdominal: Soft. Bowel sounds are normal. She exhibits no distension and no mass. There is no tenderness. There is no rebound and no guarding.  Musculoskeletal: Normal range of motion. She exhibits no edema.  Lymphadenopathy:    She has no cervical adenopathy.  Neurological: She is alert and oriented to person, place, and time.  CN grossly intact, station and gait intact Recall 3/3 Calculation 5/5 serial 7s  Skin: Skin is warm and dry. No rash noted.  Psychiatric: She has a normal mood and affect. Her behavior is normal. Judgment and thought content normal.  Nursing note and vitals reviewed.  Results for orders placed or performed in visit on 06/30/17  HM DIABETES EYE EXAM  Result Value Ref Range   HM Diabetic Eye Exam No Retinopathy No Retinopathy  Assessment & Plan:   Problem List Items Addressed This Visit    Abnormal thyroid function test    TFTs stable.       Advanced care planning/counseling discussion    Living will scanned into chart 2017. Husband Chrissie Noa then son Gerald Stabs are [place]. Does not want prolonged life support if terminally or incurably ill.       Diabetes mellitus type 2, uncontrolled (HCC)    Chronic, deteriorated. Will slowly titrate lantus as tolerated to 5u daily.       Relevant Medications   enalapril (VASOTEC) 20 MG tablet   glipiZIDE (GLUCOTROL) 5 MG tablet   Insulin Glargine (LANTUS SOLOSTAR) 100 UNIT/ML Solostar Pen   Essential hypertension, benign    Chronic, stable. Continue current regimen.       Relevant Medications   amLODipine (NORVASC) 5 MG tablet   enalapril (VASOTEC) 20 MG tablet   Colesevelam HCl 3.75 g PACK   Healthcare maintenance    Preventative protocols reviewed and updated unless pt declined. Discussed healthy diet and lifestyle.        History of malignant melanoma    Sees derm yearly.      HLD (hyperlipidemia)    Chronic, improved but LDL still above goal. Intolerant to statins. Agrees to retrial welchol - will send in powder which seems better covered by insurance.  The 10-year ASCVD risk score Mikey Bussing DC Brooke Bonito., et al., 2013) is: 17.5%   Values used to calculate the score:     Age: 49 years     Sex: Female     Is Non-Hispanic African American: No     Diabetic: Yes     Tobacco smoker: No     Systolic Blood Pressure: 711 mmHg     Is BP treated: Yes     HDL Cholesterol: 39.4 mg/dL     Total Cholesterol: 177 mg/dL       Relevant Medications   amLODipine (NORVASC) 5 MG tablet   enalapril (VASOTEC) 20 MG tablet   Colesevelam HCl 3.75 g PACK   Medicare annual wellness visit, subsequent - Primary    I have personally reviewed the Medicare Annual Wellness questionnaire and have noted 1. The patient's medical and social history 2. Their use of alcohol, tobacco or illicit drugs 3. Their current medications and supplements 4. The patient's functional ability including ADL's, fall risks, home safety risks and hearing or visual impairment. Cognitive function has been assessed and addressed as indicated.  5. Diet and physical activity 6. Evidence for depression or mood disorders The patients weight, height, BMI have been recorded in the chart. I have made referrals, counseling and provided education to the patient based on review of the above and I have provided the pt with a written personalized care plan for preventive services. Provider list updated.. See scanned questionairre as needed for further documentation. Reviewed preventative protocols and updated unless pt declined.       Osteopenia    Discussed calcium, vit D dosing, routine weight bearing exercise. Reviewed daily calcium goals, ideally from diet       Uveitis-hyphema-glaucoma syndrome ([place])    Appreciate ophtho care.           Meds ordered this  encounter  Medications  . amLODipine (NORVASC) 5 MG tablet    Sig: Take 1 tablet (5 mg total) by mouth at bedtime.    Dispense:  90 tablet    Refill:  3  . Insulin Pen Needle (BD PEN NEEDLE MICRO U/F)  32G X 6 MM MISC    Sig: Use as directed once daliy    Dispense:  100 each    Refill:  3  . enalapril (VASOTEC) 20 MG tablet    Sig: Take 1 tablet (20 mg total) by mouth 2 (two) times daily.    Dispense:  180 tablet    Refill:  3  . glipiZIDE (GLUCOTROL) 5 MG tablet    Sig: Take 1 tablet (5 mg total) by mouth 2 (two) times daily before a meal.    Dispense:  180 tablet    Refill:  3  . Insulin Glargine (LANTUS SOLOSTAR) 100 UNIT/ML Solostar Pen    Sig: Inject 15 Units into the skin daily at 10 pm.    Dispense:  5 pen    Refill:  3  . Colesevelam HCl 3.75 g PACK    Sig: Take 1 packet (3.75 g total) by mouth daily.    Dispense:  30 each    Refill:  11   Orders Placed This Encounter  Procedures  . HM DIABETES EYE EXAM    This external order was created through the Results Console.    Follow up plan: Return in about 6 months (around 12/31/2017) for follow up visit.  Ria Bush, MD

## 2017-06-30 NOTE — Assessment & Plan Note (Signed)
Chronic, deteriorated. Will slowly titrate lantus as tolerated to 5u daily.

## 2017-06-30 NOTE — Assessment & Plan Note (Signed)
Discussed calcium, vit D dosing, routine weight bearing exercise. Reviewed daily calcium goals, ideally from diet

## 2017-06-30 NOTE — Assessment & Plan Note (Signed)
Chronic, stable. Continue current regimen. 

## 2017-06-30 NOTE — Assessment & Plan Note (Signed)
Sees derm yearly

## 2017-06-30 NOTE — Assessment & Plan Note (Signed)
Appreciate ophtho care.  

## 2017-06-30 NOTE — Assessment & Plan Note (Signed)

## 2017-06-30 NOTE — Assessment & Plan Note (Addendum)
Living will scanned into chart 2017. Husband Elizabeth Curtis then son Elizabeth Curtis are [place]. Does not want prolonged life support if terminally or incurably ill.

## 2017-07-05 ENCOUNTER — Other Ambulatory Visit: Payer: Self-pay | Admitting: Family Medicine

## 2017-09-21 DIAGNOSIS — H26499 Other secondary cataract, unspecified eye: Secondary | ICD-10-CM | POA: Diagnosis not present

## 2017-10-26 LAB — CBC AND DIFFERENTIAL
Hemoglobin: 13.3 (ref 12.0–16.0)
Platelets: 194 (ref 150–399)
WBC: 5.2

## 2017-10-26 LAB — LIPID PANEL
Cholesterol: 177 (ref 0–200)
HDL: 42 (ref 35–70)
LDL Cholesterol: 108
Triglycerides: 136 (ref 40–160)

## 2017-10-26 LAB — BASIC METABOLIC PANEL
CREATININE: 0.7 (ref 0.5–1.1)
GLUCOSE: 170
POTASSIUM: 4.5 (ref 3.4–5.3)
Sodium: 142 (ref 137–147)

## 2017-10-26 LAB — IRON,TIBC AND FERRITIN PANEL: Iron: 57

## 2017-10-26 LAB — HEPATIC FUNCTION PANEL
ALT: 12 (ref 7–35)
AST: 9 — AB (ref 13–35)
Alkaline Phosphatase: 58 (ref 25–125)
BILIRUBIN, TOTAL: 0.3

## 2017-12-10 ENCOUNTER — Encounter: Payer: Self-pay | Admitting: Family Medicine

## 2017-12-10 DIAGNOSIS — L821 Other seborrheic keratosis: Secondary | ICD-10-CM | POA: Diagnosis not present

## 2017-12-10 DIAGNOSIS — E1165 Type 2 diabetes mellitus with hyperglycemia: Secondary | ICD-10-CM

## 2017-12-10 DIAGNOSIS — D2262 Melanocytic nevi of left upper limb, including shoulder: Secondary | ICD-10-CM | POA: Diagnosis not present

## 2017-12-10 DIAGNOSIS — Z85820 Personal history of malignant melanoma of skin: Secondary | ICD-10-CM | POA: Diagnosis not present

## 2017-12-10 DIAGNOSIS — Z09 Encounter for follow-up examination after completed treatment for conditions other than malignant neoplasm: Secondary | ICD-10-CM | POA: Diagnosis not present

## 2017-12-10 DIAGNOSIS — Z872 Personal history of diseases of the skin and subcutaneous tissue: Secondary | ICD-10-CM | POA: Diagnosis not present

## 2017-12-10 DIAGNOSIS — D2272 Melanocytic nevi of left lower limb, including hip: Secondary | ICD-10-CM | POA: Diagnosis not present

## 2017-12-10 DIAGNOSIS — E782 Mixed hyperlipidemia: Secondary | ICD-10-CM

## 2017-12-10 DIAGNOSIS — R946 Abnormal results of thyroid function studies: Secondary | ICD-10-CM

## 2017-12-10 DIAGNOSIS — D2261 Melanocytic nevi of right upper limb, including shoulder: Secondary | ICD-10-CM | POA: Diagnosis not present

## 2017-12-10 NOTE — Telephone Encounter (Signed)
Will you order labs pt will need for 12/20/17 f/u OV?

## 2017-12-12 NOTE — Telephone Encounter (Signed)
plz schedule lab visit prior to upcoming appt. I have ordered labs.

## 2017-12-14 ENCOUNTER — Encounter: Payer: Self-pay | Admitting: Family Medicine

## 2017-12-15 ENCOUNTER — Other Ambulatory Visit (INDEPENDENT_AMBULATORY_CARE_PROVIDER_SITE_OTHER): Payer: PPO

## 2017-12-15 DIAGNOSIS — E782 Mixed hyperlipidemia: Secondary | ICD-10-CM | POA: Diagnosis not present

## 2017-12-15 DIAGNOSIS — R946 Abnormal results of thyroid function studies: Secondary | ICD-10-CM | POA: Diagnosis not present

## 2017-12-15 DIAGNOSIS — E1165 Type 2 diabetes mellitus with hyperglycemia: Secondary | ICD-10-CM

## 2017-12-15 LAB — BASIC METABOLIC PANEL
BUN: 15 mg/dL (ref 6–23)
CHLORIDE: 102 meq/L (ref 96–112)
CO2: 29 meq/L (ref 19–32)
CREATININE: 0.67 mg/dL (ref 0.40–1.20)
Calcium: 9.2 mg/dL (ref 8.4–10.5)
GFR: 93.05 mL/min (ref >60.00)
GLUCOSE: 179 mg/dL — AB (ref 70–99)
Potassium: 3.6 mEq/L (ref 3.5–5.1)
Sodium: 140 mEq/L (ref 135–145)

## 2017-12-15 LAB — LIPID PANEL
CHOLESTEROL: 199 mg/dL (ref 0–200)
HDL: 44.6 mg/dL (ref >39.00)
LDL CALC: 122 mg/dL — AB (ref 0–99)
NonHDL: 153.95
Total CHOL/HDL Ratio: 4
Triglycerides: 159 mg/dL — ABNORMAL HIGH (ref 0.0–149.0)
VLDL: 31.8 mg/dL (ref 0.0–40.0)

## 2017-12-15 LAB — TSH: TSH: 0.99 u[IU]/mL (ref 0.35–4.50)

## 2017-12-15 LAB — HEMOGLOBIN A1C: Hgb A1c MFr Bld: 7.5 % — ABNORMAL HIGH (ref 4.6–6.5)

## 2017-12-20 ENCOUNTER — Ambulatory Visit: Payer: PPO | Admitting: Family Medicine

## 2017-12-21 ENCOUNTER — Encounter: Payer: Self-pay | Admitting: Family Medicine

## 2018-01-13 ENCOUNTER — Ambulatory Visit (INDEPENDENT_AMBULATORY_CARE_PROVIDER_SITE_OTHER): Payer: PPO | Admitting: Family Medicine

## 2018-01-13 ENCOUNTER — Other Ambulatory Visit: Payer: Self-pay | Admitting: Obstetrics & Gynecology

## 2018-01-13 ENCOUNTER — Encounter: Payer: Self-pay | Admitting: Family Medicine

## 2018-01-13 VITALS — BP 122/82 | HR 84 | Temp 98.4°F | Ht 68.5 in | Wt 172.0 lb

## 2018-01-13 DIAGNOSIS — R202 Paresthesia of skin: Secondary | ICD-10-CM | POA: Diagnosis not present

## 2018-01-13 DIAGNOSIS — M6530 Trigger finger, unspecified finger: Secondary | ICD-10-CM

## 2018-01-13 DIAGNOSIS — H4040X0 Glaucoma secondary to eye inflammation, unspecified eye, stage unspecified: Secondary | ICD-10-CM

## 2018-01-13 DIAGNOSIS — E782 Mixed hyperlipidemia: Secondary | ICD-10-CM | POA: Diagnosis not present

## 2018-01-13 DIAGNOSIS — F4321 Adjustment disorder with depressed mood: Secondary | ICD-10-CM | POA: Diagnosis not present

## 2018-01-13 DIAGNOSIS — E1165 Type 2 diabetes mellitus with hyperglycemia: Secondary | ICD-10-CM

## 2018-01-13 DIAGNOSIS — T8579XD Infection and inflammatory reaction due to other internal prosthetic devices, implants and grafts, subsequent encounter: Secondary | ICD-10-CM

## 2018-01-13 NOTE — Patient Instructions (Addendum)
Start taking colesevelam packets.  Continue current medicines. Start checking sugars and let me know how they're running.  Watch triggering of right hand for now, if more bothersome let me know.  Try tylenol 500mg  1-2 tablets a day for joint pains.

## 2018-01-13 NOTE — Progress Notes (Signed)
BP 122/82 (BP Location: Left Arm, Patient Position: Sitting, Cuff Size: Normal)   Pulse 84   Temp 98.4 F (36.9 C) (Oral)   Ht 5' 8.5" (1.74 m)   Wt 172 lb (78 kg)   SpO2 98%   BMI 25.77 kg/m    CC: 6 mo f/u visit Subjective:    Patient ID: Elizabeth Curtis, female    DOB: 11-Apr-1950, 68 y.o.   MRN: [id number]  HPI: Elizabeth Curtis is a 67 y.o. female presenting on 01/13/2018 for Follow-up (Here for 6 mo f/u.)   Best friend passed away 1 month ago suddenly after MVA ([place]). This has been a difficult time for patient.   L eye uveitis glaucoma hyphema syndrome established with Dr Leanora Ivanoff at [facility]. She had intraocular lens replacement surgery. Released from care - now sees Dr Edison Pace.   HLD - does not want statin. Has not been taking colesevelam.   DM - does not regularly check sugars - rough month. Compliant with antihyperglycemic regimen which includes: metformin XR 500/1000mg , glipizide 5mg  bid, and lantus 15u daily. Denies low sugars or hypoglycemic symptoms. Denies paresthesias. Last diabetic eye exam 04/2017. Pneumovax: 2013. Prevnar: 2017. Glucometer brand: freestyle meter. DSME: completed remotely [facility]. Lab Results  Component Value Date   HGBA1C 7.5 (H) 12/15/2017   Diabetic Foot Exam - Simple   Simple Foot Form Diabetic Foot exam was performed with the following findings:  Yes 01/13/2018  9:36 AM  Visual Inspection No deformities, no ulcerations, no other skin breakdown bilaterally:  Yes Sensation Testing Intact to touch and monofilament testing bilaterally:  Yes Pulse Check Posterior Tibialis and Dorsalis pulse intact bilaterally:  Yes Comments    Lab Results  Component Value Date   MICROALBUR <0.7 03/01/2015     Relevant past medical, surgical, family and social history reviewed and updated as indicated. Interim medical history since our last visit reviewed. Allergies and medications reviewed and updated. Outpatient Medications Prior to Visit  Medication  Sig Dispense Refill  . amLODipine (NORVASC) 5 MG tablet Take 1 tablet (5 mg total) by mouth at bedtime. 90 tablet 3  . enalapril (VASOTEC) 20 MG tablet Take 1 tablet (20 mg total) by mouth 2 (two) times daily. 180 tablet 3  . glipiZIDE (GLUCOTROL) 5 MG tablet Take 1 tablet (5 mg total) by mouth 2 (two) times daily before a meal. 180 tablet 3  . glucose blood (FREESTYLE LITE) test strip Use to check sugar once daily and as needed. Dx:E11.65 100 each 3  . Insulin Glargine (LANTUS SOLOSTAR) 100 UNIT/ML Solostar Pen Inject 15 Units into the skin daily at 10 pm. 5 pen 3  . Insulin Pen Needle (BD PEN NEEDLE MICRO U/F) 32G X 6 MM MISC Use as directed once daliy 100 each 3  . metFORMIN (GLUCOPHAGE-XR) 500 MG 24 hr tablet TAKE ONE TABLET IN THE AM AND TWO IN THE PM WITH MEALS 270 tablet 3  . cholecalciferol (VITAMIN D) 1000 units tablet Take 1,000 Units by mouth daily.    . Colesevelam HCl 3.75 g PACK Take 1 packet (3.75 g total) by mouth daily.    . Colesevelam HCl 3.75 g PACK Take 1 packet (3.75 g total) by mouth daily. 30 each 11   No facility-administered medications prior to visit.      Per HPI unless specifically indicated in ROS section below Review of Systems     Objective:    BP 122/82 (BP Location: Left Arm, Patient Position: Sitting, Cuff Size:  Normal)   Pulse 84   Temp 98.4 F (36.9 C) (Oral)   Ht 5' 8.5" (1.74 m)   Wt 172 lb (78 kg)   SpO2 98%   BMI 25.77 kg/m   Wt Readings from Last 3 Encounters:  01/13/18 172 lb (78 kg)  06/30/17 170 lb (77.1 kg)  06/18/17 169 lb 12 oz (77 kg)    Physical Exam  Constitutional: She appears well-developed and well-nourished. No distress.  HENT:  Head: Normocephalic and atraumatic.  Right Ear: External ear normal.  Left Ear: External ear normal.  Nose: Nose normal.  Mouth/Throat: Oropharynx is clear and moist. No oropharyngeal exudate.  Eyes: Pupils are equal, round, and reactive to light. Conjunctivae and EOM are normal. No scleral  icterus.  Neck: Normal range of motion. Neck supple.  Cardiovascular: Normal rate, regular rhythm, normal heart sounds and intact distal pulses.  No murmur heard. Pulmonary/Chest: Effort normal and breath sounds normal. No respiratory distress. She has no wheezes. She has no rales.  Musculoskeletal: She exhibits no edema.  See HPI for foot exam if done Sensation intact bilateral feet R hand without obvious triggering or nodule of 4th digit  Lymphadenopathy:    She has no cervical adenopathy.  Skin: Skin is warm and dry. No rash noted.  Psychiatric: She has a normal mood and affect.  Nursing note and vitals reviewed.  Results for orders placed or performed in visit on 12/21/17  Iron, TIBC and Ferritin Panel  Result Value Ref Range   Iron 57   CBC and differential  Result Value Ref Range   Hemoglobin 13.3 12.0 - 16.0   Platelets 194 150 - 399   WBC 5.2   Basic metabolic panel  Result Value Ref Range   Glucose 170    Creatinine 0.7 0.5 - 1.1   Potassium 4.5 3.4 - 5.3   Sodium 142 137 - 147  Lipid panel  Result Value Ref Range   Triglycerides 136 40 - 160   Cholesterol 177 0 - 200   HDL 42 35 - 70   LDL Cholesterol 108   Hepatic function panel  Result Value Ref Range   Alkaline Phosphatase 58 25 - 125   ALT 12 7 - 35   AST 9 (A) 13 - 35   Bilirubin, Total 0.3       Assessment & Plan:   Problem List Items Addressed This Visit    Uveitis-hyphema-glaucoma syndrome ([place])    Followed regularly by eye doctor      Trigger finger of right hand    Will monitor for now. Pt will let me know if becoming more bothersome.      Paresthesia of left foot    Consider labwork for further evaluation.      HLD (hyperlipidemia)    Chronic. Does not want statins - h/o intolerance to all statins. Did not start colesevelam. Advised to trial packet daily. Reviewed risks of high cholesterol in setting of diabetes  The 10-year ASCVD risk score Mikey Bussing DC Brooke Bonito., et al., 2013) is: 18.2%    Values used to calculate the score:     Age: 45 years     Sex: Female     Is Non-Hispanic African American: No     Diabetic: Yes     Tobacco smoker: No     Systolic Blood Pressure: 892 mmHg     Is BP treated: Yes     HDL Cholesterol: 44.6 mg/dL     Total Cholesterol:  199 mg/dL       Relevant Medications   Colesevelam HCl 3.75 g PACK   Grieving    Difficult month. Support provided.       Diabetes mellitus type 2, uncontrolled ([place]) - Primary    Chronic, slightly improved but still above goal. Pt attributes to stressful last few months. Has not been checking sugars regularly. Encouraged renewed efforts at diabetic diet and return to routine glycemic monitoring. Foot exam today. Continue current regimen          No orders of the defined types were placed in this encounter.  No orders of the defined types were placed in this encounter.   Follow up plan: Return in about 6 months (around 07/15/2018) for annual exam, prior fasting for blood work, medicare wellness visit.  Ria Bush, MD

## 2018-01-14 DIAGNOSIS — R202 Paresthesia of skin: Secondary | ICD-10-CM | POA: Insufficient documentation

## 2018-01-14 DIAGNOSIS — M6530 Trigger finger, unspecified finger: Secondary | ICD-10-CM | POA: Insufficient documentation

## 2018-01-14 DIAGNOSIS — F4321 Adjustment disorder with depressed mood: Secondary | ICD-10-CM | POA: Insufficient documentation

## 2018-01-14 NOTE — Assessment & Plan Note (Signed)
Will monitor for now. Pt will let me know if becoming more bothersome.

## 2018-01-14 NOTE — Assessment & Plan Note (Signed)
Difficult month. Support provided.

## 2018-01-14 NOTE — Assessment & Plan Note (Signed)
Followed regularly by eye doctor

## 2018-01-14 NOTE — Assessment & Plan Note (Addendum)
Chronic, slightly improved but still above goal. Pt attributes to stressful last few months. Has not been checking sugars regularly. Encouraged renewed efforts at diabetic diet and return to routine glycemic monitoring. Foot exam today. Continue current regimen

## 2018-01-14 NOTE — Assessment & Plan Note (Signed)
Consider labwork for further evaluation.

## 2018-01-14 NOTE — Assessment & Plan Note (Signed)
Chronic. Does not want statins - h/o intolerance to all statins. Did not start colesevelam. Advised to trial packet daily. Reviewed risks of high cholesterol in setting of diabetes  The 10-year ASCVD risk score Mikey Bussing DC Brooke Bonito., et al., 2013) is: 18.2%   Values used to calculate the score:     Age: 67 years     Sex: Female     Is Non-Hispanic African American: No     Diabetic: Yes     Tobacco smoker: No     Systolic Blood Pressure: 069 mmHg     Is BP treated: Yes     HDL Cholesterol: 44.6 mg/dL     Total Cholesterol: 199 mg/dL

## 2018-01-19 ENCOUNTER — Other Ambulatory Visit: Payer: Self-pay | Admitting: Family Medicine

## 2018-01-19 ENCOUNTER — Other Ambulatory Visit: Payer: Self-pay | Admitting: Obstetrics & Gynecology

## 2018-01-19 DIAGNOSIS — Z1231 Encounter for screening mammogram for malignant neoplasm of breast: Secondary | ICD-10-CM

## 2018-02-09 DIAGNOSIS — H269 Unspecified cataract: Secondary | ICD-10-CM

## 2018-02-09 HISTORY — DX: Unspecified cataract: H26.9

## 2018-03-10 DIAGNOSIS — J111 Influenza due to unidentified influenza virus with other respiratory manifestations: Secondary | ICD-10-CM | POA: Diagnosis not present

## 2018-03-16 ENCOUNTER — Ambulatory Visit
Admission: RE | Admit: 2018-03-16 | Discharge: 2018-03-16 | Disposition: A | Discharge status: Home / Self Care | Payer: PPO | Source: Ambulatory Visit | Attending: Family Medicine | Admitting: Family Medicine

## 2018-03-16 ENCOUNTER — Encounter: Payer: Self-pay | Admitting: Family Medicine

## 2018-03-16 DIAGNOSIS — Z1231 Encounter for screening mammogram for malignant neoplasm of breast: Secondary | ICD-10-CM | POA: Insufficient documentation

## 2018-03-16 LAB — HM MAMMOGRAPHY

## 2018-03-16 IMAGING — MG DIGITAL SCREENING BILATERAL MAMMOGRAM WITH TOMO AND CAD
8 series · 9 of 24 positions shown · non-contrast
Comparison: Previous exam(s).

CLINICAL DATA: Screening.

EXAM:
DIGITAL SCREENING BILATERAL MAMMOGRAM WITH TOMO AND CAD

[R CC synth-2D]
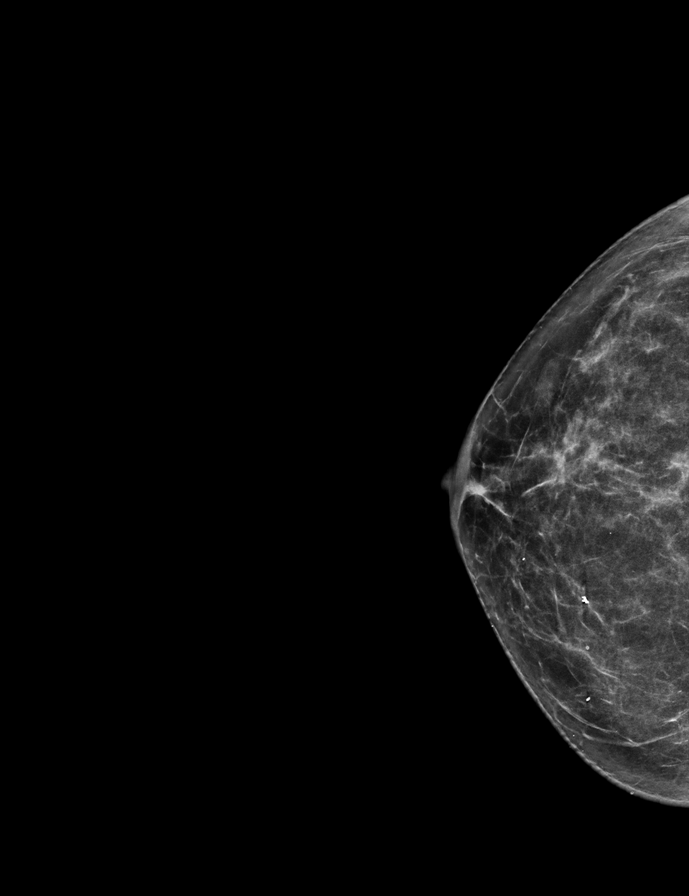

[L CC synth-2D]
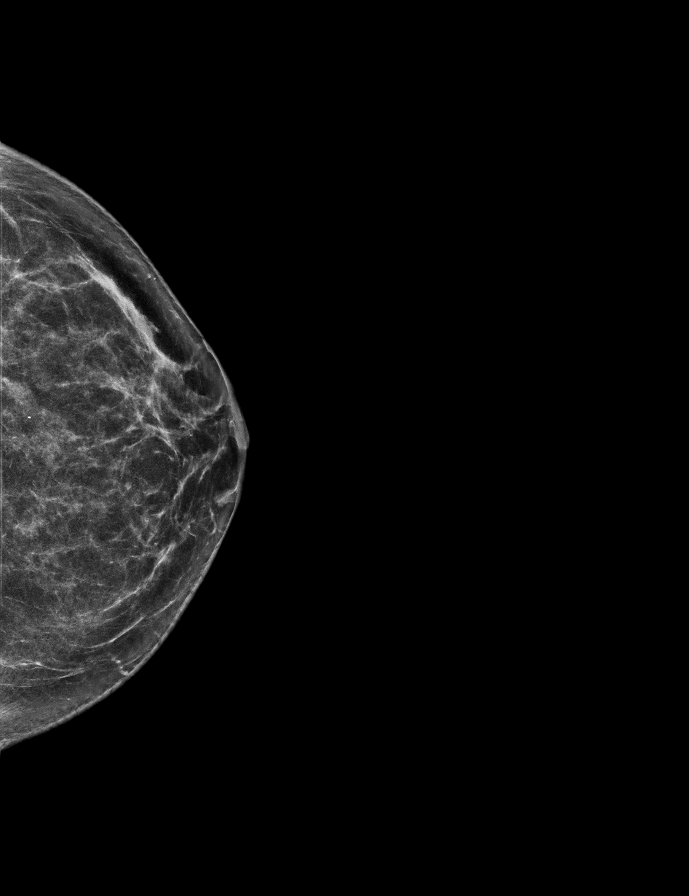

[R MLO synth-2D]
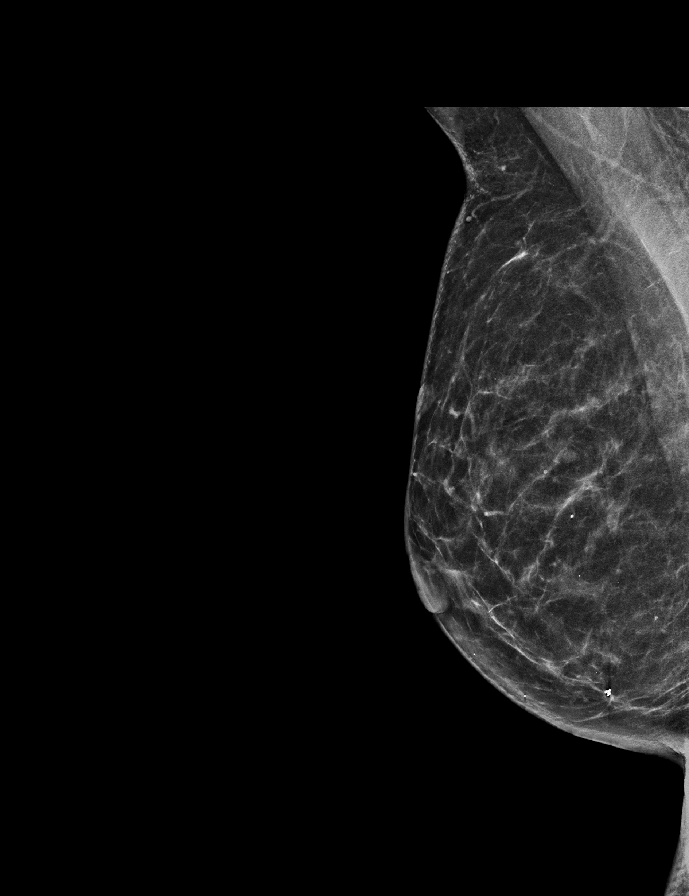

[L MLO synth-2D]
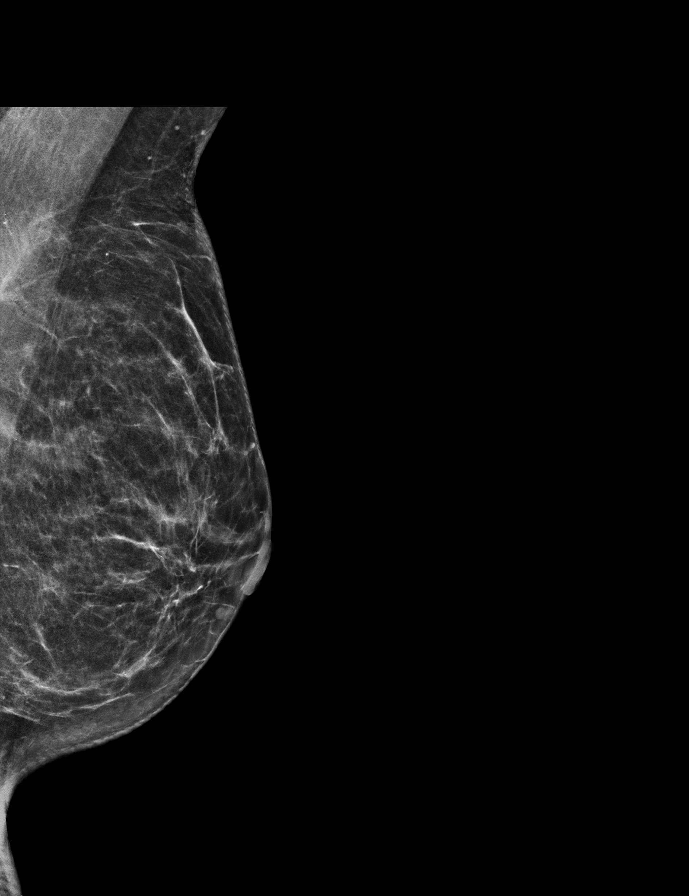

[L MLO tomo · 2 of 57 frames shown]
[frame 19/57]
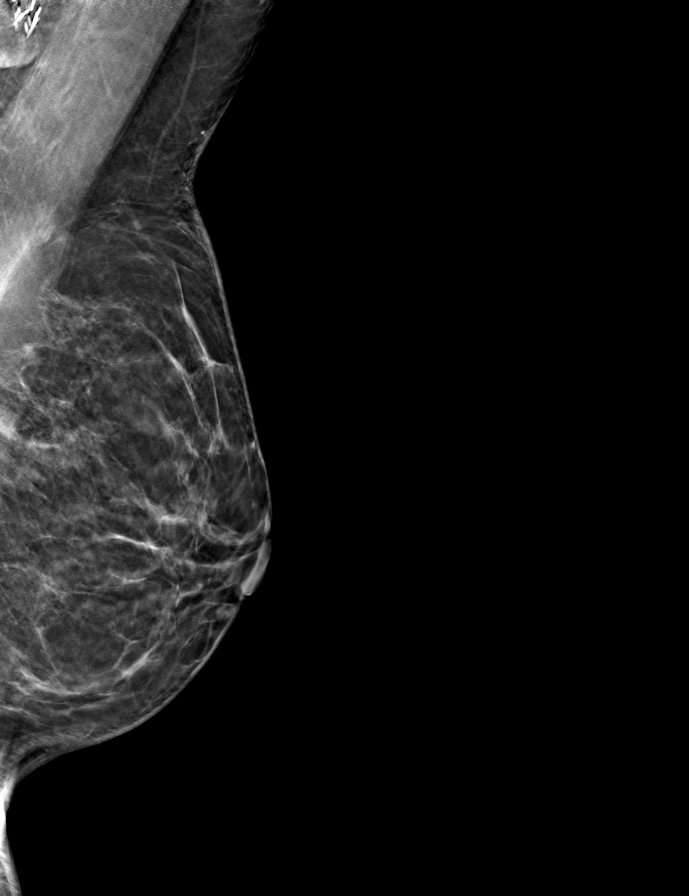
[frame 29/57]
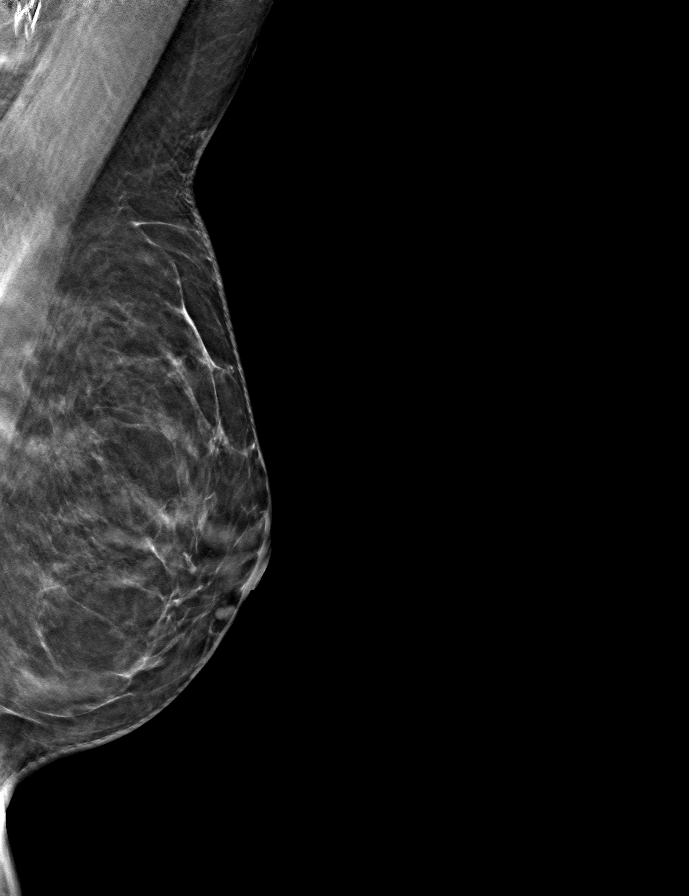

[R MLO tomo · tomo slice 29/57.0]
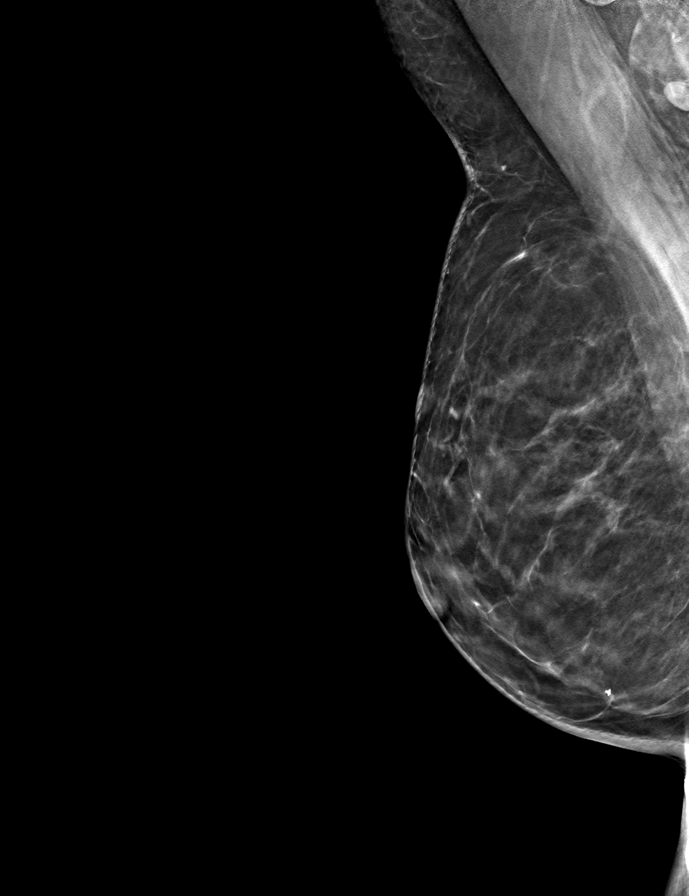

[L CC tomo · tomo slice 30/59.0]
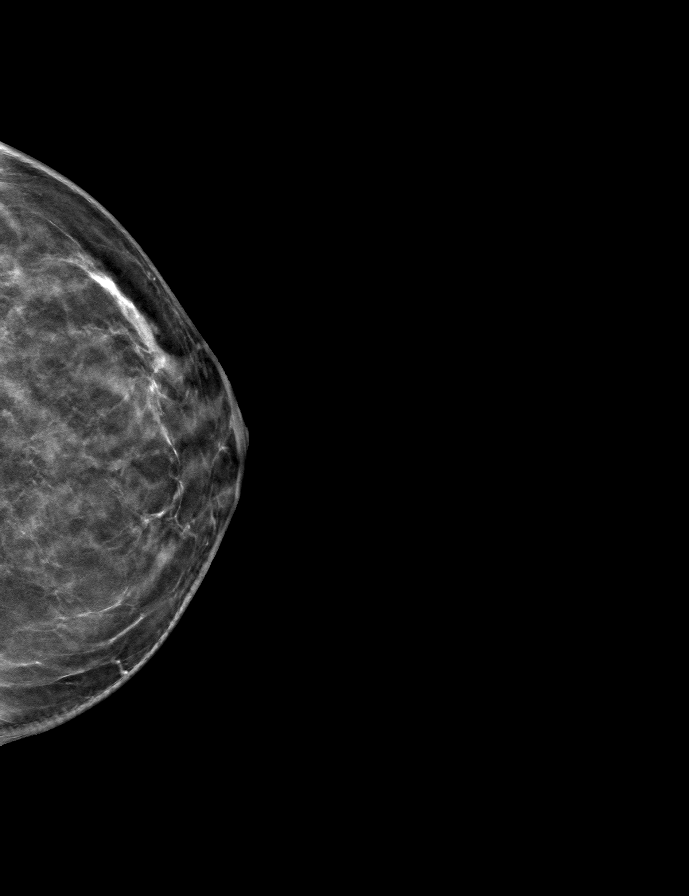

[R CC tomo · tomo slice 29/58.0]
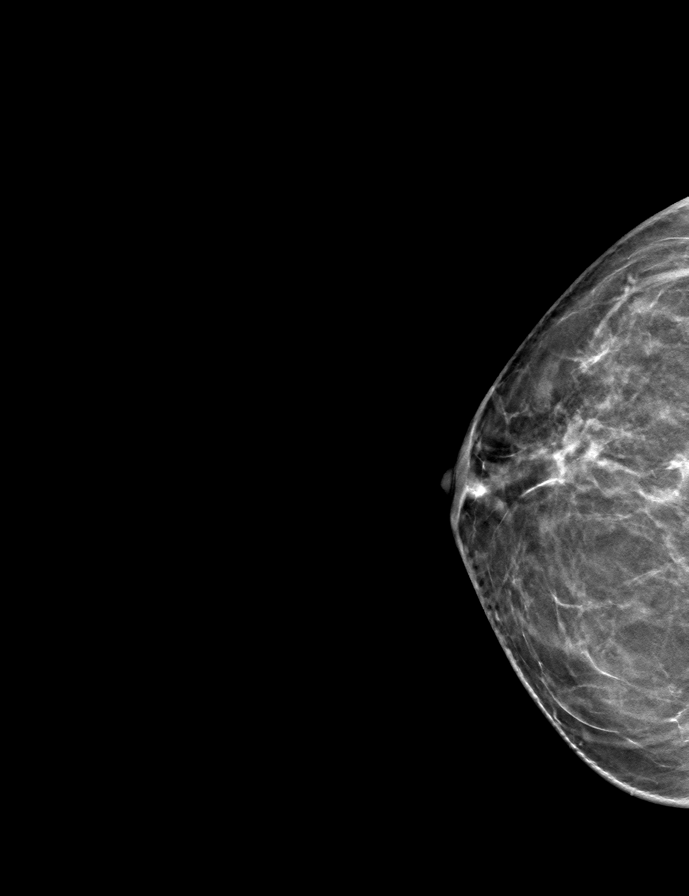

[9 of 24 positions shown; findings below may reference images not displayed]

ACR Breast Density Category b: There are scattered areas of
fibroglandular density.
FINDINGS: There are no findings suspicious for malignancy. Images were
processed with CAD.
IMPRESSION: No mammographic evidence of malignancy. A result letter of this
screening mammogram will be mailed directly to the patient.

RECOMMENDATION:
Screening mammogram in one year. (Code:[TQ])

BI-RADS CATEGORY  1: Negative.

## 2018-03-29 DIAGNOSIS — M9902 Segmental and somatic dysfunction of thoracic region: Secondary | ICD-10-CM | POA: Diagnosis not present

## 2018-03-29 DIAGNOSIS — M5384 Other specified dorsopathies, thoracic region: Secondary | ICD-10-CM | POA: Diagnosis not present

## 2018-04-05 DIAGNOSIS — M9903 Segmental and somatic dysfunction of lumbar region: Secondary | ICD-10-CM | POA: Diagnosis not present

## 2018-04-05 DIAGNOSIS — M5134 Other intervertebral disc degeneration, thoracic region: Secondary | ICD-10-CM | POA: Diagnosis not present

## 2018-04-05 DIAGNOSIS — M9902 Segmental and somatic dysfunction of thoracic region: Secondary | ICD-10-CM | POA: Diagnosis not present

## 2018-04-05 DIAGNOSIS — M5416 Radiculopathy, lumbar region: Secondary | ICD-10-CM | POA: Diagnosis not present

## 2018-04-10 ENCOUNTER — Other Ambulatory Visit: Payer: Self-pay | Admitting: Family Medicine

## 2018-04-12 DIAGNOSIS — M5134 Other intervertebral disc degeneration, thoracic region: Secondary | ICD-10-CM | POA: Diagnosis not present

## 2018-04-12 DIAGNOSIS — M5416 Radiculopathy, lumbar region: Secondary | ICD-10-CM | POA: Diagnosis not present

## 2018-04-12 DIAGNOSIS — M9903 Segmental and somatic dysfunction of lumbar region: Secondary | ICD-10-CM | POA: Diagnosis not present

## 2018-04-12 DIAGNOSIS — M9902 Segmental and somatic dysfunction of thoracic region: Secondary | ICD-10-CM | POA: Diagnosis not present

## 2018-04-19 DIAGNOSIS — M9903 Segmental and somatic dysfunction of lumbar region: Secondary | ICD-10-CM | POA: Diagnosis not present

## 2018-04-19 DIAGNOSIS — M5416 Radiculopathy, lumbar region: Secondary | ICD-10-CM | POA: Diagnosis not present

## 2018-04-19 DIAGNOSIS — M9902 Segmental and somatic dysfunction of thoracic region: Secondary | ICD-10-CM | POA: Diagnosis not present

## 2018-04-19 DIAGNOSIS — M5134 Other intervertebral disc degeneration, thoracic region: Secondary | ICD-10-CM | POA: Diagnosis not present

## 2018-04-21 DIAGNOSIS — Z01419 Encounter for gynecological examination (general) (routine) without abnormal findings: Secondary | ICD-10-CM | POA: Diagnosis not present

## 2018-04-21 DIAGNOSIS — Z8742 Personal history of other diseases of the female genital tract: Secondary | ICD-10-CM | POA: Diagnosis not present

## 2018-04-21 DIAGNOSIS — Z8619 Personal history of other infectious and parasitic diseases: Secondary | ICD-10-CM | POA: Diagnosis not present

## 2018-04-21 DIAGNOSIS — Z1211 Encounter for screening for malignant neoplasm of colon: Secondary | ICD-10-CM | POA: Diagnosis not present

## 2018-06-05 ENCOUNTER — Other Ambulatory Visit: Payer: Self-pay | Admitting: Family Medicine

## 2018-07-02 ENCOUNTER — Other Ambulatory Visit: Payer: Self-pay | Admitting: Family Medicine

## 2018-07-05 ENCOUNTER — Encounter: Payer: Self-pay | Admitting: Family Medicine

## 2018-07-09 ENCOUNTER — Other Ambulatory Visit: Payer: Self-pay | Admitting: Family Medicine

## 2018-07-12 ENCOUNTER — Telehealth: Payer: Self-pay

## 2018-07-12 NOTE — Telephone Encounter (Signed)
Lm for pt to call back to schedule recall colon

## 2018-07-14 ENCOUNTER — Other Ambulatory Visit: Payer: Self-pay | Admitting: Family Medicine

## 2018-07-14 DIAGNOSIS — E119 Type 2 diabetes mellitus without complications: Secondary | ICD-10-CM | POA: Diagnosis not present

## 2018-07-14 DIAGNOSIS — R946 Abnormal results of thyroid function studies: Secondary | ICD-10-CM

## 2018-07-14 DIAGNOSIS — E1165 Type 2 diabetes mellitus with hyperglycemia: Secondary | ICD-10-CM

## 2018-07-14 DIAGNOSIS — E782 Mixed hyperlipidemia: Secondary | ICD-10-CM

## 2018-07-14 LAB — HM DIABETES EYE EXAM

## 2018-07-15 ENCOUNTER — Ambulatory Visit (INDEPENDENT_AMBULATORY_CARE_PROVIDER_SITE_OTHER): Payer: PPO

## 2018-07-15 ENCOUNTER — Other Ambulatory Visit (INDEPENDENT_AMBULATORY_CARE_PROVIDER_SITE_OTHER): Payer: PPO

## 2018-07-15 VITALS — Wt 168.0 lb

## 2018-07-15 DIAGNOSIS — R946 Abnormal results of thyroid function studies: Secondary | ICD-10-CM

## 2018-07-15 DIAGNOSIS — E782 Mixed hyperlipidemia: Secondary | ICD-10-CM | POA: Diagnosis not present

## 2018-07-15 DIAGNOSIS — E1165 Type 2 diabetes mellitus with hyperglycemia: Secondary | ICD-10-CM

## 2018-07-15 DIAGNOSIS — Z0000 Encounter for general adult medical examination without abnormal findings: Secondary | ICD-10-CM

## 2018-07-15 LAB — LIPID PANEL
Cholesterol: 171 mg/dL (ref 0–200)
HDL: 40.1 mg/dL (ref >39.00)
LDL Cholesterol: 102 mg/dL — ABNORMAL HIGH (ref 0–99)
NonHDL: 130.95
Total CHOL/HDL Ratio: 4
Triglycerides: 147 mg/dL (ref 0.0–149.0)
VLDL: 29.4 mg/dL (ref 0.0–40.0)

## 2018-07-15 LAB — COMPREHENSIVE METABOLIC PANEL
ALT: 15 U/L (ref 0–35)
AST: 12 U/L (ref 0–37)
Albumin: 4.3 g/dL (ref 3.5–5.2)
Alkaline Phosphatase: 48 U/L (ref 39–117)
BUN: 14 mg/dL (ref 6–23)
CO2: 30 mEq/L (ref 19–32)
Calcium: 9.3 mg/dL (ref 8.4–10.5)
Chloride: 102 mEq/L (ref 96–112)
Creatinine, Ser: 0.68 mg/dL (ref 0.40–1.20)
GFR: 85.92 mL/min (ref >60.00)
Glucose, Bld: 168 mg/dL — ABNORMAL HIGH (ref 70–99)
Potassium: 4.1 mEq/L (ref 3.5–5.1)
Sodium: 141 mEq/L (ref 135–145)
Total Bilirubin: 0.6 mg/dL (ref 0.2–1.2)
Total Protein: 6.4 g/dL (ref 6.0–8.3)

## 2018-07-15 LAB — T4, FREE: Free T4: 1.04 ng/dL (ref 0.60–1.60)

## 2018-07-15 LAB — TSH: TSH: 0.6 u[IU]/mL (ref 0.35–4.50)

## 2018-07-15 LAB — HEMOGLOBIN A1C: Hgb A1c MFr Bld: 8.1 % — ABNORMAL HIGH (ref 4.6–6.5)

## 2018-07-15 NOTE — Progress Notes (Signed)
Subjective:   Elizabeth Curtis is a 68 y.o. female who presents for Medicare Annual (Subsequent) preventive examination.  Review of Systems:  N/A Cardiac Risk Factors include: advanced age (>81men, >57 women);dyslipidemia;hypertension;diabetes mellitus     Objective:     Vitals: Wt 168 lb (76.2 kg) Comment: patient supplied  BMI 25.17 kg/m   Body mass index is 25.17 kg/m.  Advanced Directives 07/15/2018 04/29/2016  Does Patient Have a Medical Advance Directive? Yes Yes  Type of Paramedic of [place];Living will [place];Living will  Copy of [place] in Chart? Yes - validated most recent copy scanned in chart (See row information) No - copy requested    Tobacco Social History   Tobacco Use  Smoking Status Never Smoker  Smokeless Tobacco Never Used     Counseling given: No   Clinical Intake:  Pre-visit preparation completed: Yes  Pain : No/denies pain Pain Score: 0-No pain     Nutritional Status: BMI 25 -29 Overweight Nutritional Risks: None Diabetes: No  How often do you need to have someone help you when you read instructions, pamphlets, or other written materials from your doctor or pharmacy?: 1 - Never What is the last grade level you completed in school?: Bachelor degree  Interpreter Needed?: No  Comments: pt lives with spouse Information entered by :: LPinson, LPN  Past Medical History:  Diagnosis Date  . Cataract 2020   right eye  . Chronic low back pain   . Diabetes mellitus   . Diffuse cystic mastopathy   . Disturbance of skin sensation   . Diverticulosis of colon (without mention of hemorrhage)   . Dysphagia, unspecified(787.20)   . Essential hypertension, benign 1995  . H/O cystitis 2011  . History of colonic polyps    Hyperplastic  . History of pelvic ultrasound 03/05   Uterus 13 X 7 X 8cm, Fibroid 2.6cm  . HLD (hyperlipidemia)   . Lipoma   . Mammographic  microcalcification 2013  . Melanoma ([place])    upper abdomin left SN removed under axilla  . Osteopenia 03/2015   DEXA hip -1.7, spine -1.7  . Plantar wart   . Schatzki's ring 07/23/08   EGD dilated O/W normal (Dr. Sharlett Iles)  . Shoulder pain, left   . 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 Vision abnormalities    Past Surgical History:  Procedure Laterality Date  . BREAST BIOPSY Right 07/2011   rec rpt 6 mo, neg  . [place]   [place]ree[place] presentation  . COLONOSCOPY  2010   [place], rec rpt 10 yrs  . ESOPHAGOGASTRODUODENOSCOPY  2010   [place], dilated schatzki ring  . EYE SURGERY Left 2012   cataract  . INTRAOCULAR LENS EXCHANGE Left 05/2017   uveitis-glaucoma-hypheme syndrome ([place])  . KNEE ARTHROSCOPY W/ MENISCAL REPAIR Left 01/2014   [place]  . MELANOMA EXCISION  2012   upper abdomen   Family History  Problem Relation Age of Onset  . Diabetes Mother   . Hyperlipidemia Mother   . Hypertension Mother   . Mental illness Mother        Personality disorder  . Kidney disease Father        failure, (Diaylsis)  . Diabetes Father   . Diabetes Brother   . Coronary artery disease Brother 64       CAD, hospitalized freq, PTCA  . Gout Brother   . Hypertension Brother   . Cancer Maternal Aunt 61  breast  . Breast cancer Maternal Aunt 70  . Cancer Paternal Aunt 86       breast  . Breast cancer Paternal Aunt 42  . ALS Paternal Aunt   . Stroke Paternal Grandfather   . Stroke Maternal Grandmother   . Breast cancer Maternal Aunt 70   Social History   Socioeconomic History  . Marital status: Married    Spouse name: Not on file  . Number of children: 1  . Years of education: Not on file  . Highest education level: Not on file  Occupational History  . Occupation: Retired    Fish farm manager: RETIRED    Comment: Investment banker, corporate  Social Needs  . Financial resource strain: Not on file  . Food insecurity:    Worry: Not on file    Inability: Not on  file  . Transportation needs:    Medical: Not on file    Non-medical: Not on file  Tobacco Use  . Smoking status: Never Smoker  . Smokeless tobacco: Never Used  Substance and Sexual Activity  . Alcohol use: Yes    Comment: occassionally  . Drug use: No  . Sexual activity: Yes  Lifestyle  . Physical activity:    Days per week: Not on file    Minutes per session: Not on file  . Stress: Not on file  Relationships  . Social connections:    Talks on phone: Not on file    Gets together: Not on file    Attends religious service: Not on file    Active member of club or organization: Not on file    Attends meetings of clubs or organizations: Not on file    Relationship status: Not on file  Other Topics Concern  . 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   Activity: Patient does not get regular exercise, has gym membership   Diet: good water, daily fruits/vegetables    Outpatient Encounter Medications as of 07/15/2018  Medication Sig  . amLODipine (NORVASC) 5 MG tablet Take 1 tablet (5 mg total) by mouth at bedtime.  . Ascorbic Acid (VITAMIN C PO) Take 1,000 mg by mouth daily.  . Calcium Carbonate-Vitamin D (CALCIUM-VITAMIN D3 PO) Take by mouth. 1200 mg of calcium; 1000 units of Vit D3  . Cholecalciferol (VITAMIN D3 PO) Take 5,000 Units by mouth daily.  . Colesevelam HCl 3.75 g PACK Take 3.75 g by mouth 3 (three) times a week.   . enalapril (VASOTEC) 20 MG tablet TAKE 1 TABLET BY MOUTH TWICE A DAY  . glipiZIDE (GLUCOTROL) 5 MG tablet Take 1 tablet (5 mg total) by mouth 2 (two) times daily before a meal.  . glucose blood (FREESTYLE LITE) test strip USE TO CHECK SUGAR ONCE DAILY AND AS NEEDED. DX:E11.65  . Insulin Pen Needle (BD PEN NEEDLE MICRO U/F) 32G X 6 MM MISC Use as directed once daliy  . LANTUS SOLOSTAR 100 UNIT/ML Solostar Pen  INJECT 15 UNITS INTO THE SKIN DAILY AT 10 PM. (Patient taking differently: 22 Units. )  . metFORMIN (GLUCOPHAGE-XR) 500 MG 24 hr tablet TAKE ONE TABLET IN THE AM AND TWO IN THE PM WITH MEALS  . MISC NATURAL PRODUCTS PO Take by mouth. CBD supplement 0.5 mg by mouth daily  . Multiple Vitamins-Minerals (CENTRUM SILVER PO) Take 1 tablet by mouth daily.  . Turmeric (  [employer] TURMERIC CURCUMIN) 500 MG CAPS Take 1 capsule by mouth daily.  Marland Kitchen VITAMIN K PO Take 100 mcg by mouth daily.  . [DISCONTINUED] cholecalciferol (VITAMIN D) 1000 units tablet Take 5,000 Units by mouth daily.    No facility-administered encounter medications on file as of 07/15/2018.     Activities of Daily Living In your present state of health, do you have any difficulty performing the following activities: 07/15/2018  Hearing? N  Vision? Y  Comment cataract in right eye  Difficulty concentrating or making decisions? N  Walking or climbing stairs? N  Dressing or bathing? N  Doing errands, shopping? N  Preparing Food and eating ? N  Using the Toilet? N  In the past six months, have you accidently leaked urine? N  Do you have problems with loss of bowel control? N  Managing your Medications? N  Managing your Finances? N  Housekeeping or managing your Housekeeping? N  Some recent data might be hidden    Patient Care Team: Ria Bush, MD as PCP - General (Family Medicine) Christene Lye, MD (General Surgery)    Assessment:   This is a routine wellness examination for Elizabeth Curtis.  Vision Screening Comments: Vision exam on 07/14/18 with Dr. Edison Pace  Exercise Activities and Dietary recommendations Current Exercise Habits: Home exercise routine, Type of exercise: walking, Time (Minutes): 35, Frequency (Times/Week): 7, Weekly Exercise (Minutes/Week): 245, Intensity: Mild, Exercise limited by: None identified  Goals    . Increase physical activity     When schedule permits, I will walk 1.5 miles daily.        Fall  Risk Fall Risk  07/15/2018 06/30/2017 04/29/2016 03/07/2015 03/07/2015  Falls in the past year? 0 No Yes Yes No  Number falls in past yr: - - 1 1 -  Injury with Fall? - - No Yes -  Risk for fall due to : - - - History of fall(s) -  Follow up - - - Education provided;Falls prevention discussed -  Comment - - - mechanical fall -   Depression Screen PHQ 2/9 Scores 07/15/2018 06/30/2017 04/29/2016 03/07/2015  PHQ - 2 Score 0 0 0 0  PHQ- 9 Score 0 - - -     Cognitive Function MMSE - Mini Mental State Exam 07/15/2018 04/29/2016  Orientation to time 5 5  Orientation to Place 5 5  Registration 3 3  Attention/ Calculation 0 0  Recall 3 3  Language- name 2 objects 0 0  Language- repeat 1 1  Language- follow 3 step command 0 3  Language- read & follow direction 0 0  Write a sentence 0 0  Copy design 0 0  Total score 17 20     PLEASE NOTE: A Mini-Cog screen was completed. Maximum score is 17. A value of 0 denotes this part of Folstein MMSE was not completed or the patient failed this part of the Mini-Cog screening.   Mini-Cog Screening Orientation to Time - Max 5 pts Orientation to Place - Max 5 pts Registration - Max 3 pts Recall - Max 3 pts Language Repeat - Max 1 pts      Immunization History  Administered Date(s) Administered  . Influenza Split 10/23/2010, 12/16/2011  . Influenza Whole 11/23/2007, 10/31/2008, 12/28/2009  . Influenza, High Dose Seasonal PF 12/24/2016, 12/02/2017  . Influenza,inj,Quad PF,6+ Mos 12/26/2012, 12/29/2013, 12/27/2014, 11/15/2015  . Pneumococcal Conjugate-13 03/07/2015  . Pneumococcal Polysaccharide-23 09/22/2011  . Td 02/28/1998, 05/09/2008    Screening Tests Health Maintenance  Topic  Date Due  . OPHTHALMOLOGY EXAM  02/09/2019 (Originally 04/10/2018)  . PNA vac Low Risk Adult (2 of 2 - PPSV23) 02/09/2019 (Originally 09/21/2016)  . DTaP/Tdap/Td (1 - Tdap) 02/09/2020 (Originally 01/11/1970)  . TETANUS/TDAP  02/09/2020 (Originally 05/10/2018)  . COLONOSCOPY   07/24/2018  . INFLUENZA VACCINE  09/10/2018  . FOOT EXAM  01/14/2019  . HEMOGLOBIN A1C  01/14/2019  . MAMMOGRAM  03/17/2019  . DEXA SCAN  Completed  . Hepatitis C Screening  Completed      Plan:     I have personally reviewed, addressed, and noted the following in the patient's chart:  A. Medical and social history B. Use of alcohol, tobacco or illicit drugs  C. Current medications and supplements D. Functional ability and status E.  Nutritional status F.  Physical activity G. Advance directives H. List of other physicians I.  Hospitalizations, surgeries, and ER visits in previous 12 months J.  Vitals (unless it is a telemedicine encounter) K. Screenings to include cognitive, depression, hearing, vision (NOTE: hearing and vision screenings not completed in telemedicine encounter) L. Referrals and appointments   In addition, I have reviewed and discussed with patient certain preventive protocols, quality metrics, and best practice recommendations. A written personalized care plan for preventive services and recommendations were provided to patient.  With patient's permission, we connected on 07/15/18 at 11:00 AM EDT by a video enabled telemedicine application. Two patient identifiers were used to ensure the encounter occurred with the correct person.    Patient was in home and writer was in office.   Signed,   Lindell Noe, MHA, BS, LPN Health Coach

## 2018-07-15 NOTE — Progress Notes (Signed)
PCP notes:   Health maintenance:  Eye exam - addressed PPSV23 - PCP follow-up requested Tetanus vaccine - postponed/insurance A1C - completed  Abnormal screenings:   None  Patient concerns:   None  Nurse concerns:  None  Next PCP appt:   07/26/18 @ 1030

## 2018-07-15 NOTE — Patient Instructions (Addendum)
Elizabeth Curtis , Thank you for taking time to come for your Medicare Wellness Visit. I appreciate your ongoing commitment to your health goals. Please review the following plan we discussed and let me know if I can assist you in the future.   These are the goals we discussed: Goals    . Increase physical activity     When schedule permits, I will walk 1.5 miles daily.        This is a list of the screening recommended for you and due dates:  Health Maintenance  Topic Date Due  . Eye exam for diabetics  02/09/2019*  . Pneumonia vaccines (2 of 2 - PPSV23) 02/09/2019*  . DTaP/Tdap/Td vaccine (1 - Tdap) 02/09/2020*  . Tetanus Vaccine  02/09/2020*  . Colon Cancer Screening  07/24/2018  . Flu Shot  09/10/2018  . Complete foot exam   01/14/2019  . Hemoglobin A1C  01/14/2019  . Mammogram  03/17/2019  . DEXA scan (bone density measurement)  Completed  .  Hepatitis C: One time screening is recommended by Center for Disease Control  (CDC) for  adults born from [birth year] through 1966.   Completed  *Topic was postponed. The date shown is not the original due date.   Preventive Care for Adults  A healthy lifestyle and preventive care can promote health and wellness. Preventive health guidelines for adults include the following key practices.  . A routine yearly physical is a good way to check with your health care provider about your health and preventive screening. It is a chance to share any concerns and updates on your health and to receive a thorough exam.  . Visit your dentist for a routine exam and preventive care every 6 months. Brush your teeth twice a day and floss once a day. Good oral hygiene prevents tooth decay and gum disease.  . The frequency of eye exams is based on your age, health, family medical history, use  of contact lenses, and other factors. Follow your health care provider's recommendations for frequency of eye exams.  . Eat a healthy diet. Foods like vegetables, fruits,  whole grains, low-fat dairy products, and lean protein foods contain the nutrients you need without too many calories. Decrease your intake of foods high in solid fats, added sugars, and salt. Eat the right amount of calories for you. Get information about a proper diet from your health care provider, if necessary.  . Regular physical exercise is one of the most important things you can do for your health. Most adults should get at least 150 minutes of moderate-intensity exercise (any activity that increases your heart rate and causes you to sweat) each week. In addition, most adults need muscle-strengthening exercises on 2 or more days a week.  Silver Sneakers may be a benefit available to you. To determine eligibility, you may visit the website: www.silversneakers.com or contact program at (516)514-5804 Mon-Fri between 8AM-8PM.   . Maintain a healthy weight. The body mass index (BMI) is a screening tool to identify possible weight problems. It provides an estimate of body fat based on height and weight. Your health care provider can find your BMI and can help you achieve or maintain a healthy weight.   For adults 20 years and older: ? A BMI below 18.5 is considered underweight. ? A BMI of 18.5 to 24.9 is normal. ? A BMI of 25 to 29.9 is considered overweight. ? A BMI of 30 and above is considered obese.   . Maintain  normal blood lipids and cholesterol levels by exercising and minimizing your intake of saturated fat. Eat a balanced diet with plenty of fruit and vegetables. Blood tests for lipids and cholesterol should begin at age 57 and be repeated every 5 years. If your lipid or cholesterol levels are high, you are over 50, or you are at high risk for heart disease, you may need your cholesterol levels checked more frequently. Ongoing high lipid and cholesterol levels should be treated with medicines if diet and exercise are not working.  . If you smoke, find out from your health care provider  how to quit. If you do not use tobacco, please do not start.  . If you choose to drink alcohol, please do not consume more than 2 drinks per day. One drink is considered to be 12 ounces (355 mL) of beer, 5 ounces (148 mL) of wine, or 1.5 ounces (44 mL) of liquor.  . If you are 26-75 years old, ask your health care provider if you should take aspirin to prevent strokes.  . Use sunscreen. Apply sunscreen liberally and repeatedly throughout the day. You should seek shade when your shadow is shorter than you. Protect yourself by wearing long sleeves, pants, a wide-brimmed hat, and sunglasses year round, whenever you are outdoors.  . Once a month, do a whole body skin exam, using a mirror to look at the skin on your back. Tell your health care provider of new moles, moles that have irregular borders, moles that are larger than a pencil eraser, or moles that have changed in shape or color.

## 2018-07-19 ENCOUNTER — Encounter: Payer: Self-pay | Admitting: Family Medicine

## 2018-07-25 ENCOUNTER — Encounter: Payer: Self-pay | Admitting: Family Medicine

## 2018-07-25 ENCOUNTER — Encounter: Payer: Self-pay | Admitting: Gastroenterology

## 2018-07-26 ENCOUNTER — Other Ambulatory Visit: Payer: Self-pay

## 2018-07-26 ENCOUNTER — Ambulatory Visit (INDEPENDENT_AMBULATORY_CARE_PROVIDER_SITE_OTHER): Payer: PPO | Admitting: Family Medicine

## 2018-07-26 ENCOUNTER — Encounter: Payer: Self-pay | Admitting: Family Medicine

## 2018-07-26 VITALS — BP 128/84 | HR 80 | Temp 97.1°F | Ht 68.5 in | Wt 168.5 lb

## 2018-07-26 DIAGNOSIS — Z0000 Encounter for general adult medical examination without abnormal findings: Secondary | ICD-10-CM

## 2018-07-26 DIAGNOSIS — R946 Abnormal results of thyroid function studies: Secondary | ICD-10-CM | POA: Diagnosis not present

## 2018-07-26 DIAGNOSIS — I10 Essential (primary) hypertension: Secondary | ICD-10-CM

## 2018-07-26 DIAGNOSIS — E782 Mixed hyperlipidemia: Secondary | ICD-10-CM | POA: Diagnosis not present

## 2018-07-26 DIAGNOSIS — Z85820 Personal history of malignant melanoma of skin: Secondary | ICD-10-CM | POA: Diagnosis not present

## 2018-07-26 DIAGNOSIS — E1165 Type 2 diabetes mellitus with hyperglycemia: Secondary | ICD-10-CM | POA: Diagnosis not present

## 2018-07-26 DIAGNOSIS — M25552 Pain in left hip: Secondary | ICD-10-CM | POA: Diagnosis not present

## 2018-07-26 DIAGNOSIS — M8580 Other specified disorders of bone density and structure, unspecified site: Secondary | ICD-10-CM

## 2018-07-26 DIAGNOSIS — M65341 Trigger finger, right ring finger: Secondary | ICD-10-CM

## 2018-07-26 MED ORDER — GLIPIZIDE 5 MG PO TABS: 5.0000 mg | ORAL_TABLET | Freq: Two times a day (BID) | ORAL | 3 refills | Status: DC

## 2018-07-26 MED ORDER — COLESEVELAM HCL 625 MG PO TABS: 625.0000 mg | ORAL_TABLET | ORAL | 3 refills | Status: DC

## 2018-07-26 MED ORDER — AMLODIPINE BESYLATE 5 MG PO TABS: 5.0000 mg | ORAL_TABLET | Freq: Every day | ORAL | 3 refills | Status: DC

## 2018-07-26 MED ORDER — BD PEN NEEDLE MICRO U/F 32G X 6 MM MISC: 3 refills | Status: DC

## 2018-07-26 MED ORDER — ENALAPRIL MALEATE 20 MG PO TABS: 20.0000 mg | ORAL_TABLET | Freq: Two times a day (BID) | ORAL | 3 refills | Status: DC

## 2018-07-26 MED ORDER — LANTUS SOLOSTAR 100 UNIT/ML ~~LOC~~ SOPN: 24.0000 [IU] | PEN_INJECTOR | Freq: Every day | SUBCUTANEOUS | 3 refills | Status: DC

## 2018-07-26 NOTE — Assessment & Plan Note (Signed)
Describes L lateral hip bursitis not improved with chiropractor treatment - supportive care reviewed, will send trochanteric bursitis handout with exercises. Discussed SM eval if ongoing.

## 2018-07-26 NOTE — Assessment & Plan Note (Signed)
Chronic. Statin intolerance. LDL above goal <100. She is on very low dose colesevelam 625mg  1 tab MWF. Did not try packs. Requests continued current dose. The 10-year ASCVD risk score Mikey Bussing DC Brooke Bonito., et al., 2013) is: 19.3%   Values used to calculate the score:     Age: 68 years     Sex: Female     Is Non-Hispanic African American: No     Diabetic: Yes     Tobacco smoker: No     Systolic Blood Pressure: 579 mmHg     Is BP treated: Yes     HDL Cholesterol: 40.1 mg/dL     Total Cholesterol: 171 mg/dL

## 2018-07-26 NOTE — Assessment & Plan Note (Signed)
Chronic, stable. Continue current regimen. 

## 2018-07-26 NOTE — Assessment & Plan Note (Signed)
TFTs remain normal.

## 2018-07-26 NOTE — Assessment & Plan Note (Signed)
Continues seeing derm yearly (Dr Evorn Gong).

## 2018-07-26 NOTE — Assessment & Plan Note (Signed)
Preventative protocols reviewed and updated unless pt declined. Discussed healthy diet and lifestyle.  

## 2018-07-26 NOTE — Telephone Encounter (Signed)
Spoke with patient today. 

## 2018-07-26 NOTE — Progress Notes (Signed)
Virtual visit completed through [facility]. Due to national recommendations of social distancing due to COVID-19, a virtual visit is felt to be most appropriate for this patient at this time. Reviewed limitations of a virtual visit.   Patient location: home Provider location: [place] at [facility], office If any vitals were documented, they were collected by patient at home unless specified below.    BP 128/84   Pulse 80   Temp (!) 97.1 F (36.2 C)   Ht 5' 8.5" (1.74 m)   Wt 168 lb 8 oz (76.4 kg)   SpO2 98%   BMI 25.25 kg/m    CC: CPE Subjective:    Patient ID: Elizabeth Curtis, female    DOB: [birth year], 68 y.o.   MRN: [id number]  HPI: Elizabeth Curtis is a 68 y.o. female presenting on 07/26/2018 for Annual Exam (Pt 2. BS fasing, 163 this morning. )   Saw Katha Cabal last week for medicare wellness visit. Note reviewed.   L eye uveitis glaucoma hyphema syndrome established with Dr Leanora Ivanoff at [facility]. She had intraocular lens replacement surgery.   DM - checks regularly - A few low readings recently, low to 68 more noticeable before lunch, otherwise elevated readings. On Metformin 500mg /1000mg , glipizide 5mg  bid, lantus 20 u at 10pm. Has not tolerated trulicity, avandia. H/o UTIs - SGLT2 likely not good option.  Lab Results  Component Value Date   HGBA1C 8.1 (H) 07/15/2018    Noticing increasing triggering of right 4th finger. Finds finger locks in extension.   Noticing increasing L hip pain worse when she wakes up in the morning - ongoing over last 3 months. Has seen chiropractor for this with mild improvement. Notes this on her daily walks. She will monitor for now - will mail hip bursal exercises. She has been trying CBD cream.   Preventative: Colonoscopy - 2010 [facility], 1 polyp, 46mm lipoma, o/w normal, rec rpt 10 yrs. Has received recall letter.  Well woman at [facility],Dr Ward 04/2018. Pap normal per patient. LMP - 2010.Menopausal.  Mammogram - Bir[place]1 03/2018  DEXA2/15/17  Osteopenia-not regular withcalciumorvit D, hasn't beenwalking regularly.  Flu shot yearly. Tetanus 2000, 2010. Pneumovax 09/2011 (felt sick afterwards). Prevnar 02/2015. Due for pneumovax - declines today.  Shingrix - discussed,interested  Advanced directives: living will scanned into chart 2017. Husband Chrissie Noa then son Gerald Stabs are [place]. Does not want prolonged life support if terminally or incurably ill. Seat belt use discussed  Sunscreen use discussed, no changing skin lesions. H/o melanoma 2012, sees Dr Evorn Gong derm yearly  Non smoker Alcohol - rare Dentist Q6 months  Eye doctor recently  Bowel - no constipation Bladder - some stress incontinence - she can wear pad.   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  Activity:2.5 mi most days walking Diet: good water, daily fruits/vegetables     Relevant past medical, surgical, family and social history reviewed and updated as indicated. Interim medical history since our last visit reviewed. Allergies and medications reviewed and updated. Outpatient Medications Prior to Visit  Medication Sig Dispense Refill  . Ascorbic Acid (VITAMIN C PO) Take 1,000 mg by mouth daily.    . Calcium Carbonate-Vitamin D (CALCIUM-VITAMIN D3 PO) Take by mouth. 1200 mg of calcium; 1000 units of Vit D3    . Cholecalciferol (VITAMIN D3 PO) Take 5,000 Units by mouth daily.    Marland Kitchen glucose blood (FREESTYLE LITE) test strip USE TO CHECK  SUGAR ONCE DAILY AND AS NEEDED. DX:E11.65 100 each 3  . metFORMIN (GLUCOPHAGE-XR) 500 MG 24 hr tablet TAKE ONE TABLET IN THE AM AND TWO IN THE PM WITH MEALS 270 tablet 1  . MISC NATURAL PRODUCTS PO Take by mouth. CBD supplement 0.5 mg by mouth daily    . Multiple Vitamins-Minerals (CENTRUM SILVER PO) Take 1 tablet by mouth daily.    . Turmeric ([employer]S TURMERIC CURCUMIN) 500 MG CAPS Take 1 capsule by mouth daily.    Marland Kitchen VITAMIN K PO  Take 100 mcg by mouth daily.    Marland Kitchen amLODipine (NORVASC) 5 MG tablet Take 1 tablet (5 mg total) by mouth at bedtime. 90 tablet 3  . Colesevelam HCl 3.75 g PACK Take 3.75 g by mouth 3 (three) times a week.     . enalapril (VASOTEC) 20 MG tablet TAKE 1 TABLET BY MOUTH TWICE A DAY 180 tablet 0  . glipiZIDE (GLUCOTROL) 5 MG tablet Take 1 tablet (5 mg total) by mouth 2 (two) times daily before a meal. 180 tablet 3  . Insulin Pen Needle (BD PEN NEEDLE MICRO U/F) 32G X 6 MM MISC Use as directed once daliy 100 each 3  . LANTUS SOLOSTAR 100 UNIT/ML Solostar Pen INJECT 15 UNITS INTO THE SKIN DAILY AT 10 PM. (Patient taking differently: 22 Units. ) 15 mL 3   No facility-administered medications prior to visit.      Per HPI unless specifically indicated in ROS section below Review of Systems  Constitutional: Negative for activity change, appetite change, chills, fatigue, fever and unexpected weight change.  HENT: Negative for hearing loss.   Eyes: Negative for visual disturbance.  Respiratory: Positive for cough (chronic). Negative for chest tightness, shortness of breath and wheezing.   Cardiovascular: Positive for chest pain (heartburn related) and palpitations (few). Negative for leg swelling.  Gastrointestinal: Positive for diarrhea (sometimes when eating out). Negative for abdominal distention, abdominal pain, blood in stool, constipation, nausea and vomiting.  Genitourinary: Negative for difficulty urinating and hematuria.  Musculoskeletal: Negative for arthralgias, myalgias and neck pain.  Skin: Negative for rash.  Neurological: Positive for dizziness. Negative for seizures, syncope and headaches.  Hematological: Negative for adenopathy. Does not bruise/bleed easily.  Psychiatric/Behavioral: Negative for dysphoric mood. The patient is not nervous/anxious.    Objective:    BP 128/84   Pulse 80   Temp (!) 97.1 F (36.2 C)   Ht 5' 8.5" (1.74 m)   Wt 168 lb 8 oz (76.4 kg)   SpO2 98%   BMI  25.25 kg/m   Wt Readings from Last 3 Encounters:  07/26/18 168 lb 8 oz (76.4 kg)  07/15/18 168 lb (76.2 kg)  01/13/18 172 lb (78 kg)     Physical exam: Gen: alert, NAD, not ill appearing Pulm: speaks in complete sentences without increased work of breathing Psych: normal mood, normal thought content      Results for orders placed or performed in visit on 07/19/18  HM DIABETES EYE EXAM  Result Value Ref Range   HM Diabetic Eye Exam No Retinopathy No Retinopathy   Lab Results  Component Value Date   CHOL 171 07/15/2018   HDL 40.10 07/15/2018   LDLCALC 102 (H) 07/15/2018   LDLDIRECT 122.0 12/23/2015   TRIG 147.0 07/15/2018   CHOLHDL 4 07/15/2018    Lab Results  Component Value Date   HGBA1C 8.1 (H) 07/15/2018    Lab Results  Component Value Date   CREATININE 0.68 07/15/2018   BUN  14 07/15/2018   NA 141 07/15/2018   K 4.1 07/15/2018   CL 102 07/15/2018   CO2 30 07/15/2018    Assessment & Plan:   Problem List Items Addressed This Visit    Trigger finger of right hand    Ongoing, offered hand surgery or sports med eval.       Osteopenia    Encouraged regular weight bearing exercise as well as cal/vit D intake.       Left hip pain    Describes L lateral hip bursitis not improved with chiropractor treatment - supportive care reviewed, will send trochanteric bursitis handout with exercises. Discussed SM eval if ongoing.       HLD (hyperlipidemia)    Chronic. Statin intolerance. LDL above goal <100. She is on very low dose colesevelam 625mg  1 tab MWF. Did not try packs. Requests continued current dose. The 10-year ASCVD risk score Mikey Bussing DC Brooke Bonito., et al., 2013) is: 19.3%   Values used to calculate the score:     Age: 57 years     Sex: Female     Is Non-Hispanic African American: No     Diabetic: Yes     Tobacco smoker: No     Systolic Blood Pressure: 702 mmHg     Is BP treated: Yes     HDL Cholesterol: 40.1 mg/dL     Total Cholesterol: 171 mg/dL        Relevant Medications   amLODipine (NORVASC) 5 MG tablet   enalapril (VASOTEC) 20 MG tablet   colesevelam (WELCHOL) 625 MG tablet (Start on 07/27/2018)   History of malignant melanoma    Continues seeing derm yearly (Dr Evorn Gong).       Healthcare maintenance - Primary    Preventative protocols reviewed and updated unless pt declined. Discussed healthy diet and lifestyle.       Essential hypertension, benign    Chronic, stable. Continue current regimen.       Relevant Medications   amLODipine (NORVASC) 5 MG tablet   enalapril (VASOTEC) 20 MG tablet   colesevelam (WELCHOL) 625 MG tablet (Start on 07/27/2018)   Diabetes mellitus type 2, uncontrolled (HCC)    Chronic, deteriorated. Stress likely contributing.  rec increase lantus to 24 u nightly and continued metformin, with option to decrease glipizide to 5mg  nightly if noticing ongoing am/lunch hypoglycemia.       Relevant Medications   enalapril (VASOTEC) 20 MG tablet   glipiZIDE (GLUCOTROL) 5 MG tablet   Insulin Glargine (LANTUS SOLOSTAR) 100 UNIT/ML Solostar Pen   Abnormal thyroid function test    TFTs remain normal.           Meds ordered this encounter  Medications  . amLODipine (NORVASC) 5 MG tablet    Sig: Take 1 tablet (5 mg total) by mouth at bedtime.    Dispense:  90 tablet    Refill:  3  . enalapril (VASOTEC) 20 MG tablet    Sig: Take 1 tablet (20 mg total) by mouth 2 (two) times daily.    Dispense:  180 tablet    Refill:  3  . glipiZIDE (GLUCOTROL) 5 MG tablet    Sig: Take 1 tablet (5 mg total) by mouth 2 (two) times daily before a meal.    Dispense:  180 tablet    Refill:  3  . Insulin Glargine (LANTUS SOLOSTAR) 100 UNIT/ML Solostar Pen    Sig: Inject 24 Units into the skin daily.    Dispense:  30 mL  Refill:  3  . colesevelam (WELCHOL) 625 MG tablet    Sig: Take 1 tablet (625 mg total) by mouth every Monday, Wednesday, and Friday.    Dispense:  30 tablet    Refill:  3  . Insulin Pen Needle (BD PEN  NEEDLE MICRO U/F) 32G X 6 MM MISC    Sig: Use as directed once daliy    Dispense:  100 each    Refill:  3   No orders of the defined types were placed in this encounter.   I discussed the assessment and treatment plan with the patient. The patient was provided an opportunity to ask questions and all were answered. The patient agreed with the plan and demonstrated an understanding of the instructions. The patient was advised to call back or seek an in-person evaluation if the symptoms worsen or if the condition fails to improve as anticipated.  Follow up plan: No follow-ups on file.  Ria Bush, MD

## 2018-07-26 NOTE — Assessment & Plan Note (Addendum)
Ongoing, offered hand surgery or sports med eval.

## 2018-07-26 NOTE — Assessment & Plan Note (Signed)
Encouraged regular weight bearing exercise as well as cal/vit D intake.

## 2018-07-26 NOTE — Assessment & Plan Note (Signed)
Chronic, deteriorated. Stress likely contributing.  rec increase lantus to 24 u nightly and continued metformin, with option to decrease glipizide to 5mg  nightly if noticing ongoing am/lunch hypoglycemia.

## 2018-07-29 ENCOUNTER — Encounter: Payer: Self-pay | Admitting: Family Medicine

## 2018-07-29 MED ORDER — METFORMIN HCL ER 500 MG PO TB24: ORAL_TABLET | ORAL | 1 refills | Status: DC

## 2018-07-29 NOTE — Telephone Encounter (Signed)
E-scribed metformin refill. Notified pt via [facility].

## 2018-08-03 DIAGNOSIS — H26491 Other secondary cataract, right eye: Secondary | ICD-10-CM | POA: Diagnosis not present

## 2018-08-08 NOTE — Progress Notes (Signed)
I reviewed health advisor's note, was available for consultation, and agree with documentation and plan.  

## 2018-08-16 ENCOUNTER — Encounter: Payer: Self-pay | Admitting: Family Medicine

## 2018-08-18 NOTE — Telephone Encounter (Signed)
Dawn, can you look at this and see if there is something we can change the code to per Dr Synthia Innocent response below? DOS 6/16

## 2018-08-18 NOTE — Telephone Encounter (Signed)
plz check into HTA denial of comprehensive physical done last month. Don't see why it would be deined. Sending to [place].

## 2018-09-06 ENCOUNTER — Encounter: Payer: Self-pay | Admitting: Family Medicine

## 2018-11-01 ENCOUNTER — Other Ambulatory Visit: Payer: Self-pay

## 2018-11-01 ENCOUNTER — Ambulatory Visit (INDEPENDENT_AMBULATORY_CARE_PROVIDER_SITE_OTHER): Payer: PPO | Admitting: Family Medicine

## 2018-11-01 ENCOUNTER — Encounter: Payer: Self-pay | Admitting: Family Medicine

## 2018-11-01 VITALS — BP 122/84 | HR 83 | Temp 97.8°F | Ht 68.5 in | Wt 163.2 lb

## 2018-11-01 DIAGNOSIS — H918X9 Other specified hearing loss, unspecified ear: Secondary | ICD-10-CM | POA: Insufficient documentation

## 2018-11-01 DIAGNOSIS — E1165 Type 2 diabetes mellitus with hyperglycemia: Secondary | ICD-10-CM

## 2018-11-01 DIAGNOSIS — IMO0001 Reserved for inherently not codable concepts without codable children: Secondary | ICD-10-CM | POA: Insufficient documentation

## 2018-11-01 DIAGNOSIS — H9192 Unspecified hearing loss, left ear: Secondary | ICD-10-CM | POA: Diagnosis not present

## 2018-11-01 DIAGNOSIS — H918X3 Other specified hearing loss, bilateral: Secondary | ICD-10-CM | POA: Insufficient documentation

## 2018-11-01 LAB — POCT GLYCOSYLATED HEMOGLOBIN (HGB A1C): Hemoglobin A1C: 6.5 % — AB (ref 4.0–5.6)

## 2018-11-01 NOTE — Patient Instructions (Addendum)
Flu shot in October.  Good to see you today  Congratulations on better sugar control! If ongoing low sugars, decrease glipizide to once daily.  Return in 4-6 months for diabetes follow up visit.

## 2018-11-01 NOTE — Assessment & Plan Note (Signed)
Chronic, marked improvement. Congratulated. She is motivated to continue healthy diet and lifestyle changes. Continue current regimen, discussed decreasing glipizide to once daily if hypoglycemic symptoms persist.

## 2018-11-01 NOTE — Assessment & Plan Note (Addendum)
She will call for audiology appt Declines referral today

## 2018-11-01 NOTE — Progress Notes (Signed)
This visit was conducted in person.  BP 122/84 (BP Location: Left Arm, Patient Position: Sitting, Cuff Size: Normal)   Pulse 83   Temp 97.8 F (36.6 C) (Temporal)   Ht 5' 8.5" (1.74 m)   Wt 163 lb 3 oz (74 kg)   SpO2 98%   BMI 24.45 kg/m    CC: 3 mo f/u visit Subjective:    Patient ID: Elizabeth Curtis, female    DOB: 1950-10-11, 68 y.o.   MRN: [id number]  HPI: Elizabeth Curtis is a 68 y.o. female presenting on 11/01/2018 for Diabetes (Here for 3 mo f/u.)   DM - does not regularly check sugars. Compliant with antihyperglycemic regimen which includes: metformin XR 500/1000mg , lantus 24u daily (recent increase - but she dropped back to 20 units due to hypoglycemic symptoms), glipizide 5mg  bid. Has not tolerated trulicity or avandia. H/o UTIs - likely SGLT2-I poor option. Some hypoglycemic symptoms. Denies paresthesias. Last diabetic eye exam 07/2018. Pneumovax: 2013. Prevnar: 2017. Glucometer brand: freestyle lite. DSME: completed remotely [facility]).  Lab Results  Component Value Date   HGBA1C 6.5 (A) 11/01/2018   Diabetic Foot Exam - Simple   Simple Foot Form Diabetic Foot exam was performed with the following findings: Yes 11/01/2018  8:24 AM  Visual Inspection No deformities, no ulcerations, no other skin breakdown bilaterally: Yes Sensation Testing Intact to touch and monofilament testing bilaterally: Yes Pulse Check Posterior Tibialis and Dorsalis pulse intact bilaterally: Yes Comments    Lab Results  Component Value Date   MICROALBUR <0.7 03/01/2015     She has just finished reading "life without diabetes" by Dr Harriet Butte. Working on weight loss through decreased portion sizes as well as limiting junk food and increasing water. Walking regularly around neighborhood.   New worsening L sided hearing loss - will call for audiology appt and let us know if needs referral.      Relevant past medical, surgical, family and social history reviewed and updated as  indicated. Interim medical history since our last visit reviewed. Allergies and medications reviewed and updated. Outpatient Medications Prior to Visit  Medication Sig Dispense Refill  . amLODipine (NORVASC) 5 MG tablet Take 1 tablet (5 mg total) by mouth at bedtime. 90 tablet 3  . Calcium Carbonate-Vitamin D (CALCIUM-VITAMIN D3 PO) Take by mouth. 1200 mg of calcium; 1000 units of Vit D3    . Cholecalciferol (VITAMIN D3 PO) Take 5,000 Units by mouth daily.    . colesevelam (WELCHOL) 625 MG tablet Take 1 tablet (625 mg total) by mouth every Monday, Wednesday, and Friday. 30 tablet 3  . enalapril (VASOTEC) 20 MG tablet Take 1 tablet (20 mg total) by mouth 2 (two) times daily. 180 tablet 3  . glipiZIDE (GLUCOTROL) 5 MG tablet Take 1 tablet (5 mg total) by mouth 2 (two) times daily before a meal. 180 tablet 3  . glucose blood (FREESTYLE LITE) test strip USE TO CHECK SUGAR ONCE DAILY AND AS NEEDED. DX:E11.65 100 each 3  . Insulin Glargine (LANTUS SOLOSTAR) 100 UNIT/ML Solostar Pen Inject 20 Units into the skin daily.    . Insulin Pen Needle (BD PEN NEEDLE MICRO U/F) 32G X 6 MM MISC Use as directed once daliy 100 each 3  . metFORMIN (GLUCOPHAGE-XR) 500 MG 24 hr tablet TAKE 1 (ONE) TABLET IN AM AND 2 (TWO) TABLETS IN PM WITH MEALS 270 tablet 1  . MISC NATURAL PRODUCTS PO Take by mouth. CBD supplement 0.5 mg by mouth daily    .  Multiple Vitamins-Minerals (CENTRUM SILVER PO) Take 1 tablet by mouth daily.    . Turmeric (CVS TURMERIC CURCUMIN) 500 MG CAPS Take 1 capsule by mouth daily.    Marland Kitchen VITAMIN K PO Take 100 mcg by mouth daily.    . Insulin Glargine (LANTUS SOLOSTAR) 100 UNIT/ML Solostar Pen Inject 24 Units into the skin daily. 30 mL 3  . Ascorbic Acid (VITAMIN C PO) Take 1,000 mg by mouth daily.     No facility-administered medications prior to visit.      Per HPI unless specifically indicated in ROS section below Review of Systems Objective:    BP 122/84 (BP Location: Left Arm, Patient  Position: Sitting, Cuff Size: Normal)   Pulse 83   Temp 97.8 F (36.6 C) (Temporal)   Ht 5' 8.5" (1.74 m)   Wt 163 lb 3 oz (74 kg)   SpO2 98%   BMI 24.45 kg/m   Wt Readings from Last 3 Encounters:  11/01/18 163 lb 3 oz (74 kg)  07/26/18 168 lb 8 oz (76.4 kg)  07/15/18 168 lb (76.2 kg)    Physical Exam Vitals signs and nursing note reviewed.  Constitutional:      General: She is not in acute distress.    Appearance: She is not ill-appearing.  HENT:     Right Ear: Tympanic membrane, ear canal and external ear normal.     Left Ear: Tympanic membrane, ear canal and external ear normal.  Musculoskeletal: Normal range of motion.     Right lower leg: No edema.     Left lower leg: No edema.     Comments: See HPI for foot exam if done  Neurological:     Mental Status: She is alert.       Results for orders placed or performed in visit on 11/01/18  POCT glycosylated hemoglobin (Hb A1C)  Result Value Ref Range   Hemoglobin A1C 6.5 (A) 4.0 - 5.6 %   HbA1c POC (<> result, manual entry)     HbA1c, POC (prediabetic range)     HbA1c, POC (controlled diabetic range)     Lab Results  Component Value Date   CHOL 171 07/15/2018   HDL 40.10 07/15/2018   LDLCALC 102 (H) 07/15/2018   LDLDIRECT 122.0 12/23/2015   TRIG 147.0 07/15/2018   CHOLHDL 4 07/15/2018    Lab Results  Component Value Date   CREATININE 0.68 07/15/2018   BUN 14 07/15/2018   NA 141 07/15/2018   K 4.1 07/15/2018   CL 102 07/15/2018   CO2 30 07/15/2018    Assessment & Plan:   Problem List Items Addressed This Visit    Hearing loss of left ear    She will call for audiology appt Declines referral today      Diabetes mellitus type 2, uncontrolled ([place]) - Primary    Chronic, marked improvement. Congratulated. She is motivated to continue healthy diet and lifestyle changes. Continue current regimen, discussed decreasing glipizide to once daily if hypoglycemic symptoms persist.       Relevant Medications    Insulin Glargine (LANTUS SOLOSTAR) 100 UNIT/ML Solostar Pen   Other Relevant Orders   POCT glycosylated hemoglobin (Hb A1C) (Completed)       No orders of the defined types were placed in this encounter.  Orders Placed This Encounter  Procedures  . POCT glycosylated hemoglobin (Hb A1C)    Follow up plan: Return in about 4 months (around 03/03/2019) for follow up visit.  Ria Bush, MD

## 2018-11-11 DIAGNOSIS — H903 Sensorineural hearing loss, bilateral: Secondary | ICD-10-CM | POA: Diagnosis not present

## 2018-11-11 DIAGNOSIS — H9313 Tinnitus, bilateral: Secondary | ICD-10-CM | POA: Insufficient documentation

## 2018-11-11 DIAGNOSIS — H918X2 Other specified hearing loss, left ear: Secondary | ICD-10-CM | POA: Diagnosis not present

## 2018-11-17 ENCOUNTER — Other Ambulatory Visit: Payer: Self-pay | Admitting: Otolaryngology

## 2018-11-17 DIAGNOSIS — H918X2 Other specified hearing loss, left ear: Secondary | ICD-10-CM

## 2018-11-17 DIAGNOSIS — IMO0001 Reserved for inherently not codable concepts without codable children: Secondary | ICD-10-CM

## 2018-12-07 ENCOUNTER — Other Ambulatory Visit: Payer: Self-pay

## 2018-12-07 ENCOUNTER — Ambulatory Visit
Admission: RE | Admit: 2018-12-07 | Discharge: 2018-12-07 | Disposition: A | Discharge status: Home / Self Care | Payer: PPO | Source: Ambulatory Visit | Attending: Otolaryngology | Admitting: Otolaryngology

## 2018-12-07 DIAGNOSIS — H918X2 Other specified hearing loss, left ear: Secondary | ICD-10-CM

## 2018-12-07 DIAGNOSIS — IMO0001 Reserved for inherently not codable concepts without codable children: Secondary | ICD-10-CM

## 2018-12-07 DIAGNOSIS — H9192 Unspecified hearing loss, left ear: Secondary | ICD-10-CM | POA: Diagnosis not present

## 2018-12-07 IMAGING — MR MR HEAD WO/W CM
11 of 12 series · 37 of 48 positions shown · IV contrast (multihance)
Comparison: [DATE]

CLINICAL DATA: Left asymmetric hearing loss.

Creatinine was obtained on site at [HOSPITAL] at [HOSPITAL].
Results: Creatinine 0.8 mg/dL.
EXAM:
MRI HEAD WITHOUT AND WITH CONTRAST
TECHNIQUE: Multiplanar, multiecho pulse sequences of the brain and surrounding
structures were obtained without and with intravenous contrast.
CONTRAST:  15mL MULTIHANCE GADOBENATE DIMEGLUMINE 529 MG/ML IV SOLN

[Series 2: T1 · sagittal · 5.0mm · 0.45mm/px · 3 of 21 slices shown (1 of 4)]
[im 1/21]
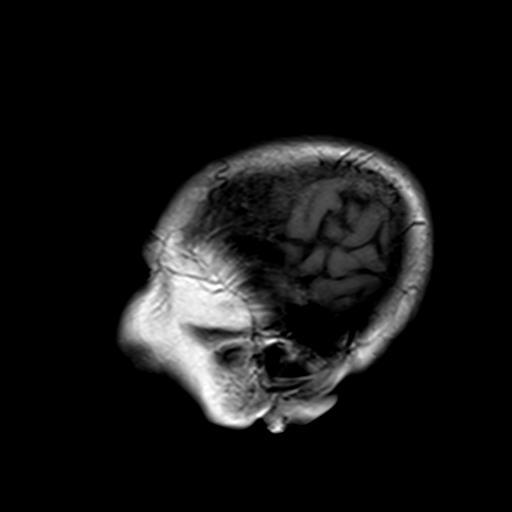
[im 11/21]
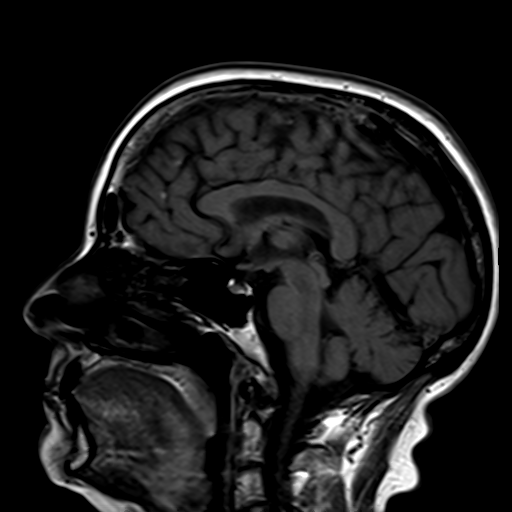
[im 21/21]
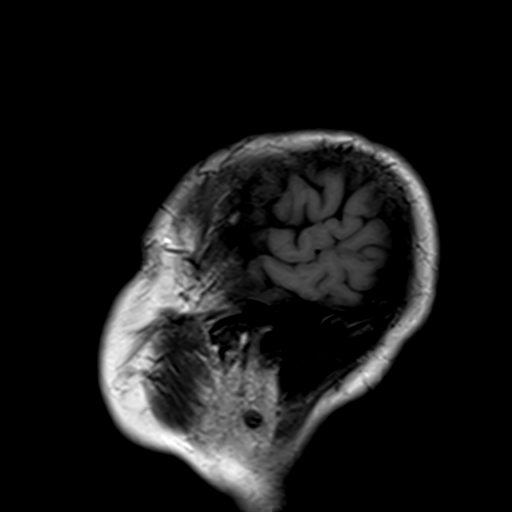

[Series 3: ep2d_diff_3 · axial · 3.0mm · 1.80mm/px · z∈[-74,+67]mm · 8 of 97 slices shown]
[im 1/97]
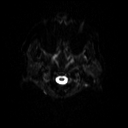
[im 11/97]
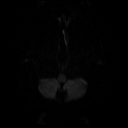
[im 33/97]
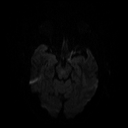
[im 43/97]
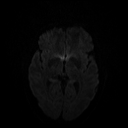
[im 54/97]
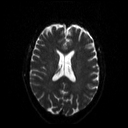
[im 65/97]
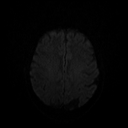
[im 86/97]
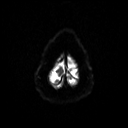
[im 97/97]
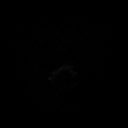

[Series 4: ep2d_diff_3_adc · axial · 3.0mm · 1.80mm/px · z∈[-74,+67]mm · 5 of 50 slices shown]
[im 1/50]
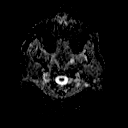
[im 13/50]
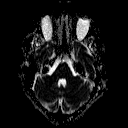
[im 25/50]
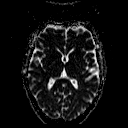
[im 37/50]
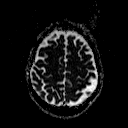
[im 50/50]
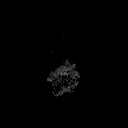

[Series 5: t2_tse_tra_512 · axial · 5.0mm · 0.60mm/px · z∈[-73,+66]mm · 2 of 24 slices shown]
[im 1/24]
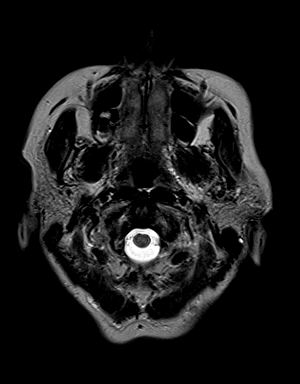
[im 24/24]
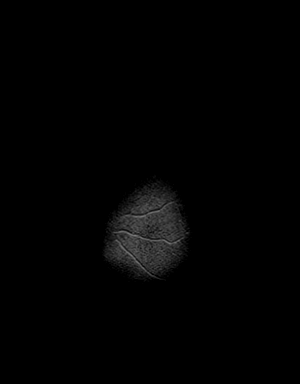

[Series 7: swi_images · axial · 2.0mm · 0.90mm/px · z∈[-79,+73]mm · 8 of 80 slices shown]
[im 1/80]
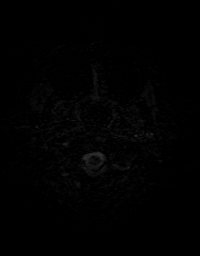
[im 12/80]
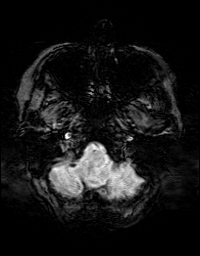
[im 23/80]
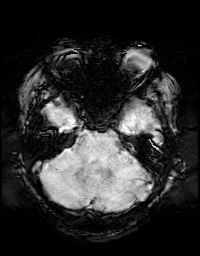
[im 34/80]
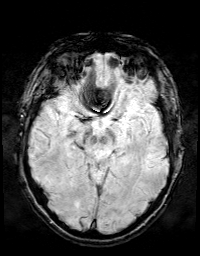
[im 46/80]
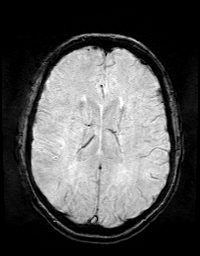
[im 57/80]
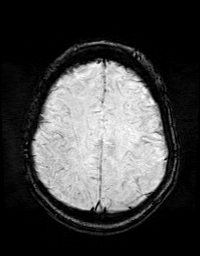
[im 68/80]
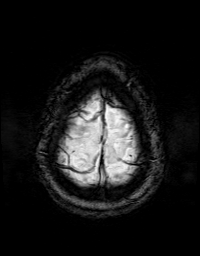
[im 80/80]
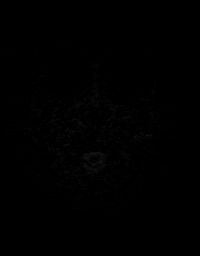

[Series 8: T1 · coronal · 3.0mm · 0.35mm/px · 1 of 11 slices shown (2 of 4)]
[im 1/11]
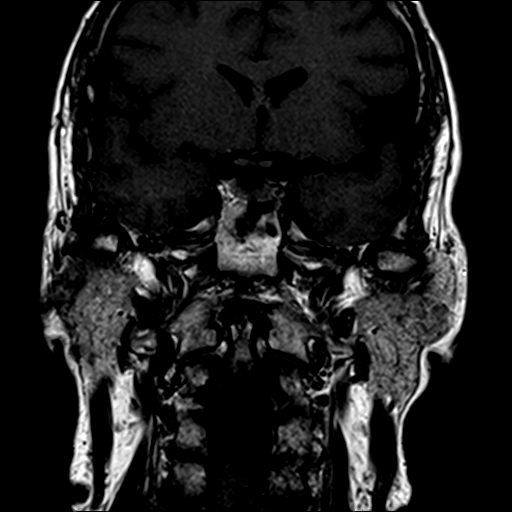

[Series 9: FLAIR · axial · 3.0mm · 0.43mm/px · z∈[-80,+70]mm · 3 of 27 slices shown]
[im 1/27]
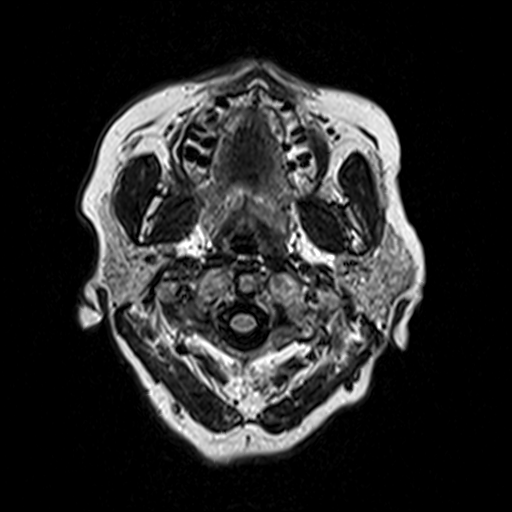
[im 14/27]
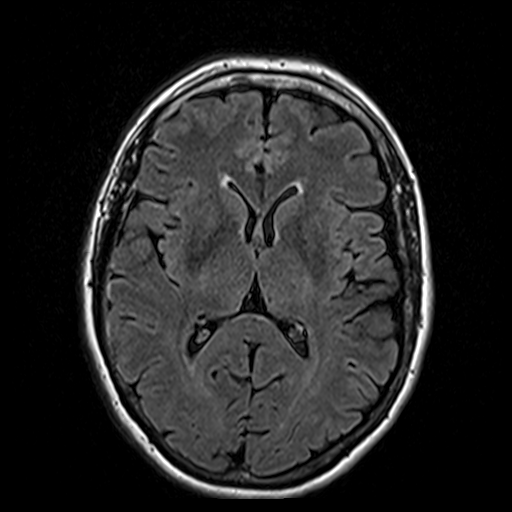
[im 27/27]
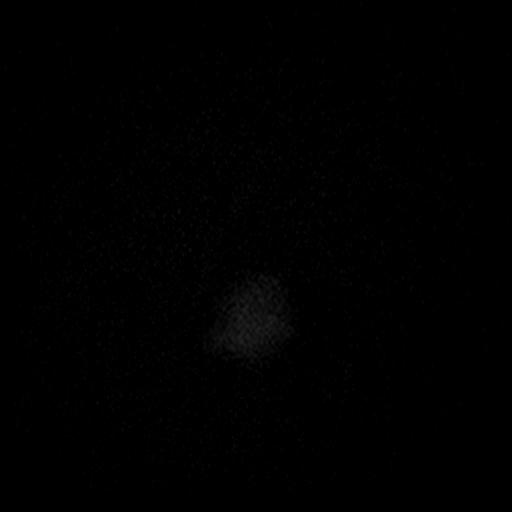

[Series 10: T1 · axial · 3.0mm · 0.35mm/px · 1 of 11 slices shown (3 of 4)]
[im 1/11]
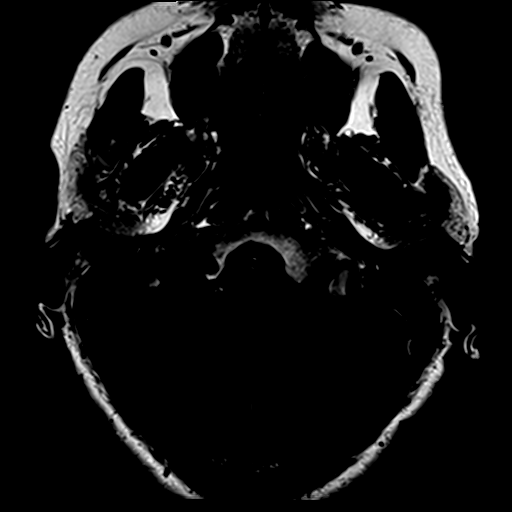

[Series 11: bSSFP · axial · 0.7mm · 0.31mm/px · z∈[-88,-65]mm · 4 of 44 slices shown]
[im 1/44]
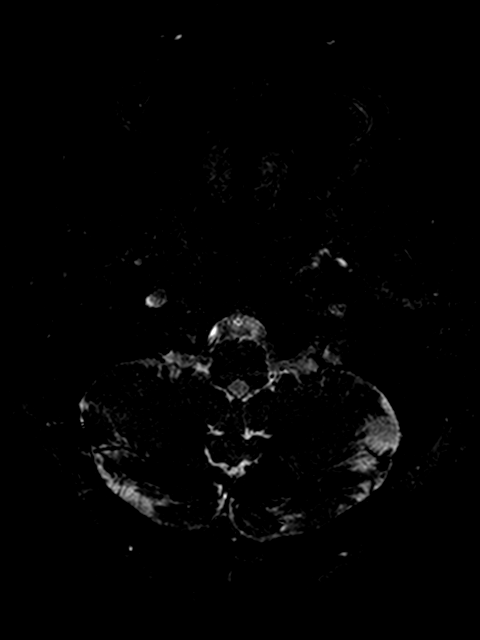
[im 11/44]
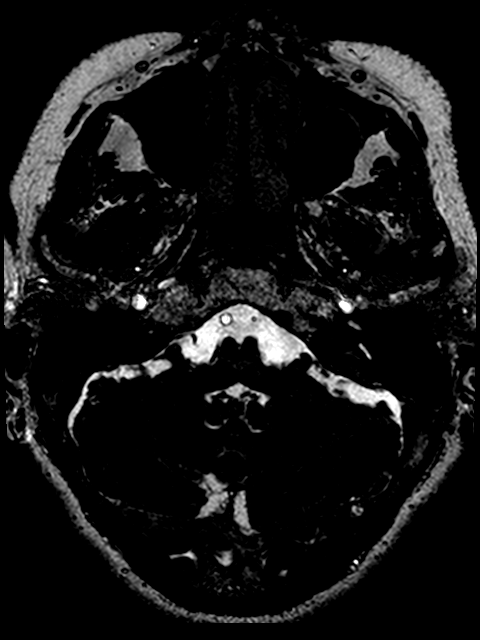
[im 22/44]
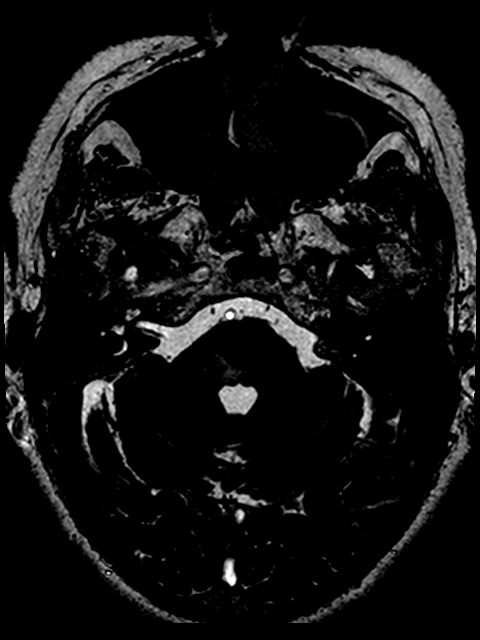
[im 33/44]
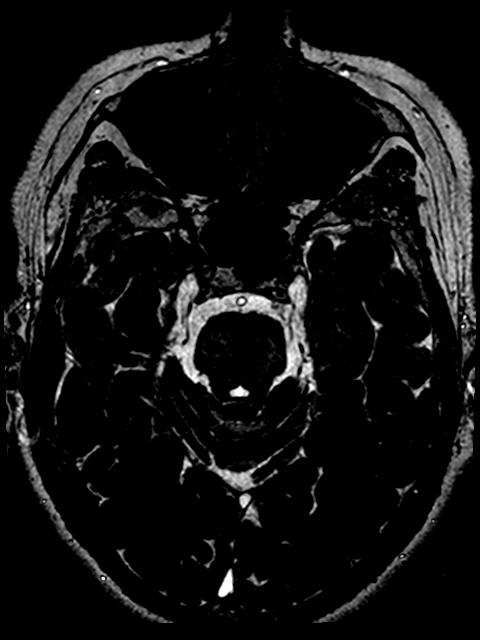

[Series 12: T1 · coronal · 3.0mm · 0.35mm/px · 1 of 11 slices shown (4 of 4)]
[im 1/11]
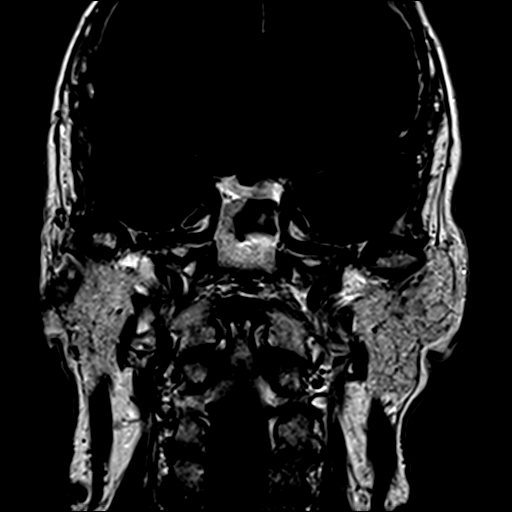

[Series 13: T1 post-contrast · axial · 3.0mm · 0.35mm/px · 1 of 11 slices shown]
[im 1/11]
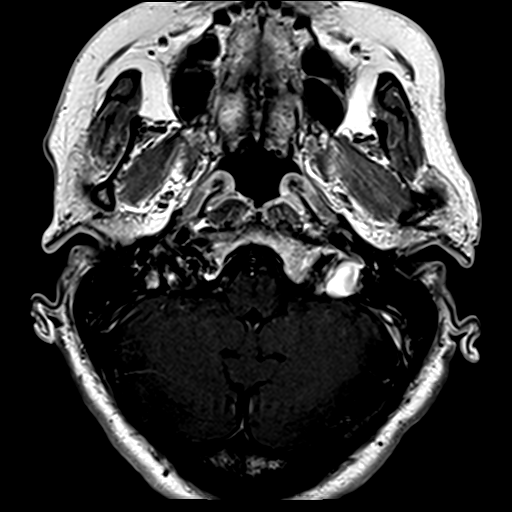

[37 of 48 positions shown; findings below may reference images not displayed]

FINDINGS: Brain: There is no evidence of acute infarct, intracranial
hemorrhage, mass, midline shift, or extra-axial fluid collection.
The ventricles and sulci are within normal limits for age. A few
small foci of T2 hyperintensity in the cerebral white matter are
nonspecific and not considered abnormal for age. No abnormal
enhancement is identified.

Dedicated imaging through the internal auditory canals demonstrates
a normal course of cranial nerves VII and VIII without evidence of
mass or abnormal enhancement. Inner ear structures demonstrate
normal signal bilaterally. No mass is seen within the
cerebellopontine angles.

Vascular: Major intracranial vascular flow voids are preserved.

Skull and upper cervical spine: Unremarkable bone marrow signal.

Sinuses/Orbits: Bilateral cataract extraction. Trace right mastoid
effusion. Clear paranasal sinuses.

Other: None.
IMPRESSION: 1. Negative internal auditory canal imaging. No retrocochlear
lesion.
2. Unremarkable appearance of the brain for age.

## 2018-12-07 MED ORDER — GADOBENATE DIMEGLUMINE 529 MG/ML IV SOLN
15.0000 mL | Freq: Once | INTRAVENOUS | Status: AC | PRN
  Administered 2018-12-07: 15 mL via INTRAVENOUS

## 2018-12-09 DIAGNOSIS — Z08 Encounter for follow-up examination after completed treatment for malignant neoplasm: Secondary | ICD-10-CM | POA: Diagnosis not present

## 2018-12-09 DIAGNOSIS — L570 Actinic keratosis: Secondary | ICD-10-CM | POA: Diagnosis not present

## 2018-12-09 DIAGNOSIS — D2371 Other benign neoplasm of skin of right lower limb, including hip: Secondary | ICD-10-CM | POA: Diagnosis not present

## 2018-12-09 DIAGNOSIS — L821 Other seborrheic keratosis: Secondary | ICD-10-CM | POA: Diagnosis not present

## 2018-12-09 DIAGNOSIS — D2372 Other benign neoplasm of skin of left lower limb, including hip: Secondary | ICD-10-CM | POA: Diagnosis not present

## 2018-12-09 DIAGNOSIS — X32XXXA Exposure to sunlight, initial encounter: Secondary | ICD-10-CM | POA: Diagnosis not present

## 2018-12-09 DIAGNOSIS — Z85820 Personal history of malignant melanoma of skin: Secondary | ICD-10-CM | POA: Diagnosis not present

## 2019-02-13 ENCOUNTER — Ambulatory Visit (INDEPENDENT_AMBULATORY_CARE_PROVIDER_SITE_OTHER): Payer: PPO | Admitting: Family Medicine

## 2019-02-13 ENCOUNTER — Other Ambulatory Visit: Payer: Self-pay

## 2019-02-13 ENCOUNTER — Encounter: Payer: Self-pay | Admitting: Family Medicine

## 2019-02-13 VITALS — BP 140/80 | HR 72 | Temp 97.6°F | Ht 68.5 in | Wt 163.5 lb

## 2019-02-13 DIAGNOSIS — H6993 Unspecified Eustachian tube disorder, bilateral: Secondary | ICD-10-CM

## 2019-02-13 MED ORDER — FLUTICASONE PROPIONATE 50 MCG/ACT NA SUSP: 2.0000 | Freq: Every day | NASAL | 6 refills | Status: DC

## 2019-02-13 NOTE — Progress Notes (Signed)
Virtual Visit via Video Note  I connected with Elizabeth Curtis on 02/13/19 at  2:30 PM EST by a video enabled telemedicine application and verified that I am speaking with the correct person using two identifiers.  Location: Patient: In her home Provider: [place] Persons participating in virtual visit: Patient and provider   I discussed the limitations of evaluation and management by telemedicine and the availability of in person appointments. The patient expressed understanding and agreed to proceed.  History of Present Illness: Chief Complaint  Patient presents with  . Head Congestion    Pt c/o ears feeling clogged/stopped up x 2-3 days. Head congestion x approx 2 weeks ago. Pt states that she can feel pressure build up in her head and her ears would unclog for a little bit then stop back up. Denies fever.  Started with a little sneezing.  Treating with heat, antihistamine and nasal spray   This is a 69 yo female who presents today for virtual visit with above cc.  Patient reports that several weeks ago she had some runny nose and sneezing.  Over the last several days has had sensation of pressure in her ears, sounds are muffled and she feels like she needs her ears to pop but they want.  She has not had any sharp pain or drainage from her ears.  She blows her nose a couple of times a day and has little postnasal drainage.  She denies headache, sore throat, recent cough, fever.  Yesterday she took 1 dose of fexofenadine and used an Afrin type nasal spray in 1 nostril.  Past Medical History:  Diagnosis Date  . Cataract 2020   right eye  . Chronic low back pain   . Diabetes mellitus   . Diffuse cystic mastopathy   . Disturbance of skin sensation   . Diverticulosis of colon (without mention of hemorrhage)   . Dysphagia, unspecified(787.20)   . Essential hypertension, benign 1995  . H/O cystitis 2011  . History of colonic polyps    Hyperplastic  . History of pelvic  ultrasound 03/05   Uterus 13 X 7 X 8cm, Fibroid 2.6cm  . HLD (hyperlipidemia)   . Lipoma   . Mammographic microcalcification 2013  . Melanoma ([place])    upper abdomin left SN removed under axilla  . Osteopenia 03/2015   DEXA hip -1.7, spine -1.7  . Plantar wart   . Schatzki's ring 07/23/08   EGD dilated O/W normal (Dr. Sharlett Iles)  . Shoulder pain, left   . 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 Vision abnormalities    Past Surgical History:  Procedure Laterality Date  . BREAST BIOPSY Right 07/2011   rec rpt 6 mo, neg  . [place]   [place] presentation  . COLONOSCOPY  2010   [place], rec rpt 10 yrs  . ESOPHAGOGASTRODUODENOSCOPY  2010   [place], dilated schatzki ring  . EYE SURGERY Left 2012   cataract  . INTRAOCULAR LENS EXCHANGE Left 05/2017   uveitis-glaucoma-hypheme syndrome ([place])  . KNEE ARTHROSCOPY W/ MENISCAL REPAIR Left 01/2014   [place]  . MELANOMA EXCISION  2012   upper abdomen   Family History  Problem Relation Age of Onset  . Diabetes Mother   . Hyperlipidemia Mother   . Hypertension Mother   . Mental illness Mother        Personality disorder  . Kidney disease Father        failure, (Diaylsis)  . Diabetes Father   .  Diabetes Brother   . Coronary artery disease Brother 68       CAD, hospitalized freq, PTCA  . Gout Brother   . Hypertension Brother   . Cancer Maternal Aunt 62       breast  . Breast cancer Maternal Aunt 70  . Cancer Paternal Aunt 31       breast  . Breast cancer Paternal Aunt 52  . ALS Paternal Aunt   . Stroke Paternal Grandfather   . Stroke Maternal Grandmother   . Breast cancer Maternal Aunt 70   Social History   Tobacco Use  . Smoking status: Never Smoker  . Smokeless tobacco: Never Used  Substance Use Topics  . Alcohol use: Yes    Comment: occassionally  . Drug use: No      Observations/Objective: Patient is alert and answers questions appropriately.  Visible skin is unremarkable.  She is  normally conversive without increased work of breathing, audible wheeze or witnessed cough.  Mood and affect are appropriate. BP 140/80 (BP Location: Left Arm, Patient Position: Sitting, Cuff Size: Normal)   Pulse 72   Temp 97.6 F (36.4 C) (Temporal)   Ht 5' 8.5" (1.74 m)   Wt 163 lb 8 oz (74.2 kg)   SpO2 97%   BMI 24.50 kg/m  Wt Readings from Last 3 Encounters:  02/13/19 163 lb 8 oz (74.2 kg)  11/01/18 163 lb 3 oz (74 kg)  07/26/18 168 lb 8 oz (76.4 kg)    Assessment and Plan: 1. Eustachian tube disorder, bilateral -Discussed diagnosis and treatment -We will have her continue Afrin nasal spray twice a day followed by fluticasone nasal spray for 3 days then fluticasone nightly. -Daily antihistamine, good fluid intake -Follow-up precautions reviewed, fever, productive cough, severe headache, purulent nasal drainage, or no improvement of symptoms in 5 to 7 days. - fluticasone (FLONASE) 50 MCG/ACT nasal spray; Place 2 sprays into both nostrils daily.  Dispense: 16 g; Refill: [place], FNP-BC  [facility] [place] [facility]  02/13/2019 2:53 PM   Follow Up Instructions:    I discussed the assessment and treatment plan with the patient. The patient was provided an opportunity to ask questions and all were answered. The patient agreed with the plan and demonstrated an understanding of the instructions.   The patient was advised to call back or seek an in-person evaluation if the symptoms worsen or if the condition fails to improve as anticipated.   Elby Beck, FNP

## 2019-03-07 ENCOUNTER — Ambulatory Visit: Payer: PPO

## 2019-03-18 ENCOUNTER — Ambulatory Visit: Payer: PPO

## 2019-03-20 ENCOUNTER — Encounter: Payer: Self-pay | Admitting: Family Medicine

## 2019-03-23 ENCOUNTER — Telehealth: Payer: Self-pay | Admitting: Family Medicine

## 2019-03-23 NOTE — Progress Notes (Signed)
°  Chronic Care Management   Outreach Note  03/23/2019 Name: Elizabeth Curtis MRN: [id number] DOB: Aug 02, 1950  Referred by: Ria Bush, MD Reason for referral : No chief complaint on file.   An unsuccessful telephone outreach was attempted today. The patient was referred to the pharmacist for assistance with care management and care coordination.   Follow Up Plan:   Raynicia Dukes [employer] Scheduler

## 2019-03-23 NOTE — Chronic Care Management (AMB) (Signed)
  Chronic Care Management   Note  03/23/2019 Name: SAYDI KOBEL MRN: [id number] DOB: May 18, 1950  [name] is a 69 y.o. year old female who is a primary care patient of Ria Bush, MD. I reached out to [name] by phone today in response to a referral sent by Ms. Raul Del Kobayashi's PCP, Ria Bush, MD.   Ms. Gitto was given information about Chronic Care Management services today including:  1. CCM service includes personalized support from designated clinical staff supervised by her physician, including individualized plan of care and coordination with other care providers 2. 24/7 contact phone numbers for assistance for urgent and routine care needs. 3. Service will only be billed when office clinical staff spend 20 minutes or more in a month to coordinate care. 4. Only one practitioner may furnish and bill the service in a calendar month. 5. The patient may stop CCM services at any time (effective at the end of the month) by phone call to the office staff. 6. The patient will be responsible for cost sharing (co-pay) of up to 20% of the service fee (after annual deductible is met).  Patient agreed to services and verbal consent obtained.   Follow up plan:   Raynicia Dukes [employer] Scheduler

## 2019-03-27 ENCOUNTER — Ambulatory Visit: Payer: PPO

## 2019-03-27 ENCOUNTER — Telehealth: Payer: Self-pay

## 2019-03-27 ENCOUNTER — Other Ambulatory Visit: Payer: Self-pay

## 2019-03-27 DIAGNOSIS — I10 Essential (primary) hypertension: Secondary | ICD-10-CM

## 2019-03-27 DIAGNOSIS — E1165 Type 2 diabetes mellitus with hyperglycemia: Secondary | ICD-10-CM

## 2019-03-27 DIAGNOSIS — M8580 Other specified disorders of bone density and structure, unspecified site: Secondary | ICD-10-CM

## 2019-03-27 DIAGNOSIS — E782 Mixed hyperlipidemia: Secondary | ICD-10-CM

## 2019-03-27 NOTE — Telephone Encounter (Signed)
I would like to request a referral for Elizabeth Curtis to chronic care management pharmacy services focusing on the following conditions:   Essential hypertension, benign  [I10]  Diabetes mellitus type 2, uncontrolled ([place]) [E11.65]   Debbora Dus, PharmD Clinical Pharmacist [facility] [phone]

## 2019-03-27 NOTE — Patient Instructions (Signed)
March 27, 2019  Dear Elizabeth Curtis,  It was a pleasure meeting you during our initial appointment on March 27, 2019. Below is a summary of the goals we discussed and components of chronic care management. Please contact me anytime with questions or concerns. I've included a handout on carbohydrate counting based on our discussion today.  Visit Information  Goals Addressed            This Visit's Progress   . Pharmacy Care Plan       Current Barriers:  . Chronic Disease Management support, education, and care coordination needs related to hypertension, diabetes, hyperlipidemia, and osteopenia  Pharmacist Clinical Goal(s):  Marland Kitchen Assist with patient goal of reducing diabetes medications. Encouraged patient to check blood glucose at home at least once daily before breakfast until next follow up with Dr. Darnell Level. Keep log of all readings. . Maintain blood pressure within goal of < 140/90 mmHg. Encouraged checking blood pressure 2-3 days a week and bring to next appointment. Marland Kitchen Resolve allergy symptoms, including nasal congestion. Recommend fluticasone use - 2 sprays in each nostril daily until symptoms improve, then 1 spray in each nostril daily throughout allergy season. . Limit unnecessary over the counter supplements. Recommend discontinuing vitamin K supplement and additional vitamin D 5000 IU daily. Vitamin D 1000 IU daily meets daily dietary recommendations for most patients. . Achieve cholesterol goals of LDL < 100 mg/dL, HDL > 50 mg/dL, TG < 150 mg/dL. Increase healthy fats such as olive oil, nuts, seeds, avocado and fish.  Continue daily walking and slowly incorporate weight bearing exercise. Resume Welchol 625 mg twice weekly. . Remain up to date on recommended vaccinations. Recommend shingles vaccine from local pharmacy.  Interventions: . Comprehensive medication review performed. . Provided medication counseling.  Patient Self Care Activities:  . Exercises daily . Uses pillbox,  takes medications as prescribed  Initial goal documentation        Elizabeth Curtis was given information about Chronic Care Management services today including:  1. CCM service includes personalized support from designated clinical staff supervised by her physician, including individualized plan of care and coordination with other care providers 2. 24/7 contact phone numbers for assistance for urgent and routine care needs. 3. Service will only be billed when office clinical staff spend 20 minutes or more in a month to coordinate care. 4. Only one practitioner may furnish and bill the service in a calendar month. 5. The patient may stop CCM services at any time (effective at the end of the month) by phone call to the office staff. 6. The patient will be responsible for cost sharing (co-pay) of up to 20% of the service fee (after annual deductible is met).  Patient agreed to services and verbal consent obtained.   Debbora Dus, PharmD Clinical Pharmacist [facility] [phone] Carbohydrate Counting for Diabetes Mellitus, Adult  Carbohydrate counting is a method of keeping track of how many carbohydrates you eat. Eating carbohydrates naturally increases the amount of sugar (glucose) in the blood. Counting how many carbohydrates you eat helps keep your blood glucose within normal limits, which helps you manage your diabetes (diabetes mellitus). It is important to know how many carbohydrates you can safely have in each meal. This is different for every person. A diet and nutrition specialist (registered dietitian) can help you make a meal plan and calculate how many carbohydrates you should have at each meal and snack. Carbohydrates are found in the following foods:  Grains,  such as breads and cereals.  Dried beans and soy products.  Starchy vegetables, such as potatoes, peas, and corn.  Fruit and fruit juices.  Milk and yogurt.  Sweets and snack foods,  such as cake, cookies, candy, chips, and soft drinks. How do I count carbohydrates? There are two ways to count carbohydrates in food. You can use either of the methods or a combination of both. Reading "Nutrition Facts" on packaged food The "Nutrition Facts" list is included on the labels of almost all packaged foods and beverages in the [place] It includes:  The serving size.  Information about nutrients in each serving, including the grams (g) of carbohydrate per serving. To use the "Nutrition Facts":  Decide how many servings you will have.  Multiply the number of servings by the number of carbohydrates per serving.  The resulting number is the total amount of carbohydrates that you will be having. Learning standard serving sizes of other foods When you eat carbohydrate foods that are not packaged or do not include "Nutrition Facts" on the label, you need to measure the servings in order to count the amount of carbohydrates:  Measure the foods that you will eat with a food scale or measuring cup, if needed.  Decide how many standard-size servings you will eat.  Multiply the number of servings by 15. Most carbohydrate-rich foods have about 15 g of carbohydrates per serving. ? For example, if you eat 8 oz (170 g) of strawberries, you will have eaten 2 servings and 30 g of carbohydrates (2 servings x 15 g = 30 g).  For foods that have more than one food mixed, such as soups and casseroles, you must count the carbohydrates in each food that is included. The following list contains standard serving sizes of common carbohydrate-rich foods. Each of these servings has about 15 g of carbohydrates:   hamburger bun or  English muffin.   oz (15 mL) syrup.   oz (14 g) jelly.  1 slice of bread.  1 six-inch tortilla.  3 oz (85 g) cooked rice or pasta.  4 oz (113 g) cooked dried beans.  4 oz (113 g) starchy vegetable, such as peas, corn, or potatoes.  4 oz (113 g) hot cereal.  4  oz (113 g) mashed potatoes or  of a large baked potato.  4 oz (113 g) canned or frozen fruit.  4 oz (120 mL) fruit juice.  4-6 crackers.  6 chicken nuggets.  6 oz (170 g) unsweetened dry cereal.  6 oz (170 g) plain fat-free yogurt or yogurt sweetened with artificial sweeteners.  8 oz (240 mL) milk.  8 oz (170 g) fresh fruit or one small piece of fruit.  24 oz (680 g) popped popcorn. Example of carbohydrate counting Sample meal  3 oz (85 g) chicken breast.  6 oz (170 g) brown rice.  4 oz (113 g) corn.  8 oz (240 mL) milk.  8 oz (170 g) strawberries with sugar-free whipped topping. Carbohydrate calculation 1. Identify the foods that contain carbohydrates: ? Rice. ? Corn. ? Milk. ? Strawberries. 2. Calculate how many servings you have of each food: ? 2 servings rice. ? 1 serving corn. ? 1 serving milk. ? 1 serving strawberries. 3. Multiply each number of servings by 15 g: ? 2 servings rice x 15 g = 30 g. ? 1 serving corn x 15 g = 15 g. ? 1 serving milk x 15 g = 15 g. ? 1 serving strawberries x  15 g = 15 g. 4. Add together all of the amounts to find the total grams of carbohydrates eaten: ? 30 g + 15 g + 15 g + 15 g = 75 g of carbohydrates total. Summary  Carbohydrate counting is a method of keeping track of how many carbohydrates you eat.  Eating carbohydrates naturally increases the amount of sugar (glucose) in the blood.  Counting how many carbohydrates you eat helps keep your blood glucose within normal limits, which helps you manage your diabetes.  A diet and nutrition specialist (registered dietitian) can help you make a meal plan and calculate how many carbohydrates you should have at each meal and snack. This information is not intended to replace advice given to you by your health care provider. Make sure you discuss any questions you have with your health care provider. Document Revised: 08/20/2016 Document Reviewed: 07/10/2015 Elsevier Patient  Education  [place].

## 2019-03-27 NOTE — Chronic Care Management (AMB) (Signed)
Chronic Care Management Pharmacy  Name: GRACELEE ROMBACH  MRN: [id number] DOB: April 02, 1950  Chief Complaint/ HPI  [name],  69 y.o. , female presents for their Initial CCM visit with the clinical pharmacist via telephone.  PCP : Ria Bush, MD  Their chronic conditions include: HTN, HLD, DM, osteopenia   Patient concerns: no concerns, hoping to reduce some of her DM medications  Office Visits:   02/13/19 Carlean Purl - head congestion, Afrin x 3 days with fluticasone nightly, daily antihistamine  11/01/18 Danise Mina - DM much improved, may reduce glipizide to once daily if hypoglycemia   Consult Visit: none in last 6 months   Allergies  Allergen Reactions  . Avandia [Rosiglitazone] Other (See Comments)    arthralgias  . Other Other (See Comments)    Steri Strips - blood blisters and skin irritation.  Mastasol glue - blood blisters and skin irritation.   . Statins Other (See Comments)    crestor - arthralgias Pravastatin - depression RYR - arthralgias  . Trulicity [Dulaglutide] Other (See Comments)    Stomach pain, bowel changes, malaise   Medications: Outpatient Encounter Medications as of 03/27/2019  Medication Sig  . amLODipine (NORVASC) 5 MG tablet Take 1 tablet (5 mg total) by mouth at bedtime.  . Calcium Carbonate-Vitamin D (CALCIUM-VITAMIN D3 PO) Take by mouth. 1200 mg of calcium; 1000 units of Vit D3  . Cholecalciferol (VITAMIN D3 PO) Take 5,000 Units by mouth daily.  . colesevelam (WELCHOL) 625 MG tablet Take 1 tablet (625 mg total) by mouth every Monday, Wednesday, and Friday.  . enalapril (VASOTEC) 20 MG tablet Take 1 tablet (20 mg total) by mouth 2 (two) times daily.  . fluticasone (FLONASE) 50 MCG/ACT nasal spray Place 2 sprays into both nostrils daily.  Marland Kitchen glipiZIDE (GLUCOTROL) 5 MG tablet Take 1 tablet (5 mg total) by mouth 2 (two) times daily before a meal.  . glucose blood (FREESTYLE LITE) test strip USE TO CHECK SUGAR ONCE DAILY AND AS NEEDED.  DX:E11.65  . Insulin Glargine (LANTUS SOLOSTAR) 100 UNIT/ML Solostar Pen Inject 20 Units into the skin daily.  . Insulin Pen Needle (BD PEN NEEDLE MICRO U/F) 32G X 6 MM MISC Use as directed once daliy  . metFORMIN (GLUCOPHAGE-XR) 500 MG 24 hr tablet TAKE 1 (ONE) TABLET IN AM AND 2 (TWO) TABLETS IN PM WITH MEALS  . MISC NATURAL PRODUCTS PO Take by mouth. CBD supplement 0.5 mg by mouth daily  . Multiple Vitamins-Minerals (CENTRUM SILVER PO) Take 1 tablet by mouth daily.  . Turmeric ([facility] TURMERIC CURCUMIN) 500 MG CAPS Take 1 capsule by mouth daily.  Marland Kitchen VITAMIN K PO Take 100 mcg by mouth daily.   No facility-administered encounter medications on file as of 03/27/2019.    Current Diagnosis/Assessment: Goals    . Increase physical activity     When schedule permits, I will walk 1.5 miles daily.     Marland Kitchen Pharmacy Care Plan     Current Barriers:  . Chronic Disease Management support, education, and care coordination needs related to hypertension, diabetes, hyperlipidemia, and osteopenia  Pharmacist Clinical Goal(s):  Marland Kitchen Assist with patient goal of reducing diabetes medications. Encouraged patient to check blood glucose at home at least once daily before breakfast until next follow up with Dr. Darnell Level. Keep log of all readings. . Maintain blood pressure within goal of < 140/90 mmHg. Encouraged checking blood pressure 2-3 days a week and bring to next appointment. Marland Kitchen Resolve allergy symptoms, including nasal congestion. Recommend  fluticasone use - 2 sprays in each nostril daily until symptoms improve, then 1 spray in each nostril daily throughout allergy season. . Limit unnecessary over the counter supplements. Recommend discontinuing vitamin K supplement and additional vitamin D 5000 IU daily. Vitamin D 1000 IU daily meets daily dietary recommendations for most patients. . Achieve cholesterol goals of LDL < 100 mg/dL, HDL > 50 mg/dL, TG < 150 mg/dL. Increase healthy fats such as olive oil, nuts, seeds, avocado  and fish.  Continue daily walking and slowly incorporate weight bearing exercise. Resume Welchol 625 mg twice weekly. . Remain up to date on recommended vaccinations. Recommend shingles vaccine from local pharmacy.  Interventions: . Comprehensive medication review performed. . Provided medication counseling.  Patient Self Care Activities:  . Exercises daily . Uses pillbox, takes medications as prescribed  Initial goal documentation        Diabetes   Recent Relevant Labs: Lab Results  Component Value Date/Time   HGBA1C 6.5 (A) 11/01/2018 08:23 AM   HGBA1C 8.1 (H) 07/15/2018 09:43 AM   HGBA1C 7.5 (H) 12/15/2017 08:08 AM   MICROALBUR <0.7 03/01/2015 08:21 AM   MICROALBUR 1.0 12/11/2013 08:22 AM    Checking BG: none Denies symptoms of hypoglycemia  Patient has failed these meds in past: Trulicity (stomach pain) rosiglitazone (musclpe pain) Exercise: walks around neighborhood 1.1 miles a day  Diet: carbohydrate counts - 50 carbs per meal, 15 carbs snacks; working on reducing portion sizes since the holidays   Patient is currently controlled on the following medications:   Metformin 500 mg ER - 1 qAM, 2 qPM  Glipizide 5 mg - 1 tablet with evening meal  Lantus - 20 units daily  Last diabetic eye exam:  Lab Results  Component Value Date/Time   HMDIABEYEEXA No Retinopathy 07/14/2018 12:00 AM    Last diabetic foot exam: unknown   We discussed: Interested in coming off of as much medication as possible. Difficult to assess BG control without any home readings; encouraged checking at least once daily prior to next visit with Dr. Darnell Level.  Plan: Continue current medications. Check BG at least once daily prior to next office visit to assess whether medications can be further reduced.  Hypertension   Office blood pressures are:  BP Readings from Last 3 Encounters:  02/13/19 140/80  11/01/18 122/84  07/26/18 128/84   CMP Latest Ref Rng & Units 07/15/2018 12/15/2017 10/26/2017    Glucose 70 - 99 mg/dL 168(H) 179(H) -  BUN 6 - 23 mg/dL 14 15 -  Creatinine 0.40 - 1.20 mg/dL 0.68 0.67 0.7  Sodium 135 - 145 mEq/L 141 140 142  Potassium 3.5 - 5.1 mEq/L 4.1 3.6 4.5  Chloride 96 - 112 mEq/L 102 102 -  CO2 19 - 32 mEq/L 30 29 -  Calcium 8.4 - 10.5 mg/dL 9.3 9.2 -  Total Protein 6.0 - 8.3 g/dL 6.4 - -  Total Bilirubin 0.2 - 1.2 mg/dL 0.6 - -  Alkaline Phos 39 - 117 U/L 48 - 58  AST 0 - 37 U/L 12 - 9(A)  ALT 0 - 35 U/L 15 - 12   Patient has failed these meds in the past: none reported Patient checks BP at home none   Patient is currently Controlled on the following medications:   Amlodipine 5 mg - 1 tablet daily at bedtime  Enalapril 20 mg - 1 tablet twice daily  We discussed: check BP at home 2-3 times a week to assess for reducing medications  Plan: Continue current medications. Check BP at home 2-3 times a week to assess control and bring log to next office visit.     Hyperlipidemia   Lipid Panel     Component Value Date/Time   CHOL 171 07/15/2018 0943   TRIG 147.0 07/15/2018 0943   HDL 40.10 07/15/2018 0943   CHOLHDL 4 07/15/2018 0943   VLDL 29.4 07/15/2018 0943   LDLCALC 102 (H) 07/15/2018 0943   LDLDIRECT 122.0 12/23/2015 0906   LDL goal < 100 Patient has failed these meds in past: crestor, pravastatin (myaglia)  Patient is currently controlled on the following medications:   Welchol 625 mg - 1 tablet M, W, F (NOT taking)  We discussed: patient has not been taking Welchol since first of the year, forgets to take it as she doesn't include it in her pillbox. Also reports some constipation. Discussed benefits of ezetimibe, but patient would rather restart Welchol at this time. Patient would like to start with twice weekly dosing.  Plan: Restart Welchol, 2 days a week.  Osteopenia  Fractures: none Falls: none Patient has failed these meds in past: none  Patient is currently controlled on the following medications:   Vitamin D3 5000 IU daily  (vitamin D WNL 2015)  Calcium/vitamin D3 1200 mg/1000IU daily - takes when she can remember  We discussed: Vitamin D 1000 IU daily meets daily recommended intake. Patient may discontinue 5000 IU to reduce pill burden; Patient would like to finish her current supply first. Patient also reports taking vitamin K for osteopenia. Recommended discontinuing as vitamin K is usually in multivitamin and no indication.   Plan: Continue calcium/vitamin D3 daily. May discontinue additional vitamin D 5000 IU daily.   No dx: Allergy symptoms  Symptoms: nasal congestion for the last 5- 6 weeks  Patient has failed these meds in past: 02/13/19 - saw Tor Netters and tried Afrin x 3 days with a few weeks of Flonase and antihistamine, symptoms improved but have returned since patient stopped medications  Patient is currently uncontrolled on the following medications:   No pharmacotherapy We discussed: patient reports allergies worsen seasonally the past few years  Plan: If symptoms intolerable, recommend resuming Flonase 50 mcg 2 sprays in each nostril daily until symptoms improve, then 1 spray in each nostril daily throughout allergy season.   Medication Management  OTCs: tumeric 500 mg - OA, vitamin K 100 mcg, Centrum Silver mutlivitamin, CBD supplement 0.5 mg - PRN if muscle pain --> recommended discontinuing vitamin K supplement   [facility]  Adherence: pill box (hard time remembering supplements - takes them at lunch); recommend putting all medications in pillbox   Affordability: no concerns, Lantus is most expensive  Vaccines: COVID-19 x 1, Influenza, PCV13, PPSV23, Td - up to date; recommend Shingrix, 2nd dose of COVID vaccine next week  CCM Follow Up: no follow up desired, patient will call if she needs anything  Debbora Dus, PharmD Clinical Pharmacist [facility] [phone]

## 2019-04-07 ENCOUNTER — Encounter: Payer: Self-pay | Admitting: Family Medicine

## 2019-04-07 NOTE — Telephone Encounter (Signed)
Updated pt's chart.  Notified pt via [place].

## 2019-04-15 ENCOUNTER — Encounter: Payer: Self-pay | Admitting: Family Medicine

## 2019-04-18 ENCOUNTER — Encounter: Payer: Self-pay | Admitting: Family Medicine

## 2019-04-18 ENCOUNTER — Other Ambulatory Visit: Payer: Self-pay

## 2019-04-18 ENCOUNTER — Other Ambulatory Visit: Payer: Self-pay | Admitting: Family Medicine

## 2019-04-18 ENCOUNTER — Ambulatory Visit (INDEPENDENT_AMBULATORY_CARE_PROVIDER_SITE_OTHER): Payer: PPO | Admitting: Family Medicine

## 2019-04-18 VITALS — BP 126/84 | HR 77 | Temp 97.8°F | Ht 68.5 in | Wt 164.4 lb

## 2019-04-18 DIAGNOSIS — E1165 Type 2 diabetes mellitus with hyperglycemia: Secondary | ICD-10-CM

## 2019-04-18 DIAGNOSIS — E1169 Type 2 diabetes mellitus with other specified complication: Secondary | ICD-10-CM | POA: Diagnosis not present

## 2019-04-18 DIAGNOSIS — E785 Hyperlipidemia, unspecified: Secondary | ICD-10-CM

## 2019-04-18 DIAGNOSIS — Z1231 Encounter for screening mammogram for malignant neoplasm of breast: Secondary | ICD-10-CM

## 2019-04-18 LAB — POCT GLYCOSYLATED HEMOGLOBIN (HGB A1C): Hemoglobin A1C: 7.2 % — AB (ref 4.0–5.6)

## 2019-04-18 MED ORDER — METFORMIN HCL ER 500 MG PO TB24: ORAL_TABLET | ORAL | 1 refills | Status: DC

## 2019-04-18 MED ORDER — BD PEN NEEDLE MICRO U/F 32G X 6 MM MISC: 3 refills | Status: DC

## 2019-04-18 MED ORDER — AMLODIPINE BESYLATE 5 MG PO TABS: 5.0000 mg | ORAL_TABLET | Freq: Every day | ORAL | 3 refills | Status: DC

## 2019-04-18 MED ORDER — FREESTYLE LITE TEST VI STRP: ORAL_STRIP | 3 refills | Status: AC

## 2019-04-18 MED ORDER — ENALAPRIL MALEATE 20 MG PO TABS: 20.0000 mg | ORAL_TABLET | Freq: Two times a day (BID) | ORAL | 3 refills | Status: DC

## 2019-04-18 MED ORDER — LANTUS SOLOSTAR 100 UNIT/ML ~~LOC~~ SOPN: 20.0000 [IU] | PEN_INJECTOR | Freq: Every day | SUBCUTANEOUS | 4 refills | Status: DC

## 2019-04-18 MED ORDER — GLIPIZIDE 5 MG PO TABS: 5.0000 mg | ORAL_TABLET | Freq: Two times a day (BID) | ORAL | 3 refills | Status: DC

## 2019-04-18 NOTE — Patient Instructions (Addendum)
Look into [place] (or onglyza or tradjenta).  You are doing well today. Work on low sugar low carb diet, regular exercise.  Return for physical in 3-4 months.   Sitagliptin oral tablet (Januvia) What is this medicine? SITAGLIPTIN (sit a GLIP tin) helps to treat type 2 diabetes. It helps to control blood sugar. Treatment is combined with diet and exercise. This medicine may be used for other purposes; ask your health care provider or pharmacist if you have questions. COMMON BRAND NAME(S): Januvia What should I tell my health care provider before I take this medicine? They need to know if you have any of these conditions:  diabetic ketoacidosis  kidney disease  pancreatitis  previous swelling of the tongue, face, or lips with difficulty breathing, difficulty swallowing, hoarseness, or tightening of the throat  type 1 diabetes  an unusual or allergic reaction to sitagliptin, other medicines, foods, dyes, or preservatives  pregnant or trying to get pregnant  breast-feeding How should I use this medicine? Take this medicine by mouth with a glass of water. Follow the directions on the prescription label. You can take it with or without food. Do not cut, crush or chew this medicine. Take your dose at the same time each day. Do not take more often than directed. Do not stop taking except on your doctor's advice. A special MedGuide will be given to you by the pharmacist with each prescription and refill. Be sure to read this information carefully each time. Talk to your pediatrician regarding the use of this medicine in children. Special care may be needed. Overdosage: If you think you have taken too much of this medicine contact a poison control center or emergency room at once. NOTE: This medicine is only for you. Do not share this medicine with others. What if I miss a dose? If you miss a dose, take it as soon as you can. If it is almost time for your next dose, take only that dose. Do  not take double or extra doses. What may interact with this medicine? Do not take this medicine with any of the following medications:  gatifloxacin This medicine may also interact with the following medications:  alcohol  digoxin  insulin  sulfonylureas like glimepiride, glipizide, glyburide This list may not describe all possible interactions. Give your health care provider a list of all the medicines, herbs, non-prescription drugs, or dietary supplements you use. Also tell them if you smoke, drink alcohol, or use illegal drugs. Some items may interact with your medicine. What should I watch for while using this medicine? Visit your doctor or health care professional for regular checks on your progress. A test called the HbA1C (A1C) will be monitored. This is a simple blood test. It measures your blood sugar control over the last 2 to 3 months. You will receive this test every 3 to 6 months. Learn how to check your blood sugar. Learn the symptoms of low and high blood sugar and how to manage them. Always carry a quick-source of sugar with you in case you have symptoms of low blood sugar. Examples include hard sugar candy or glucose tablets. Make sure others know that you can choke if you eat or drink when you develop serious symptoms of low blood sugar, such as seizures or unconsciousness. They must get medical help at once. Tell your doctor or health care professional if you have high blood sugar. You might need to change the dose of your medicine. If you are sick or  exercising more than usual, you might need to change the dose of your medicine. Do not skip meals. Ask your doctor or health care professional if you should avoid alcohol. Many nonprescription cough and cold products contain sugar or alcohol. These can affect blood sugar. Wear a medical ID bracelet or chain, and carry a card that describes your disease and details of your medicine and dosage times. What side effects may I  notice from receiving this medicine? Side effects that you should report to your doctor or health care professional as soon as possible:  allergic reactions like skin rash, itching or hives, swelling of the face, lips, or tongue  breathing problems  general ill feeling or flu-like symptoms  joint pain  loss of appetite  redness, blistering, peeling or loosening of the skin, including inside the mouth  signs and symptoms of heart failure like breathing problems, fast, irregular heartbeat, sudden weight gain; swelling of the ankles, feet, hands; unusually weak or tired  signs and symptoms of low blood sugar such as feeling anxious, confusion, dizziness, increased hunger, unusually weak or tired, sweating, shakiness, cold, irritable, headache, blurred vision, fast heartbeat, loss of consciousness  signs and symptoms of muscle injury like dark urine; trouble passing urine or change in the amount of urine; unusually weak or tired; muscle pain; back pain  unusual stomach upset or pain  vomiting Side effects that usually do not require medical attention (report to your doctor or health care professional if they continue or are bothersome):  diarrhea  headache  sore throat  stomach upset  stuffy or runny nose This list may not describe all possible side effects. Call your doctor for medical advice about side effects. You may report side effects to FDA at 1-800-FDA-1088. Where should I keep my medicine? Keep out of the reach of children. Store at room temperature between 15 and 30 degrees C (59 and 86 degrees F). Throw away any unused medicine after the expiration date. NOTE: This sheet is a summary. It may not cover all possible information. If you have questions about this medicine, talk to your doctor, pharmacist, or health care provider.  2020 Elsevier/Gold Standard (2017-08-31 16:38:43)

## 2019-04-18 NOTE — Progress Notes (Signed)
This visit was conducted in person.  BP 126/84 (BP Location: Left Arm, Patient Position: Sitting, Cuff Size: Normal)   Pulse 77   Temp 97.8 F (36.6 C) (Temporal)   Ht 5' 8.5" (1.74 m)   Wt 164 lb 6 oz (74.6 kg)   SpO2 98%   BMI 24.63 kg/m    CC: DM f/u visit Subjective:    Patient ID: Elizabeth Curtis, female    DOB: 1950-05-18, 69 y.o.   MRN: [id number]  HPI: Elizabeth Curtis is a 69 y.o. female presenting on 04/18/2019 for Diabetes (Here for f/u.)   DM - does regularly check sugars 140 in am, 136 before dinner. Compliant with antihyperglycemic regimen which includes: glipizide 5mg  bid, lantus 20u QHS, metformin XR 500/1000mg  daily. H/o recurrent UTIs. Did not tolerate trulicity. Denies low sugars or hypoglycemic symptoms. Denies paresthesias. Last diabetic eye exam 07/2018. Pneumovax: 2013. Prevnar: 2017. Glucometer brand: freestyle lite. DSME: completed remotely. Lab Results  Component Value Date   HGBA1C 7.2 (A) 04/18/2019   Diabetic Foot Exam - Simple   Simple Foot Form Diabetic Foot exam was performed with the following findings: Yes 04/18/2019  8:25 AM  Visual Inspection No deformities, no ulcerations, no other skin breakdown bilaterally: Yes Sensation Testing Intact to touch and monofilament testing bilaterally: Yes Pulse Check Posterior Tibialis and Dorsalis pulse intact bilaterally: Yes Comments    Lab Results  Component Value Date   MICROALBUR <0.7 03/01/2015         Relevant past medical, surgical, family and social history reviewed and updated as indicated. Interim medical history since our last visit reviewed. Allergies and medications reviewed and updated. Outpatient Medications Prior to Visit  Medication Sig Dispense Refill  . Calcium Carbonate-Vitamin D (CALCIUM-VITAMIN D3 PO) Take by mouth. 1200 mg of calcium; 1000 units of Vit D3    . Cholecalciferol (VITAMIN D3 PO) Take 5,000 Units by mouth daily.    . colesevelam (WELCHOL) 625 MG tablet Take 1  tablet (625 mg total) by mouth every Monday, Wednesday, and Friday. 30 tablet 3  . MISC NATURAL PRODUCTS PO Take by mouth. CBD supplement 0.5 mg by mouth daily    . Multiple Vitamins-Minerals (CENTRUM SILVER PO) Take 1 tablet by mouth daily.    . Turmeric ([facility] TURMERIC CURCUMIN) 500 MG CAPS Take 1 capsule by mouth daily.    Marland Kitchen VITAMIN K PO Take 100 mcg by mouth daily.    Marland Kitchen amLODipine (NORVASC) 5 MG tablet Take 1 tablet (5 mg total) by mouth at bedtime. 90 tablet 3  . enalapril (VASOTEC) 20 MG tablet Take 1 tablet (20 mg total) by mouth 2 (two) times daily. 180 tablet 3  . glipiZIDE (GLUCOTROL) 5 MG tablet Take 1 tablet (5 mg total) by mouth 2 (two) times daily before a meal. 180 tablet 3  . glucose blood (FREESTYLE LITE) test strip USE TO CHECK SUGAR ONCE DAILY AND AS NEEDED. DX:E11.65 100 each 3  . Insulin Glargine (LANTUS SOLOSTAR) 100 UNIT/ML Solostar Pen Inject 20 Units into the skin daily.    . Insulin Pen Needle (BD PEN NEEDLE MICRO U/F) 32G X 6 MM MISC Use as directed once daliy 100 each 3  . metFORMIN (GLUCOPHAGE-XR) 500 MG 24 hr tablet TAKE 1 (ONE) TABLET IN AM AND 2 (TWO) TABLETS IN PM WITH MEALS 270 tablet 1  . fluticasone (FLONASE) 50 MCG/ACT nasal spray Place 2 sprays into both nostrils daily. (Patient not taking: Reported on 03/27/2019) 16 g 6  No facility-administered medications prior to visit.     Per HPI unless specifically indicated in ROS section below Review of Systems Objective:    BP 126/84 (BP Location: Left Arm, Patient Position: Sitting, Cuff Size: Normal)   Pulse 77   Temp 97.8 F (36.6 C) (Temporal)   Ht 5' 8.5" (1.74 m)   Wt 164 lb 6 oz (74.6 kg)   SpO2 98%   BMI 24.63 kg/m   Wt Readings from Last 3 Encounters:  04/18/19 164 lb 6 oz (74.6 kg)  02/13/19 163 lb 8 oz (74.2 kg)  11/01/18 163 lb 3 oz (74 kg)    Physical Exam Vitals and nursing note reviewed.  Constitutional:      General: She is not in acute distress.    Appearance: Normal appearance. She  is well-developed. She is not ill-appearing.  HENT:     Head: Normocephalic and atraumatic.     Left Ear: Tympanic membrane, ear canal and external ear normal. There is no impacted cerumen.  Eyes:     General: No scleral icterus.    Extraocular Movements: Extraocular movements intact.     Conjunctiva/sclera: Conjunctivae normal.     Pupils: Pupils are equal, round, and reactive to light.  Cardiovascular:     Rate and Rhythm: Normal rate and regular rhythm.     Pulses: Normal pulses.     Heart sounds: Normal heart sounds. No murmur.  Pulmonary:     Effort: Pulmonary effort is normal. No respiratory distress.     Breath sounds: Normal breath sounds. No wheezing, rhonchi or rales.  Musculoskeletal:     Cervical back: Normal range of motion and neck supple.     Right lower leg: No edema.     Left lower leg: No edema.     Comments: See HPI for foot exam if done  Lymphadenopathy:     Cervical: No cervical adenopathy.  Skin:    General: Skin is warm and dry.     Findings: No rash.  Neurological:     Mental Status: She is alert.  Psychiatric:        Mood and Affect: Mood normal.        Behavior: Behavior normal.       Results for orders placed or performed in visit on 04/18/19  POCT glycosylated hemoglobin (Hb A1C)  Result Value Ref Range   Hemoglobin A1C 7.2 (A) 4.0 - 5.6 %   HbA1c POC (<> result, manual entry)     HbA1c, POC (prediabetic range)     HbA1c, POC (controlled diabetic range)     Assessment & Plan:  This visit occurred during the SARS-CoV-2 public health emergency.  Safety protocols were in place, including screening questions prior to the visit, additional usage of staff PPE, and extensive cleaning of exam room while observing appropriate contact time as indicated for disinfecting solutions.   Problem List Items Addressed This Visit    Hyperlipidemia associated with type 2 diabetes mellitus ([place])    Statin intolerance. On welchol.  Lab Results  Component Value  Date   LDLCALC 102 (H) 07/15/2018        Relevant Medications   enalapril (VASOTEC) 20 MG tablet   glipiZIDE (GLUCOTROL) 5 MG tablet   metFORMIN (GLUCOPHAGE-XR) 500 MG 24 hr tablet   insulin glargine (LANTUS SOLOSTAR) 100 UNIT/ML Solostar Pen   Diabetes mellitus type 2, uncontrolled (HCC) - Primary    Chronic, slight deterioration attributed to holiday season. Encouraged continued efforts at low  sugar low carb diet.       Relevant Medications   enalapril (VASOTEC) 20 MG tablet   glipiZIDE (GLUCOTROL) 5 MG tablet   metFORMIN (GLUCOPHAGE-XR) 500 MG 24 hr tablet   insulin glargine (LANTUS SOLOSTAR) 100 UNIT/ML Solostar Pen   Other Relevant Orders   POCT glycosylated hemoglobin (Hb A1C) (Completed)       Meds ordered this encounter  Medications  . enalapril (VASOTEC) 20 MG tablet    Sig: Take 1 tablet (20 mg total) by mouth 2 (two) times daily.    Dispense:  180 tablet    Refill:  3  . glucose blood (FREESTYLE LITE) test strip    Sig: USE TO CHECK SUGAR ONCE DAILY AND AS NEEDED. DX:E11.65    Dispense:  100 each    Refill:  3  . glipiZIDE (GLUCOTROL) 5 MG tablet    Sig: Take 1 tablet (5 mg total) by mouth 2 (two) times daily before a meal.    Dispense:  180 tablet    Refill:  3  . metFORMIN (GLUCOPHAGE-XR) 500 MG 24 hr tablet    Sig: TAKE 1 (ONE) TABLET IN AM AND 2 (TWO) TABLETS IN PM WITH MEALS    Dispense:  270 tablet    Refill:  1  . amLODipine (NORVASC) 5 MG tablet    Sig: Take 1 tablet (5 mg total) by mouth at bedtime.    Dispense:  90 tablet    Refill:  3  . Insulin Pen Needle (BD PEN NEEDLE MICRO U/F) 32G X 6 MM MISC    Sig: Use as directed once daliy    Dispense:  100 each    Refill:  3  . insulin glargine (LANTUS SOLOSTAR) 100 UNIT/ML Solostar Pen    Sig: Inject 20 Units into the skin daily.    Dispense:  15 mL    Refill:  4   Orders Placed This Encounter  Procedures  . POCT glycosylated hemoglobin (Hb A1C)    Patient instructions: Look into [place]  (or onglyza or tradjenta).  You are doing well today. Work on low sugar low carb diet, regular exercise.  Return for physical in 3-4 months.   Follow up plan: Return in about 4 months (around 08/18/2019) for annual exam, prior fasting for blood work.  Ria Bush, MD

## 2019-04-18 NOTE — Assessment & Plan Note (Signed)
Statin intolerance. On welchol.  Lab Results  Component Value Date   LDLCALC 102 (H) 07/15/2018

## 2019-04-18 NOTE — Assessment & Plan Note (Signed)
Chronic, slight deterioration attributed to holiday season. Encouraged continued efforts at low sugar low carb diet.

## 2019-04-25 DIAGNOSIS — Z1211 Encounter for screening for malignant neoplasm of colon: Secondary | ICD-10-CM | POA: Diagnosis not present

## 2019-04-25 DIAGNOSIS — Z01419 Encounter for gynecological examination (general) (routine) without abnormal findings: Secondary | ICD-10-CM | POA: Diagnosis not present

## 2019-04-25 DIAGNOSIS — Z124 Encounter for screening for malignant neoplasm of cervix: Secondary | ICD-10-CM | POA: Diagnosis not present

## 2019-04-25 LAB — FECAL OCCULT BLOOD, GUAIAC: Fecal Occult Blood: NEGATIVE

## 2019-05-24 ENCOUNTER — Ambulatory Visit
Admission: RE | Admit: 2019-05-24 | Discharge: 2019-05-24 | Disposition: A | Discharge status: Home / Self Care | Payer: PPO | Source: Ambulatory Visit | Attending: Family Medicine | Admitting: Family Medicine

## 2019-05-24 ENCOUNTER — Other Ambulatory Visit: Payer: Self-pay | Admitting: Family Medicine

## 2019-05-24 DIAGNOSIS — Z1231 Encounter for screening mammogram for malignant neoplasm of breast: Secondary | ICD-10-CM | POA: Diagnosis not present

## 2019-05-24 DIAGNOSIS — R928 Other abnormal and inconclusive findings on diagnostic imaging of breast: Secondary | ICD-10-CM

## 2019-05-24 DIAGNOSIS — N6310 Unspecified lump in the right breast, unspecified quadrant: Secondary | ICD-10-CM

## 2019-05-24 IMAGING — MG DIGITAL SCREENING BILAT W/ TOMO W/ CAD
6 of 10 series · 6 of 30 positions shown · non-contrast
Comparison: Previous exams.

CLINICAL DATA: Screening.

EXAM:
DIGITAL SCREENING BILATERAL MAMMOGRAM WITH TOMO AND CAD

[L MLO synth-2D]
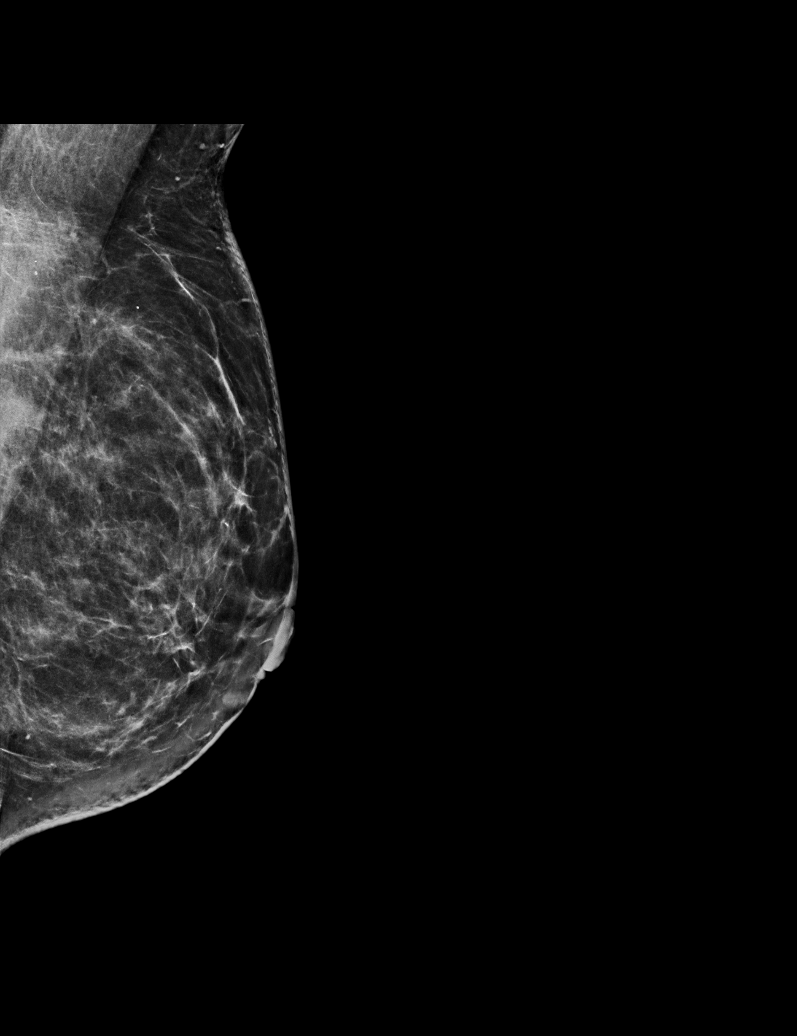

[R MLO synth-2D]
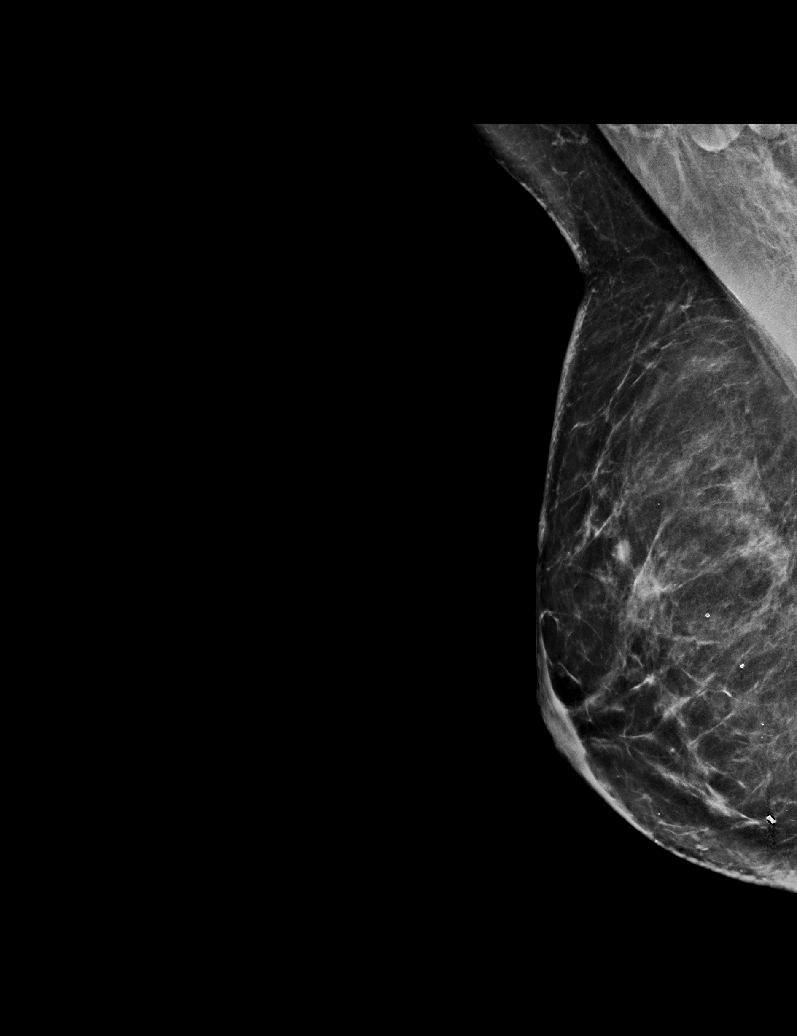

[R XCCL synth-2D]
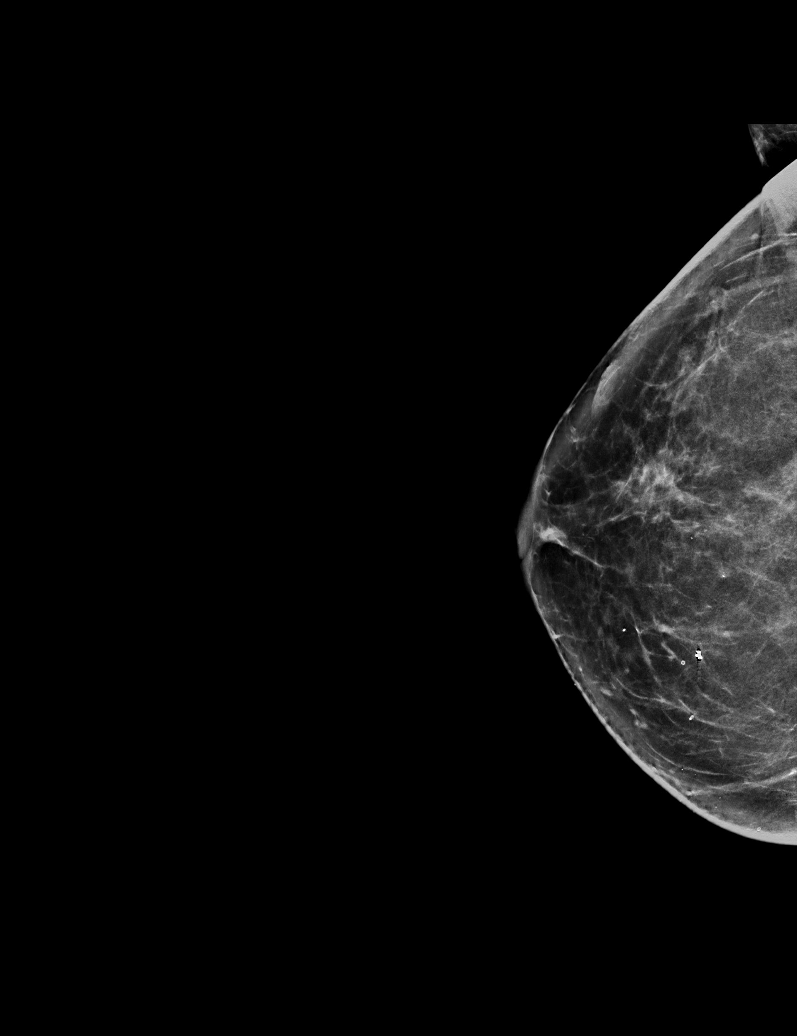

[R CC synth-2D]
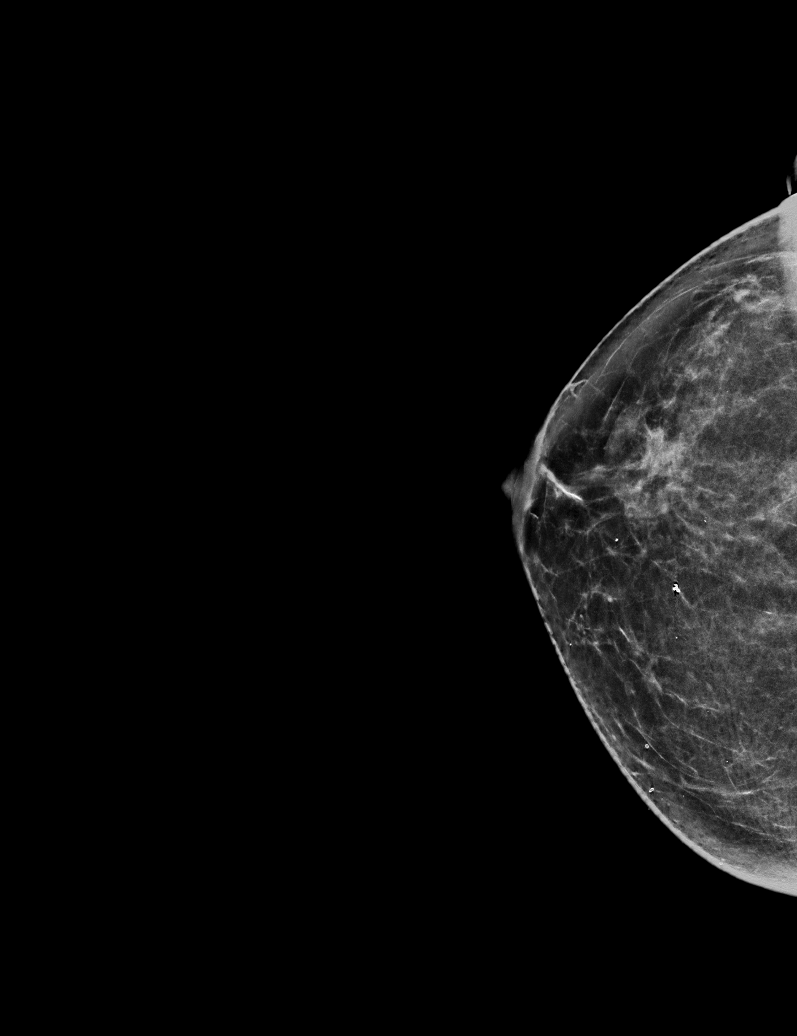

[L CC synth-2D]
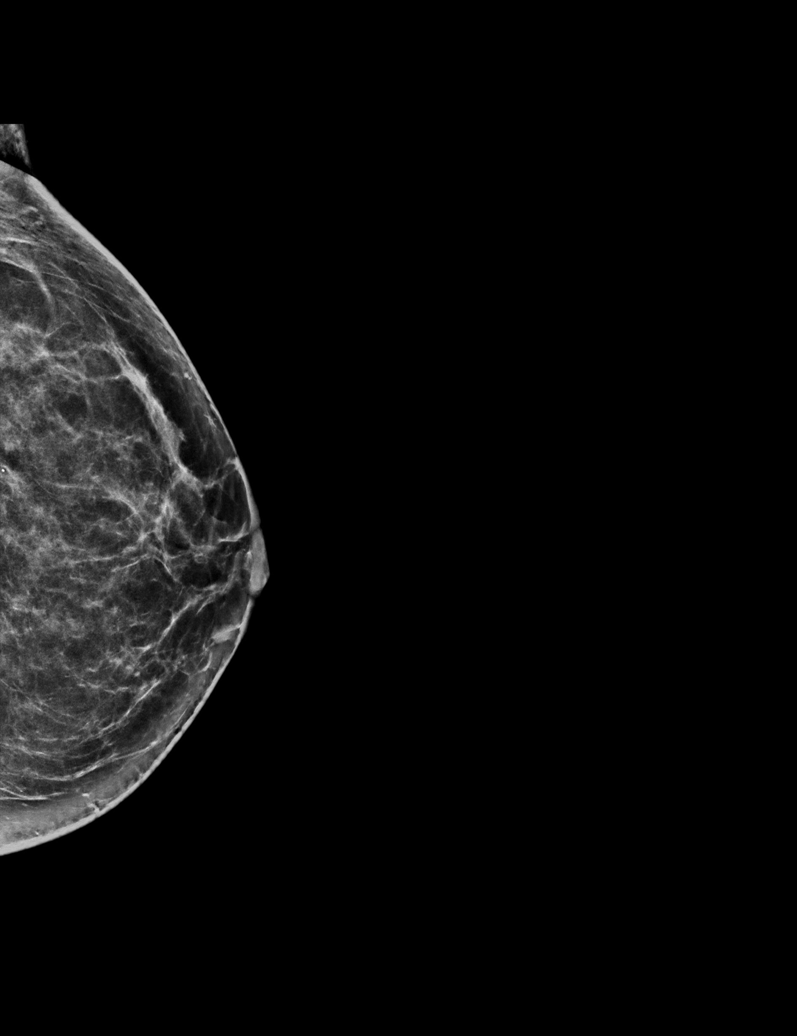

[L CC tomo · tomo slice 31/62.0]
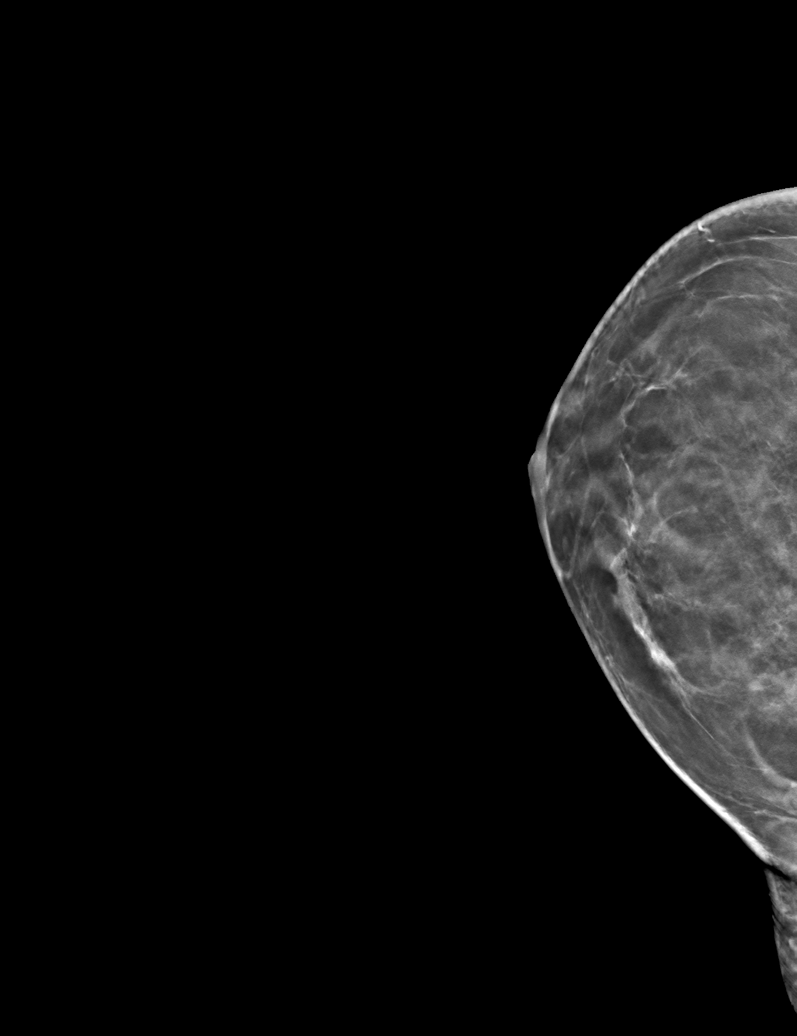

[6 of 30 positions shown; findings below may reference images not displayed]

ACR Breast Density Category c: The breast tissue is heterogeneously
dense, which may obscure small masses.
FINDINGS: In the right breast, a possible mass warrants further evaluation. In
the left breast, no findings suspicious for malignancy. Images were
processed with CAD.
IMPRESSION: Further evaluation is suggested for possible mass in the right
breast.

RECOMMENDATION:
Diagnostic mammogram and possibly ultrasound of the right breast.
(Code:[9M])

The patient will be contacted regarding the findings, and additional
imaging will be scheduled.

BI-RADS CATEGORY  0: Incomplete. Need additional imaging evaluation
and/or prior mammograms for comparison.

## 2019-05-25 ENCOUNTER — Encounter: Payer: Self-pay | Admitting: Family Medicine

## 2019-06-01 ENCOUNTER — Ambulatory Visit
Admission: RE | Admit: 2019-06-01 | Discharge: 2019-06-01 | Disposition: A | Discharge status: Home / Self Care | Payer: PPO | Source: Ambulatory Visit | Attending: Family Medicine | Admitting: Family Medicine

## 2019-06-01 ENCOUNTER — Encounter: Payer: Self-pay | Admitting: Family Medicine

## 2019-06-01 DIAGNOSIS — R928 Other abnormal and inconclusive findings on diagnostic imaging of breast: Secondary | ICD-10-CM | POA: Diagnosis not present

## 2019-06-01 DIAGNOSIS — N6310 Unspecified lump in the right breast, unspecified quadrant: Secondary | ICD-10-CM

## 2019-06-01 DIAGNOSIS — R922 Inconclusive mammogram: Secondary | ICD-10-CM | POA: Diagnosis not present

## 2019-06-01 DIAGNOSIS — N6311 Unspecified lump in the right breast, upper outer quadrant: Secondary | ICD-10-CM | POA: Diagnosis not present

## 2019-06-01 IMAGING — US US BREAST*R* LIMITED INC AXILLA
2 series · 11 of 11 positions shown · non-contrast
Comparison: Previous exam(s).

CLINICAL DATA: 68-year-old female presenting as a recall from
screening for possible right breast mass.

EXAM:
DIGITAL DIAGNOSTIC RIGHT MAMMOGRAM WITH TOMO
ULTRASOUND RIGHT BREAST

[Series 1: us breast*right* limited inc axilla · 0.05mm/px · 7 of 7 slices shown (1 of 2)]
[im 1/7]
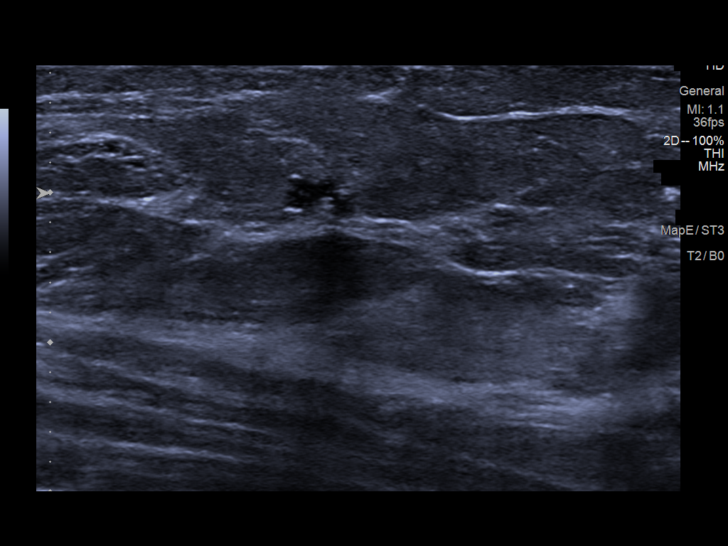
[im 2/7]
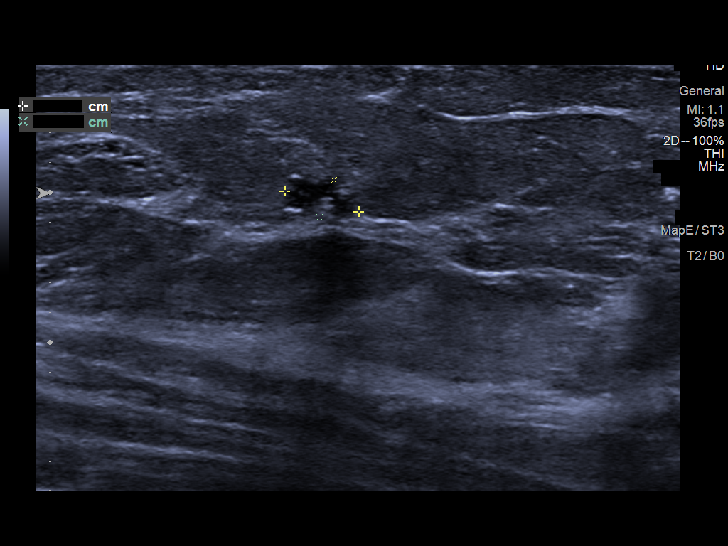
[im 3/7]
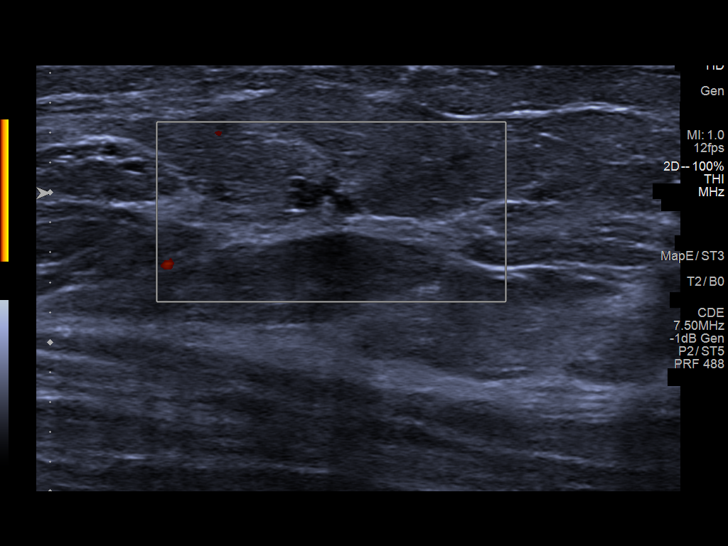
[im 4/7]
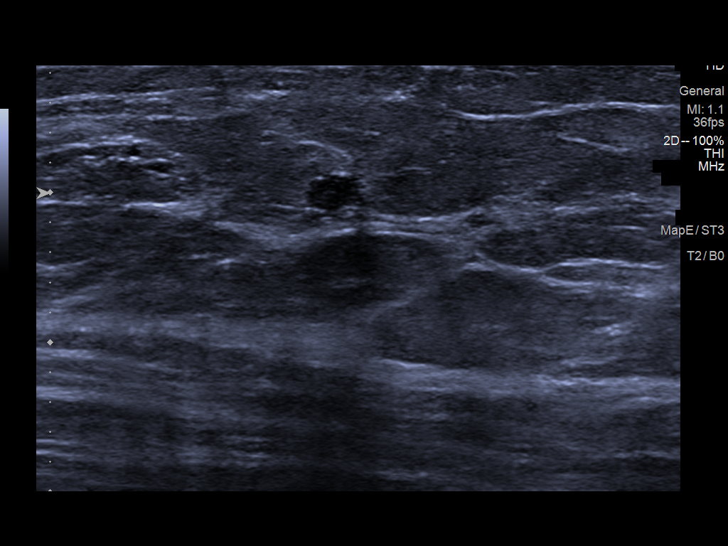
[im 5/7]
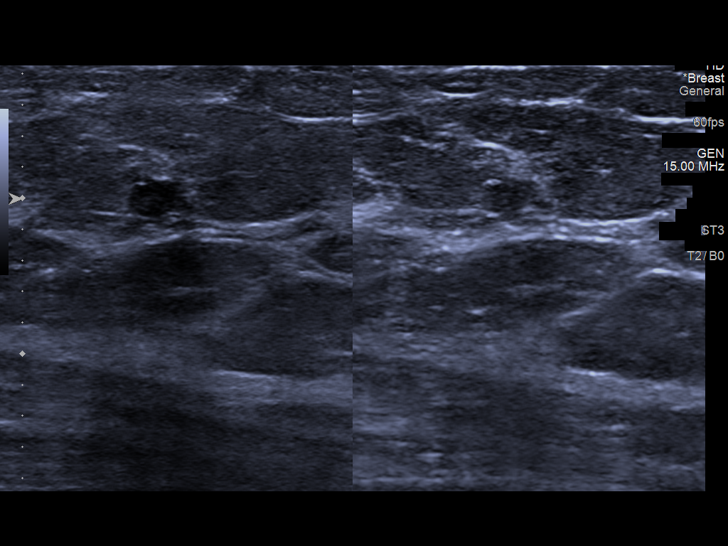
[im 6/7]
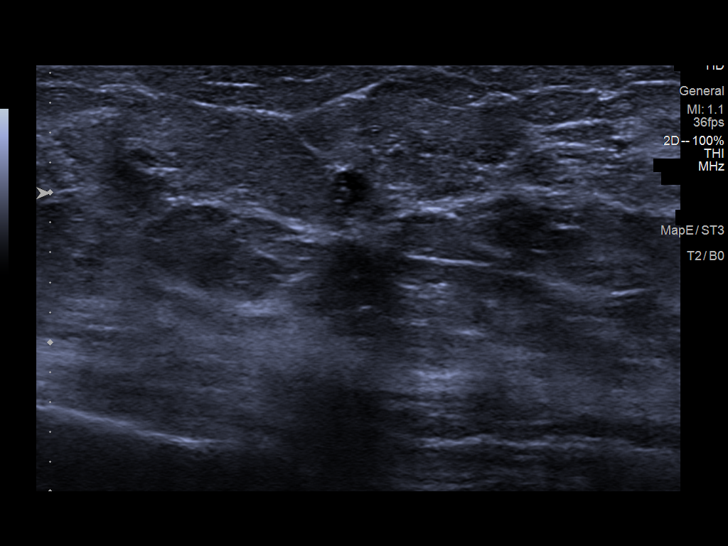
[im 7/7]
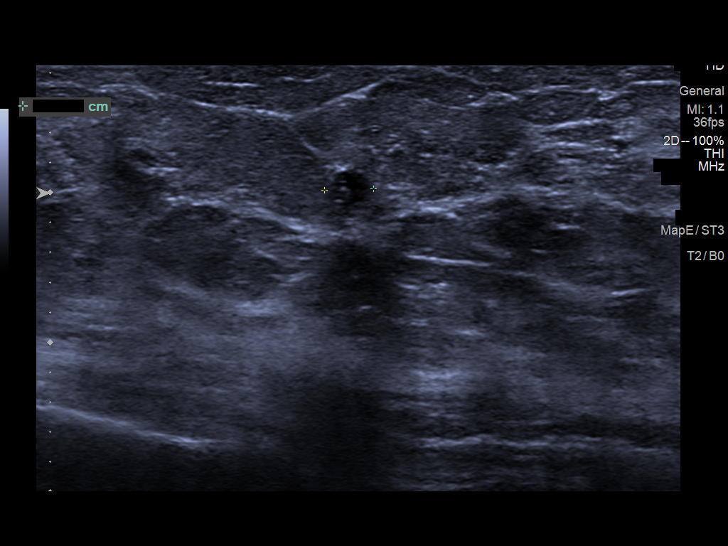

[Series 2: us breast*right* limited inc axilla · 0.06mm/px · 4 of 4 slices shown (2 of 2)]
[im 1/4]
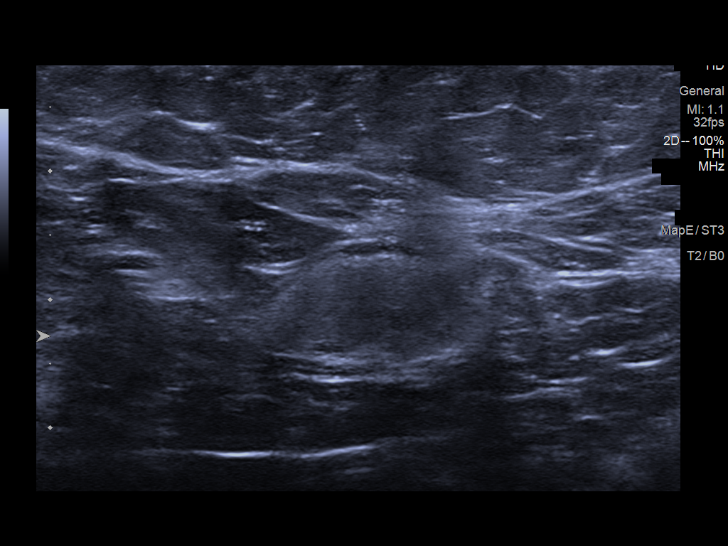
[im 2/4]
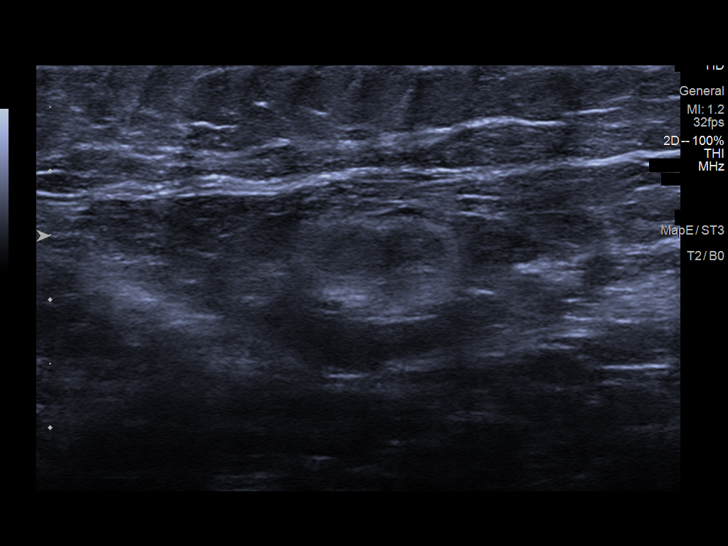
[im 3/4]
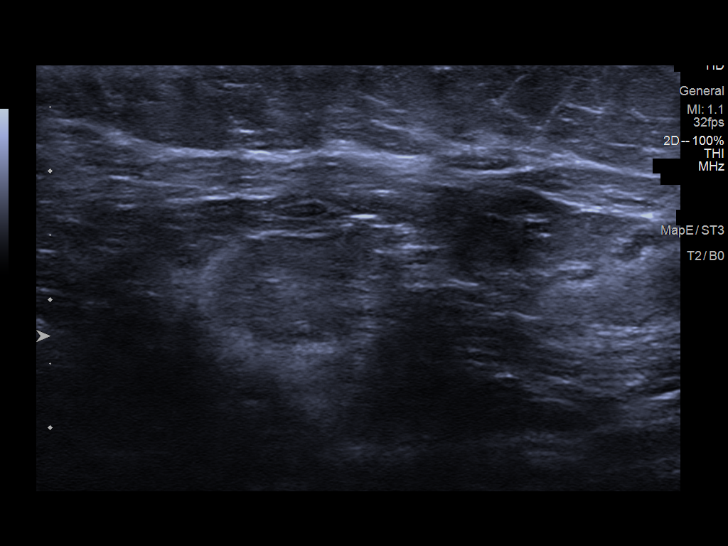
[im 4/4]
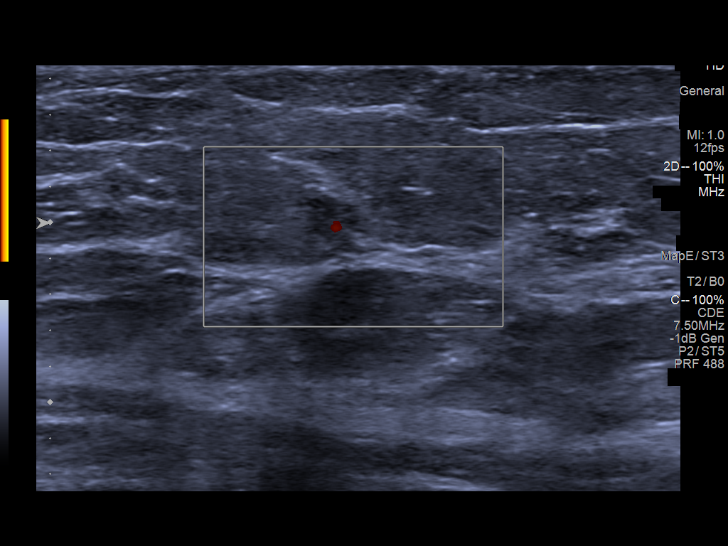

[11 of 11 positions shown; findings below may reference images not displayed]

ACR Breast Density Category c: The breast tissue is heterogeneously
dense, which may obscure small masses.
FINDINGS: Mammogram:

Full field and spot compression tomosynthesis views of the right
breast were performed. On the additional imaging there is
persistence of a small mass in the upper outer quadrant of the right
breast measuring approximately 0.4 cm.

Ultrasound:

Targeted ultrasound is performed in the right breast at 9:30 o'clock
3 cm from the nipple demonstrating an irregular hypoechoic mass
measuring 0.5 x 0.3 x 0.3 cm. There is a small amount of internal
vascularity. This corresponds to the mass seen mammographically.
Targeted ultrasound of the right axilla demonstrates
normal-appearing lymph nodes.
IMPRESSION: Right breast mass at 9:30 o'clock measuring 0.5 cm is indeterminate.

RECOMMENDATION:
Ultrasound-guided core needle biopsy of the right breast mass at
9:30 o'clock. The patient will be called to schedule this biopsy.

BI-RADS CATEGORY  4: Suspicious.

## 2019-06-01 IMAGING — MG MM DIGITAL DIAGNOSTIC UNILAT*R* W/ TOMO W/ CAD
6 series · 6 of 18 positions shown · non-contrast
Comparison: Previous exam(s).

CLINICAL DATA: 68-year-old female presenting as a recall from
screening for possible right breast mass.

EXAM:
DIGITAL DIAGNOSTIC RIGHT MAMMOGRAM WITH TOMO
ULTRASOUND RIGHT BREAST

[R ML synth-2D]
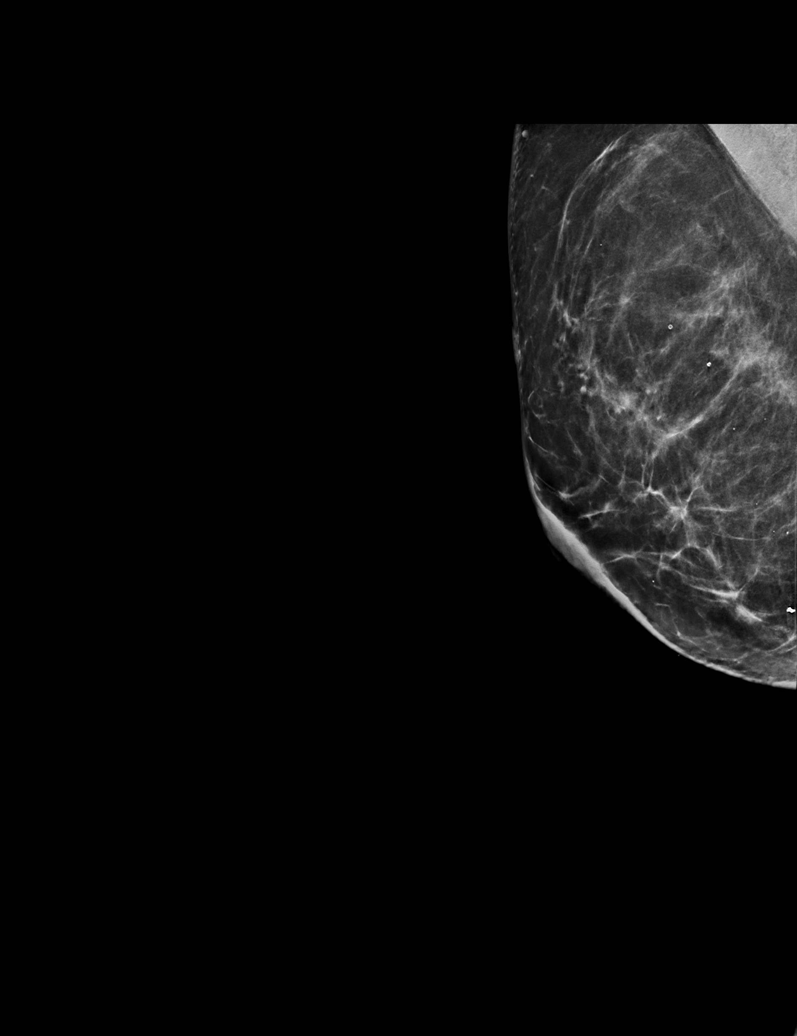

[R CC synth-2D]
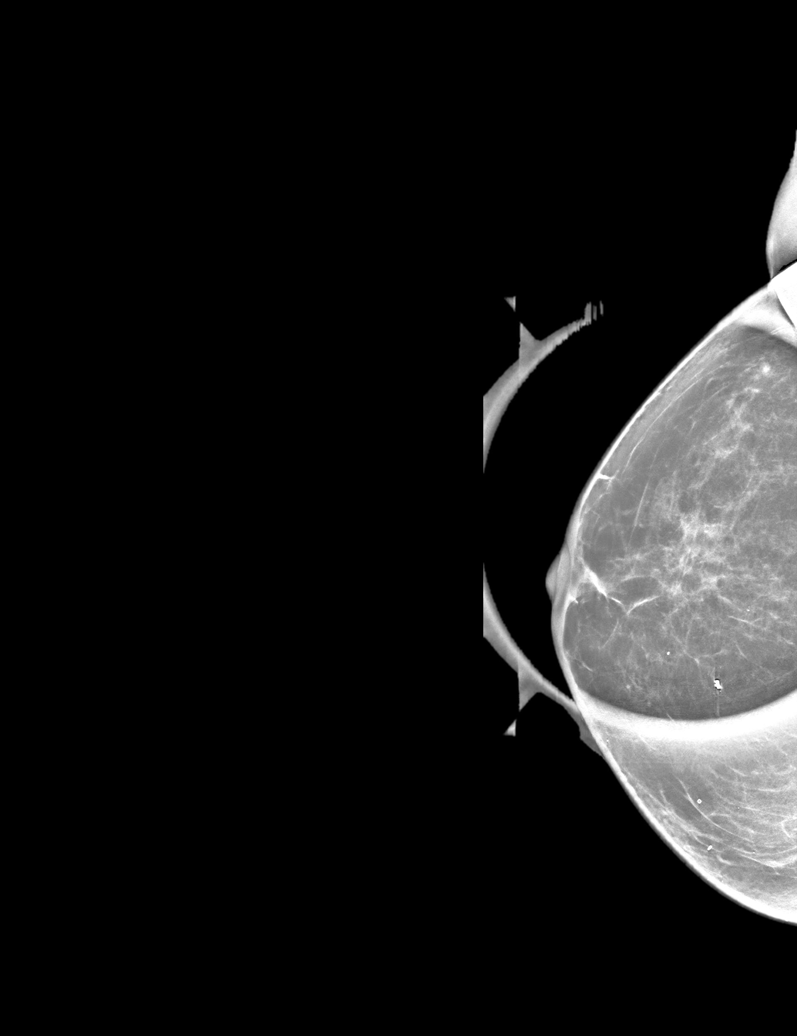

[R MLO synth-2D]
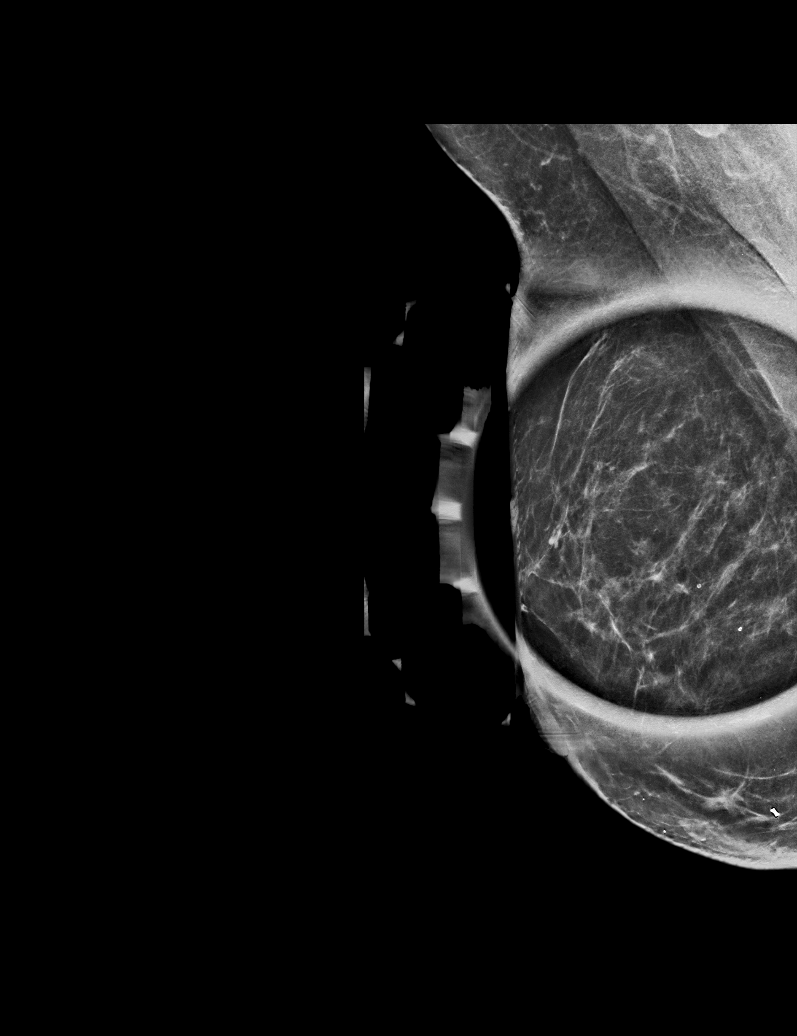

[R MLO tomo · tomo slice 31/60.0]
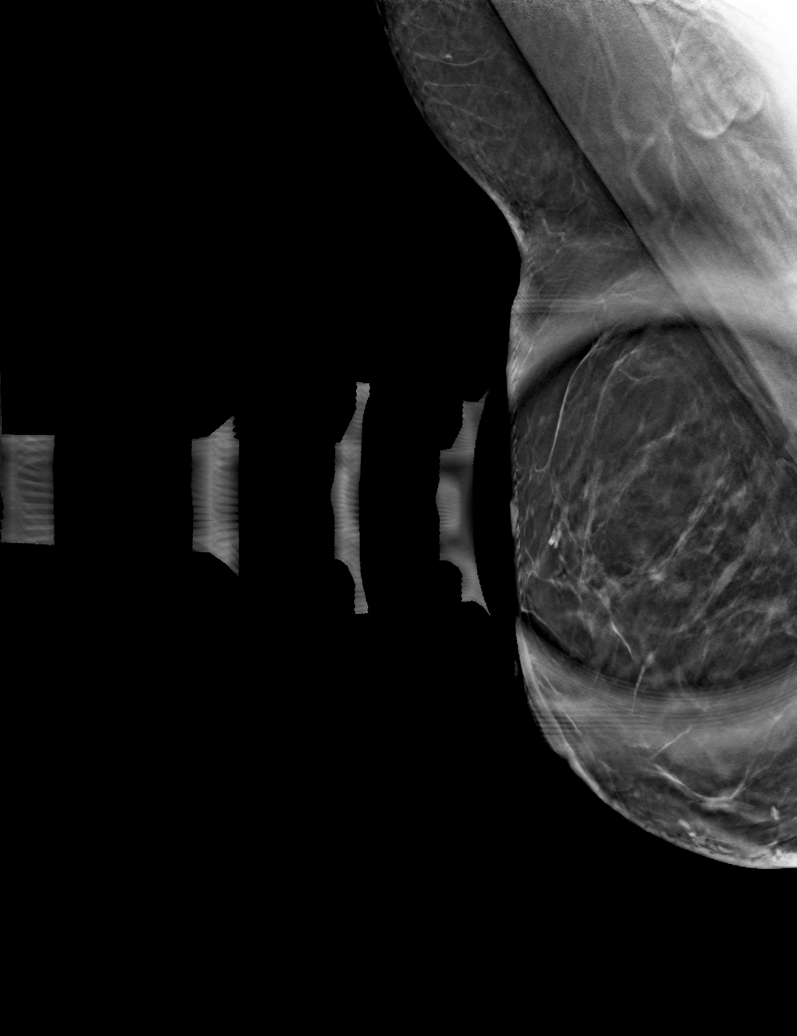

[R ML tomo · tomo slice 34/67.0]
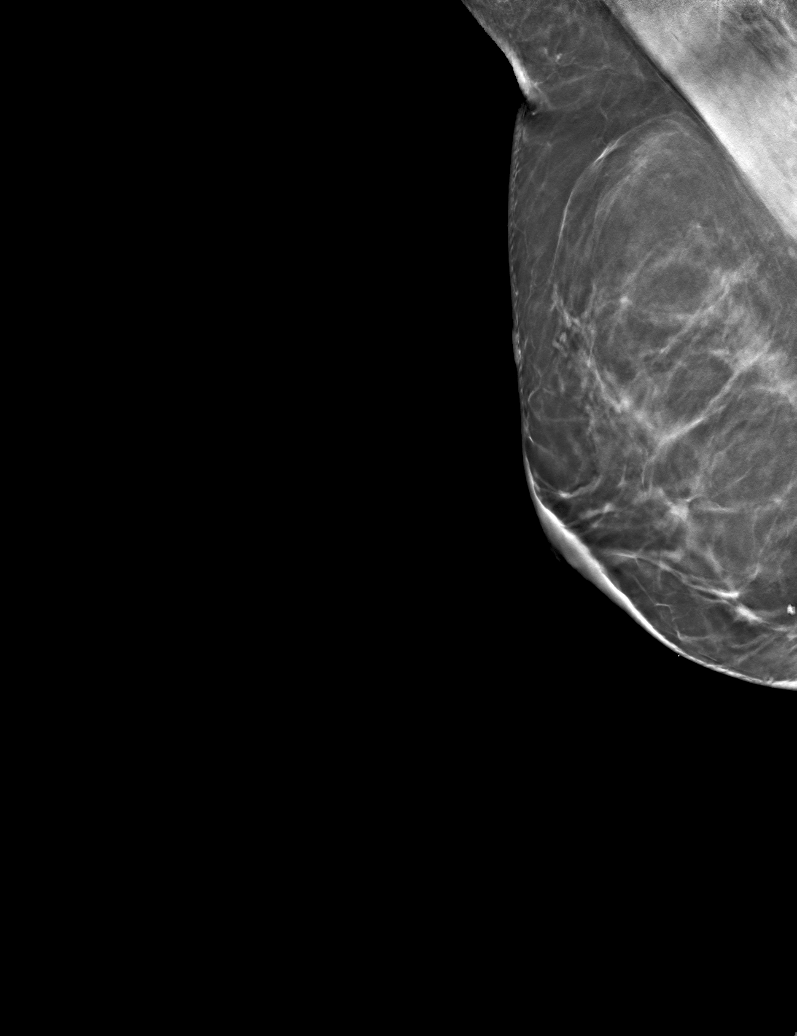

[R CC tomo · tomo slice 27/53.0]
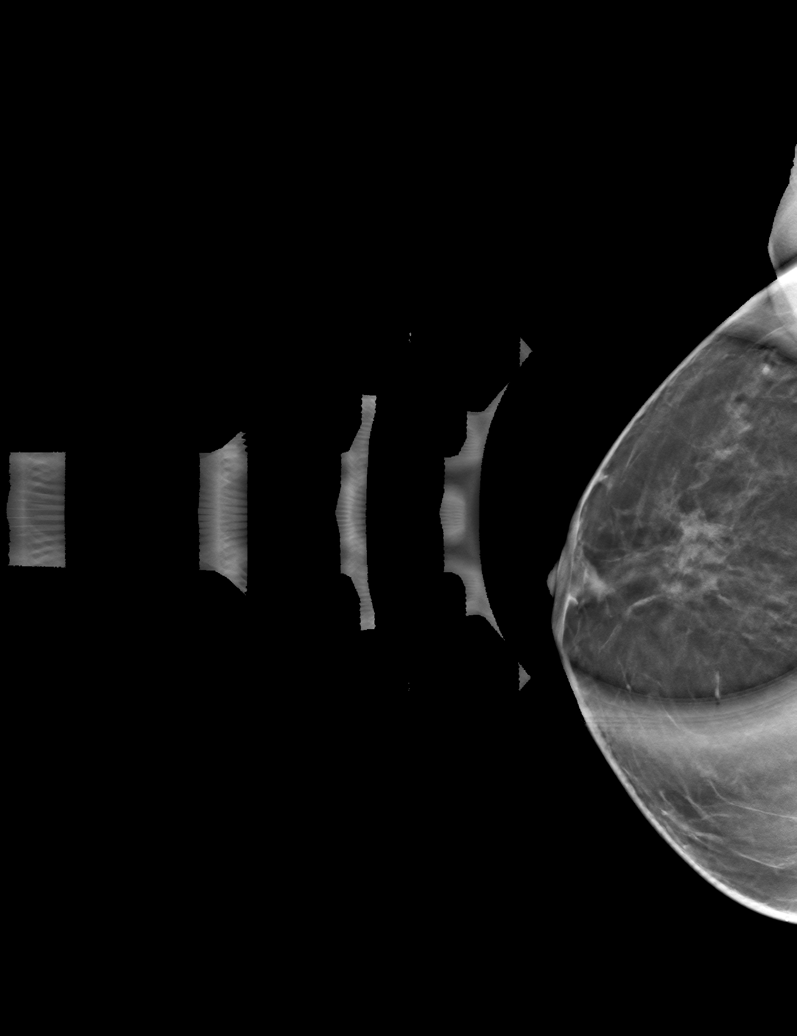

[6 of 18 positions shown; findings below may reference images not displayed]

ACR Breast Density Category c: The breast tissue is heterogeneously
dense, which may obscure small masses.
FINDINGS: Mammogram:

Full field and spot compression tomosynthesis views of the right
breast were performed. On the additional imaging there is
persistence of a small mass in the upper outer quadrant of the right
breast measuring approximately 0.4 cm.

Ultrasound:

Targeted ultrasound is performed in the right breast at 9:30 o'clock
3 cm from the nipple demonstrating an irregular hypoechoic mass
measuring 0.5 x 0.3 x 0.3 cm. There is a small amount of internal
vascularity. This corresponds to the mass seen mammographically.
Targeted ultrasound of the right axilla demonstrates
normal-appearing lymph nodes.
IMPRESSION: Right breast mass at 9:30 o'clock measuring 0.5 cm is indeterminate.

RECOMMENDATION:
Ultrasound-guided core needle biopsy of the right breast mass at
9:30 o'clock. The patient will be called to schedule this biopsy.

BI-RADS CATEGORY  4: Suspicious.

## 2019-06-02 ENCOUNTER — Other Ambulatory Visit: Payer: Self-pay | Admitting: Family Medicine

## 2019-06-02 DIAGNOSIS — R928 Other abnormal and inconclusive findings on diagnostic imaging of breast: Secondary | ICD-10-CM

## 2019-06-02 NOTE — Telephone Encounter (Signed)
Any way to expedite biopsy? Pt's mother is in hospice in [place]

## 2019-06-04 ENCOUNTER — Encounter: Payer: Self-pay | Admitting: Family Medicine

## 2019-06-07 ENCOUNTER — Ambulatory Visit
Admission: RE | Admit: 2019-06-07 | Discharge: 2019-06-07 | Disposition: A | Discharge status: Home / Self Care | Payer: PPO | Source: Ambulatory Visit | Attending: Family Medicine | Admitting: Family Medicine

## 2019-06-07 ENCOUNTER — Ambulatory Visit: Payer: PPO

## 2019-06-07 ENCOUNTER — Other Ambulatory Visit: Payer: PPO

## 2019-06-07 DIAGNOSIS — R928 Other abnormal and inconclusive findings on diagnostic imaging of breast: Secondary | ICD-10-CM

## 2019-06-07 DIAGNOSIS — N6311 Unspecified lump in the right breast, upper outer quadrant: Secondary | ICD-10-CM | POA: Diagnosis not present

## 2019-06-07 HISTORY — PX: BREAST BIOPSY: SHX20

## 2019-06-07 IMAGING — MG MM BREAST LOCALIZATION CLIP
6 series · 6 of 18 positions shown · non-contrast
Comparison: Previous exam(s).

CLINICAL DATA: Evaluate biopsy marker

EXAM:
DIAGNOSTIC RIGHT MAMMOGRAM POST ULTRASOUND BIOPSY

[R ML synth-2D]
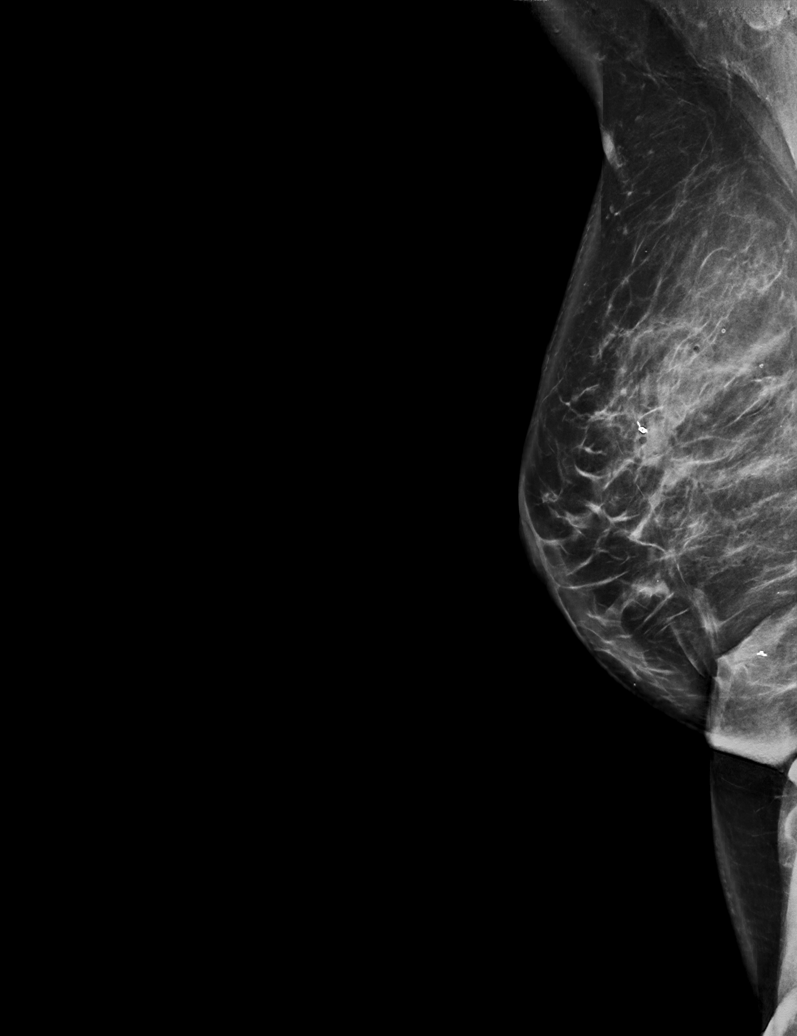

[R CC synth-2D (1 of 2)]
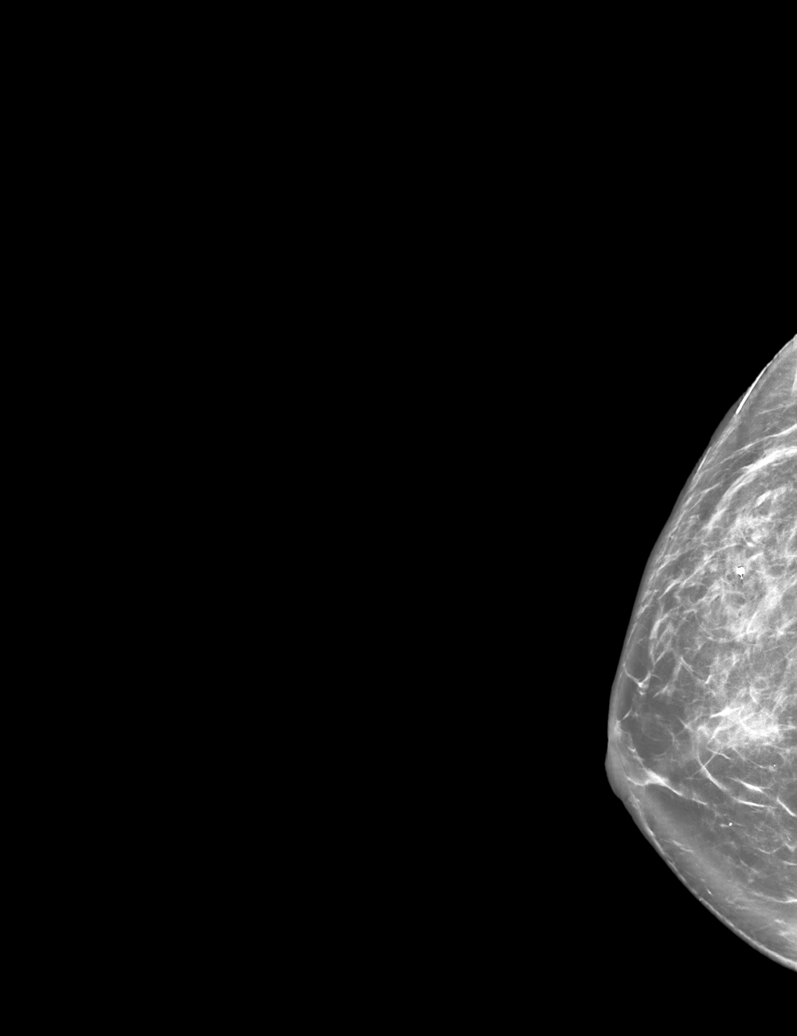

[R CC synth-2D (2 of 2)]
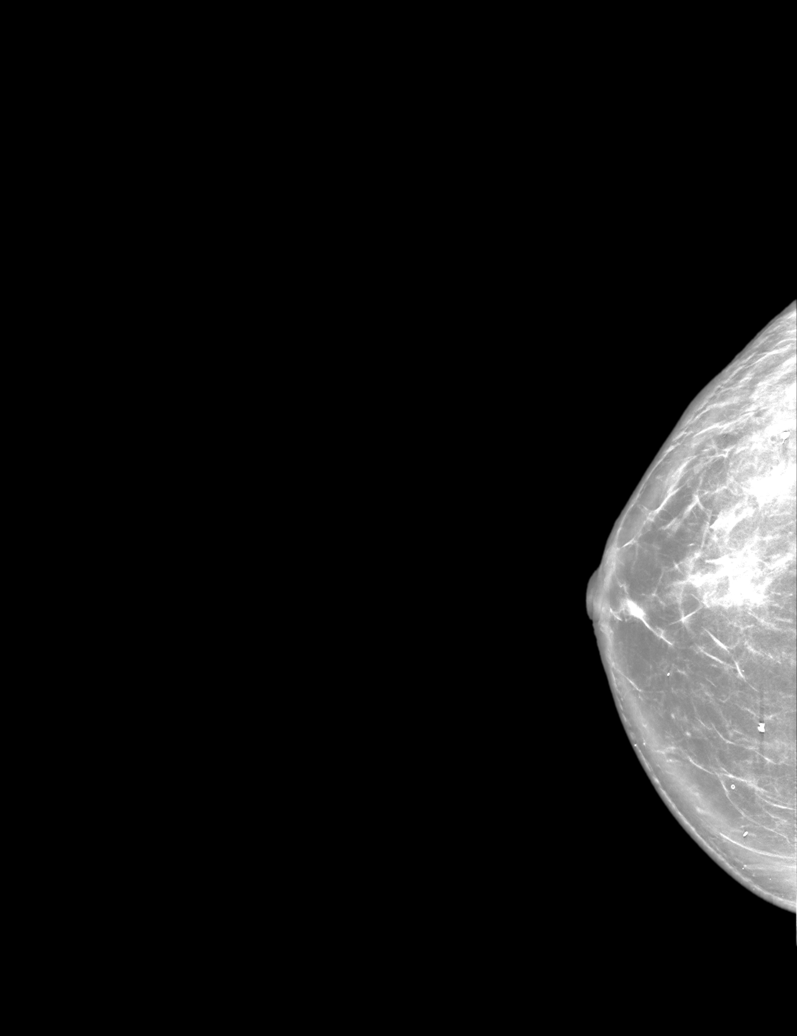

[R ML tomo · tomo slice 47/93.0]
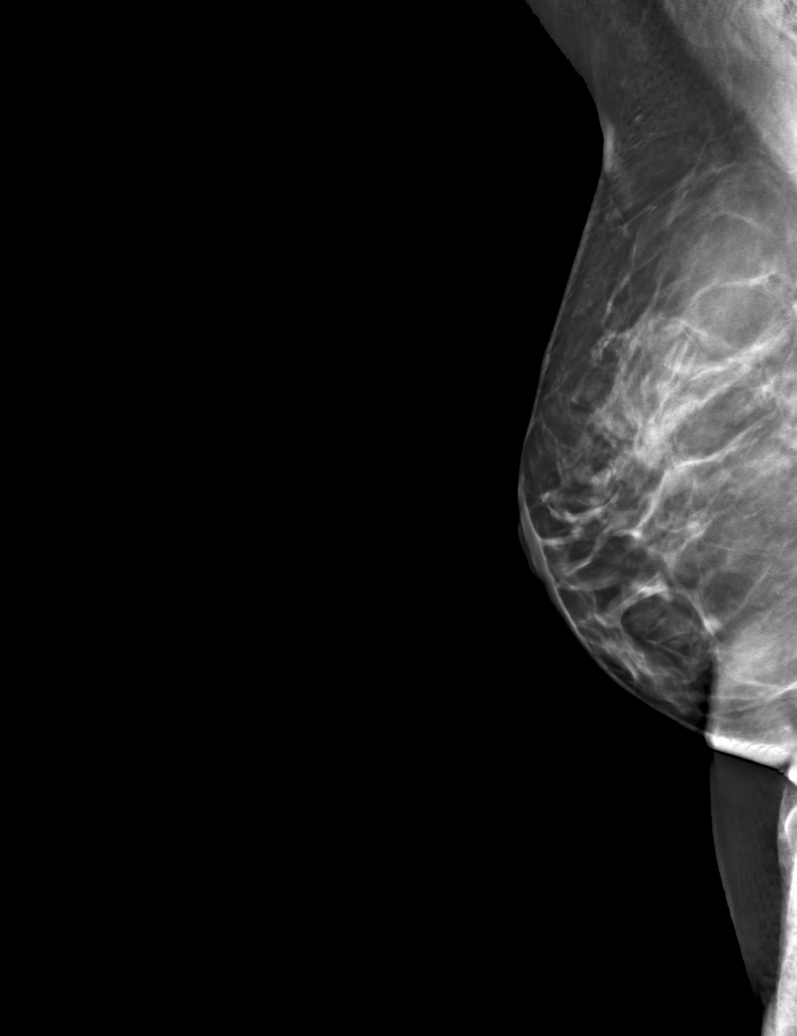

[R CC tomo (1 of 2) · tomo slice 43/85.0]
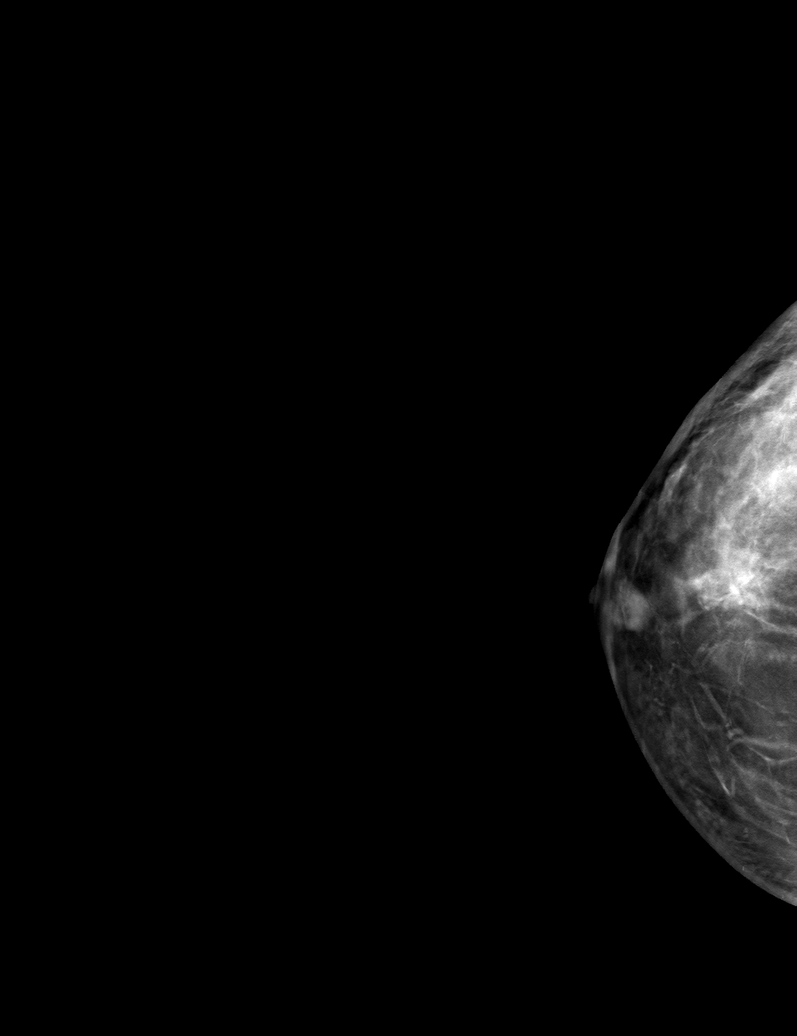

[R CC tomo (2 of 2) · tomo slice 41/81.0]
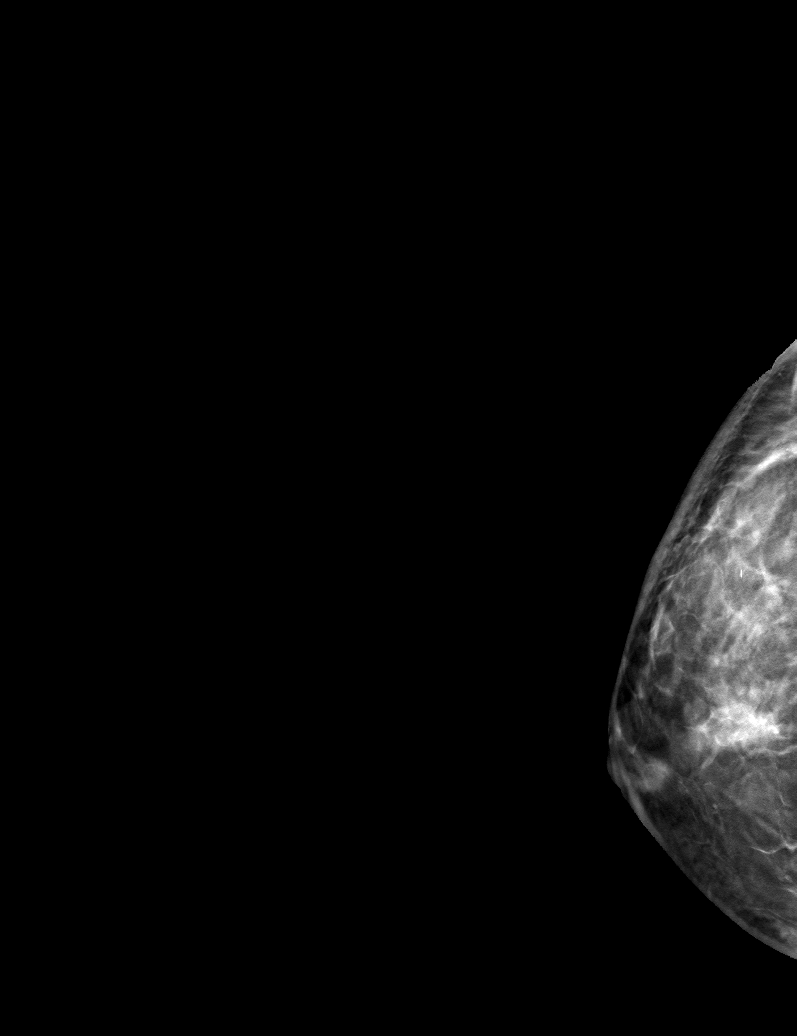

[6 of 18 positions shown; findings below may reference images not displayed]

FINDINGS: Mammographic images were obtained following ultrasound guided biopsy
of a right breast mass. It was difficult to see the mass by the end
of the biopsy due to hematoma. As a result, the clip was placed in
the approximate position of the breast mass. The clip appears to be
in good position and I believe the mammographic and sonographic
findings do correlate.
IMPRESSION: Appropriate positioning of the coil shaped biopsy marking clip at
the site of biopsy in the location.

Final Assessment: Post Procedure Mammograms for Marker Placement

## 2019-06-07 IMAGING — MG US  BREAST BX W/ LOC DEV 1ST LESION IMG BX SPEC US GUIDE*R*
1 series · 8 of 8 positions shown · non-contrast
Comparison: Previous exam(s).
COMPARISON: Previous exam(s).

Addendum:
CLINICAL DATA: Biopsy of a right breast mass at [DATE]

EXAM:
ULTRASOUND GUIDED RIGHT BREAST CORE NEEDLE BIOPSY

[Series 1: MG view · 0.05mm/px · 8 of 10 slices shown]
[im 1/10]
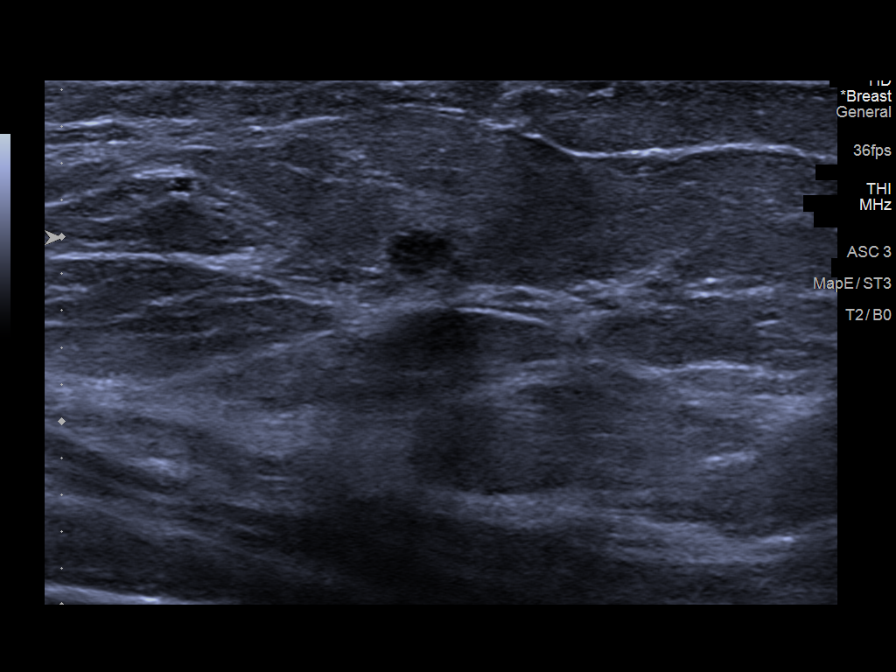
[im 2/10]
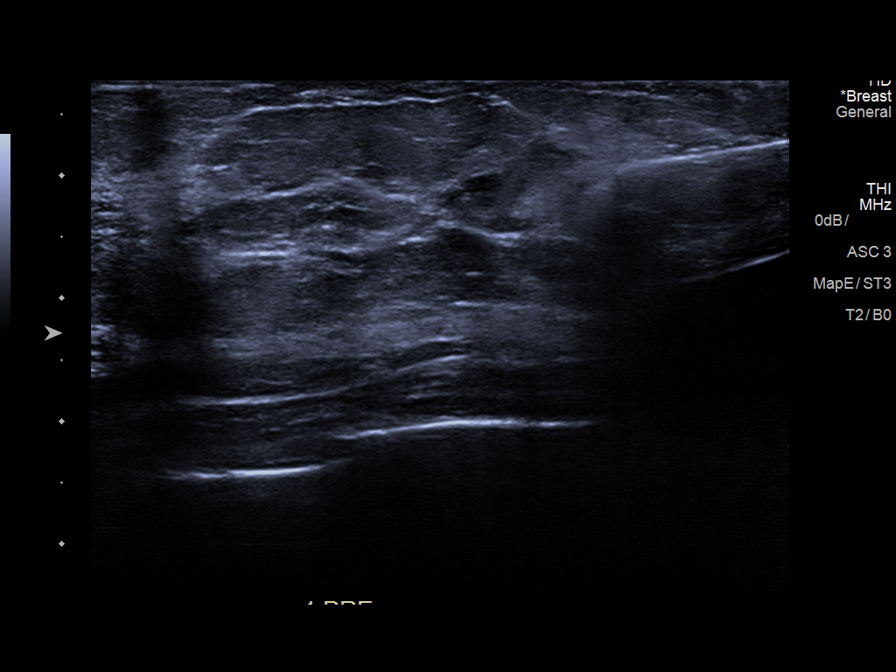
[im 3/10]
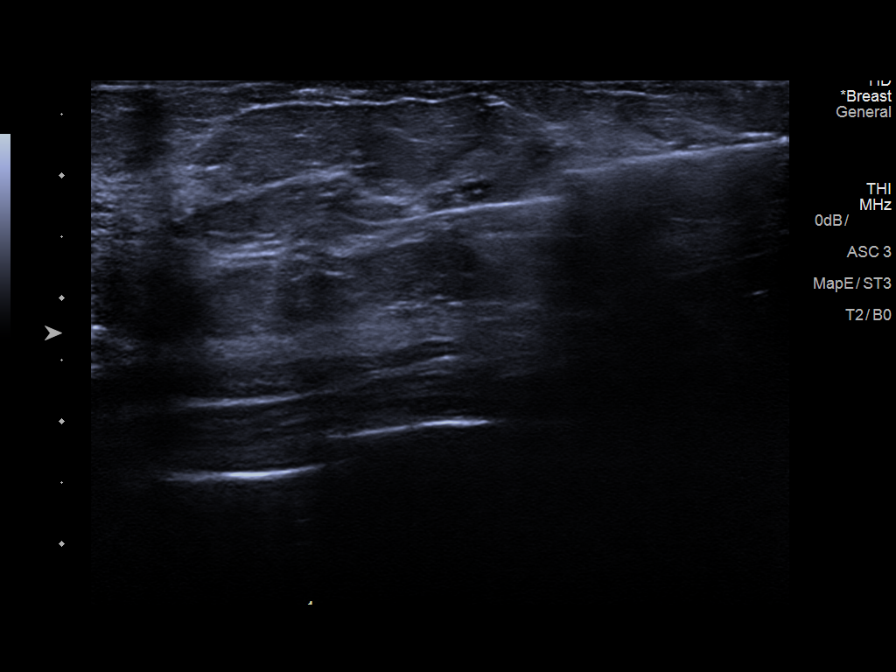
[im 4/10]
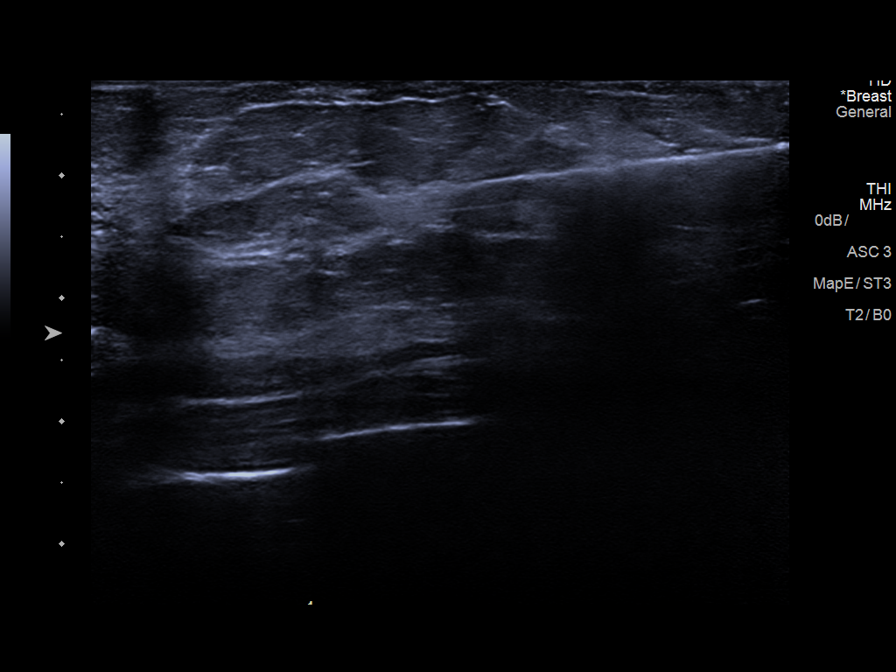
[im 6/10]
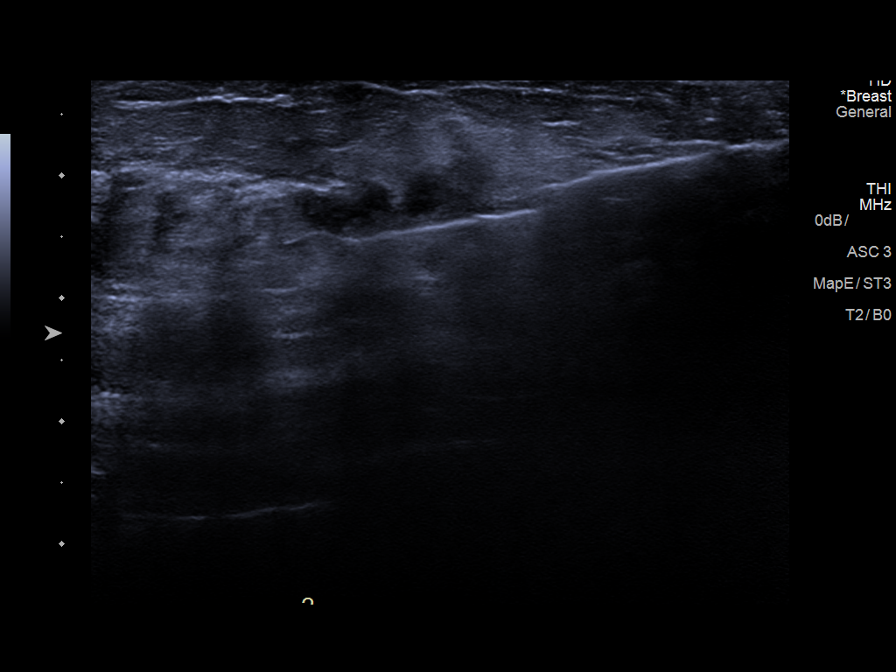
[im 7/10]
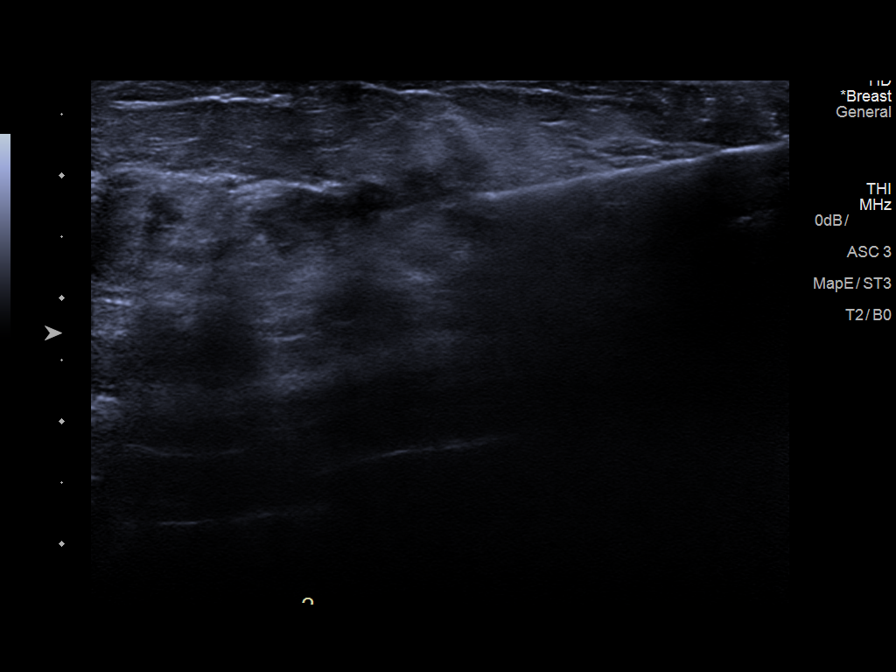
[im 8/10]
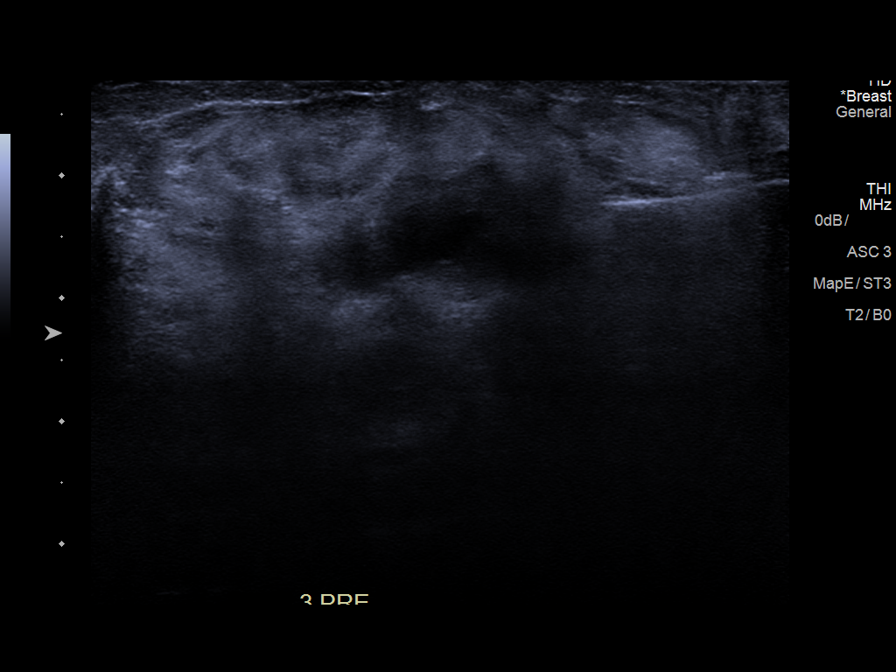
[im 10/10]
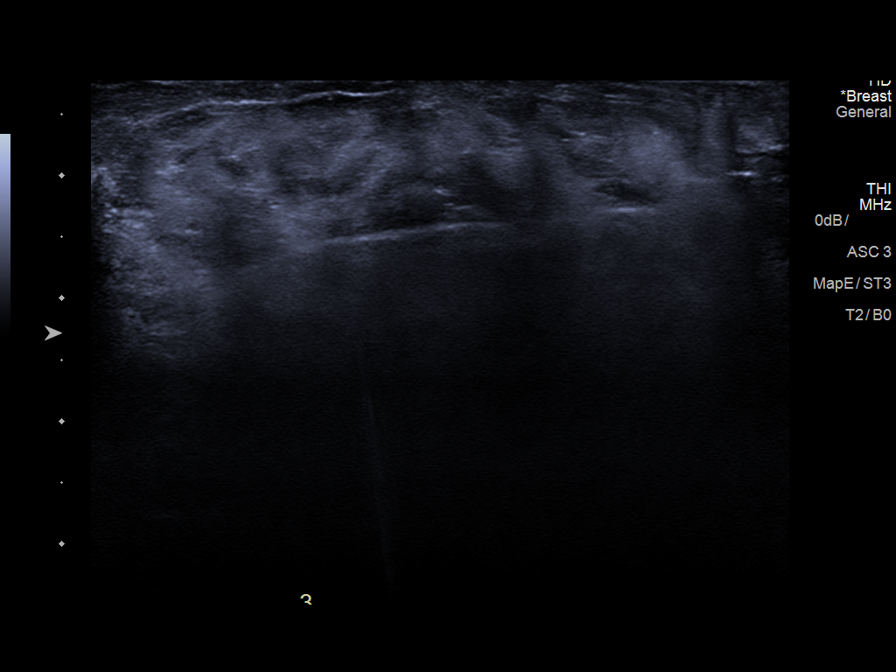

[8 of 8 positions shown; findings below may reference images not displayed]



Lesion quadrant: Upper outer

Using sterile technique and 1% Lidocaine as local anesthetic, under
direct ultrasound visualization, a 12 gauge NARIYOSHI device was
used to perform biopsy of a 930 right breast mass using a lateral
approach. At the conclusion of the procedure coil shaped tissue
marker clip was deployed into the biopsy cavity. Follow up 2 view
mammogram was performed and dictated separately.
IMPRESSION: Ultrasound guided biopsy of a right breast mass at [DATE]. No apparent
complications.

ADDENDUM:
PATHOLOGY revealed: A. RIGHT BREAST, MASS; ULTRASOUND-GUIDED BIOPSY:
- FIBROCYSTIC CHANGES WITH APOCRINE METAPLASIA. - NEGATIVE FOR
ATYPIA AND MALIGNANCY.

Comment: Adjacent cores of tissue contain medium-sized vascular
structures with hemorrhage, consistent with the hematoma mentioned
in the post-biopsy mammogram report.

Pathology results are CONCORDANT with imaging findings, per Dr.
NARIYOSHI.

Pathology results and recommendations below were discussed with
patient by telephone on [DATE]. Patient reported biopsy site
doing well with slight tenderness at the site. Post biopsy care
instructions were reviewed and questions were answered. Patient was
instructed to call [HOSPITAL] if any concerns or
questions arise related to the biopsy.

Recommendation: Return to annual bilateral screening mammogram in
[DATE].

Addendum by NARIYOSHI RN on [DATE].



Lesion quadrant: Upper outer

Using sterile technique and 1% Lidocaine as local anesthetic, under
direct ultrasound visualization, a 12 gauge NARIYOSHI device was
used to perform biopsy of a 930 right breast mass using a lateral
approach. At the conclusion of the procedure coil shaped tissue
marker clip was deployed into the biopsy cavity. Follow up 2 view
mammogram was performed and dictated separately.
IMPRESSION: Ultrasound guided biopsy of a right breast mass at [DATE]. No apparent
complications.

## 2019-06-08 ENCOUNTER — Ambulatory Visit: Payer: PPO

## 2019-06-08 LAB — SURGICAL PATHOLOGY

## 2019-06-11 ENCOUNTER — Encounter: Payer: Self-pay | Admitting: Family Medicine

## 2019-07-28 ENCOUNTER — Other Ambulatory Visit: Payer: Self-pay

## 2019-07-28 ENCOUNTER — Other Ambulatory Visit (INDEPENDENT_AMBULATORY_CARE_PROVIDER_SITE_OTHER): Payer: PPO

## 2019-07-28 ENCOUNTER — Other Ambulatory Visit: Payer: Self-pay | Admitting: Family Medicine

## 2019-07-28 DIAGNOSIS — R946 Abnormal results of thyroid function studies: Secondary | ICD-10-CM

## 2019-07-28 DIAGNOSIS — E785 Hyperlipidemia, unspecified: Secondary | ICD-10-CM

## 2019-07-28 DIAGNOSIS — E1165 Type 2 diabetes mellitus with hyperglycemia: Secondary | ICD-10-CM

## 2019-07-28 DIAGNOSIS — E1169 Type 2 diabetes mellitus with other specified complication: Secondary | ICD-10-CM

## 2019-07-28 LAB — COMPREHENSIVE METABOLIC PANEL
ALT: 14 U/L (ref 0–35)
AST: 12 U/L (ref 0–37)
Albumin: 4.5 g/dL (ref 3.5–5.2)
Alkaline Phosphatase: 51 U/L (ref 39–117)
BUN: 20 mg/dL (ref 6–23)
CO2: 31 mEq/L (ref 19–32)
Calcium: 9.8 mg/dL (ref 8.4–10.5)
Chloride: 104 mEq/L (ref 96–112)
Creatinine, Ser: 0.72 mg/dL (ref 0.40–1.20)
GFR: 80.19 mL/min (ref >60.00)
Glucose, Bld: 119 mg/dL — ABNORMAL HIGH (ref 70–99)
Potassium: 4.1 mEq/L (ref 3.5–5.1)
Sodium: 139 mEq/L (ref 135–145)
Total Bilirubin: 0.6 mg/dL (ref 0.2–1.2)
Total Protein: 6.7 g/dL (ref 6.0–8.3)

## 2019-07-28 LAB — LIPID PANEL
Cholesterol: 183 mg/dL (ref 0–200)
HDL: 46.8 mg/dL (ref >39.00)
LDL Cholesterol: 111 mg/dL — ABNORMAL HIGH (ref 0–99)
NonHDL: 136.58
Total CHOL/HDL Ratio: 4
Triglycerides: 130 mg/dL (ref 0.0–149.0)
VLDL: 26 mg/dL (ref 0.0–40.0)

## 2019-07-28 LAB — HEMOGLOBIN A1C: Hgb A1c MFr Bld: 7.3 % — ABNORMAL HIGH (ref 4.6–6.5)

## 2019-07-28 LAB — TSH: TSH: 0.58 u[IU]/mL (ref 0.35–4.50)

## 2019-07-28 LAB — T4, FREE: Free T4: 1.05 ng/dL (ref 0.60–1.60)

## 2019-08-04 ENCOUNTER — Ambulatory Visit (INDEPENDENT_AMBULATORY_CARE_PROVIDER_SITE_OTHER): Payer: PPO | Admitting: Family Medicine

## 2019-08-04 ENCOUNTER — Other Ambulatory Visit: Payer: Self-pay

## 2019-08-04 ENCOUNTER — Encounter: Payer: Self-pay | Admitting: Family Medicine

## 2019-08-04 ENCOUNTER — Ambulatory Visit (INDEPENDENT_AMBULATORY_CARE_PROVIDER_SITE_OTHER)
Admission: RE | Admit: 2019-08-04 | Discharge: 2019-08-04 | Disposition: A | Discharge status: Home / Self Care | Payer: PPO | Source: Ambulatory Visit | Attending: Family Medicine | Admitting: Family Medicine

## 2019-08-04 VITALS — BP 138/86 | HR 70 | Temp 97.8°F | Ht 69.0 in | Wt 165.5 lb

## 2019-08-04 DIAGNOSIS — R946 Abnormal results of thyroid function studies: Secondary | ICD-10-CM | POA: Diagnosis not present

## 2019-08-04 DIAGNOSIS — Z789 Other specified health status: Secondary | ICD-10-CM

## 2019-08-04 DIAGNOSIS — H4042X0 Glaucoma secondary to eye inflammation, left eye, stage unspecified: Secondary | ICD-10-CM

## 2019-08-04 DIAGNOSIS — I10 Essential (primary) hypertension: Secondary | ICD-10-CM | POA: Diagnosis not present

## 2019-08-04 DIAGNOSIS — T466X5A Adverse effect of antihyperlipidemic and antiarteriosclerotic drugs, initial encounter: Secondary | ICD-10-CM

## 2019-08-04 DIAGNOSIS — M545 Low back pain, unspecified: Secondary | ICD-10-CM

## 2019-08-04 DIAGNOSIS — R252 Cramp and spasm: Secondary | ICD-10-CM

## 2019-08-04 DIAGNOSIS — M8589 Other specified disorders of bone density and structure, multiple sites: Secondary | ICD-10-CM

## 2019-08-04 DIAGNOSIS — E1165 Type 2 diabetes mellitus with hyperglycemia: Secondary | ICD-10-CM | POA: Diagnosis not present

## 2019-08-04 DIAGNOSIS — G8929 Other chronic pain: Secondary | ICD-10-CM

## 2019-08-04 DIAGNOSIS — N6019 Diffuse cystic mastopathy of unspecified breast: Secondary | ICD-10-CM

## 2019-08-04 DIAGNOSIS — IMO0001 Reserved for inherently not codable concepts without codable children: Secondary | ICD-10-CM

## 2019-08-04 DIAGNOSIS — E785 Hyperlipidemia, unspecified: Secondary | ICD-10-CM

## 2019-08-04 DIAGNOSIS — N393 Stress incontinence (female) (male): Secondary | ICD-10-CM

## 2019-08-04 DIAGNOSIS — T85398A Other mechanical complication of other ocular prosthetic devices, implants and grafts, initial encounter: Secondary | ICD-10-CM

## 2019-08-04 DIAGNOSIS — H918X2 Other specified hearing loss, left ear: Secondary | ICD-10-CM | POA: Diagnosis not present

## 2019-08-04 DIAGNOSIS — H209 Unspecified iridocyclitis: Secondary | ICD-10-CM

## 2019-08-04 DIAGNOSIS — E1169 Type 2 diabetes mellitus with other specified complication: Secondary | ICD-10-CM | POA: Diagnosis not present

## 2019-08-04 DIAGNOSIS — Z0000 Encounter for general adult medical examination without abnormal findings: Secondary | ICD-10-CM

## 2019-08-04 DIAGNOSIS — Z85820 Personal history of malignant melanoma of skin: Secondary | ICD-10-CM

## 2019-08-04 IMAGING — DX DG LUMBAR SPINE COMPLETE 4+V
5 series · 5 of 5 positions shown · non-contrast
Comparison: None.

CLINICAL DATA: Chronic lower back pain.

EXAM:
LUMBAR SPINE - COMPLETE 4+ VIEW

[l-spine ap]
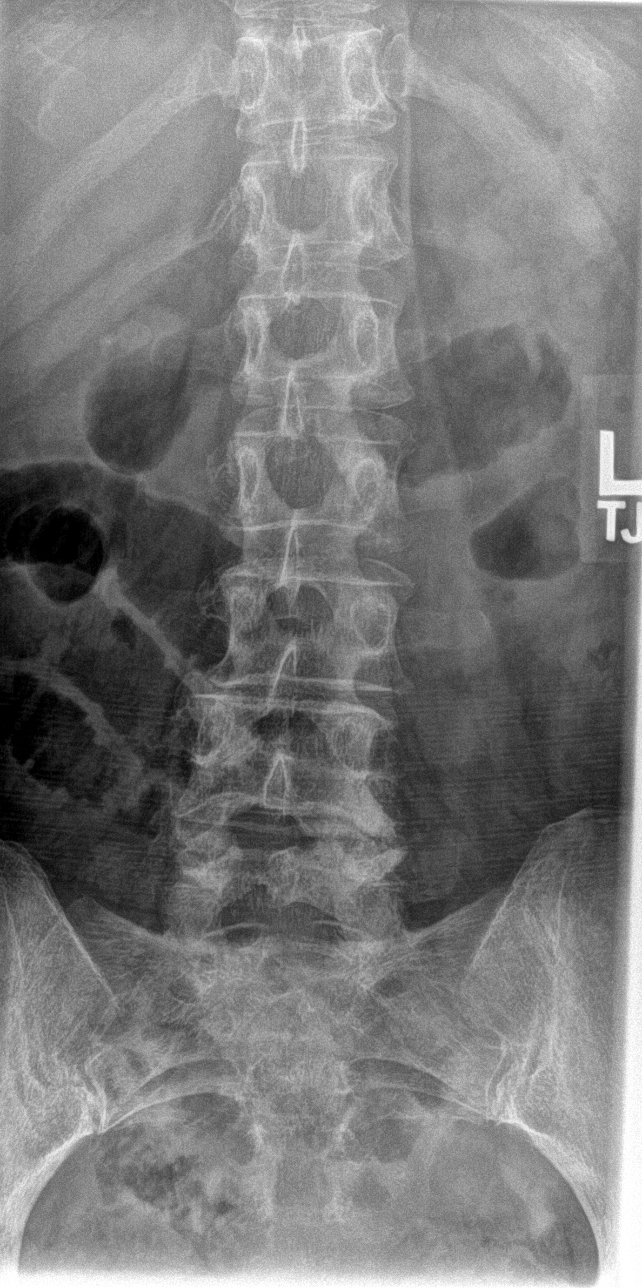

[l-spine obl (1 of 2)]
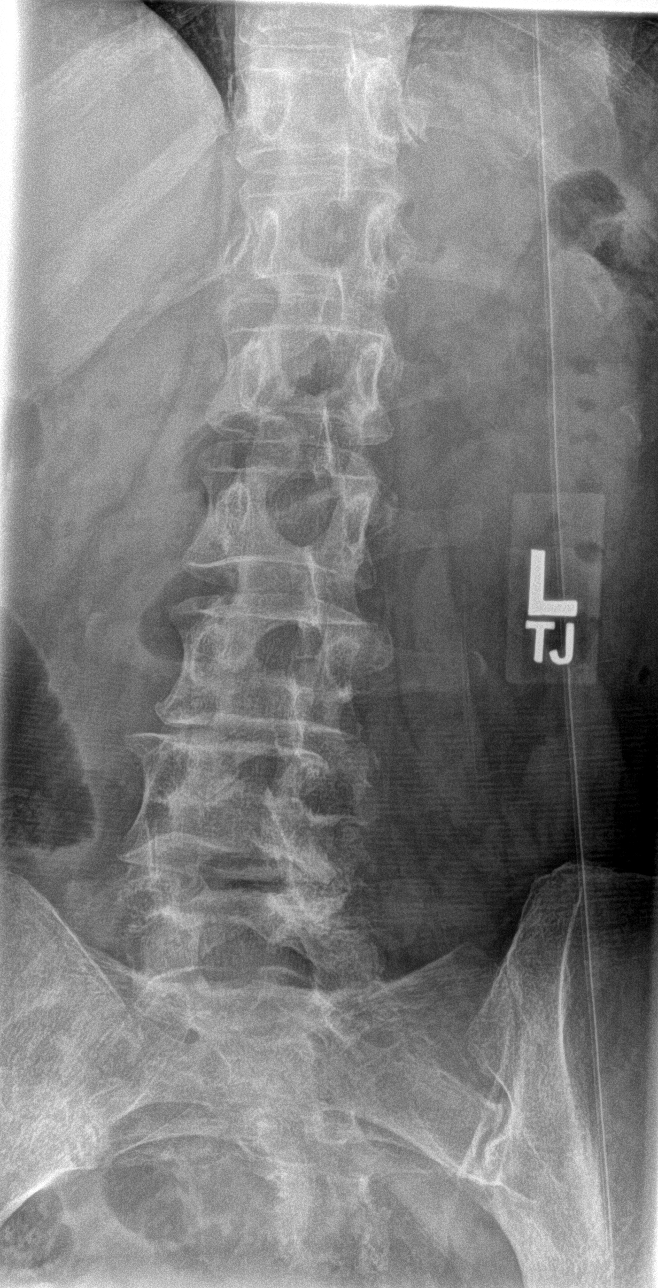

[l-spine obl (2 of 2)]
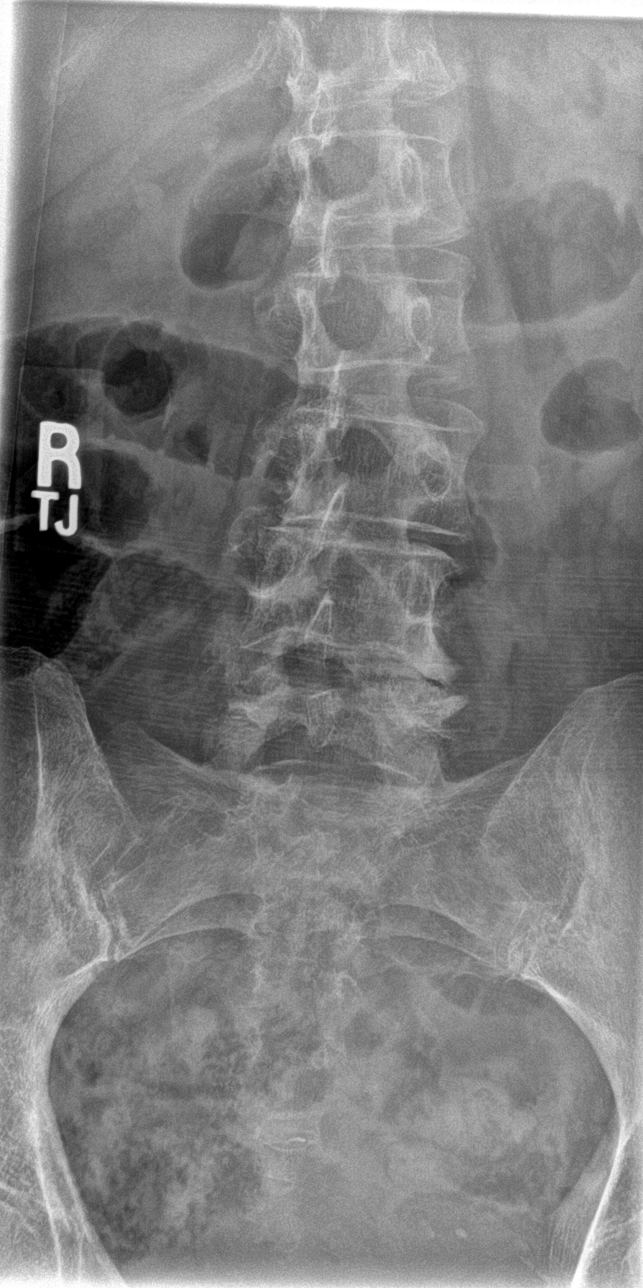

[l-spine lat]
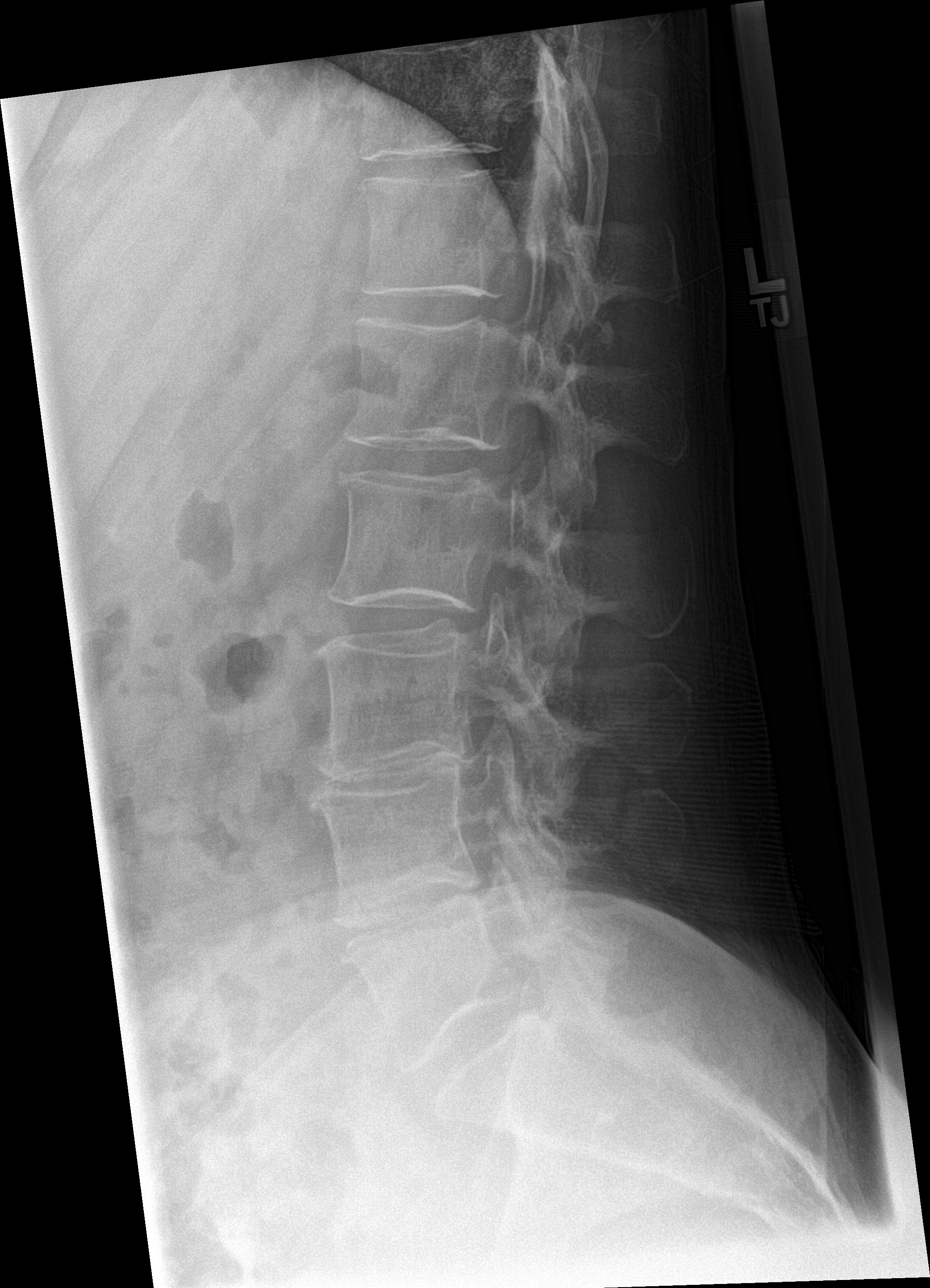

[l-spine l5/s1]
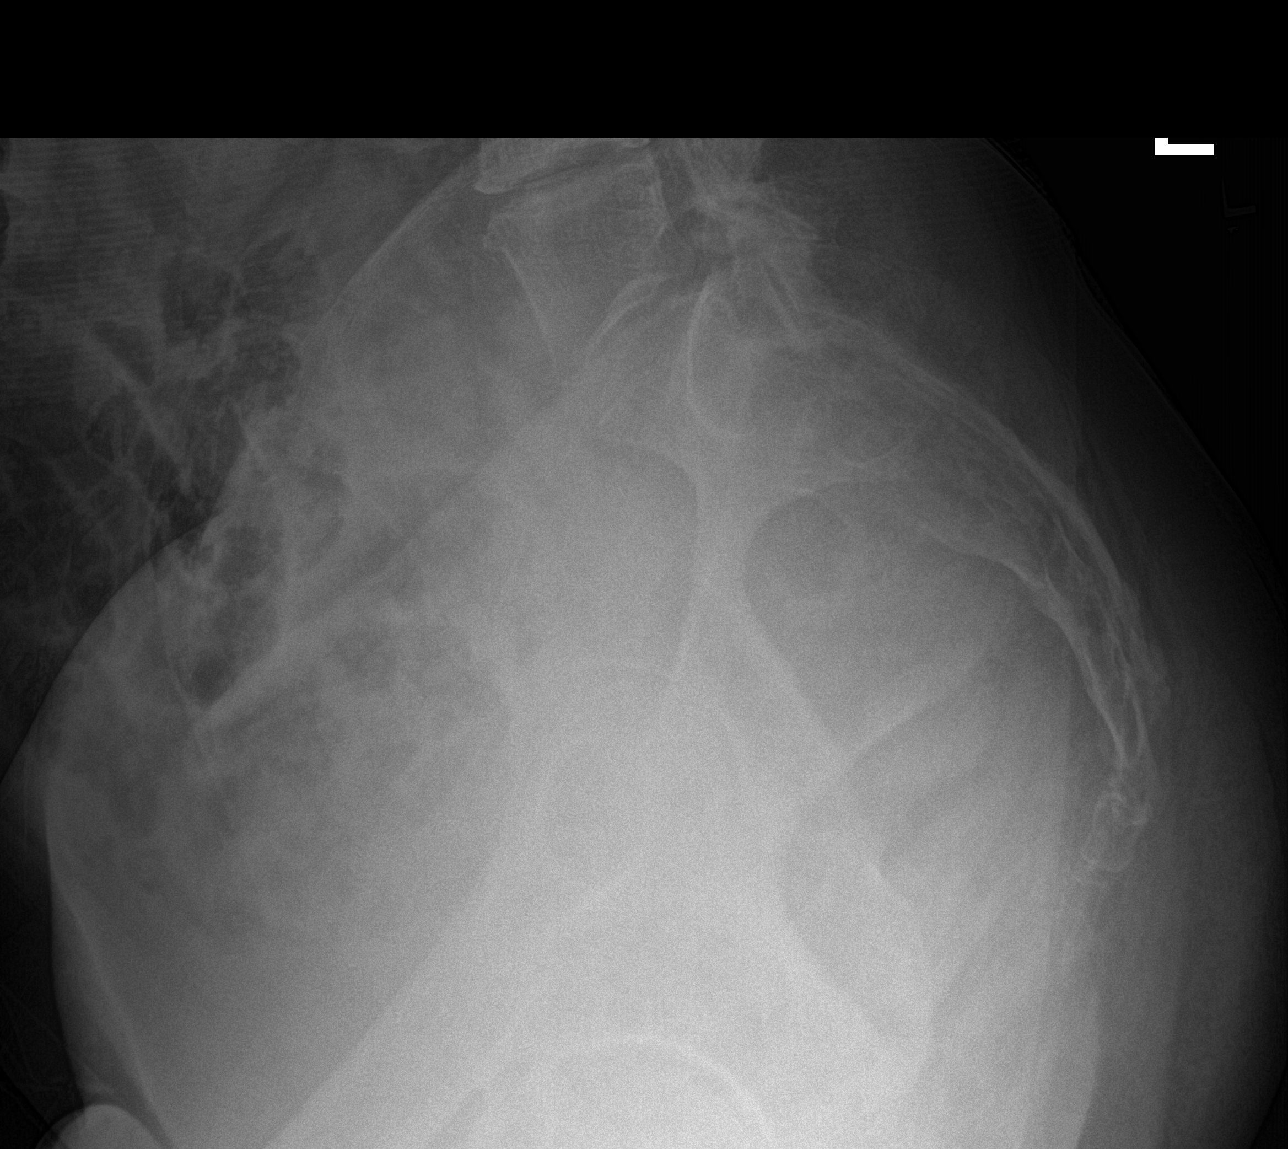

[5 of 5 positions shown; findings below may reference images not displayed]

FINDINGS: There is no acute displaced fracture. There is no dislocation. There
is facet arthrosis at the lower lumbar segments. Mild multilevel
disc height loss is noted throughout the lumbar spine.
IMPRESSION: 1. No acute displaced fracture or dislocation.
2. Mild multilevel degenerative disc disease and facet arthrosis.

## 2019-08-04 MED ORDER — ENALAPRIL MALEATE 20 MG PO TABS: 20.0000 mg | ORAL_TABLET | Freq: Two times a day (BID) | ORAL | 3 refills | Status: DC

## 2019-08-04 MED ORDER — METFORMIN HCL ER 500 MG PO TB24: ORAL_TABLET | ORAL | 3 refills | Status: DC

## 2019-08-04 MED ORDER — BD PEN NEEDLE MICRO U/F 32G X 6 MM MISC: 3 refills | Status: DC

## 2019-08-04 MED ORDER — AMLODIPINE BESYLATE 5 MG PO TABS: 5.0000 mg | ORAL_TABLET | Freq: Every day | ORAL | 3 refills | Status: DC

## 2019-08-04 MED ORDER — GLIPIZIDE 5 MG PO TABS: 5.0000 mg | ORAL_TABLET | Freq: Two times a day (BID) | ORAL | 3 refills | Status: DC

## 2019-08-04 MED ORDER — LANTUS SOLOSTAR 100 UNIT/ML ~~LOC~~ SOPN: 20.0000 [IU] | PEN_INJECTOR | Freq: Every day | SUBCUTANEOUS | 4 refills | Status: DC

## 2019-08-04 NOTE — Patient Instructions (Addendum)
Call to schedule repeat colonoscopy.  We should schedule mammogram and bone density scan on same day for next year. Check with insurance about coverage/cost for breast MRI instead of mammogram. Check with [facility] about available options.  If interested, check with pharmacy about new 2 shot shingles series (shingrix).  Medicines refilled today.  Return in 25months for diabetes follow up, but schedule lab visit in 3 months for A1c check.   Health Maintenance After Age 82 After age 25, you are at a higher risk for certain long-term diseases and infections as well as injuries from falls. Falls are a major cause of broken bones and head injuries in people who are older than age 7. Getting regular preventive care can help to keep you healthy and well. Preventive care includes getting regular testing and making lifestyle changes as recommended by your health care provider. Talk with your health care provider about:  Which screenings and tests you should have. A screening is a test that checks for a disease when you have no symptoms.  A diet and exercise plan that is right for you. What should I know about screenings and tests to prevent falls? Screening and testing are the best ways to find a health problem early. Early diagnosis and treatment give you the best chance of managing medical conditions that are common after age 82. Certain conditions and lifestyle choices may make you more likely to have a fall. Your health care provider may recommend:  Regular vision checks. Poor vision and conditions such as cataracts can make you more likely to have a fall. If you wear glasses, make sure to get your prescription updated if your vision changes.  Medicine review. Work with your health care provider to regularly review all of the medicines you are taking, including over-the-counter medicines. Ask your health care provider about any side effects that may make you more likely to have a fall. Tell  your health care provider if any medicines that you take make you feel dizzy or sleepy.  Osteoporosis screening. Osteoporosis is a condition that causes the bones to get weaker. This can make the bones weak and cause them to break more easily.  Blood pressure screening. Blood pressure changes and medicines to control blood pressure can make you feel dizzy.  Strength and balance checks. Your health care provider may recommend certain tests to check your strength and balance while standing, walking, or changing positions.  Foot health exam. Foot pain and numbness, as well as not wearing proper footwear, can make you more likely to have a fall.  Depression screening. You may be more likely to have a fall if you have a fear of falling, feel emotionally low, or feel unable to do activities that you used to do.  Alcohol use screening. Using too much alcohol can affect your balance and may make you more likely to have a fall. What actions can I take to lower my risk of falls? General instructions  Talk with your health care provider about your risks for falling. Tell your health care provider if: ? You fall. Be sure to tell your health care provider about all falls, even ones that seem minor. ? You feel dizzy, sleepy, or off-balance.  Take over-the-counter and prescription medicines only as told by your health care provider. These include any supplements.  Eat a healthy diet and maintain a healthy weight. A healthy diet includes low-fat dairy products, low-fat (lean) meats, and fiber from whole grains, beans, and lots  of fruits and vegetables. Home safety  Remove any tripping hazards, such as rugs, cords, and clutter.  Install safety equipment such as grab bars in bathrooms and safety rails on stairs.  Keep rooms and walkways well-lit. Activity   Follow a regular exercise program to stay fit. This will help you maintain your balance. Ask your health care provider what types of exercise are  appropriate for you.  If you need a cane or walker, use it as recommended by your health care provider.  Wear supportive shoes that have nonskid soles. Lifestyle  Do not drink alcohol if your health care provider tells you not to drink.  If you drink alcohol, limit how much you have: ? 0-1 drink a day for women. ? 0-2 drinks a day for men.  Be aware of how much alcohol is in your drink. In the [place] one drink equals one typical bottle of beer (12 oz), one-half glass of wine (5 oz), or one shot of hard liquor (1 oz).  Do not use any products that contain nicotine or tobacco, such as cigarettes and e-cigarettes. If you need help quitting, ask your health care provider. Summary  Having a healthy lifestyle and getting preventive care can help to protect your health and wellness after age 69.  Screening and testing are the best way to find a health problem early and help you avoid having a fall. Early diagnosis and treatment give you the best chance for managing medical conditions that are more common for people who are older than age 82.  Falls are a major cause of broken bones and head injuries in people who are older than age 73. Take precautions to prevent a fall at home.  Work with your health care provider to learn what changes you can make to improve your health and wellness and to prevent falls. This information is not intended to replace advice given to you by your health care provider. Make sure you discuss any questions you have with your health care provider. Document Revised: 05/19/2018 Document Reviewed: 12/09/2016 Elsevier Patient Education  2020 [employer].

## 2019-08-04 NOTE — Assessment & Plan Note (Signed)

## 2019-08-04 NOTE — Assessment & Plan Note (Signed)
Preventative protocols reviewed and updated unless pt declined. Discussed healthy diet and lifestyle.  

## 2019-08-04 NOTE — Progress Notes (Addendum)
This visit was conducted in person.  BP 138/86 (BP Location: Left Arm, Patient Position: Sitting, Cuff Size: Normal)   Pulse 70   Temp 97.8 F (36.6 C) (Temporal)   Ht 5\' 9"  (1.753 m)   Wt 165 lb 8 oz (75.1 kg)   SpO2 97%   BMI 24.44 kg/m    CC: CPE/AMW Subjective:    Patient ID: Elizabeth Curtis, female    DOB: October 14, 1950, 69 y.o.   MRN: [id number]  HPI: Elizabeth Curtis is a 69 y.o. female presenting on 08/04/2019 for Annual Exam (Prt 2. [will have wellnes on 08/17/19])   Planning to speak with health advisor next week - but instead will complete AMW today.    Hearing Screening   125Hz  250Hz  500Hz  1000Hz  2000Hz  3000Hz  4000Hz  6000Hz  8000Hz   Right ear:   40 40 20  20    Left ear:   40 40 40  40    Vision Screening Comments: Last eye exam, 09/2018.    Office Visit from 08/04/2019 in [place] at [facility] Total Score 0    Saw ENT for asymmetric left hearing loss - s/p reassuring MRI.  Fall Risk  08/04/2019 07/15/2018 06/30/2017 04/29/2016 03/07/2015  Falls in the past year? 0 0 No Yes Yes  Number falls in past yr: - - - 1 1  Injury with Fall? - - - No Yes  Risk for fall due to : - - - - History of fall(s)  Follow up - - - - Education provided;Falls prevention discussed  Comment - - - - mechanical fall    Brain MRI did show cervical DDD.  Chronic lower back pain wonders of DDD present as well. Trouble wearing bra due to midline lower thoracic back pain. This seems to be worsening. Also with some chronic lower back pain. Notes worsening night time leg cramps at feet into calves. She started taking OTC magnesium 25mg  nightly without significant benefit.   Preventative: Colonoscopy - 2010 [place], 1 polyp, 8mm lipoma, o/w normal, rec rpt 10 yrs. Has received recall letter. Will call to schedule.  Well woman at [facility],Dr Ward 04/2019. Pap normal per patient. LMP - 2010.Menopausal.  Mammogram - 05/2019 - s/p benign biopsy complicated by hematoma (fibrocystic  changes with apocrine metaplasia. She asks about MRI breast for future screenings.  DEXA2/15/17 Osteopenia-not regular withcalciumorvit D, hasn't beenwalking regularly.  Flu shot yearly. Tetanus 2000, 2010.  Pneumovax 09/2011 (felt sick afterwards). Prevnar 02/2015. Due for final pneumovax - declines today.  [place] 03/2019 Shingrix- discussed,interested  Advanced directives: living will scanned into chart2017. Husband Chrissie Noa then son Gerald Stabs are [place]. Does not want prolonged life support if terminally or incurably ill. Seat belt use discussed  Sunscreen use discussed, no changing skin lesions. H/o melanoma 2012, sees Dr Evorn Gong dermyearly  Non smoker Alcohol - rare Dentist Q6 months  Eye doctor yearly Bowel - no constipation Bladder - some stress incontinence - sometimes wears pad.   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  Activity:2.5 mi most days walking Diet: good water, daily fruits/vegetables     Relevant past medical, surgical, family and social history reviewed and updated as indicated. Interim medical history since our last visit reviewed. Allergies and medications reviewed and updated. Outpatient Medications Prior to Visit  Medication Sig Dispense Refill  . Calcium Carbonate-Vitamin D (CALCIUM-VITAMIN D3 PO) Take by mouth. 1200 mg of  calcium; 1000 units of Vit D3    . Cholecalciferol (VITAMIN D3 PO) Take 5,000 Units by mouth daily.    . colesevelam (WELCHOL) 625 MG tablet Take 1 tablet (625 mg total) by mouth every Monday, Wednesday, and Friday. 30 tablet 3  . glucose blood (FREESTYLE LITE) test strip USE TO CHECK SUGAR ONCE DAILY AND AS NEEDED. DX:E11.65 100 each 3  . MISC NATURAL PRODUCTS PO Take by mouth. CBD supplement 0.5 mg by mouth daily    . amLODipine (NORVASC) 5 MG tablet Take 1 tablet (5 mg total) by mouth at bedtime. 90 tablet 3  .  enalapril (VASOTEC) 20 MG tablet Take 1 tablet (20 mg total) by mouth 2 (two) times daily. 180 tablet 3  . glipiZIDE (GLUCOTROL) 5 MG tablet Take 1 tablet (5 mg total) by mouth 2 (two) times daily before a meal. 180 tablet 3  . insulin glargine (LANTUS SOLOSTAR) 100 UNIT/ML Solostar Pen Inject 20 Units into the skin daily. 15 mL 4  . Insulin Pen Needle (BD PEN NEEDLE MICRO U/F) 32G X 6 MM MISC Use as directed once daliy 100 each 3  . metFORMIN (GLUCOPHAGE-XR) 500 MG 24 hr tablet TAKE 1 (ONE) TABLET IN AM AND 2 (TWO) TABLETS IN PM WITH MEALS 270 tablet 1  . fluticasone (FLONASE) 50 MCG/ACT nasal spray Place 2 sprays into both nostrils daily. (Patient not taking: Reported on 03/27/2019) 16 g 6  . Multiple Vitamins-Minerals (CENTRUM SILVER PO) Take 1 tablet by mouth daily.    . Turmeric ([employer] TURMERIC CURCUMIN) 500 MG CAPS Take 1 capsule by mouth daily.    Marland Kitchen VITAMIN K PO Take 100 mcg by mouth daily.     No facility-administered medications prior to visit.     Per HPI unless specifically indicated in ROS section below Review of Systems  Constitutional: Negative for activity change, appetite change, chills, fatigue, fever and unexpected weight change.  HENT: Negative for hearing loss.   Eyes: Negative for visual disturbance.  Respiratory: Negative for cough, chest tightness, shortness of breath and wheezing.   Cardiovascular: Positive for palpitations. Negative for chest pain and leg swelling.  Gastrointestinal: Negative for abdominal distention, abdominal pain, blood in stool, constipation, diarrhea, nausea and vomiting.  Genitourinary: Negative for difficulty urinating and hematuria.  Musculoskeletal: Negative for arthralgias, myalgias and neck pain.  Skin: Negative for rash.  Neurological: Negative for dizziness, seizures, syncope and headaches.  Hematological: Negative for adenopathy. Bruises/bleeds easily (she stopped turmeric).  Psychiatric/Behavioral: Negative for dysphoric mood. The  patient is not nervous/anxious.    Objective:  BP 138/86 (BP Location: Left Arm, Patient Position: Sitting, Cuff Size: Normal)   Pulse 70   Temp 97.8 F (36.6 C) (Temporal)   Ht 5\' 9"  (1.753 m)   Wt 165 lb 8 oz (75.1 kg)   SpO2 97%   BMI 24.44 kg/m   Wt Readings from Last 3 Encounters:  08/04/19 165 lb 8 oz (75.1 kg)  04/18/19 164 lb 6 oz (74.6 kg)  02/13/19 163 lb 8 oz (74.2 kg)      Physical Exam Vitals and nursing note reviewed.  Constitutional:      General: She is not in acute distress.    Appearance: Normal appearance. She is well-developed. She is not ill-appearing.  HENT:     Head: Normocephalic and atraumatic.     Right Ear: Hearing, tympanic membrane, ear canal and external ear normal.     Left Ear: Hearing, tympanic membrane, ear canal and external ear  normal.     Mouth/Throat:     Pharynx: Uvula midline.  Eyes:     General: No scleral icterus.    Extraocular Movements: Extraocular movements intact.     Conjunctiva/sclera: Conjunctivae normal.     Pupils: Pupils are equal, round, and reactive to light.  Cardiovascular:     Rate and Rhythm: Normal rate and regular rhythm.     Pulses: Normal pulses.          Radial pulses are 2+ on the right side and 2+ on the left side.     Heart sounds: Normal heart sounds. No murmur heard.   Pulmonary:     Effort: Pulmonary effort is normal. No respiratory distress.     Breath sounds: Normal breath sounds. No wheezing, rhonchi or rales.  Abdominal:     General: Abdomen is flat. Bowel sounds are normal. There is no distension.     Palpations: Abdomen is soft. There is no mass.     Tenderness: There is no abdominal tenderness. There is no guarding or rebound.     Hernia: No hernia is present.  Musculoskeletal:        General: Normal range of motion.     Cervical back: Normal range of motion and neck supple.     Right lower leg: No edema.     Left lower leg: No edema.  Lymphadenopathy:     Cervical: No cervical  adenopathy.  Skin:    General: Skin is warm and dry.     Findings: No rash.  Neurological:     General: No focal deficit present.     Mental Status: She is alert and oriented to person, place, and time.     Comments:  CN grossly intact, station and gait intact Recall 3/3 Calculation 5/5 DLROW  Psychiatric:        Mood and Affect: Mood normal.        Behavior: Behavior normal.        Thought Content: Thought content normal.        Judgment: Judgment normal.       Results for orders placed or performed in visit on 07/28/19  Hemoglobin A1c  Result Value Ref Range   Hgb A1c MFr Bld 7.3 (H) 4.6 - 6.5 %  Comprehensive metabolic panel  Result Value Ref Range   Sodium 139 135 - 145 mEq/L   Potassium 4.1 3.5 - 5.1 mEq/L   Chloride 104 96 - 112 mEq/L   CO2 31 19 - 32 mEq/L   Glucose, Bld 119 (H) 70 - 99 mg/dL   BUN 20 6 - 23 mg/dL   Creatinine, Ser 0.72 0.40 - 1.20 mg/dL   Total Bilirubin 0.6 0.2 - 1.2 mg/dL   Alkaline Phosphatase 51 39 - 117 U/L   AST 12 0 - 37 U/L   ALT 14 0 - 35 U/L   Total Protein 6.7 6.0 - 8.3 g/dL   Albumin 4.5 3.5 - 5.2 g/dL   GFR 80.19 >60.00 mL/min   Calcium 9.8 8.4 - 10.5 mg/dL  TSH  Result Value Ref Range   TSH 0.58 0.35 - 4.50 uIU/mL  T4, free  Result Value Ref Range   Free T4 1.05 0.60 - 1.60 ng/dL  Lipid panel  Result Value Ref Range   Cholesterol 183 0 - 200 mg/dL   Triglycerides 130.0 0 - 149 mg/dL   HDL 46.80 >39.00 mg/dL   VLDL 26.0 0.0 - 40.0 mg/dL   LDL Cholesterol 111 (  H) 0 - 99 mg/dL   Total CHOL/HDL Ratio 4    NonHDL 136.58    Assessment & Plan:  This visit occurred during the SARS-CoV-2 public health emergency.  Safety protocols were in place, including screening questions prior to the visit, additional usage of staff PPE, and extensive cleaning of exam room while observing appropriate contact time as indicated for disinfecting solutions.   Problem List Items Addressed This Visit    Uveitis-hyphema-glaucoma syndrome of left  eye   Stress incontinence    Discussed Kegels. To touch base with GYN if desires further treatment.       Statin intolerance    Intolerance to all statins tried, even RYR.       Osteopenia    Continue calcium, vit D and regular weight bearing exercise.  Update DEXA next year       Medicare annual wellness visit, subsequent - Primary    I have personally reviewed the Medicare Annual Wellness questionnaire and have noted 1. The patient's medical and social history 2. Their use of alcohol, tobacco or illicit drugs 3. Their current medications and supplements 4. The patient's functional ability including ADL's, fall risks, home safety risks and hearing or visual impairment. Cognitive function has been assessed and addressed as indicated.  5. Diet and physical activity 6. Evidence for depression or mood disorders The patients weight, height, BMI have been recorded in the chart. I have made referrals, counseling and provided education to the patient based on review of the above and I have provided the pt with a written personalized care plan for preventive services. Provider list updated.. See scanned questionairre as needed for further documentation. Reviewed preventative protocols and updated unless pt declined.       Leg cramps    Has started magnesium supplementation. Discussed trial of CoQ10, good hydration status, and stretching at night time.       Hyperlipidemia associated with type 2 diabetes mellitus ([place])    LDL above goal on MWF welchol - discussed increasing dose, she will work towards low chol diet and reassess at f/u visit.       Relevant Medications   glipiZIDE (GLUCOTROL) 5 MG tablet   metFORMIN (GLUCOPHAGE-XR) 500 MG 24 hr tablet   insulin glargine (LANTUS SOLOSTAR) 100 UNIT/ML Solostar Pen   enalapril (VASOTEC) 20 MG tablet   History of malignant melanoma    Continues yearly derm skin check.       Healthcare maintenance    Preventative protocols reviewed  and updated unless pt declined. Discussed healthy diet and lifestyle.       Essential hypertension, benign    Chronic, stable continue current regimen.       Relevant Medications   amLODipine (NORVASC) 5 MG tablet   enalapril (VASOTEC) 20 MG tablet   Diffuse cystic mastopathy    Recent biopsy benign findings (fibrocystic changes with apocrine metaplasia) complicated by hematoma. She asks about MRI breast for future screenings. This is second benign biopsy she has underwent (first 2013). She would likely not qualify for breast MRI for high risk status. I asked her to speak with insurance to see on coverage for breast MRI for cancer screening, as well as to discuss with [facility] to see what options are available to her.  Lifetime breast cancer risk = 10%.       Diabetes mellitus type 2, uncontrolled ([place])    Chronic, above goal. Encouraged ongoing efforts towards diabetic diet, with recheck A1c in 3 months (  lab visit only).       Relevant Medications   glipiZIDE (GLUCOTROL) 5 MG tablet   metFORMIN (GLUCOPHAGE-XR) 500 MG 24 hr tablet   insulin glargine (LANTUS SOLOSTAR) 100 UNIT/ML Solostar Pen   enalapril (VASOTEC) 20 MG tablet   Other Relevant Orders   POCT glycosylated hemoglobin (Hb A1C)   Chronic low back pain    Chronic for years, anticipate DDD related - will update lumbar imaging.       Relevant Orders   DG Lumbar Spine Complete (Completed)   Asymmetrical hearing loss of left ear   Adverse reaction to statin medication    Arthralgias to all statins tried, even RYR OTC.       Abnormal thyroid function test    TFTs normal.           Meds ordered this encounter  Medications  . amLODipine (NORVASC) 5 MG tablet    Sig: Take 1 tablet (5 mg total) by mouth at bedtime.    Dispense:  90 tablet    Refill:  3  . glipiZIDE (GLUCOTROL) 5 MG tablet    Sig: Take 1 tablet (5 mg total) by mouth 2 (two) times daily before a meal.    Dispense:  180 tablet     Refill:  3  . metFORMIN (GLUCOPHAGE-XR) 500 MG 24 hr tablet    Sig: TAKE 1 (ONE) TABLET IN AM AND 2 (TWO) TABLETS IN PM WITH MEALS    Dispense:  270 tablet    Refill:  3  . insulin glargine (LANTUS SOLOSTAR) 100 UNIT/ML Solostar Pen    Sig: Inject 20 Units into the skin daily.    Dispense:  15 mL    Refill:  4  . Insulin Pen Needle (BD PEN NEEDLE MICRO U/F) 32G X 6 MM MISC    Sig: Use as directed once daliy    Dispense:  100 each    Refill:  3  . enalapril (VASOTEC) 20 MG tablet    Sig: Take 1 tablet (20 mg total) by mouth 2 (two) times daily.    Dispense:  180 tablet    Refill:  3   Orders Placed This Encounter  Procedures  . DG Lumbar Spine Complete    Standing Status:   Future    Number of Occurrences:   1    Standing Expiration Date:   08/03/2020    Order Specific Question:   Reason for Exam (SYMPTOM  OR DIAGNOSIS REQUIRED)    Answer:   chronic lower back pain    Order Specific Question:   Preferred imaging location?    Answer:   [facility]    Order Specific Question:   Radiology Contrast Protocol - do NOT remove file path    Answer:   [email or web address]  . POCT glycosylated hemoglobin (Hb A1C)    Standing Status:   Future    Standing Expiration Date:   02/04/2020    Patient instructions: Call to schedule repeat colonoscopy.  We should schedule mammogram and bone density scan on same day for next year. Check with insurance about coverage/cost for breast MRI instead of mammogram. Check with [facility] about available options.  If interested, check with pharmacy about new 2 shot shingles series (shingrix).  Medicines refilled today.  Return in 6 months for diabetes follow up, but schedule lab visit in 3 months for A1c check.   Follow up plan: Return in about 6 months (around 02/03/2020) for follow up visit.  Ria Bush, MD

## 2019-08-05 DIAGNOSIS — R252 Cramp and spasm: Secondary | ICD-10-CM | POA: Insufficient documentation

## 2019-08-05 DIAGNOSIS — Z789 Other specified health status: Secondary | ICD-10-CM | POA: Insufficient documentation

## 2019-08-05 DIAGNOSIS — N393 Stress incontinence (female) (male): Secondary | ICD-10-CM | POA: Insufficient documentation

## 2019-08-05 DIAGNOSIS — T466X5A Adverse effect of antihyperlipidemic and antiarteriosclerotic drugs, initial encounter: Secondary | ICD-10-CM | POA: Insufficient documentation

## 2019-08-05 DIAGNOSIS — G720 Drug-induced myopathy: Secondary | ICD-10-CM | POA: Insufficient documentation

## 2019-08-05 NOTE — Assessment & Plan Note (Signed)
Intolerance to all statins tried, even RYR.

## 2019-08-05 NOTE — Assessment & Plan Note (Signed)
Chronic for years, anticipate DDD related - will update lumbar imaging.

## 2019-08-05 NOTE — Assessment & Plan Note (Signed)
Chronic, above goal. Encouraged ongoing efforts towards diabetic diet, with recheck A1c in 3 months (lab visit only).

## 2019-08-05 NOTE — Assessment & Plan Note (Signed)
Chronic, stable continue current regimen.  

## 2019-08-05 NOTE — Assessment & Plan Note (Signed)
Has started magnesium supplementation. Discussed trial of CoQ10, good hydration status, and stretching at night time.

## 2019-08-05 NOTE — Assessment & Plan Note (Signed)
Continues yearly derm skin check.

## 2019-08-05 NOTE — Assessment & Plan Note (Signed)
Discussed Kegels. To touch base with GYN if desires further treatment.

## 2019-08-05 NOTE — Assessment & Plan Note (Signed)
TFTs normal.

## 2019-08-05 NOTE — Assessment & Plan Note (Signed)
Recent biopsy benign findings (fibrocystic changes with apocrine metaplasia) complicated by hematoma. She asks about MRI breast for future screenings. This is second benign biopsy she has underwent (first 2013). She would likely not qualify for breast MRI for high risk status. I asked her to speak with insurance to see on coverage for breast MRI for cancer screening, as well as to discuss with [facility] to see what options are available to her.  Lifetime breast cancer risk = 10%.

## 2019-08-05 NOTE — Assessment & Plan Note (Signed)
Continue calcium, vit D and regular weight bearing exercise.  Update DEXA next year

## 2019-08-05 NOTE — Assessment & Plan Note (Signed)
LDL above goal on MWF welchol - discussed increasing dose, she will work towards low chol diet and reassess at f/u visit.

## 2019-08-06 ENCOUNTER — Encounter: Payer: Self-pay | Admitting: Family Medicine

## 2019-08-07 NOTE — Telephone Encounter (Signed)
Please cancel upcoming medicare wellness visit by [place].

## 2019-08-07 NOTE — Telephone Encounter (Signed)
C/x appt.

## 2019-08-17 ENCOUNTER — Ambulatory Visit: Payer: PPO

## 2019-08-28 ENCOUNTER — Ambulatory Visit: Payer: PPO

## 2019-08-30 ENCOUNTER — Other Ambulatory Visit: Payer: PPO

## 2019-09-05 ENCOUNTER — Encounter: Payer: PPO | Admitting: Family Medicine

## 2019-10-11 DIAGNOSIS — E119 Type 2 diabetes mellitus without complications: Secondary | ICD-10-CM | POA: Diagnosis not present

## 2019-10-11 LAB — HM DIABETES EYE EXAM

## 2019-10-20 ENCOUNTER — Encounter: Payer: Self-pay | Admitting: Family Medicine

## 2019-10-31 DIAGNOSIS — T466X5A Adverse effect of antihyperlipidemic and antiarteriosclerotic drugs, initial encounter: Secondary | ICD-10-CM | POA: Insufficient documentation

## 2019-10-31 NOTE — Assessment & Plan Note (Signed)
Arthralgias to all statins tried, even RYR OTC.

## 2019-11-06 ENCOUNTER — Other Ambulatory Visit: Payer: PPO

## 2019-11-15 ENCOUNTER — Encounter: Payer: Self-pay | Admitting: Gastroenterology

## 2019-11-16 DIAGNOSIS — H26492 Other secondary cataract, left eye: Secondary | ICD-10-CM | POA: Diagnosis not present

## 2019-11-21 ENCOUNTER — Encounter: Payer: Self-pay | Admitting: Family Medicine

## 2019-12-07 ENCOUNTER — Encounter: Payer: Self-pay | Admitting: Family Medicine

## 2019-12-07 NOTE — Telephone Encounter (Signed)
Updated pt's chart.  

## 2019-12-11 DIAGNOSIS — D2261 Melanocytic nevi of right upper limb, including shoulder: Secondary | ICD-10-CM | POA: Diagnosis not present

## 2019-12-11 DIAGNOSIS — Z85820 Personal history of malignant melanoma of skin: Secondary | ICD-10-CM | POA: Diagnosis not present

## 2019-12-11 DIAGNOSIS — L298 Other pruritus: Secondary | ICD-10-CM | POA: Diagnosis not present

## 2019-12-11 DIAGNOSIS — L538 Other specified erythematous conditions: Secondary | ICD-10-CM | POA: Diagnosis not present

## 2019-12-11 DIAGNOSIS — M71342 Other bursal cyst, left hand: Secondary | ICD-10-CM | POA: Diagnosis not present

## 2019-12-11 DIAGNOSIS — D2271 Melanocytic nevi of right lower limb, including hip: Secondary | ICD-10-CM | POA: Diagnosis not present

## 2019-12-11 DIAGNOSIS — L601 Onycholysis: Secondary | ICD-10-CM | POA: Diagnosis not present

## 2019-12-11 DIAGNOSIS — L820 Inflamed seborrheic keratosis: Secondary | ICD-10-CM | POA: Diagnosis not present

## 2019-12-11 DIAGNOSIS — D2262 Melanocytic nevi of left upper limb, including shoulder: Secondary | ICD-10-CM | POA: Diagnosis not present

## 2019-12-22 DIAGNOSIS — H903 Sensorineural hearing loss, bilateral: Secondary | ICD-10-CM | POA: Diagnosis not present

## 2020-01-03 ENCOUNTER — Ambulatory Visit (AMBULATORY_SURGERY_CENTER): Payer: PPO | Admitting: *Deleted

## 2020-01-03 ENCOUNTER — Encounter: Payer: Self-pay | Admitting: Gastroenterology

## 2020-01-03 ENCOUNTER — Other Ambulatory Visit: Payer: Self-pay

## 2020-01-03 VITALS — Ht 69.0 in | Wt 171.0 lb

## 2020-01-03 DIAGNOSIS — Z1211 Encounter for screening for malignant neoplasm of colon: Secondary | ICD-10-CM

## 2020-01-03 NOTE — Progress Notes (Signed)
Fully vax'd + booster  No egg or soy allergy known to patient  No issues with past sedation with any surgeries or procedures no intubation problems in the past  No FH of Malignant Hyperthermia No diet pills per patient No home 02 use per patient  No blood thinners per patient  Pt denies issues with constipation  No A fib or A flutter  EMMI video to pt or via [place] 19 guidelines implemented in PV today with Pt and RN   Due to the COVID-19 pandemic we are asking patients to follow these guidelines. Please only bring one care partner. Please be aware that your care partner may wait in the car in the parking lot or if they feel like they will be too hot to wait in the car, they may wait in the lobby on the 4th floor. All care partners are required to wear a mask the entire time (we do not have any that we can provide them), they need to practice social distancing, and we will do a Covid check for all patient's and care partners when you arrive. Also we will check their temperature and your temperature. If the care partner waits in their car they need to stay in the parking lot the entire time and we will call them on their cell phone when the patient is ready for discharge so they can bring the car to the front of the building. Also all patient's will need to wear a mask into building.

## 2020-01-18 ENCOUNTER — Encounter: Payer: PPO | Admitting: Gastroenterology

## 2020-01-22 ENCOUNTER — Ambulatory Visit: Payer: PPO | Admitting: Family Medicine

## 2020-01-26 DIAGNOSIS — H903 Sensorineural hearing loss, bilateral: Secondary | ICD-10-CM | POA: Diagnosis not present

## 2020-02-10 HISTORY — PX: COLONOSCOPY: SHX174

## 2020-02-13 DIAGNOSIS — L299 Pruritus, unspecified: Secondary | ICD-10-CM | POA: Diagnosis not present

## 2020-02-13 DIAGNOSIS — N644 Mastodynia: Secondary | ICD-10-CM | POA: Diagnosis not present

## 2020-03-08 ENCOUNTER — Ambulatory Visit (AMBULATORY_SURGERY_CENTER): Payer: PPO | Admitting: Gastroenterology

## 2020-03-08 ENCOUNTER — Encounter: Payer: Self-pay | Admitting: Gastroenterology

## 2020-03-08 ENCOUNTER — Other Ambulatory Visit: Payer: Self-pay

## 2020-03-08 VITALS — BP 157/81 | HR 74 | Temp 97.8°F | Resp 15 | Ht 69.0 in | Wt 171.0 lb

## 2020-03-08 DIAGNOSIS — D124 Benign neoplasm of descending colon: Secondary | ICD-10-CM | POA: Diagnosis not present

## 2020-03-08 DIAGNOSIS — E7800 Pure hypercholesterolemia, unspecified: Secondary | ICD-10-CM | POA: Diagnosis not present

## 2020-03-08 DIAGNOSIS — D128 Benign neoplasm of rectum: Secondary | ICD-10-CM | POA: Diagnosis not present

## 2020-03-08 DIAGNOSIS — E119 Type 2 diabetes mellitus without complications: Secondary | ICD-10-CM | POA: Diagnosis not present

## 2020-03-08 DIAGNOSIS — K621 Rectal polyp: Secondary | ICD-10-CM | POA: Diagnosis not present

## 2020-03-08 DIAGNOSIS — D123 Benign neoplasm of transverse colon: Secondary | ICD-10-CM | POA: Diagnosis not present

## 2020-03-08 DIAGNOSIS — Z86010 Personal history of colonic polyps: Secondary | ICD-10-CM | POA: Diagnosis not present

## 2020-03-08 DIAGNOSIS — Z1211 Encounter for screening for malignant neoplasm of colon: Secondary | ICD-10-CM | POA: Diagnosis not present

## 2020-03-08 MED ORDER — SODIUM CHLORIDE 0.9 % IV SOLN: 500.0000 mL | INTRAVENOUS | Status: DC

## 2020-03-08 NOTE — Op Note (Signed)
[place] Patient Name: Elizabeth Curtis Procedure Date: 03/08/2020 8:22 AM MRN: [id number] Endoscopist: Thornton Park MD, MD Age: 70 Referring MD:  Date of Birth: [birth year] Gender: Female Account #: [id number] Procedure:                Colonoscopy Indications:              Surveillance: Personal history of colonic polyps                            (unknown histology) on last colonoscopy more than 5                            years ago                           Colonoscopy at [facility] in 2008 with removal of a                            polyp, pathology not known                           Colonoscopy with Dr. Sharlett Iles 2010: hyperplastic                            polyps Medicines:                Monitored Anesthesia Care Procedure:                Pre-Anesthesia Assessment:                           - Prior to the procedure, a History and Physical                            was performed, and patient medications and                            allergies were reviewed. The patient's tolerance of                            previous anesthesia was also reviewed. The risks                            and benefits of the procedure and the sedation                            options and risks were discussed with the patient.                            All questions were answered, and informed consent                            was obtained. Prior Anticoagulants: The patient has                            taken no  previous anticoagulant or antiplatelet                            agents. ASA Grade Assessment: II - A patient with                            mild systemic disease. After reviewing the risks                            and benefits, the patient was deemed in                            satisfactory condition to undergo the procedure.                           After obtaining informed consent, the colonoscope                            was passed under direct vision.  Throughout the                            procedure, the patient's blood pressure, pulse, and                            oxygen saturations were monitored continuously. The                            Olympus CF-HQ190L ([id number]) Colonoscope was                            introduced through the anus and advanced to the 3                            cm into the ileum. The colonoscopy was performed                            with moderate difficulty due to a redundant colon                            and a tortuous colon. Successful completion of the                            procedure was aided by withdrawing and reinserting                            the scope and applying abdominal pressure. The                            patient tolerated the procedure well. The quality                            of the bowel preparation was good. The terminal  ileum, ileocecal valve, appendiceal orifice, and                            rectum were photographed. Scope In: 8:33:19 AM Scope Out: 8:53:39 AM Scope Withdrawal Time: 0 hours 15 minutes 22 seconds  Total Procedure Duration: 0 hours 20 minutes 20 seconds  Findings:                 Hemorrhoids were found on perianal exam.                           Non-bleeding internal hemorrhoids were found.                           A few small-mouthed diverticula were found in the                            sigmoid colon.                           Two sessile polyps were found in the rectum. The                            polyps were 2 to 6 mm in size. These polyps were                            removed with a cold snare. Resection and retrieval                            were complete. Estimated blood loss was minimal.                           A 3 mm polyp was found in the proximal descending                            colon. The polyp was flat. Polypectomy was                            attempted, initially using a cold snare. The  top of                            the polyp was removed, but the base was not                            ammenable to snare technique despite multiple                            attempts. Polyp resection was incomplete with this                            device. This intervention then required a different                            device and polypectomy technique. The polyp  was                            removed with a cold biopsy forceps. Resection and                            retrieval were complete.                           A 3 mm polyp was found in the proximal transverse                            colon. The polyp was sessile. The polyp was removed                            with a cold snare. Resection and retrieval were                            complete. Estimated blood loss was minimal.                           There was a large lipoma, in the mid transverse                            colon.                           The exam was otherwise without abnormality on                            direct and retroflexion views. Complications:            No immediate complications. Estimated blood loss:                            Minimal. Estimated Blood Loss:     Estimated blood loss was minimal. Impression:               - Hemorrhoids found on perianal exam.                           - Non-bleeding internal hemorrhoids.                           - Diverticulosis in the sigmoid colon.                           - Two 2 to 6 mm polyps in the rectum, removed with                            a cold snare. Resected and retrieved.                           - One 3 mm polyp in the proximal descending colon,  removed with a cold biopsy forceps. Resected and                            retrieved.                           - One 3 mm polyp in the proximal transverse colon,                            removed with a cold snare. Resected and retrieved.                            - Large lipoma in the mid transverse colon.                           - The examination was otherwise normal on direct                            and retroflexion views. Recommendation:           - Patient has a contact number available for                            emergencies. The signs and symptoms of potential                            delayed complications were discussed with the                            patient. Return to normal activities tomorrow.                            Written discharge instructions were provided to the                            patient.                           - Follow a high fiber diet. Drink at least 64                            ounces of water daily. Add a daily stool bulking                            agent such as psyllium (an exampled would be                            Metamucil).                           - Continue present medications.                           - Await pathology results.                           -  Repeat colonoscopy date to be determined after                            pending pathology results are reviewed for                            surveillance. Plan colonoscopy in 3 years if at                            least 3 polyps are adenomas.                           - Emerging evidence supports eating a diet of                            fruits, vegetables, grains, calcium, and yogurt                            while reducing red meat and alcohol may reduce the                            risk of colon cancer.                           - Thank you for allowing me to be involved in your                            colon cancer prevention. Thornton Park MD, MD 03/08/2020 9:02:34 AM This report has been signed electronically.

## 2020-03-08 NOTE — Progress Notes (Signed)
Vitals by CW 

## 2020-03-08 NOTE — Progress Notes (Signed)
Report given to PACU, vss 

## 2020-03-08 NOTE — Progress Notes (Signed)
0840 BP  183/108, Labetalol given IV, MD update, vss

## 2020-03-08 NOTE — Progress Notes (Signed)
Called to room to assist during endoscopic procedure.  Patient ID and intended procedure confirmed with present staff. Received instructions for my participation in the procedure from the performing physician.  

## 2020-03-08 NOTE — Patient Instructions (Signed)
Discharge instructions given. Handouts on polyps and Diverticulosis. Resume previous medications. YOU HAD AN ENDOSCOPIC PROCEDURE TODAY AT THE [facility]:   Refer to the procedure report that was given to you for any specific questions about what was found during the examination.  If the procedure report does not answer your questions, please call your gastroenterologist to clarify.  If you requested that your care partner not be given the details of your procedure findings, then the procedure report has been included in a sealed envelope for you to review at your convenience later.  YOU SHOULD EXPECT: Some feelings of bloating in the abdomen. Passage of more gas than usual.  Walking can help get rid of the air that was put into your GI tract during the procedure and reduce the bloating. If you had a lower endoscopy (such as a colonoscopy or flexible sigmoidoscopy) you may notice spotting of blood in your stool or on the toilet paper. If you underwent a bowel prep for your procedure, you may not have a normal bowel movement for a few days.  Please Note:  You might notice some irritation and congestion in your nose or some drainage.  This is from the oxygen used during your procedure.  There is no need for concern and it should clear up in a day or so.  SYMPTOMS TO REPORT IMMEDIATELY:   Following lower endoscopy (colonoscopy or flexible sigmoidoscopy):  Excessive amounts of blood in the stool  Significant tenderness or worsening of abdominal pains  Swelling of the abdomen that is new, acute  Fever of 100F or higher   For urgent or emergent issues, a gastroenterologist can be reached at any hour by calling [phone]. Do not use MyChart messaging for urgent concerns.    DIET:  We do recommend a small meal at first, but then you may proceed to your regular diet.  Drink plenty of fluids but you should avoid alcoholic beverages for 24 hours.  ACTIVITY:  You should plan to take  it easy for the rest of today and you should NOT DRIVE or use heavy machinery until tomorrow (because of the sedation medicines used during the test).    FOLLOW UP: Our staff will call the number listed on your records 48-72 hours following your procedure to check on you and address any questions or concerns that you may have regarding the information given to you following your procedure. If we do not reach you, we will leave a message.  We will attempt to reach you two times.  During this call, we will ask if you have developed any symptoms of COVID 19. If you develop any symptoms (ie: fever, flu-like symptoms, shortness of breath, cough etc.) before then, please call [phone].  If you test positive for Covid 19 in the 2 weeks post procedure, please call and report this information to us.    If any biopsies were taken you will be contacted by phone or by letter within the next 1-3 weeks.  Please call us at [phone] if you have not heard about the biopsies in 3 weeks.    SIGNATURES/CONFIDENTIALITY: You and/or your care partner have signed paperwork which will be entered into your electronic medical record.  These signatures attest to the fact that that the information above on your After Visit Summary has been reviewed and is understood.  Full responsibility of the confidentiality of this discharge information lies with you and/or your care-partner. 

## 2020-03-09 ENCOUNTER — Encounter: Payer: Self-pay | Admitting: Family Medicine

## 2020-03-12 ENCOUNTER — Telehealth: Payer: Self-pay

## 2020-03-12 ENCOUNTER — Telehealth: Payer: Self-pay | Admitting: *Deleted

## 2020-03-12 NOTE — Telephone Encounter (Signed)
First post procedure follow up call, no answer 

## 2020-03-12 NOTE — Telephone Encounter (Signed)
  Follow up Call-  Call back number 03/08/2020  Post procedure Call Back phone  # [phone]  Permission to leave phone message Yes  Some recent data might be hidden    Spoke with husband Patient questions:  Do you have a fever, pain , or abdominal swelling? No. Pain Score  0 *  Have you tolerated food without any problems? Yes.    Have you been able to return to your normal activities? Yes.    Do you have any questions about your discharge instructions: Diet   No. Medications  No. Follow up visit  No.  Do you have questions or concerns about your Care? No.  Actions: * If pain score is 4 or above: No action needed, pain <4.  1. Have you developed a fever since your procedure? no  2.   Have you had an respiratory symptoms (SOB or cough) since your procedure? no  3.   Have you tested positive for COVID 19 since your procedure o  4.   Have you had any family members/close contacts diagnosed with the COVID 19 since your procedure?  no   If yes to any of these questions please route to Joylene John, RN and Joella Prince, RN

## 2020-03-14 ENCOUNTER — Encounter: Payer: Self-pay | Admitting: Gastroenterology

## 2020-04-18 DIAGNOSIS — S99922A Unspecified injury of left foot, initial encounter: Secondary | ICD-10-CM | POA: Diagnosis not present

## 2020-04-18 DIAGNOSIS — B351 Tinea unguium: Secondary | ICD-10-CM | POA: Diagnosis not present

## 2020-04-18 DIAGNOSIS — L600 Ingrowing nail: Secondary | ICD-10-CM | POA: Diagnosis not present

## 2020-04-18 DIAGNOSIS — E119 Type 2 diabetes mellitus without complications: Secondary | ICD-10-CM | POA: Diagnosis not present

## 2020-05-14 ENCOUNTER — Other Ambulatory Visit: Payer: Self-pay | Admitting: Obstetrics & Gynecology

## 2020-05-14 DIAGNOSIS — Z01419 Encounter for gynecological examination (general) (routine) without abnormal findings: Secondary | ICD-10-CM | POA: Diagnosis not present

## 2020-05-14 DIAGNOSIS — Z1331 Encounter for screening for depression: Secondary | ICD-10-CM | POA: Diagnosis not present

## 2020-05-14 DIAGNOSIS — Z124 Encounter for screening for malignant neoplasm of cervix: Secondary | ICD-10-CM | POA: Diagnosis not present

## 2020-05-14 DIAGNOSIS — Z1239 Encounter for other screening for malignant neoplasm of breast: Secondary | ICD-10-CM | POA: Diagnosis not present

## 2020-05-14 DIAGNOSIS — Z1211 Encounter for screening for malignant neoplasm of colon: Secondary | ICD-10-CM | POA: Diagnosis not present

## 2020-05-14 DIAGNOSIS — Z1231 Encounter for screening mammogram for malignant neoplasm of breast: Secondary | ICD-10-CM

## 2020-05-30 ENCOUNTER — Other Ambulatory Visit: Payer: Self-pay

## 2020-05-30 ENCOUNTER — Ambulatory Visit
Admission: RE | Admit: 2020-05-30 | Discharge: 2020-05-30 | Disposition: A | Discharge status: Home / Self Care | Payer: PPO | Source: Ambulatory Visit | Attending: Obstetrics & Gynecology | Admitting: Obstetrics & Gynecology

## 2020-05-30 DIAGNOSIS — Z1231 Encounter for screening mammogram for malignant neoplasm of breast: Secondary | ICD-10-CM | POA: Diagnosis not present

## 2020-05-30 IMAGING — MG MM DIGITAL SCREENING BILAT W/ TOMO AND CAD
6 of 10 series · 6 of 30 positions shown · non-contrast
Comparison: Previous exam(s).

CLINICAL DATA: Screening.

EXAM:
DIGITAL SCREENING BILATERAL MAMMOGRAM WITH TOMOSYNTHESIS AND CAD
TECHNIQUE: Bilateral screening digital craniocaudal and mediolateral oblique
mammograms were obtained. Bilateral screening digital breast
tomosynthesis was performed. The images were evaluated with
computer-aided detection.

[R MLO synth-2D]
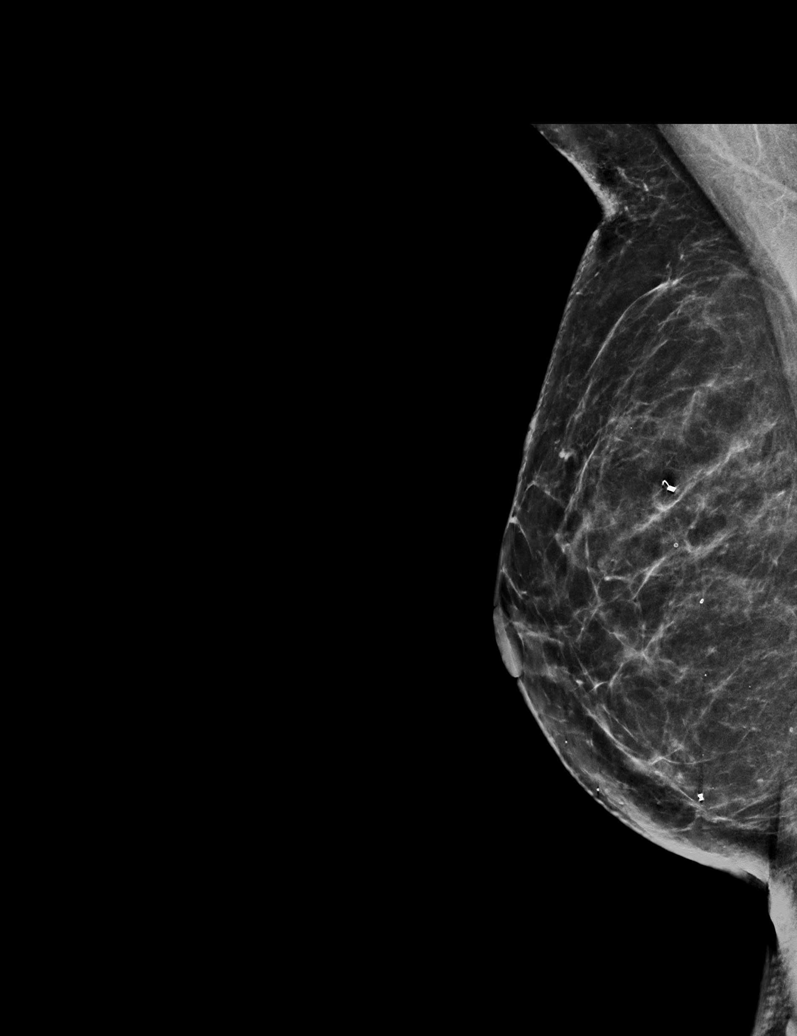

[L CC synth-2D]
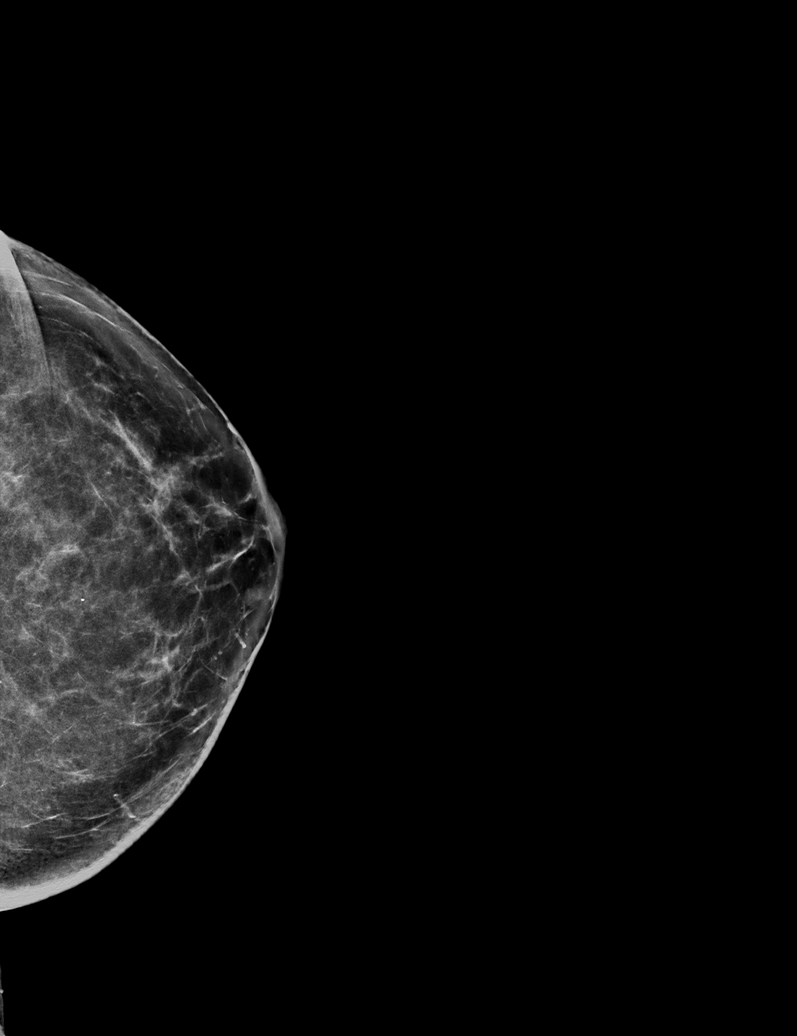

[L MLO synth-2D (1 of 2)]
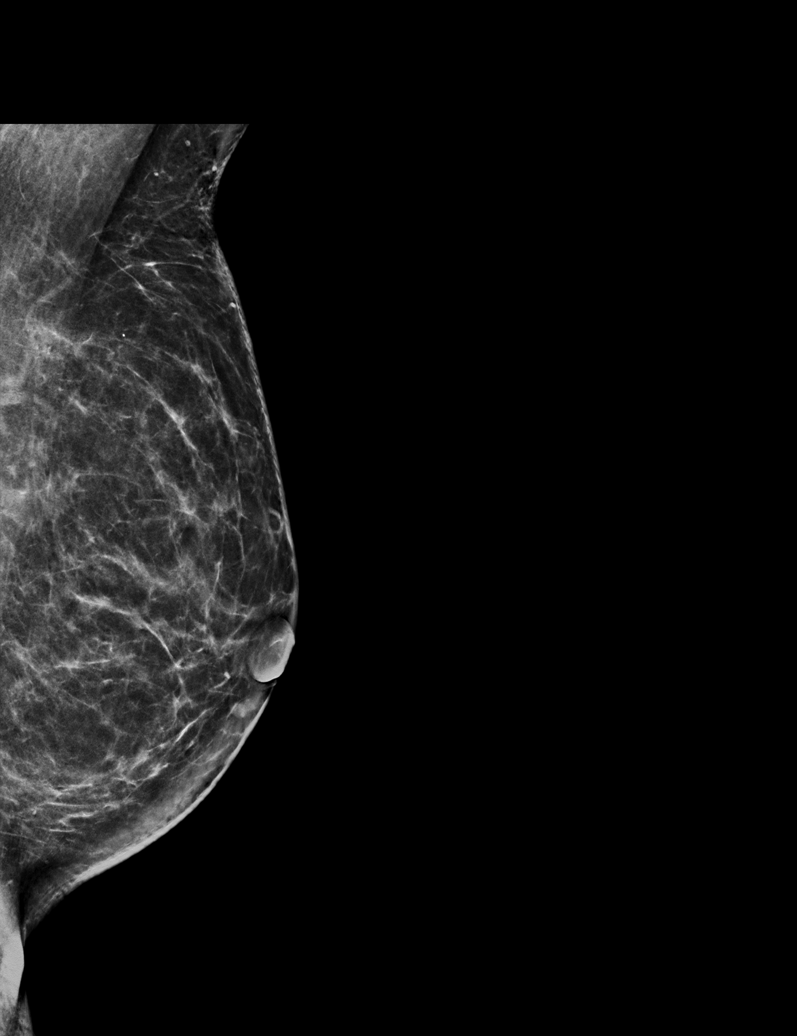

[R CC synth-2D]
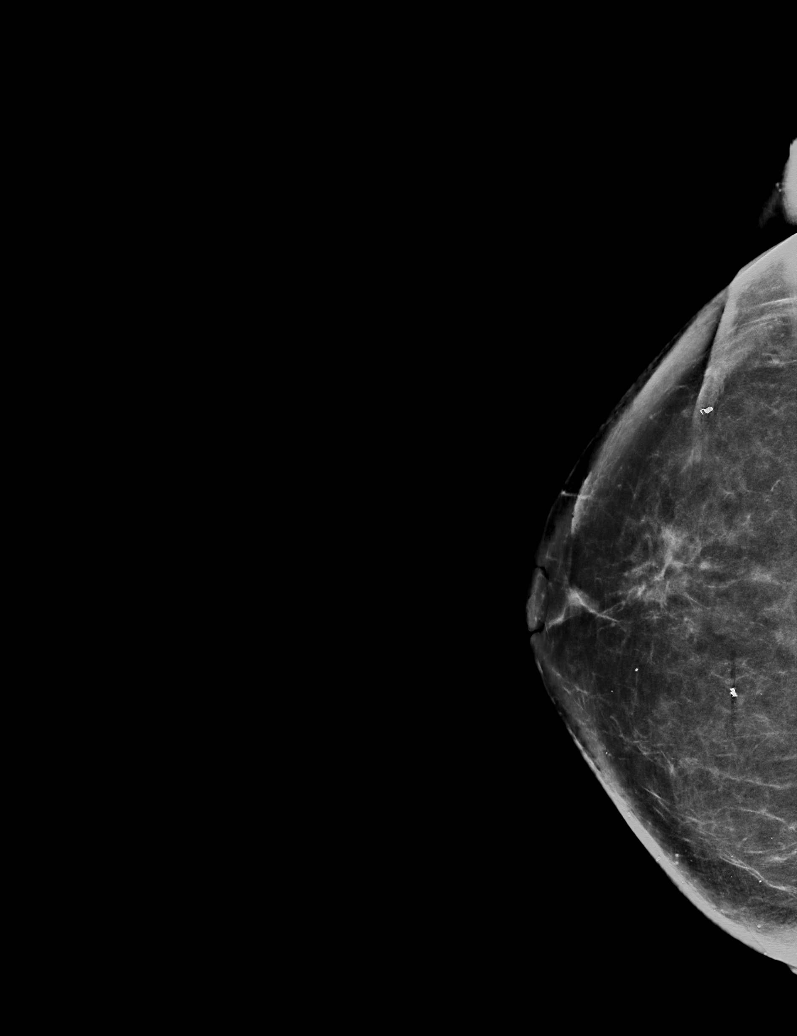

[L MLO synth-2D (2 of 2)]
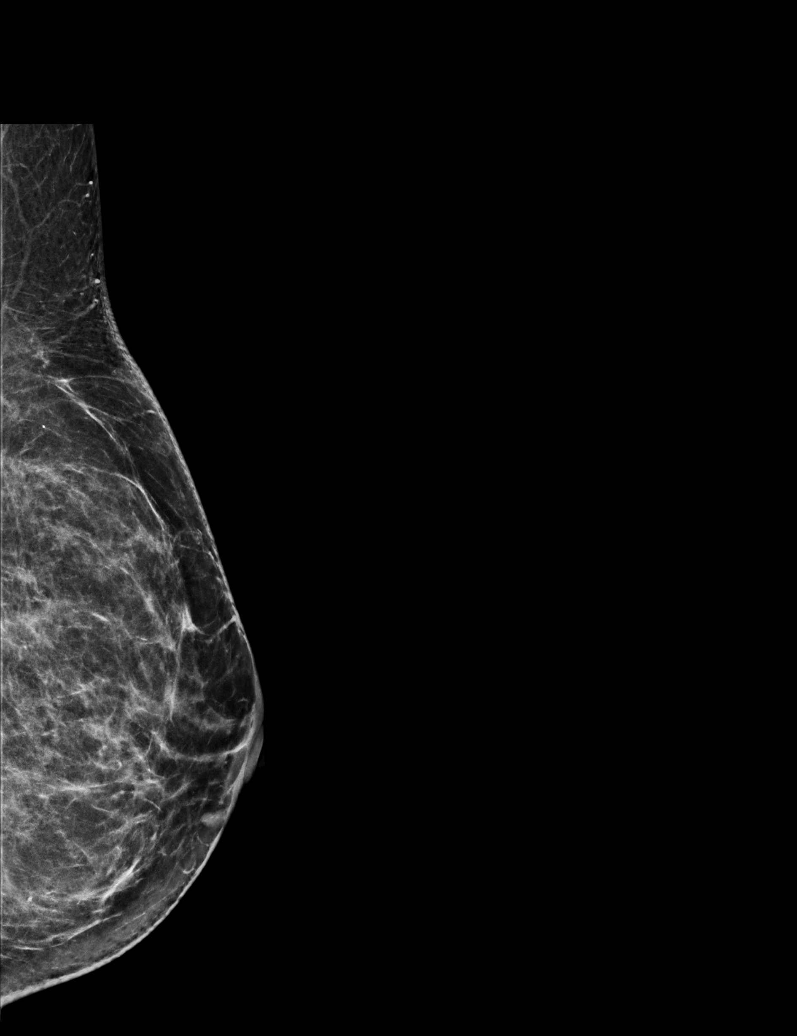

[L CC tomo · tomo slice 35/69.0]
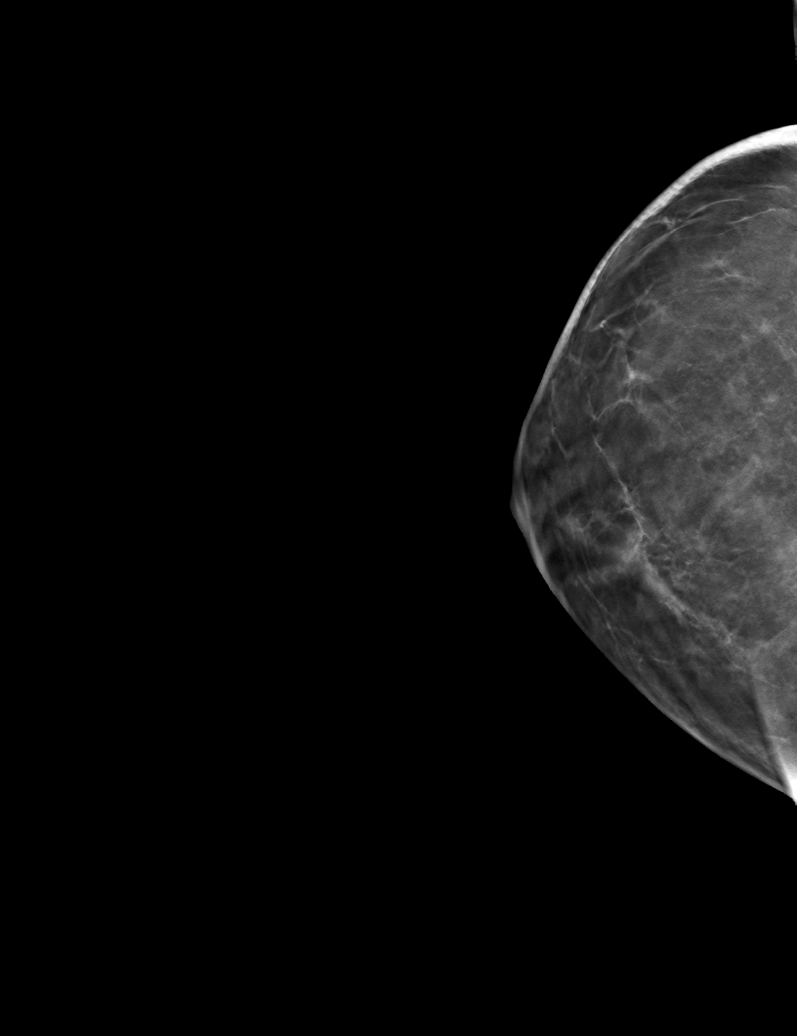

[6 of 30 positions shown; findings below may reference images not displayed]

ACR Breast Density Category b: There are scattered areas of
fibroglandular density.
FINDINGS: There are no findings suspicious for malignancy. The images were
evaluated with computer-aided detection.
IMPRESSION: No mammographic evidence of malignancy. A result letter of this
screening mammogram will be mailed directly to the patient.

RECOMMENDATION:
Screening mammogram in one year. (Code:[OD])

BI-RADS CATEGORY  1: Negative.

## 2020-08-28 ENCOUNTER — Other Ambulatory Visit: Payer: Self-pay | Admitting: Family Medicine

## 2020-08-28 DIAGNOSIS — E785 Hyperlipidemia, unspecified: Secondary | ICD-10-CM

## 2020-08-28 DIAGNOSIS — Z85820 Personal history of malignant melanoma of skin: Secondary | ICD-10-CM

## 2020-08-28 DIAGNOSIS — R946 Abnormal results of thyroid function studies: Secondary | ICD-10-CM

## 2020-08-28 DIAGNOSIS — E1169 Type 2 diabetes mellitus with other specified complication: Secondary | ICD-10-CM

## 2020-08-28 DIAGNOSIS — E1165 Type 2 diabetes mellitus with hyperglycemia: Secondary | ICD-10-CM

## 2020-08-30 ENCOUNTER — Other Ambulatory Visit (INDEPENDENT_AMBULATORY_CARE_PROVIDER_SITE_OTHER): Payer: PPO

## 2020-08-30 ENCOUNTER — Other Ambulatory Visit: Payer: Self-pay

## 2020-08-30 DIAGNOSIS — Z85820 Personal history of malignant melanoma of skin: Secondary | ICD-10-CM

## 2020-08-30 DIAGNOSIS — E785 Hyperlipidemia, unspecified: Secondary | ICD-10-CM

## 2020-08-30 DIAGNOSIS — R946 Abnormal results of thyroid function studies: Secondary | ICD-10-CM

## 2020-08-30 DIAGNOSIS — E1169 Type 2 diabetes mellitus with other specified complication: Secondary | ICD-10-CM | POA: Diagnosis not present

## 2020-08-30 DIAGNOSIS — E1165 Type 2 diabetes mellitus with hyperglycemia: Secondary | ICD-10-CM

## 2020-08-30 LAB — COMPREHENSIVE METABOLIC PANEL
ALT: 17 U/L (ref 0–35)
AST: 13 U/L (ref 0–37)
Albumin: 4.4 g/dL (ref 3.5–5.2)
Alkaline Phosphatase: 51 U/L (ref 39–117)
BUN: 14 mg/dL (ref 6–23)
CO2: 31 mEq/L (ref 19–32)
Calcium: 9.4 mg/dL (ref 8.4–10.5)
Chloride: 103 mEq/L (ref 96–112)
Creatinine, Ser: 0.67 mg/dL (ref 0.40–1.20)
GFR: 88.42 mL/min (ref >60.00)
Glucose, Bld: 169 mg/dL — ABNORMAL HIGH (ref 70–99)
Potassium: 4.2 mEq/L (ref 3.5–5.1)
Sodium: 140 mEq/L (ref 135–145)
Total Bilirubin: 0.6 mg/dL (ref 0.2–1.2)
Total Protein: 6.6 g/dL (ref 6.0–8.3)

## 2020-08-30 LAB — LIPID PANEL
Cholesterol: 187 mg/dL (ref 0–200)
HDL: 45.5 mg/dL (ref >39.00)
LDL Cholesterol: 119 mg/dL — ABNORMAL HIGH (ref 0–99)
NonHDL: 141.26
Total CHOL/HDL Ratio: 4
Triglycerides: 113 mg/dL (ref 0.0–149.0)
VLDL: 22.6 mg/dL (ref 0.0–40.0)

## 2020-08-30 LAB — CBC WITH DIFFERENTIAL/PLATELET
Basophils Absolute: 0.1 10*3/uL (ref 0.0–0.1)
Basophils Relative: 1.2 % (ref 0.0–3.0)
Eosinophils Absolute: 0.2 10*3/uL (ref 0.0–0.7)
Eosinophils Relative: 3.7 % (ref 0.0–5.0)
HCT: 41.3 % (ref 36.0–46.0)
Hemoglobin: 13.8 g/dL (ref 12.0–15.0)
Lymphocytes Relative: 28.3 % (ref 12.0–46.0)
Lymphs Abs: 1.7 10*3/uL (ref 0.7–4.0)
MCHC: 33.4 g/dL (ref 30.0–36.0)
MCV: 87.4 fl (ref 78.0–100.0)
Monocytes Absolute: 0.6 10*3/uL (ref 0.1–1.0)
Monocytes Relative: 9.3 % (ref 3.0–12.0)
Neutro Abs: 3.5 10*3/uL (ref 1.4–7.7)
Neutrophils Relative %: 57.5 % (ref 43.0–77.0)
Platelets: 199 10*3/uL (ref 150.0–400.0)
RBC: 4.73 Mil/uL (ref 3.87–5.11)
RDW: 13.4 % (ref 11.5–15.5)
WBC: 6.1 10*3/uL (ref 4.0–10.5)

## 2020-08-30 LAB — T4, FREE: Free T4: 1.08 ng/dL (ref 0.60–1.60)

## 2020-08-30 LAB — TSH: TSH: 0.44 u[IU]/mL (ref 0.35–5.50)

## 2020-08-30 LAB — HEMOGLOBIN A1C: Hgb A1c MFr Bld: 8.2 % — ABNORMAL HIGH (ref 4.6–6.5)

## 2020-09-03 ENCOUNTER — Other Ambulatory Visit: Payer: Self-pay

## 2020-09-03 ENCOUNTER — Encounter: Payer: Self-pay | Admitting: Family Medicine

## 2020-09-03 ENCOUNTER — Ambulatory Visit (INDEPENDENT_AMBULATORY_CARE_PROVIDER_SITE_OTHER): Payer: PPO | Admitting: Family Medicine

## 2020-09-03 VITALS — BP 136/84 | HR 86 | Temp 98.0°F | Ht 68.5 in | Wt 166.4 lb

## 2020-09-03 DIAGNOSIS — N393 Stress incontinence (female) (male): Secondary | ICD-10-CM

## 2020-09-03 DIAGNOSIS — Z85820 Personal history of malignant melanoma of skin: Secondary | ICD-10-CM

## 2020-09-03 DIAGNOSIS — G629 Polyneuropathy, unspecified: Secondary | ICD-10-CM

## 2020-09-03 DIAGNOSIS — E785 Hyperlipidemia, unspecified: Secondary | ICD-10-CM

## 2020-09-03 DIAGNOSIS — Z789 Other specified health status: Secondary | ICD-10-CM

## 2020-09-03 DIAGNOSIS — T466X5A Adverse effect of antihyperlipidemic and antiarteriosclerotic drugs, initial encounter: Secondary | ICD-10-CM

## 2020-09-03 DIAGNOSIS — G8929 Other chronic pain: Secondary | ICD-10-CM

## 2020-09-03 DIAGNOSIS — R946 Abnormal results of thyroid function studies: Secondary | ICD-10-CM

## 2020-09-03 DIAGNOSIS — E1165 Type 2 diabetes mellitus with hyperglycemia: Secondary | ICD-10-CM

## 2020-09-03 DIAGNOSIS — M8589 Other specified disorders of bone density and structure, multiple sites: Secondary | ICD-10-CM

## 2020-09-03 DIAGNOSIS — M545 Low back pain, unspecified: Secondary | ICD-10-CM

## 2020-09-03 DIAGNOSIS — E1169 Type 2 diabetes mellitus with other specified complication: Secondary | ICD-10-CM

## 2020-09-03 DIAGNOSIS — I10 Essential (primary) hypertension: Secondary | ICD-10-CM

## 2020-09-03 DIAGNOSIS — Z0000 Encounter for general adult medical examination without abnormal findings: Secondary | ICD-10-CM

## 2020-09-03 MED ORDER — GLIPIZIDE 5 MG PO TABS: 5.0000 mg | ORAL_TABLET | Freq: Two times a day (BID) | ORAL | 3 refills | Status: DC

## 2020-09-03 MED ORDER — LANTUS SOLOSTAR 100 UNIT/ML ~~LOC~~ SOPN: 20.0000 [IU] | PEN_INJECTOR | Freq: Every day | SUBCUTANEOUS | 4 refills | Status: DC

## 2020-09-03 MED ORDER — AMLODIPINE BESYLATE 5 MG PO TABS: 5.0000 mg | ORAL_TABLET | Freq: Every day | ORAL | 3 refills | Status: DC

## 2020-09-03 MED ORDER — COLESEVELAM HCL 625 MG PO TABS: 625.0000 mg | ORAL_TABLET | ORAL | 3 refills | Status: DC

## 2020-09-03 MED ORDER — ENALAPRIL MALEATE 20 MG PO TABS: 20.0000 mg | ORAL_TABLET | Freq: Two times a day (BID) | ORAL | 3 refills | Status: DC

## 2020-09-03 MED ORDER — METFORMIN HCL ER 500 MG PO TB24: ORAL_TABLET | ORAL | 3 refills | Status: DC

## 2020-09-03 NOTE — Assessment & Plan Note (Signed)
Chronic, remaining high. Pt has recently implemented healthy diet changes. Continue current medicines without change for now. RTC 6 mo DM f/u visit.

## 2020-09-03 NOTE — Assessment & Plan Note (Signed)
Arthralgias to all statins tried including RYR

## 2020-09-03 NOTE — Assessment & Plan Note (Signed)
Ongoing

## 2020-09-03 NOTE — Assessment & Plan Note (Signed)
Chronic, stable. Continue current regimen. 

## 2020-09-03 NOTE — Assessment & Plan Note (Signed)
Describes neuropathy type symptoms to left pinky toe. Suggested trial vit B12 1061mg daily.

## 2020-09-03 NOTE — Assessment & Plan Note (Addendum)
Statin intolerance. Ran out of welchol. Agrees to restart MWF welchol dosing given LDL levels above goal. The 10-year ASCVD risk score Mikey Bussing DC Jr., et al., 2013) is: 25.9%   Values used to calculate the score:     Age: 69 years     Sex: Female     Is Non-Hispanic African American: No     Diabetic: Yes     Tobacco smoker: No     Systolic Blood Pressure: [id number] mmHg     Is BP treated: Yes     HDL Cholesterol: 45.5 mg/dL     Total Cholesterol: 187 mg/dL

## 2020-09-03 NOTE — Patient Instructions (Addendum)
Call to schedule bone density scan at your convenience: [facility] [phone] If interested, check with pharmacy about new 2 shot shingles series (shingrix).  Restart welchol.  Start vitamin b12 supplement at 1029mg daily.  Good to see you today  Return as needed or in 6 months for follow up visit for diabetes  Health Maintenance After Age 6422After age 71 you are at a higher risk for certain long-term diseases and infections as well as injuries from falls. Falls are a major cause of broken bones and head injuries in people who are older than age 71 Getting regular preventive care can help to keep you healthy and well. Preventive care includes getting regular testing and making lifestyle changes as recommended by your health care provider. Talk with your health care provider about: Which screenings and tests you should have. A screening is a test that checks for a disease when you have no symptoms. A diet and exercise plan that is right for you. What should I know about screenings and tests to prevent falls? Screening and testing are the best ways to find a health problem early. Early diagnosis and treatment give you the best chance of managing medical conditions that are common after age 71 Certain conditions and lifestyle choices may make you more likely to have a fall. Your health care provider may recommend: Regular vision checks. Poor vision and conditions such as cataracts can make you more likely to have a fall. If you wear glasses, make sure to get your prescription updated if your vision changes. Medicine review. Work with your health care provider to regularly review all of the medicines you are taking, including over-the-counter medicines. Ask your health care provider about any side effects that may make you more likely to have a fall. Tell your health care provider if any medicines that you take make you feel dizzy or sleepy. Osteoporosis screening.  Osteoporosis is a condition that causes the bones to get weaker. This can make the bones weak and cause them to break more easily. Blood pressure screening. Blood pressure changes and medicines to control blood pressure can make you feel dizzy. Strength and balance checks. Your health care provider may recommend certain tests to check your strength and balance while standing, walking, or changing positions. Foot health exam. Foot pain and numbness, as well as not wearing proper footwear, can make you more likely to have a fall. Depression screening. You may be more likely to have a fall if you have a fear of falling, feel emotionally low, or feel unable to do activities that you used to do. Alcohol use screening. Using too much alcohol can affect your balance and may make you more likely to have a fall. What actions can I take to lower my risk of falls? General instructions Talk with your health care provider about your risks for falling. Tell your health care provider if: You fall. Be sure to tell your health care provider about all falls, even ones that seem minor. You feel dizzy, sleepy, or off-balance. Take over-the-counter and prescription medicines only as told by your health care provider. These include any supplements. Eat a healthy diet and maintain a healthy weight. A healthy diet includes low-fat dairy products, low-fat (lean) meats, and fiber from whole grains, beans, and lots of fruits and vegetables. Home safety Remove any tripping hazards, such as rugs, cords, and clutter. Install safety equipment such as grab bars in bathrooms and safety rails on stairs. Keep  rooms and walkways well-lit. Activity  Follow a regular exercise program to stay fit. This will help you maintain your balance. Ask your health care provider what types of exercise are appropriate for you. If you need a cane or walker, use it as recommended by your health care provider. Wear supportive shoes that have  nonskid soles.  Lifestyle Do not drink alcohol if your health care provider tells you not to drink. If you drink alcohol, limit how much you have: 0-1 drink a day for women. 0-2 drinks a day for men. Be aware of how much alcohol is in your drink. In the [place] one drink equals one typical bottle of beer (12 oz), one-half glass of wine (5 oz), or one shot of hard liquor (1 oz). Do not use any products that contain nicotine or tobacco, such as cigarettes and e-cigarettes. If you need help quitting, ask your health care provider. Summary Having a healthy lifestyle and getting preventive care can help to protect your health and wellness after age 26. Screening and testing are the best way to find a health problem early and help you avoid having a fall. Early diagnosis and treatment give you the best chance for managing medical conditions that are more common for people who are older than age 32. Falls are a major cause of broken bones and head injuries in people who are older than age 32. Take precautions to prevent a fall at home. Work with your health care provider to learn what changes you can make to improve your health and wellness and to prevent falls. This information is not intended to replace advice given to you by your health care provider. Make sure you discuss any questions you have with your healthcare provider. Document Revised: 01/12/2020 Document Reviewed: 01/12/2020 Elsevier Patient Education  2022 [employer].

## 2020-09-03 NOTE — Assessment & Plan Note (Signed)
Continue yearly derm check

## 2020-09-03 NOTE — Assessment & Plan Note (Signed)
Due for DEXA - I have ordered.  Continues calcium and vit D supplementation.

## 2020-09-03 NOTE — Assessment & Plan Note (Addendum)
TSH, fT4 normal today.

## 2020-09-03 NOTE — Progress Notes (Addendum)
Patient ID: Elizabeth Curtis, female    DOB: May 30, 1950, 70 y.o.   MRN: [id number]  This visit was conducted in person.  BP 136/84   Pulse 86   Temp 98 F (36.7 C) (Temporal)   Ht 5' 8.5" (1.74 m)   Wt 166 lb 6 oz (75.5 kg)   SpO2 99%   BMI 24.93 kg/m    CC: CPE/AMW Subjective:   HPI: Elizabeth Curtis is a 70 y.o. female presenting on 09/03/2020 for Medicare Wellness   Did not see health advisor.   Hearing Screening   '500Hz'$  '1000Hz'$  '2000Hz'$  '4000Hz'$   Right ear 40 40 25 25  Left ear '25 25 25 25  '$ Comments: Wears L hearing aid.  Not wearing at today's OV.   Vision Screening - Comments:: Last eye exam, 10/2019.  [facility] Office Visit from 09/03/2020 in [place] at [facility]  PHQ-2 Total Score 0       Fall Risk  09/03/2020 08/04/2019 07/15/2018 06/30/2017 04/29/2016  Falls in the past year? 0 0 0 No Yes  Number falls in past yr: - - - - 1  Injury with Fall? - - - - No  Risk for fall due to : - - - - -  Follow up - - - - -  Comment - - - - -    Mother died 10-01-2019 - she wasn't doing well prior.   Known cervical DDD by prior brain MRI. Feels turmeric has helped back pain.   Notes neuropathy type symptoms to left lateral foot at pinky toe.   Preventative: Colonoscopy - 2010 [place], 1 polyp, 42m lipoma, o/w normal, rec rpt 10 yrs. COLONOSCOPY 02/2020 - TAx3, HPx1, hemorrhoids, diverticulosis, lipoma, rpt 360yr([id number]. Paps normal per patient.  LMP - 2010. Menopausal.  Mammogram - 05/2019 - s/p benign biopsy complicated by hematoma (fibrocystic changes with apocrine metaplasia.  Mammogram 05/2020 Bir[facility]1 @ [facility]  DEXA 03/27/15 Osteopenia - not regular with calcium or vit D, hasn't been walking regularly.  Lung cancer screening - not eligible  Flu shot yearly. COVID vaccine Pfizer 03/2019 x2, booster 11/2019 Tetanus 2000, 2010.  Pneumovax 09/2011-08-22felt sick afterwards - declines repeat). Prevnar 02/2015.  Shingrix -  discussed, interested   Advanced directives: living will scanned into chart 2017. Husband KeChrissie Noahen son ChGerald Stabsre HCGratzDoes not want prolonged life support if terminally or incurably ill.  Seat belt use discussed Sunscreen use discussed, no changing skin lesions. 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 Interim medical history since our last visit reviewed. Allergies and medications reviewed and updated. Outpatient Medications Prior to Visit  Medication Sig Dispense Refill   Calcium Carbonate-Vitamin D (CALCIUM-VITAMIN D3 PO) Take by mouth. 1200 mg of calcium; 1000 units of Vit D3     Cholecalciferol (VITAMIN D3 PO) Take 5,000 Units by mouth daily.     glucose blood (FREESTYLE LITE) test strip USE TO CHECK SUGAR ONCE DAILY AND AS NEEDED. DX:E11.65 100 each 3   Insulin  Pen Needle (BD PEN NEEDLE MICRO U/F) 32G X 6 MM MISC Use as directed once daliy 100 each 3   Magnesium 250 MG TABS Take by mouth.     MISC NATURAL PRODUCTS PO Take by mouth. CBD supplement 0.5 mg by mouth daily 1500 mg PRN     OVER THE COUNTER MEDICATION CBD PM 1000 mg with Melatonin as needed for sleep     TURMERIC CURCUMIN PO Take 750 mg by mouth daily.     ZADITOR 0.025 % ophthalmic solution Apply to eye.     amLODipine (NORVASC) 5 MG tablet Take 1 tablet (5 mg total) by mouth at bedtime. 90 tablet 3   colesevelam (WELCHOL) 625 MG tablet Take 1 tablet (625 mg total) by mouth every Monday, Wednesday, and Friday. 30 tablet 3   enalapril (VASOTEC) 20 MG  tablet Take 1 tablet (20 mg total) by mouth 2 (two) times daily. 180 tablet 3   glipiZIDE (GLUCOTROL) 5 MG tablet Take 1 tablet (5 mg total) by mouth 2 (two) times daily before a meal. 180 tablet 3   insulin glargine (LANTUS SOLOSTAR) 100 UNIT/ML Solostar Pen Inject 20 Units into the skin daily. 15 mL 4   metFORMIN (GLUCOPHAGE-XR) 500 MG 24 hr tablet TAKE 1 (ONE) TABLET IN AM AND 2 (TWO) TABLETS IN PM WITH MEALS 270 tablet 3   0.9 %  sodium chloride infusion      No facility-administered medications prior to visit.     Per HPI unless specifically indicated in ROS section below Review of Systems  Constitutional:  Negative for activity change, appetite change, chills, fatigue, fever and unexpected weight change.  HENT:  Negative for hearing loss.   Eyes:  Negative for visual disturbance.  Respiratory:  Negative for chest tightness, shortness of breath and wheezing.  Occasional cough Cardiovascular:  Negative for chest pain, palpitations and leg swelling.  Gastrointestinal:  Negative for abdominal distention, abdominal pain, blood in stool, constipation, diarrhea, nausea and vomiting.  Genitourinary:  Negative for difficulty urinating and hematuria.  Musculoskeletal:  Negative for arthralgias, myalgias and neck pain.  Skin:  Negative for rash.  Neurological:  Negative for dizziness, seizures, syncope and headaches.  Hematological:  Negative for adenopathy. Does bruise/bleed easily.  Psychiatric/Behavioral:  Negative for dysphoric mood. The patient is not nervous/anxious.    Objective:  BP 136/84   Pulse 86   Temp 98 F (36.7 C) (Temporal)   Ht 5' 8.5" (1.74 m)   Wt 166 lb 6 oz (75.5 kg)   SpO2 99%   BMI 24.93 kg/m   Wt Readings from Last 3 Encounters:  09/03/20 166 lb 6 oz (75.5 kg)  03/08/20 171 lb (77.6 kg)  01/03/20 171 lb (77.6 kg)      Physical Exam Vitals and nursing note reviewed.  Constitutional:      Appearance: Normal appearance. She is not ill-appearing.  HENT:      Head: Normocephalic and atraumatic.     Right Ear: Tympanic membrane, ear canal and external ear normal. There is no impacted cerumen.     Left Ear: Tympanic membrane, ear canal and external ear normal. There is no impacted cerumen.  Eyes:     General:        Right eye: No discharge.        Left eye: No discharge.     Extraocular Movements: Extraocular movements intact.     Conjunctiva/sclera: Conjunctivae normal.     Pupils: Pupils are equal, round, and reactive to  light.  Neck:     Thyroid: No thyroid mass or thyromegaly.     Vascular: No carotid bruit.  Cardiovascular:     Rate and Rhythm: Normal rate and regular rhythm.     Pulses: Normal pulses.     Heart sounds: Normal heart sounds. No murmur heard. Pulmonary:     Effort: Pulmonary effort is normal. No respiratory distress.     Breath sounds: Normal breath sounds. No wheezing, rhonchi or rales.  Abdominal:     General: Bowel sounds are normal. There is no distension.     Palpations: Abdomen is soft. There is no mass.     Tenderness: There is no abdominal tenderness. There is no guarding or rebound.     Hernia: No hernia is present.  Musculoskeletal:     Cervical back: Normal range of motion and neck supple. No rigidity.     Right lower leg: No edema.     Left lower leg: No edema.  Lymphadenopathy:     Cervical: No cervical adenopathy.  Skin:    General: Skin is warm and dry.     Findings: No rash.  Neurological:     General: No focal deficit present.     Mental Status: She is alert. Mental status is at baseline.  Recall 3/3  Calculation 5/5 DLROW Psychiatric:        Mood and Affect: Mood normal.        Behavior: Behavior normal.   Diabetic Foot Exam - Simple   Simple Foot Form Diabetic Foot exam was performed with the following findings: Yes 09/03/2020 11:42 AM  Visual Inspection No deformities, no ulcerations, no other skin breakdown bilaterally: Yes Sensation Testing Intact to touch and monofilament testing  bilaterally: Yes Pulse Check Posterior Tibialis and Dorsalis pulse intact bilaterally: Yes Comments        Results for orders placed or performed in visit on 08/30/20  CBC with Differential/Platelet  Result Value Ref Range   WBC 6.1 4.0 - 10.5 K/uL   RBC 4.73 3.87 - 5.11 Mil/uL   Hemoglobin 13.8 12.0 - 15.0 g/dL   HCT 41.3 36.0 - 46.0 %   MCV 87.4 78.0 - 100.0 fl   MCHC 33.4 30.0 - 36.0 g/dL   RDW 13.4 11.5 - 15.5 %   Platelets 199.0 150.0 - 400.0 K/uL   Neutrophils Relative % 57.5 43.0 - 77.0 %   Lymphocytes Relative 28.3 12.0 - 46.0 %   Monocytes Relative 9.3 3.0 - 12.0 %   Eosinophils Relative 3.7 0.0 - 5.0 %   Basophils Relative 1.2 0.0 - 3.0 %   Neutro Abs 3.5 1.4 - 7.7 K/uL   Lymphs Abs 1.7 0.7 - 4.0 K/uL   Monocytes Absolute 0.6 0.1 - 1.0 K/uL   Eosinophils Absolute 0.2 0.0 - 0.7 K/uL   Basophils Absolute 0.1 0.0 - 0.1 K/uL  T4, free  Result Value Ref Range   Free T4 1.08 0.60 - 1.60 ng/dL  TSH  Result Value Ref Range   TSH 0.44 0.35 - 5.50 uIU/mL  Hemoglobin A1c  Result Value Ref Range   Hgb A1c MFr Bld 8.2 (H) 4.6 - 6.5 %  Comprehensive metabolic panel  Result Value Ref Range   Sodium 140 135 - 145 mEq/L   Potassium 4.2 3.5 - 5.1 mEq/L   Chloride 103 96 - 112 mEq/L   CO2 31 19 - 32 mEq/L   Glucose, Bld 169 (H) 70 - 99 mg/dL   BUN 14 6 -  23 mg/dL   Creatinine, Ser 0.67 0.40 - 1.20 mg/dL   Total Bilirubin 0.6 0.2 - 1.2 mg/dL   Alkaline Phosphatase 51 39 - 117 U/L   AST 13 0 - 37 U/L   ALT 17 0 - 35 U/L   Total Protein 6.6 6.0 - 8.3 g/dL   Albumin 4.4 3.5 - 5.2 g/dL   GFR 88.42 >60.00 mL/min   Calcium 9.4 8.4 - 10.5 mg/dL  Lipid panel  Result Value Ref Range   Cholesterol 187 0 - 200 mg/dL   Triglycerides 113.0 0.0 - 149.0 mg/dL   HDL 45.50 >39.00 mg/dL   VLDL 22.6 0.0 - 40.0 mg/dL   LDL Cholesterol 119 (H) 0 - 99 mg/dL   Total CHOL/HDL Ratio 4    NonHDL 141.26   No results found for: VITAMINB12  Assessment & Plan:  This visit occurred during  the SARS-CoV-2 public health emergency.  Safety protocols were in place, including screening questions prior to the visit, additional usage of staff PPE, and extensive cleaning of exam room while observing appropriate contact time as indicated for disinfecting solutions.   Problem List Items Addressed This Visit     Healthcare maintenance (Chronic)    Preventative protocols reviewed and updated unless pt declined. Discussed healthy diet and lifestyle.        Medicare annual wellness visit, subsequent - Primary (Chronic)    I have personally reviewed the Medicare Annual Wellness questionnaire and have noted 1. The patient's medical and social history 2. Their use of alcohol, tobacco or illicit drugs 3. Their current medications and supplements 4. The patient's functional ability including ADL's, fall risks, home safety risks and hearing or visual impairment. Cognitive function has been assessed and addressed as indicated.  5. Diet and physical activity 6. Evidence for depression or mood disorders The patients weight, height, BMI have been recorded in the chart. I have made referrals, counseling and provided education to the patient based on review of the above and I have provided the pt with a written personalized care plan for preventive services. Provider list updated.. See scanned questionairre as needed for further documentation. Reviewed preventative protocols and updated unless pt declined.       Diabetes mellitus type 2, uncontrolled (HCC)    Chronic, remaining high. Pt has recently implemented healthy diet changes. Continue current medicines without change for now. RTC 6 mo DM f/u visit.        Relevant Medications   enalapril (VASOTEC) 20 MG tablet   glipiZIDE (GLUCOTROL) 5 MG tablet   insulin glargine (LANTUS SOLOSTAR) 100 UNIT/ML Solostar Pen   metFORMIN (GLUCOPHAGE-XR) 500 MG 24 hr tablet   Essential hypertension, benign    Chronic, stable. Continue current regimen.         Relevant Medications   amLODipine (NORVASC) 5 MG tablet   enalapril (VASOTEC) 20 MG tablet   colesevelam (WELCHOL) 625 MG tablet (Start on 09/04/2020)   Chronic low back pain    Benefiting from turmeric supplement.        History of malignant melanoma    Continue yearly derm check       Hyperlipidemia associated with type 2 diabetes mellitus (HCC)    Statin intolerance. Ran out of welchol. Agrees to restart MWF welchol dosing given LDL levels above goal. The 10-year ASCVD risk score Mikey Bussing DC Jr., et al., 2013) is: 25.9%   Values used to calculate the score:     Age: 50 years     Sex:  Female     Is Non-Hispanic African American: No     Diabetic: Yes     Tobacco smoker: No     Systolic Blood Pressure: [id number] mmHg     Is BP treated: Yes     HDL Cholesterol: 45.5 mg/dL     Total Cholesterol: 187 mg/dL        Relevant Medications   enalapril (VASOTEC) 20 MG tablet   glipiZIDE (GLUCOTROL) 5 MG tablet   insulin glargine (LANTUS SOLOSTAR) 100 UNIT/ML Solostar Pen   metFORMIN (GLUCOPHAGE-XR) 500 MG 24 hr tablet   colesevelam (WELCHOL) 625 MG tablet (Start on 09/04/2020)   Abnormal thyroid function test    TSH, fT4 normal today.        Osteopenia    Due for DEXA - I have ordered.  Continues calcium and vit D supplementation.        Relevant Orders   DG Bone Density   Statin intolerance   Stress incontinence    Ongoing.        Adverse reaction to statin medication    Arthralgias to all statins tried including RYR       Neuropathy    Describes neuropathy type symptoms to left pinky toe. Suggested trial vit B12 1046mg daily.          Meds ordered this encounter  Medications   amLODipine (NORVASC) 5 MG tablet    Sig: Take 1 tablet (5 mg total) by mouth at bedtime.    Dispense:  90 tablet    Refill:  3   DISCONTD: colesevelam (WELCHOL) 625 MG tablet    Sig: Take 1 tablet (625 mg total) by mouth every Monday, Wednesday, and Friday.    Dispense:  30  tablet    Refill:  3   enalapril (VASOTEC) 20 MG tablet    Sig: Take 1 tablet (20 mg total) by mouth 2 (two) times daily.    Dispense:  180 tablet    Refill:  3   glipiZIDE (GLUCOTROL) 5 MG tablet    Sig: Take 1 tablet (5 mg total) by mouth 2 (two) times daily before a meal.    Dispense:  180 tablet    Refill:  3   insulin glargine (LANTUS SOLOSTAR) 100 UNIT/ML Solostar Pen    Sig: Inject 20 Units into the skin daily.    Dispense:  15 mL    Refill:  4   metFORMIN (GLUCOPHAGE-XR) 500 MG 24 hr tablet    Sig: TAKE 1 (ONE) TABLET IN AM AND 2 (TWO) TABLETS IN PM WITH MEALS    Dispense:  270 tablet    Refill:  3   colesevelam (WELCHOL) 625 MG tablet    Sig: Take 1 tablet (625 mg total) by mouth every Monday, Wednesday, and Friday.    Dispense:  39 tablet    Refill:  3   Orders Placed This Encounter  Procedures   DG Bone Density    Standing Status:   Future    Standing Expiration Date:   09/03/2021    Order Specific Question:   Reason for Exam (SYMPTOM  OR DIAGNOSIS REQUIRED)    Answer:   osteopenia f/u    Order Specific Question:   Pref[facility] imaging [facility]?    [facility]   [facility]   Patient instructions: [facility]([phone] interested, check with pharmacy about new 2 shot shingles series (shingrix).  Restart  welchol.  Start vitamin b12 supplement at 1035mg daily.  Good to see you today  Return as needed or in 6 months for follow up visit for diabetes  Follow up plan: Return in about 6 months (around 03/06/2021) for follow up visit.  JRia Bush MD

## 2020-09-03 NOTE — Assessment & Plan Note (Signed)

## 2020-09-03 NOTE — Assessment & Plan Note (Signed)
Preventative protocols reviewed and updated unless pt declined. Discussed healthy diet and lifestyle.  

## 2020-09-03 NOTE — Assessment & Plan Note (Signed)
Benefiting from turmeric supplement.

## 2020-09-09 ENCOUNTER — Other Ambulatory Visit: Payer: Self-pay

## 2020-09-09 ENCOUNTER — Ambulatory Visit
Admission: RE | Admit: 2020-09-09 | Discharge: 2020-09-09 | Disposition: A | Discharge status: Home / Self Care | Payer: PPO | Source: Ambulatory Visit | Attending: Family Medicine | Admitting: Family Medicine

## 2020-09-09 DIAGNOSIS — Z780 Asymptomatic menopausal state: Secondary | ICD-10-CM | POA: Diagnosis not present

## 2020-09-09 DIAGNOSIS — M8589 Other specified disorders of bone density and structure, multiple sites: Secondary | ICD-10-CM

## 2020-09-20 ENCOUNTER — Encounter: Payer: Self-pay | Admitting: Family Medicine

## 2020-09-23 ENCOUNTER — Other Ambulatory Visit: Payer: Self-pay | Admitting: Family Medicine

## 2020-09-23 MED ORDER — FREESTYLE LIBRE 14 DAY READER DEVI: 1.0000 | 3 refills | Status: DC

## 2020-09-23 MED ORDER — FREESTYLE LIBRE 14 DAY SENSOR MISC: 1.0000 | 3 refills | Status: DC

## 2020-09-24 NOTE — Telephone Encounter (Signed)
Freestyle libre 2 reader sent in

## 2020-10-17 ENCOUNTER — Encounter: Payer: Self-pay | Admitting: Family Medicine

## 2020-10-17 NOTE — Telephone Encounter (Addendum)
Spoke with Access Nurse to have pt triaged.  FYI to Dr. Darnell Level.

## 2020-10-18 ENCOUNTER — Ambulatory Visit: Payer: Self-pay

## 2020-10-18 ENCOUNTER — Telehealth: Payer: Self-pay

## 2020-10-18 NOTE — Telephone Encounter (Signed)
[place] Night - Client TELEPHONE ADVICE RECORD [facility] Patient Name: Elizabeth Curtis Gender: Female DOB: 07-Mar-1950 Age: 70 Y 43 M 5 D Return Phone Number: Elizabeth Curtis:[phone] (Primary) Address: City/ State/ Zip: [place] [address] Client [place] Night - Client Client Site [place] Physician [place], NOT LISTED- MD Contact Type Call Who Is Calling Patient / Member / Family / Caregiver Call Type Triage / Clinical Caller Name Elizabeth Curtis Relationship To Patient Provider Return Phone Number [phone] (Primary) Chief Complaint SEVERE ABDOMINAL PAIN - Severe pain in abdomen Reason for Call Symptomatic / Request for [place]ese states , and calling about a my chart pt who is having severe lower abdominal pain. Tried garlic tablets but only gave temporary relief, also tried Colesevum but it did not work. Also the pt having tingling and numbness in her legs and feet. Translation No Nurse Assessment Nurse: Elizabeth Montana, RN, Elizabeth Curtis Date/Time Elizabeth Curtis Time): 10/17/2020 6:03:55 PM Confirm and document reason for call. If symptomatic, describe symptoms. ---Caller has abd pain. Caller states she doesn't feel like eating. Caller states no other sx. Does the patient have any new or worsening symptoms? ---Yes Will a triage be completed? ---Yes Related visit to physician within the last 2 weeks? ---No Does the PT have any chronic conditions? (i.e. diabetes, asthma, this includes High risk factors for pregnancy, etc.) ---Yes List chronic conditions. ---DM ,HTN Is this a behavioral health or substance abuse call? ---No Guidelines Guideline Title Affirmed Question Affirmed Notes Nurse Date/Time (Eastern Time) Abdominal Pain - Female [1] MODERATE pain (e.g., interferes with normal activities) AND [2] pain comes and goes (cramps) AND [3] present >  24 hours Elizabeth Curtis, [place], Elizabeth Curtis 10/17/2020 6:04:49 PM PLEASE NOTE: All timestamps contained within this report are represented as [place] Standard Time. CONFIDENTIALTY NOTICE: This fax transmission is intended only for the addressee. It contains information that is legally privileged, confidential or otherwise protected from use or disclosure. If you are not the intended recipient, you are strictly prohibited from reviewing, disclosing, copying using or disseminating any of this information or taking any action in reliance on or regarding this information. If you have received this fax in error, please notify us immediately by telephone so that we can arrange for its return to [place]. Phone: [phone], Toll-Free: [phone], Fax: [phone] Page: 2 of 2 Call Id: [id number] Guidelines Guideline Title Affirmed Question Affirmed Notes Nurse Date/Time Elizabeth Curtis Time) (Exception: pain with Vomiting or Diarrhea - see that Guideline) Disp. Time Elizabeth Curtis Time) Disposition Final User 10/17/2020 6:00:10 PM Send to Urgent Queue Elizabeth Curtis, Elizabeth Curtis 10/17/2020 6:10:08 PM See PCP within 24 Hours Yes Elizabeth Montana, RN, Elizabeth Curtis Disagree/Comply Elizabeth Curtis Understands Yes PreDisposition Call Doctor Care Advice Given Per Guideline SEE PCP WITHIN 24 HOURS: * IF OFFICE WILL BE OPEN: You need to be examined within the next 24 hours. Call your doctor (or NP/PA) when the office opens and make an appointment. DIET: * Drink adequate fluids. Eat a bland diet. * Avoid alcohol or caffeinated beverages * Avoid greasy or fatty foods. CALL BACK IF: * Severe pain lasts over 1 hour * Constant pain lasts over 2 hours * You become worse CARE ADVICE given per Abdominal Pain, Female (Adult) guideline. Referrals REFERRED TO PCP OFFICE

## 2020-10-18 NOTE — Telephone Encounter (Signed)
I spoke with pt who is still having abd pain but not as bad as yesterday. Pt said the pain worsens when she tries to walk. Pt said she would go to UC; I scheduled appt at [facility] in [place] on 10/18/20 at 3:45. UC & ED precautions given and pt voiced understanding. Sending note to Dr Darnell Level and Lattie Haw CMA. Will send note by teams to [facility].

## 2020-10-18 NOTE — Telephone Encounter (Signed)
Noted! Thank you

## 2020-10-18 NOTE — Addendum Note (Signed)
Addended by: Ria Bush on: 10/18/2020 03:36 PM   Modules accepted: Orders

## 2020-10-21 ENCOUNTER — Ambulatory Visit (INDEPENDENT_AMBULATORY_CARE_PROVIDER_SITE_OTHER): Payer: PPO | Admitting: Nurse Practitioner

## 2020-10-21 ENCOUNTER — Other Ambulatory Visit: Payer: Self-pay

## 2020-10-21 ENCOUNTER — Encounter: Payer: Self-pay | Admitting: Nurse Practitioner

## 2020-10-21 ENCOUNTER — Telehealth: Payer: Self-pay | Admitting: *Deleted

## 2020-10-21 VITALS — BP 138/82 | HR 76 | Temp 97.9°F | Resp 12 | Ht 68.5 in | Wt 163.1 lb

## 2020-10-21 DIAGNOSIS — R1030 Lower abdominal pain, unspecified: Secondary | ICD-10-CM | POA: Diagnosis not present

## 2020-10-21 DIAGNOSIS — R1032 Left lower quadrant pain: Secondary | ICD-10-CM | POA: Diagnosis not present

## 2020-10-21 DIAGNOSIS — R197 Diarrhea, unspecified: Secondary | ICD-10-CM | POA: Insufficient documentation

## 2020-10-21 LAB — CBC
HCT: 39 % (ref 36.0–46.0)
Hemoglobin: 13.1 g/dL (ref 12.0–15.0)
MCHC: 33.6 g/dL (ref 30.0–36.0)
MCV: 86.5 fl (ref 78.0–100.0)
Platelets: 256 10*3/uL (ref 150.0–400.0)
RBC: 4.51 Mil/uL (ref 3.87–5.11)
RDW: 13 % (ref 11.5–15.5)
WBC: 7.8 10*3/uL (ref 4.0–10.5)

## 2020-10-21 LAB — LIPASE: Lipase: 231 U/L — ABNORMAL HIGH (ref 11.0–59.0)

## 2020-10-21 LAB — COMPREHENSIVE METABOLIC PANEL
ALT: 10 U/L (ref 0–35)
AST: 10 U/L (ref 0–37)
Albumin: 4.1 g/dL (ref 3.5–5.2)
Alkaline Phosphatase: 52 U/L (ref 39–117)
BUN: 18 mg/dL (ref 6–23)
CO2: 30 mEq/L (ref 19–32)
Calcium: 9.8 mg/dL (ref 8.4–10.5)
Chloride: 100 mEq/L (ref 96–112)
Creatinine, Ser: 0.68 mg/dL (ref 0.40–1.20)
GFR: 88.02 mL/min (ref >60.00)
Glucose, Bld: 119 mg/dL — ABNORMAL HIGH (ref 70–99)
Potassium: 4.1 mEq/L (ref 3.5–5.1)
Sodium: 139 mEq/L (ref 135–145)
Total Bilirubin: 0.3 mg/dL (ref 0.2–1.2)
Total Protein: 7.1 g/dL (ref 6.0–8.3)

## 2020-10-21 LAB — POCT URINALYSIS DIP (CLINITEK)
Bilirubin, UA: NEGATIVE
Blood, UA: NEGATIVE
Glucose, UA: NEGATIVE mg/dL
Ketones, POC UA: NEGATIVE mg/dL
Leukocytes, UA: NEGATIVE
Nitrite, UA: NEGATIVE
POC PROTEIN,UA: NEGATIVE
Spec Grav, UA: 1.015 (ref 1.010–1.025)
Urobilinogen, UA: NEGATIVE E.U./dL — AB
pH, UA: 5.5 (ref 5.0–8.0)

## 2020-10-21 NOTE — Patient Instructions (Signed)
Nice to see you today. WIll be in touch regarding your labs and CT scan. Follow up if symptoms fail to improve or worsen.

## 2020-10-21 NOTE — Assessment & Plan Note (Signed)
Patient having bilateral lower abdominal pain.  Originally thought it was from a recent prescription of Colesevelam.  He had reached out via MyChart and was instructed to stop taking that medication.  Did look up adverse events for medication abdominal pain should not be one of them but GI upset is.  Did discuss possibility diverticulitis with patient we will pursue labs and CT scan.

## 2020-10-21 NOTE — Addendum Note (Signed)
Addended by: Kris Mouton on: 10/21/2020 03:35 PM   Modules accepted: Orders

## 2020-10-21 NOTE — Telephone Encounter (Signed)
PLEASE NOTE: All timestamps contained within this report are represented as [place] Standard Time. CONFIDENTIALTY NOTICE: This fax transmission is intended only for the addressee. It contains information that is legally privileged, confidential or otherwise protected from use or disclosure. If you are not the intended recipient, you are strictly prohibited from reviewing, disclosing, copying using or disseminating any of this information or taking any action in reliance on or regarding this information. If you have received this fax in error, please notify us immediately by telephone so that we can arrange for its return to [place]. Phone: [phone], Toll-Free: [phone], Fax: [phone] Page: 1 of 1 Call Id: [id number] [place] Night - Client Nonclinical Telephone Record  [facility] Client [place] Night - Client Client Site [place] Physician Ria Bush - MD Contact Type Call Who Is Calling Patient / Member / Family / Caregiver Caller Name [place] Phone Number [phone] Patient Name Elizabeth Curtis Patient DOB 1950/12/15 Call Type Message Only Information Provided Reason for Call Request to Schedule Office Appointment Initial Comment Caller states she is calling to schedule an appt. Patient request to speak to RN No Additional Comment Office hours provided. Declined triage Disp. Time Disposition Final User 10/21/2020 8:09:04 AM General Information Provided Yes Mikki Santee Call Closed By: Mikki Santee Transaction Date/Time: 10/21/2020 8:05:31 AM (ET)

## 2020-10-21 NOTE — Telephone Encounter (Signed)
Noted will see patient in office 

## 2020-10-21 NOTE — Telephone Encounter (Signed)
Patient has an appointment scheduled today with Romilda Garret NP at 12:00 noon.

## 2020-10-21 NOTE — Assessment & Plan Note (Signed)
Patient been having symptoms for approximately 1 week's, settled over the past few days the lower abdomen.  No history of diverticulitis per patient report patient has had a colonoscopy in the past that did show diverticulosis.  Patient is tender in right lower and left lower quadrants, with diarrhea and some nausea.  Pending lab results we will pursue CT scan abdomen pelvis with and without contrast to rule out diverticulitis.

## 2020-10-21 NOTE — Assessment & Plan Note (Signed)
States she did take half of an Imodium yesterday that helped minimally.  Has not taken one since.

## 2020-10-21 NOTE — Progress Notes (Addendum)
Acute Office Visit  Subjective:    Patient ID: Elizabeth Curtis, female    DOB: September 11, 1950, 70 y.o.   MRN: [id number]  Chief Complaint  Patient presents with   Abdominal Pain    Started last week-lower abdomen area bilateral. It hurt with walking/movement. Had a lot of gas some days last week. By 10/18/20 stomach pain was just located at that time on the left side only. Was not eating much the past few days. Diarrhea yesterday 10/20/20-took Immodium.     Patient is in today for Lower abdmonial pain.  Started last Wednesday 10/16/2020. States bilateral lower abdomen. States it hurt when she walked and then that resolved. For the last couple of days pain has settled into her left lower quadrant. States that the pain is intermittent  with cramping and a sharp pain. Yesterday and today she has had diarrhea. Denies blood or mucous in her stool. No history of major abdominal surgery. Has had C-section and melanoma removal from her upper abdomen.   Past Medical History:  Diagnosis Date   Allergy    Arthritis    OA   Blood transfusion without reported diagnosis    with C Section    Cataract 2020   removed both eyes    Chronic low back pain    Diabetes mellitus    Diffuse cystic mastopathy    Disturbance of skin sensation    Diverticulosis of colon (without mention of hemorrhage)    Dysphagia, unspecified(787.20)    Essential hypertension, benign 1995   H/O cystitis 2011   History of colonic polyps    Hyperplastic   History of pelvic ultrasound 03/05   Uterus 13 X 7 X 8cm, Fibroid 2.6cm   HLD (hyperlipidemia)    borderline    Lipoma    Mammographic microcalcification 2013   MDD (major depressive disorder) 11/22/2008   Melanoma ([place])    upper abdomin left SN removed under axilla   Osteopenia 03/2015   DEXA hip -1.7, spine -1.7   Plantar wart    Schatzki's ring 07/23/08   EGD dilated O/W normal (Dr. 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    Vision abnormalities     Past Surgical History:  Procedure Laterality Date   BREAST BIOPSY Right 07/2011   rec rpt 6 mo, neg   BREAST BIOPSY Right 06/07/2019   [place] core  coil clip - fibrocystic change with apocrine metaplasia   [place]   [place] presentation   COLONOSCOPY  2010   [place], rec rpt 10 yrs   COLONOSCOPY  02/2020   multiple polyps, hem, diverticulosis, lipoma, rpt 3 yrs ([place])   ESOPHAGOGASTRODUODENOSCOPY  2010   [place], dilated schatzki ring   EYE SURGERY Left 2012   cataract   INTRAOCULAR LENS EXCHANGE Left 05/2017   uveitis-glaucoma-hypheme syndrome ([place])   KNEE ARTHROSCOPY W/ MENISCAL REPAIR Left 01/2014   [place]   MELANOMA EXCISION  2012   upper abdomen   POLYPECTOMY     2008 TA, 2010 [facility]    UPPER GASTROINTESTINAL ENDOSCOPY      Family History  Problem Relation Age of Onset   Diabetes Mother    Hyperlipidemia Mother    Hypertension Mother    Mental illness Mother        Personality disorder   Kidney disease Father        failure, (Diaylsis)   Diabetes Father    Diabetes Brother    Coronary  artery disease Brother 18       CAD, hospitalized freq, PTCA   Gout Brother    Hypertension Brother    Cancer Maternal Aunt 35       breast   Breast cancer Maternal Aunt 66   Cancer Paternal Aunt 70       breast   Breast cancer Paternal Aunt 53   ALS Paternal Aunt    Stroke Paternal Grandfather    Stroke Maternal Grandmother    Breast cancer Maternal Aunt 70   Colon cancer Neg Hx    Colon polyps Neg Hx    Esophageal cancer Neg Hx    Rectal cancer Neg Hx    Stomach cancer Neg Hx     Social History   Socioeconomic History   Marital status: Married    Spouse name: Not on file   Number of children: 1   Years of education: Not on file   Highest education level: Not on file  Occupational History   Occupation: Retired    Fish farm manager: RETIRED    Comment: Investment banker, corporate  Tobacco Use   Smoking status: Never   Smokeless  tobacco: Never  Vaping Use   Vaping Use: Never used  Substance and Sexual Activity   Alcohol use: Yes    Comment: occassionally   Drug use: No   Sexual activity: Yes  Other Topics Concern   Not on file  Social History Narrative   Daily caffeine use- 3 drinks daily   Occupation: retired, 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   Activity: Patient does not get regular exercise, has gym membership   Diet: good water, daily fruits/vegetables   Social Determinants of Health   Financial Resource Strain: Not on file  Food Insecurity: Not on file  Transportation Needs: Not on file  Physical Activity: Not on file  Stress: Not on file  Social Connections: Not on file  Intimate Partner Violence: Not on file    Outpatient Medications Prior to Visit  Medication Sig Dispense Refill   amLODipine (NORVASC) 5 MG tablet Take 1 tablet (5 mg total) by mouth at bedtime. 90 tablet 3   Continuous Blood Gluc Receiver (FREESTYLE LIBRE 2 READER) DEVI Use as directed to check sugars daily E11.65 1 each 3   Continuous Blood Gluc Sensor (FREESTYLE LIBRE 14 DAY SENSOR) MISC 1 Device by Does not apply route as directed. 2 each 3   enalapril (VASOTEC) 20 MG tablet Take 1 tablet (20 mg total) by mouth 2 (two) times daily. 180 tablet 3   glipiZIDE (GLUCOTROL) 5 MG tablet Take 1 tablet (5 mg total) by mouth 2 (two) times daily before a meal. 180 tablet 3   glucose blood (FREESTYLE LITE) test strip USE TO CHECK SUGAR ONCE DAILY AND AS NEEDED. DX:E11.65 100 each 3   insulin glargine (LANTUS SOLOSTAR) 100 UNIT/ML Solostar Pen Inject 20 Units into the skin daily. 15 mL 4   Insulin Pen Needle (BD PEN NEEDLE MICRO U/F) 32G X 6 MM MISC Use as directed once daliy 100 each 3   Magnesium 250 MG TABS Take by mouth.     metFORMIN (GLUCOPHAGE-XR) 500 MG 24 hr tablet TAKE 1 (ONE) TABLET IN AM AND 2 (TWO) TABLETS IN PM WITH MEALS 270 tablet 3   MISC NATURAL PRODUCTS PO Take by  mouth. CBD supplement 0.5 mg by mouth daily 1500 mg PRN     OVER THE COUNTER MEDICATION CBD PM 1000  mg with Melatonin as needed for sleep     TURMERIC CURCUMIN PO Take 750 mg by mouth daily.     ZADITOR 0.025 % ophthalmic solution Apply to eye.     Calcium Carbonate-Vitamin D (CALCIUM-VITAMIN D3 PO) Take by mouth. 1200 mg of calcium; 1000 units of Vit D3 (Patient not taking: Reported on 10/21/2020)     Cholecalciferol (VITAMIN D3 PO) Take 5,000 Units by mouth daily. (Patient not taking: Reported on 10/21/2020)     No facility-administered medications prior to visit.    Allergies  Allergen Reactions   Avandia [Rosiglitazone] Other (See Comments)    arthralgias   Colesevelam Nausea Only and Other (See Comments)    Cramping, GI upset   Other Other (See Comments)    Steri Strips - blood blisters and skin irritation.  Mastasol glue - blood blisters and skin irritation.    Statins Other (See Comments)    crestor - arthralgias Pravastatin - depression RYR - arthralgias   Trulicity [Dulaglutide] Other (See Comments)    Stomach pain, bowel changes, malaise    Review of Systems  Constitutional:  Negative for chills and fever.  Respiratory:  Negative for shortness of breath.   Cardiovascular:  Negative for chest pain.  Gastrointestinal:  Positive for abdominal pain, diarrhea and nausea. Negative for blood in stool and vomiting.  Genitourinary:  Negative for dysuria, vaginal bleeding, vaginal discharge and vaginal pain.  Musculoskeletal:  Negative for back pain.  Neurological:  Negative for weakness.      Objective:    Physical Exam Vitals and nursing note reviewed.  Constitutional:      Appearance: She is well-developed.  Cardiovascular:     Rate and Rhythm: Normal rate and regular rhythm.  Pulmonary:     Effort: Pulmonary effort is normal.     Breath sounds: Normal breath sounds.  Abdominal:     General: A surgical scar is present. Bowel sounds are increased. There is no  distension.     Palpations: Abdomen is soft. There is no mass.     Tenderness: There is abdominal tenderness in the right lower quadrant and left lower quadrant. There is no right CVA tenderness, left CVA tenderness or rebound.    Musculoskeletal:     Comments: No tenderness to lumbar spine, lumbar paraspinal area. No tenderness to bilateral hips. Patellar reflex 1+ bilaterally Bilateral lower extremity strength 5/5  Skin:    General: Skin is warm.          Comments: Red line is scar from previous melanoma removal   Neurological:     Mental Status: She is alert.  Psychiatric:        Mood and Affect: Mood normal.        Behavior: Behavior normal.        Thought Content: Thought content normal.        Judgment: Judgment normal.    BP 138/82   Pulse 76   Temp 97.9 F (36.6 C)   Resp 12   Ht 5' 8.5" (1.74 m)   Wt 163 lb 2 oz (74 kg)   SpO2 95%   BMI 24.44 kg/m  Wt Readings from Last 3 Encounters:  10/21/20 163 lb 2 oz (74 kg)  09/03/20 166 lb 6 oz (75.5 kg)  03/08/20 171 lb (77.6 kg)    Health Maintenance Due  Topic Date Due   Zoster Vaccines- Shingrix (1 of 2) Never done   PNA vac Low Risk Adult (2 of 2 -  PPSV23) 09/21/2016   TETANUS/TDAP  05/10/2018   INFLUENZA VACCINE  09/09/2020   OPHTHALMOLOGY EXAM  10/10/2020    There are no preventive care reminders to display for this patient.   Lab Results  Component Value Date   TSH 0.44 08/30/2020   Lab Results  Component Value Date   WBC 6.1 08/30/2020   HGB 13.8 08/30/2020   HCT 41.3 08/30/2020   MCV 87.4 08/30/2020   PLT 199.0 08/30/2020   Lab Results  Component Value Date   NA 140 08/30/2020   K 4.2 08/30/2020   CO2 31 08/30/2020   GLUCOSE 169 (H) 08/30/2020   BUN 14 08/30/2020   CREATININE 0.67 08/30/2020   BILITOT 0.6 08/30/2020   ALKPHOS 51 08/30/2020   AST 13 08/30/2020   ALT 17 08/30/2020   PROT 6.6 08/30/2020   ALBUMIN 4.4 08/30/2020   CALCIUM 9.4 08/30/2020   GFR 88.42 08/30/2020   Lab  Results  Component Value Date   CHOL 187 08/30/2020   Lab Results  Component Value Date   HDL 45.50 08/30/2020   Lab Results  Component Value Date   LDLCALC 119 (H) 08/30/2020   Lab Results  Component Value Date   TRIG 113.0 08/30/2020   Lab Results  Component Value Date   CHOLHDL 4 08/30/2020   Lab Results  Component Value Date   HGBA1C 8.2 (H) 08/30/2020       Assessment & Plan:   Problem List Items Addressed This Visit       Other   Lower abdominal pain - Primary    Patient having bilateral lower abdominal pain.  Originally thought it was from a recent prescription of Colesevelam.  He had reached out via MyChart and was instructed to stop taking that medication.  Did look up adverse events for medication abdominal pain should not be one of them but GI upset is.  Did discuss possibility diverticulitis with patient we will pursue labs and CT scan.      Relevant Orders   CBC   Comprehensive metabolic panel   Lipase   POCT URINALYSIS DIP (CLINITEK) (Completed)   Diarrhea    States she did take half of an Imodium yesterday that helped minimally.  Has not taken one since.      Left lower quadrant abdominal pain    Patient been having symptoms for approximately 1 week's, settled over the past few days the lower abdomen.  No history of diverticulitis per patient report patient has had a colonoscopy in the past that did show diverticulosis.  Patient is tender in right lower and left lower quadrants, with diarrhea and some nausea.  Pending lab results we will pursue CT scan abdomen pelvis with and without contrast to rule out diverticulitis.      Relevant Orders   CT ABDOMEN PELVIS W WO CONTRAST     No orders of the defined types were placed in this encounter.  This visit occurred during the SARS-CoV-2 public health emergency.  Safety protocols were in place, including screening questions prior to the visit, additional usage of staff PPE, and extensive cleaning of  exam room while observing appropriate contact time as indicated for disinfecting solutions.   Romilda Garret, NP

## 2020-10-22 ENCOUNTER — Other Ambulatory Visit: Payer: Self-pay | Admitting: Nurse Practitioner

## 2020-10-22 ENCOUNTER — Ambulatory Visit
Admission: RE | Admit: 2020-10-22 | Discharge: 2020-10-22 | Disposition: A | Discharge status: Home / Self Care | Payer: PPO | Source: Ambulatory Visit | Attending: Nurse Practitioner | Admitting: Nurse Practitioner

## 2020-10-22 DIAGNOSIS — R1030 Lower abdominal pain, unspecified: Secondary | ICD-10-CM | POA: Diagnosis not present

## 2020-10-22 DIAGNOSIS — R197 Diarrhea, unspecified: Secondary | ICD-10-CM | POA: Diagnosis not present

## 2020-10-22 DIAGNOSIS — K5792 Diverticulitis of intestine, part unspecified, without perforation or abscess without bleeding: Secondary | ICD-10-CM

## 2020-10-22 DIAGNOSIS — R109 Unspecified abdominal pain: Secondary | ICD-10-CM | POA: Diagnosis not present

## 2020-10-22 IMAGING — CT CT ABD-PELV W/ CM
2 of 5 series · 15 of 46 positions shown, 17 images · IV contrast (omnipaque)
Comparison: None.

CLINICAL DATA: Left lower quadrant abdominal pain, diarrhea

EXAM:
CT ABDOMEN AND PELVIS WITH CONTRAST
TECHNIQUE: Multidetector CT imaging of the abdomen and pelvis was performed
using the standard protocol following bolus administration of
intravenous contrast.
CONTRAST:  75mL OMNIPAQUE IOHEXOL 350 MG/ML SOLN

[Series 2: axial st · axial · 0.79mm/px · z∈[-507,-57]mm · 12 of 103 slices shown, 14 images]
[im 7/103  soft-tissue]
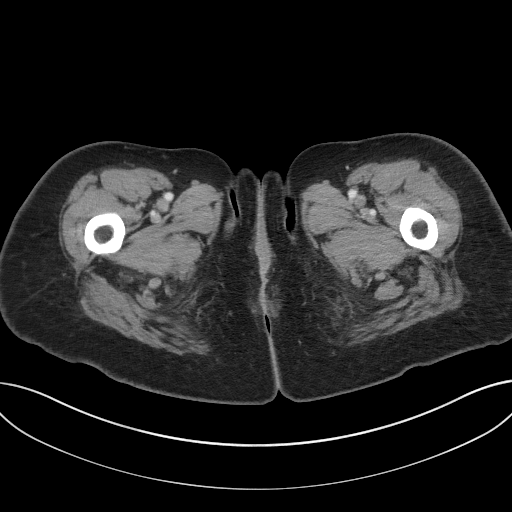
[im 7/103  bone]
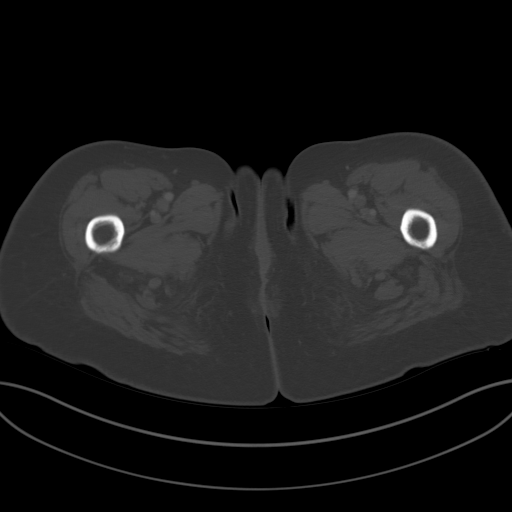
[im 19/103  soft-tissue]
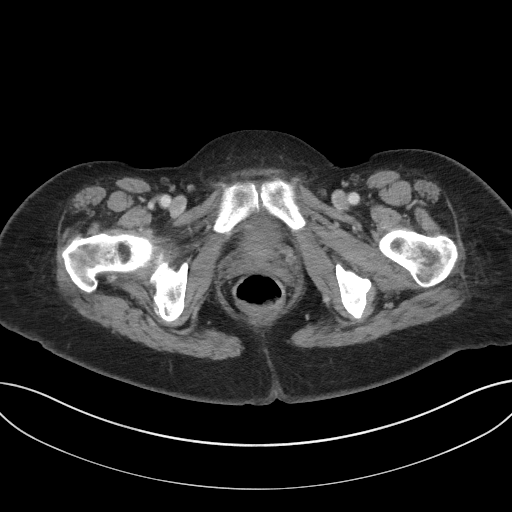
[im 25/103  soft-tissue]
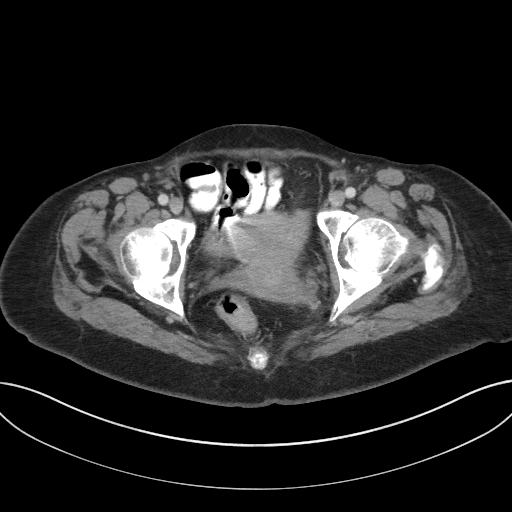
[im 31/103  soft-tissue]
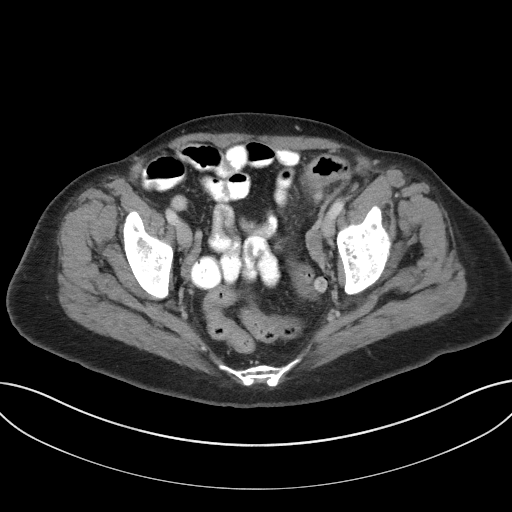
[im 43/103  soft-tissue]
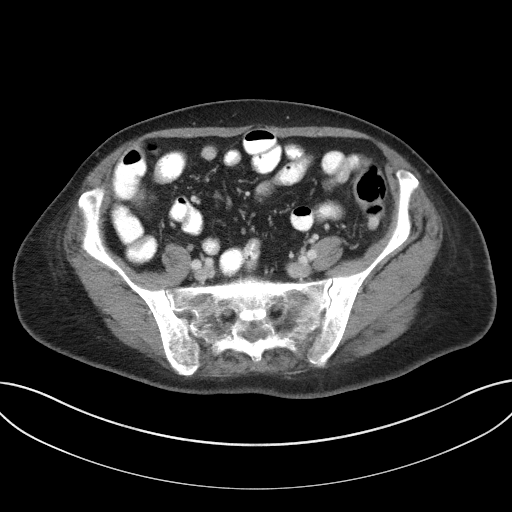
[im 49/103  soft-tissue]
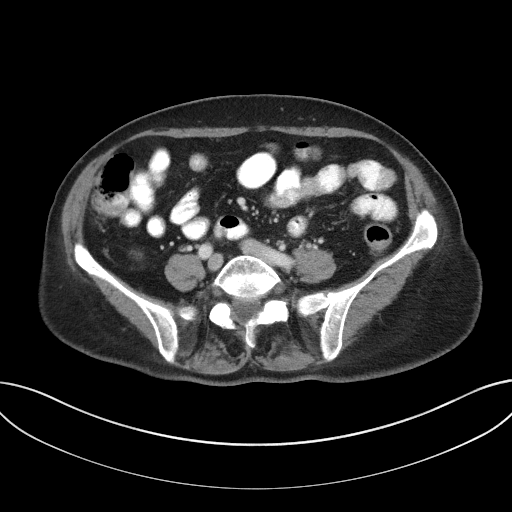
[im 55/103  soft-tissue]
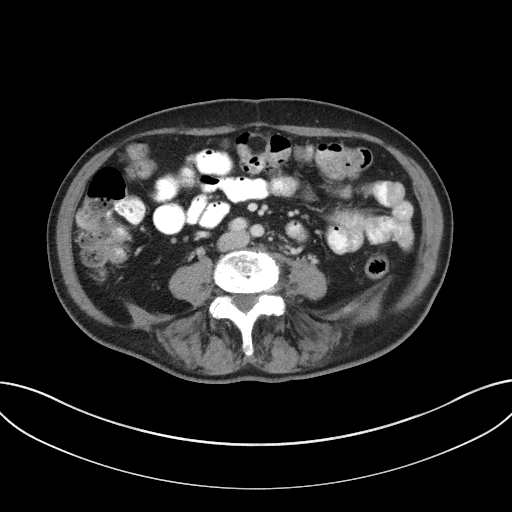
[im 67/103  soft-tissue]
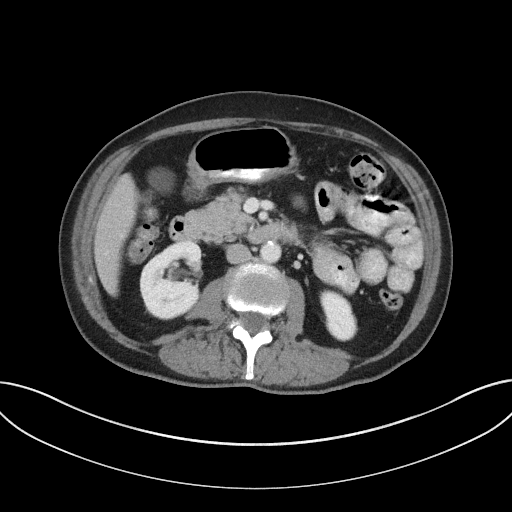
[im 73/103  soft-tissue]
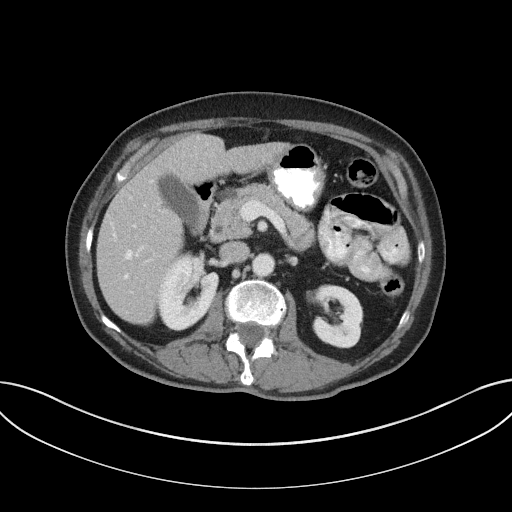
[im 73/103  bone]
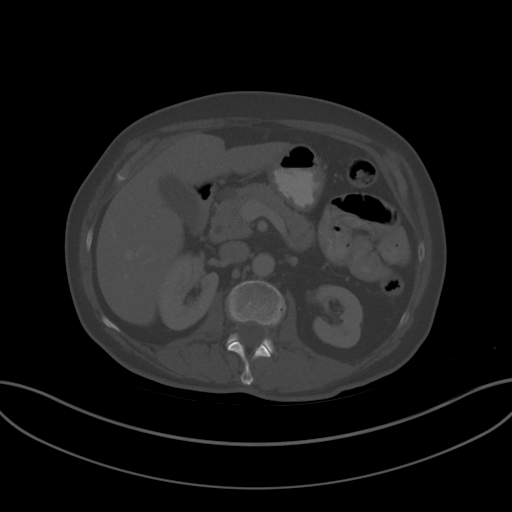
[im 79/103  soft-tissue]
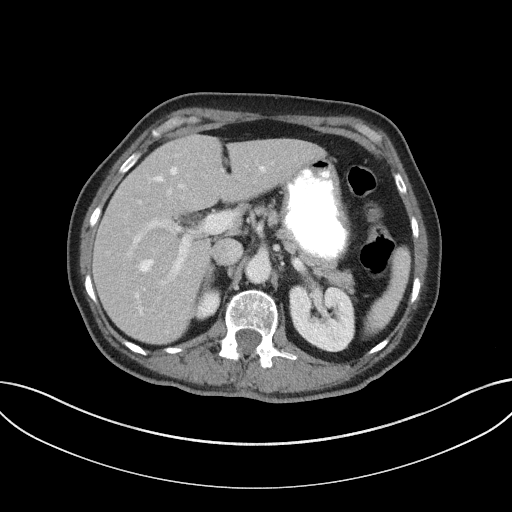
[im 91/103  soft-tissue]
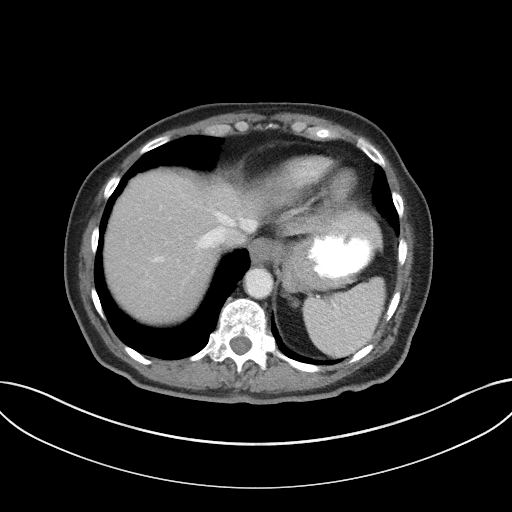
[im 97/103  soft-tissue]
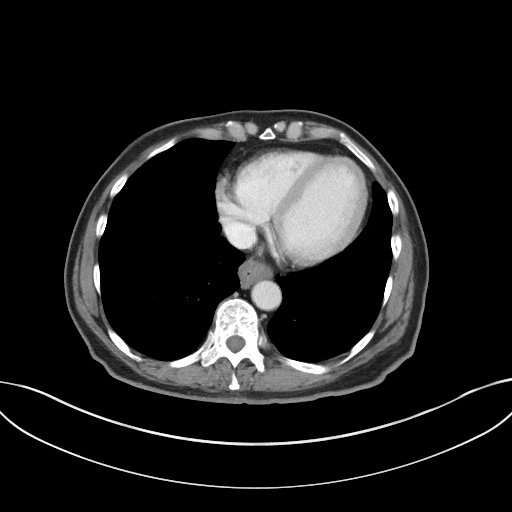

[Series 5: coronal st · coronal · 0.86mm/px · 3 of 101 slices shown]
[im 34/101  soft-tissue]
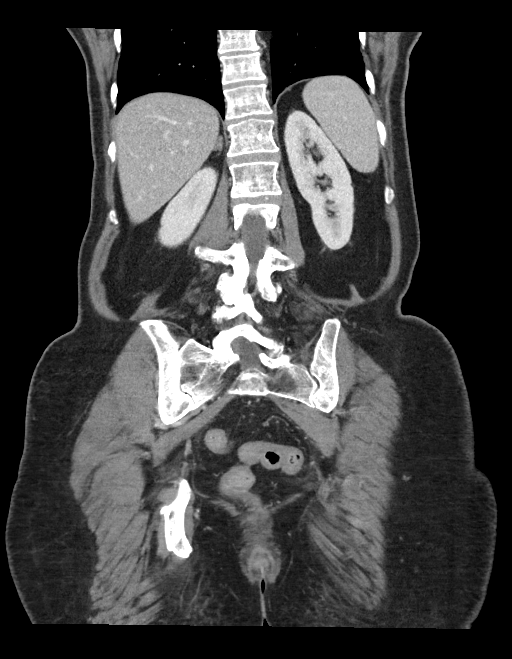
[im 45/101  soft-tissue]
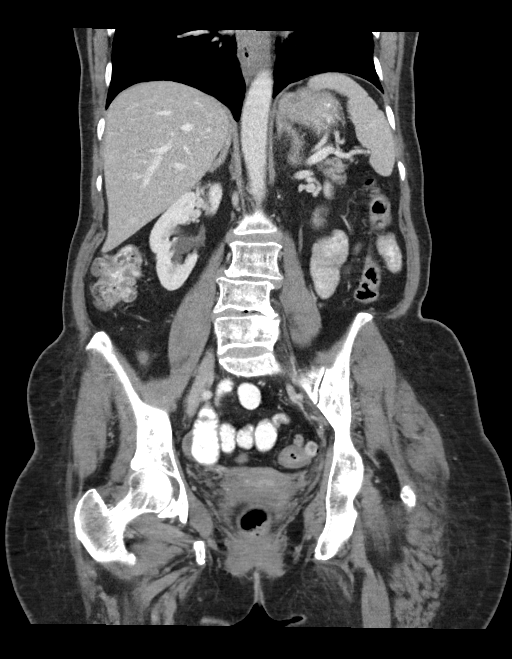
[im 56/101  soft-tissue]
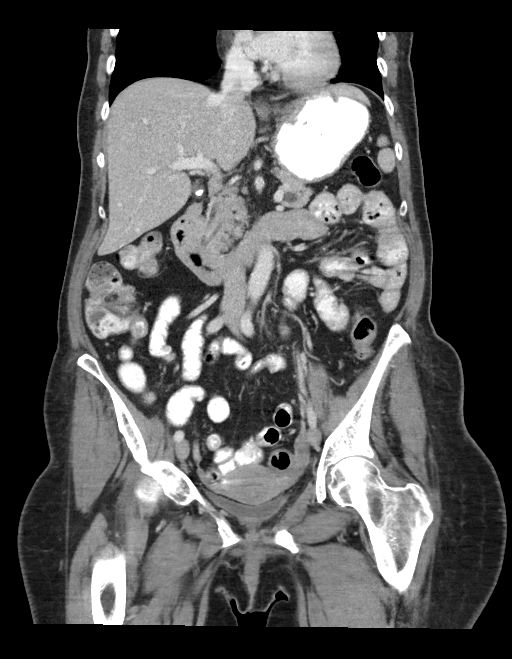

[15 of 46 positions shown; findings below may reference images not displayed]

FINDINGS: Lower chest: Visualized lung bases are clear bilaterally. The
visualized heart and pericardium are unremarkable. There is
circumferential thickening of the distal esophagus which may reflect
changes of esophagitis.

Hepatobiliary: Cholelithiasis without pericholecystic inflammatory
change. Liver unremarkable. No intra or extrahepatic biliary ductal
dilation.

Pancreas: There are at least 5 scattered low-attenuation lesions
noted throughout the pancreas, the largest of which is seen within
the pancreatic neck measuring 15 mm x 21 mm and is of water
attenuation. These are not well characterized on this single-phase
examination and may represent pancreatic cysts, dilated pancreatic
side branch ducts, or small cystic neoplasms. The main pancreatic
duct is not dilated. No peripancreatic adenopathy or inflammatory
stranding is identified. There is no significant atrophy of the
pancreatic parenchyma noted.

Spleen: Unremarkable

Adrenals/Urinary Tract: The adrenal glands are unremarkable. The
kidneys are normal in size and position. Small peripelvic cyst noted
within the lower pole the right kidney. The kidneys are otherwise
unremarkable. The bladder is unremarkable.

Stomach/Bowel: There is moderate sigmoid diverticulosis. There is
superimposed focal inflammatory change surrounding a single
diverticulum involving the proximal sigmoid colon best seen on axial
image # 74 and sagittal reformat # 78 compatible with changes of
acute, uncomplicated sigmoid diverticulitis. There is no evidence of
obstruction. There is no free intraperitoneal gas. No free or
loculated intraperitoneal fluid. The stomach, small bowel, and large
bowel are otherwise unremarkable. Appendix normal.

Vascular/Lymphatic: Mild aortoiliac atherosclerotic calcification.
No aortic aneurysm. No pathologic adenopathy within the abdomen and
pelvis.

Reproductive: Uterus and bilateral adnexa are unremarkable.

Other: No abdominal wall hernia.  The rectum is unremarkable.

Musculoskeletal: Degenerative changes are seen within the lumbar
spine. Vertebral hemangioma noted within the L2 vertebral body. No
suspicious lytic or blastic bone lesion. No acute bone abnormality.
IMPRESSION: Mild, acute, uncomplicated sigmoid diverticulitis. No evidence of
obstruction or perforation. Background moderate sigmoid
diverticulosis.

Cholelithiasis.

Multiple cystic lesions identified throughout the pancreas measuring
up to 21 mm within the pancreatic neck. Differential considerations
are as listed above. Comparison with prior examinations would be
helpful in determining chronicity. If none are available, recommend
follow up pre and post contrast MRI/MRCP or pancreatic protocol CT
in 2 years. This recommendation follows ACR consensus guidelines:
Management of Incidental Pancreatic Cysts: A White Paper of the ACR
Incidental Findings Committee. [HOSPITAL] [4P];[DATE].

Aortic Atherosclerosis ([4P]-[4P]).

## 2020-10-22 MED ORDER — AMOXICILLIN-POT CLAVULANATE 875-125 MG PO TABS: 1.0000 | ORAL_TABLET | Freq: Two times a day (BID) | ORAL | 0 refills | Status: DC

## 2020-10-22 MED ORDER — IOHEXOL 350 MG/ML SOLN
75.0000 mL | Freq: Once | INTRAVENOUS | Status: AC | PRN
  Administered 2020-10-22: 75 mL via INTRAVENOUS

## 2020-10-22 NOTE — Progress Notes (Signed)
Called patient and briefly discussed her CT results. Did show diverticulitis and other incidental findings. We will reach out to schedule a follow up appointment. Her lipase was elevated but consider gallstones and pancreatic mass.

## 2020-10-23 ENCOUNTER — Telehealth: Payer: Self-pay

## 2020-10-23 NOTE — Telephone Encounter (Signed)
Called pt to schedule f/u appt as requested per pt care team. Pt agreed to be seen as below:  Appointment Information  Name: Elizabeth Curtis, Husby MRN: [id number]  Date: 11/26/2020 Status: Sch  Time: 9:30 AM Length: 20  Visit Type: FOLLOW UP 20 [336] Copay: $30.00  Provider: Thornton Park, MD Department: [facility]  Referring Provider: Ria Bush CSN: [id number]  Notes: f/u diverticulitis s/p treatment per Dr. Titus Dubin  Made On: 10/23/2020 3:06 PM By: Hardie Pulley, Aaryn Sermon J

## 2020-10-23 NOTE — Telephone Encounter (Signed)
-----   Message from Thornton Park, MD sent at 10/23/2020  2:06 PM EDT ----- Regarding: RE: incidental imaging finding Thanks for the follow-up and for reaching out.  I will work to get her an appointment to be sure that her symptoms improve with treatment of diverticulitis and aren't related to her pancreatic findings.   Jayleene Glaeser, Please schedule office follow-up with me or Colleen - next available.  Thanks.  KLB ----- Message ----- From: Ria Bush, MD Sent: 10/23/2020   7:51 AM EDT To: Michela Pitcher, NP, Thornton Park, MD Subject: incidental imaging finding                     Hey Dr Tarri Glenn,  [facility] you're well.  I wanted to run this patient by you regarding incidental finding on imaging for diverticulitis - CT scan shows at least 5 spots on pancreas ?dilated ducts vs cysts vs small neoplasms. Radiology recommends rpt CT or MRI/MRCP in 2 years.  She has h/o abdominal wall melanoma s/p excision 2012.  Do you think rpt 2 yrs is appropriate or should I refer to GI for further evaluation?  Thanks for your time! -Garlon Hatchet

## 2020-10-24 ENCOUNTER — Telehealth: Payer: Self-pay | Admitting: Family Medicine

## 2020-10-24 NOTE — Telephone Encounter (Signed)
I scheduled pt a f/u apt with james cable on 9.22.22 @ 8;40 am

## 2020-10-25 NOTE — Telephone Encounter (Signed)
Noted  

## 2020-10-31 ENCOUNTER — Other Ambulatory Visit: Payer: Self-pay

## 2020-10-31 ENCOUNTER — Ambulatory Visit (INDEPENDENT_AMBULATORY_CARE_PROVIDER_SITE_OTHER): Payer: PPO | Admitting: Nurse Practitioner

## 2020-10-31 VITALS — BP 124/76 | HR 73 | Temp 97.5°F | Resp 10 | Ht 68.5 in | Wt 166.4 lb

## 2020-10-31 DIAGNOSIS — K862 Cyst of pancreas: Secondary | ICD-10-CM | POA: Insufficient documentation

## 2020-10-31 DIAGNOSIS — R748 Abnormal levels of other serum enzymes: Secondary | ICD-10-CM

## 2020-10-31 DIAGNOSIS — K5732 Diverticulitis of large intestine without perforation or abscess without bleeding: Secondary | ICD-10-CM | POA: Diagnosis not present

## 2020-10-31 NOTE — Assessment & Plan Note (Signed)
Patient was concerned that she had elevated lipase and was wondering if would recheck in office today.  Told patient she is not having any symptoms she has a follow-up in 2 to 2-1/2 weeks with GI will defer.

## 2020-10-31 NOTE — Progress Notes (Signed)
Established Patient Office Visit  Subjective:  Patient ID: Elizabeth Curtis, female    DOB: Sep 05, 1950  Age: 70 y.o. MRN: [id number]  CC:  Chief Complaint  Patient presents with   Abdominal Pain    Follow up. Doing better.    HPI Elizabeth Curtis presents for follow up on diverticulitis.  Patient states pain is improved.  She has been eating semi modified diet.  States she is avoiding certain grains.  Did notice 1 day she had some beings and had fluttering/twinge of pain in the left lower quadrant that self resolved.  States diarrhea is resolving over the past 2 days with use of activity yogurt.  Past Medical History:  Diagnosis Date   Allergy    Arthritis    OA   Blood transfusion without reported diagnosis    with C Section    Cataract 2020   removed both eyes    Chronic low back pain    Diabetes mellitus    Diffuse cystic mastopathy    Disturbance of skin sensation    Diverticulosis of colon (without mention of hemorrhage)    Dysphagia, unspecified(787.20)    Essential hypertension, benign 1995   H/O cystitis 2011   History of colonic polyps    Hyperplastic   History of pelvic ultrasound 03/05   Uterus 13 X 7 X 8cm, Fibroid 2.6cm   HLD (hyperlipidemia)    borderline    Lipoma    Mammographic microcalcification 2013   MDD (major depressive disorder) 11/22/2008   Melanoma ([place])    upper abdomin left SN removed under axilla   Osteopenia 03/2015   DEXA hip -1.7, spine -1.7   Plantar wart    Schatzki's ring 07/23/08   EGD dilated O/W normal (Dr. 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   Vision abnormalities     Past Surgical History:  Procedure Laterality Date   BREAST BIOPSY Right 07/2011   rec rpt 6 mo, neg   BREAST BIOPSY Right 06/07/2019   [place] core  coil clip - fibrocystic change with apocrine metaplasia   [place]   Breech presentation   COLONOSCOPY  2010   [place], rec rpt 10 yrs   COLONOSCOPY   02/2020   multiple polyps, hem, diverticulosis, lipoma, rpt 3 yrs ([place])   ESOPHAGOGASTRODUODENOSCOPY  2010   [place], dilated schatzki ring   EYE SURGERY Left 2012   cataract   INTRAOCULAR LENS EXCHANGE Left 05/2017   uveitis-glaucoma-hypheme syndrome ([place])   KNEE ARTHROSCOPY W/ MENISCAL REPAIR Left 01/2014   [facility]   MELANOMA EXCISION  2012   upper abdomen   POLYPECTOMY     2008 TA, 2010 HPP    UPPER GASTROINTESTINAL ENDOSCOPY      Family History  Problem Relation Age of Onset   Diabetes Mother    Hyperlipidemia Mother    Hypertension Mother    Mental illness Mother        Personality disorder   Kidney disease Father        failure, (Diaylsis)   Diabetes Father    Diabetes Brother    Coronary artery disease Brother 55       CAD, hospitalized freq, PTCA   Gout Brother    Hypertension Brother    Cancer Maternal Aunt 62       breast   Breast cancer Maternal Aunt 46   Cancer Paternal Aunt 70       breast  Breast cancer Paternal Aunt 76   ALS Paternal Aunt    Stroke Paternal Grandfather    Stroke Maternal Grandmother    Breast cancer Maternal Aunt 70   Colon cancer Neg Hx    Colon polyps Neg Hx    Esophageal cancer Neg Hx    Rectal cancer Neg Hx    Stomach cancer Neg Hx     Social History   Socioeconomic History   Marital status: Married    Spouse name: Not on file   Number of children: 1   Years of education: Not on file   Highest education level: Not on file  Occupational History   Occupation: Retired    Fish farm manager: RETIRED    Comment: Investment banker, corporate  Tobacco Use   Smoking status: Never   Smokeless tobacco: Never  Vaping Use   Vaping Use: Never used  Substance and Sexual Activity   Alcohol use: Yes    Comment: occassionally   Drug use: No   Sexual activity: Yes  Other Topics Concern   Not on file  Social History Narrative   Daily caffeine use- 3 drinks daily   Occupation: retired, 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   Activity: Patient does not get regular exercise, has gym membership   Diet: good water, daily fruits/vegetables   Social Determinants of Health   Financial Resource Strain: Not on file  Food Insecurity: Not on file  Transportation Needs: Not on file  Physical Activity: Not on file  Stress: Not on file  Social Connections: Not on file  Intimate Partner Violence: Not on file    Outpatient Medications Prior to Visit  Medication Sig Dispense Refill   amLODipine (NORVASC) 5 MG tablet Take 1 tablet (5 mg total) by mouth at bedtime. 90 tablet 3   amoxicillin-clavulanate (AUGMENTIN) 875-125 MG tablet Take 1 tablet by mouth 2 (two) times daily. 20 tablet 0   Calcium Carbonate-Vitamin D (CALCIUM-VITAMIN D3 PO) Take by mouth. 1200 mg of calcium; 1000 units of Vit D3     Cholecalciferol (VITAMIN D3 PO) Take 5,000 Units by mouth daily.     Continuous Blood Gluc Receiver (FREESTYLE LIBRE 2 READER) DEVI Use as directed to check sugars daily E11.65 1 each 3   Continuous Blood Gluc Sensor (FREESTYLE LIBRE 14 DAY SENSOR) MISC 1 Device by Does not apply route as directed. 2 each 3   enalapril (VASOTEC) 20 MG tablet Take 1 tablet (20 mg total) by mouth 2 (two) times daily. 180 tablet 3   glipiZIDE (GLUCOTROL) 5 MG tablet Take 1 tablet (5 mg total) by mouth 2 (two) times daily before a meal. 180 tablet 3   glucose blood (FREESTYLE LITE) test strip USE TO CHECK SUGAR ONCE DAILY AND AS NEEDED. DX:E11.65 100 each 3   insulin glargine (LANTUS SOLOSTAR) 100 UNIT/ML Solostar Pen Inject 20 Units into the skin daily. 15 mL 4   Insulin Pen Needle (BD PEN NEEDLE MICRO U/F) 32G X 6 MM MISC Use as directed once daliy 100 each 3   Magnesium 250 MG TABS Take by mouth.     metFORMIN (GLUCOPHAGE-XR) 500 MG 24 hr tablet TAKE 1 (ONE) TABLET IN AM AND 2 (TWO) TABLETS IN PM WITH MEALS 270 tablet 3   MISC NATURAL PRODUCTS PO Take by mouth. CBD supplement 0.5 mg by mouth daily 1500 mg PRN      OVER THE COUNTER MEDICATION CBD PM 1000 mg with Melatonin as  needed for sleep     TURMERIC CURCUMIN PO Take 750 mg by mouth daily.     ZADITOR 0.025 % ophthalmic solution Apply to eye.     No facility-administered medications prior to visit.    Allergies  Allergen Reactions   Avandia [Rosiglitazone] Other (See Comments)    arthralgias   Colesevelam Nausea Only and Other (See Comments)    Cramping, GI upset   Other Other (See Comments)    Steri Strips - blood blisters and skin irritation.  Mastasol glue - blood blisters and skin irritation.    Statins Other (See Comments)    crestor - arthralgias Pravastatin - depression RYR - arthralgias   Trulicity [Dulaglutide] Other (See Comments)    Stomach pain, bowel changes, malaise    ROS Review of Systems  Constitutional:  Negative for chills and fever.  Respiratory:  Negative for shortness of breath.   Cardiovascular:  Negative for chest pain.  Gastrointestinal:  Positive for diarrhea ([place] none over the past 2 days). Negative for abdominal distention, abdominal pain, nausea and vomiting.  Skin:  Negative for color change and pallor.     Objective:    Physical Exam Vitals and nursing note reviewed.  Constitutional:      Appearance: Normal appearance.  Cardiovascular:     Rate and Rhythm: Normal rate and regular rhythm.  Pulmonary:     Effort: Pulmonary effort is normal.  Abdominal:     General: Abdomen is flat. A surgical scar is present. Bowel sounds are normal.     Palpations: Abdomen is soft. There is no mass.     Tenderness: There is no abdominal tenderness.  Skin:    General: Skin is warm.  Neurological:     Mental Status: She is alert.  Psychiatric:        Mood and Affect: Mood normal.        Behavior: Behavior normal.        Thought Content: Thought content normal.        Judgment: Judgment normal.    BP 124/76   Pulse 73   Temp (!) 97.5 F (36.4 C)   Resp 10   Ht 5' 8.5" (1.74 m)   Wt 166 lb 6  oz (75.5 kg)   SpO2 96%   BMI 24.93 kg/m  Wt Readings from Last 3 Encounters:  10/31/20 166 lb 6 oz (75.5 kg)  10/21/20 163 lb 2 oz (74 kg)  09/03/20 166 lb 6 oz (75.5 kg)     Health Maintenance Due  Topic Date Due   Zoster Vaccines- Shingrix (1 of 2) Never done   TETANUS/TDAP  05/10/2018   OPHTHALMOLOGY EXAM  10/10/2020    There are no preventive care reminders to display for this patient.  Lab Results  Component Value Date   TSH 0.44 08/30/2020   Lab Results  Component Value Date   WBC 7.8 10/21/2020   HGB 13.1 10/21/2020   HCT 39.0 10/21/2020   MCV 86.5 10/21/2020   PLT 256.0 10/21/2020   Lab Results  Component Value Date   NA 139 10/21/2020   K 4.1 10/21/2020   CO2 30 10/21/2020   GLUCOSE 119 (H) 10/21/2020   BUN 18 10/21/2020   CREATININE 0.68 10/21/2020   BILITOT 0.3 10/21/2020   ALKPHOS 52 10/21/2020   AST 10 10/21/2020   ALT 10 10/21/2020   PROT 7.1 10/21/2020   ALBUMIN 4.1 10/21/2020   CALCIUM 9.8 10/21/2020   GFR 88.02 10/21/2020  Lab Results  Component Value Date   CHOL 187 08/30/2020   Lab Results  Component Value Date   HDL 45.50 08/30/2020   Lab Results  Component Value Date   LDLCALC 119 (H) 08/30/2020   Lab Results  Component Value Date   TRIG 113.0 08/30/2020   Lab Results  Component Value Date   CHOLHDL 4 08/30/2020   Lab Results  Component Value Date   HGBA1C 8.2 (H) 08/30/2020      Assessment & Plan:   Problem List Items Addressed This Visit       Digestive   Diverticulitis large intestine w/o perforation or abscess w/o bleeding    We reviewed CT scan with patient in office.  Answered questions she had.  She has 2 doses of medication left encourage patient to finish full course.  She tolerated it fairly well stated that she did have episodes of diarrhea she was able to eat activity yogurt and over the past 2 days diarrhea seems to be improving/resolving.  Exam stable in office today no longer having lower abdominal  pain at rest or with palpation.  Finish Augmentin as prescribed.        Other   Elevated lipase - Primary    Patient was concerned that she had elevated lipase and was wondering if would recheck in office today.  Told patient she is not having any symptoms she has a follow-up in 2 to 2-1/2 weeks with GI will defer.       No orders of the defined types were placed in this encounter.   Follow-up: Return As scheduled.   This visit occurred during the SARS-CoV-2 public health emergency.  Safety protocols were in place, including screening questions prior to the visit, additional usage of staff PPE, and extensive cleaning of exam room while observing appropriate contact time as indicated for disinfecting solutions.   Romilda Garret, NP

## 2020-10-31 NOTE — Assessment & Plan Note (Signed)
We reviewed CT scan with patient in office.  Answered questions she had.  She has 2 doses of medication left encourage patient to finish full course.  She tolerated it fairly well stated that she did have episodes of diarrhea she was able to eat activity yogurt and over the past 2 days diarrhea seems to be improving/resolving.  Exam stable in office today no longer having lower abdominal pain at rest or with palpation.  Finish Augmentin as prescribed.

## 2020-10-31 NOTE — Patient Instructions (Signed)
Nice to see you today Glad that your symptoms are resolving Keep the appointment with Dr. Tarri Glenn. Follow up if you need anything in the interim

## 2020-11-05 ENCOUNTER — Other Ambulatory Visit: Payer: Self-pay | Admitting: Family Medicine

## 2020-11-23 ENCOUNTER — Encounter: Payer: Self-pay | Admitting: Family Medicine

## 2020-11-26 ENCOUNTER — Other Ambulatory Visit (INDEPENDENT_AMBULATORY_CARE_PROVIDER_SITE_OTHER): Payer: PPO

## 2020-11-26 ENCOUNTER — Ambulatory Visit: Payer: PPO | Admitting: Gastroenterology

## 2020-11-26 ENCOUNTER — Encounter: Payer: Self-pay | Admitting: Gastroenterology

## 2020-11-26 VITALS — BP 140/80 | HR 85 | Ht 68.5 in | Wt 167.5 lb

## 2020-11-26 DIAGNOSIS — K5732 Diverticulitis of large intestine without perforation or abscess without bleeding: Secondary | ICD-10-CM

## 2020-11-26 DIAGNOSIS — R748 Abnormal levels of other serum enzymes: Secondary | ICD-10-CM

## 2020-11-26 DIAGNOSIS — R933 Abnormal findings on diagnostic imaging of other parts of digestive tract: Secondary | ICD-10-CM

## 2020-11-26 LAB — LIPASE: Lipase: 55 U/L (ref 11.0–59.0)

## 2020-11-26 NOTE — Patient Instructions (Addendum)
It was my pleasure to provide care to you today. Based on our discussion, I am providing you with my recommendations below:  RECOMMENDATION(S):   IMAGING:  You will be contacted by [facility] Scheduling (Your caller ID will indicate phone # [phone]) within the next business 7-10 business days to schedule your MRI/MRCP. If you have not heard from them within 7-10 business days, please call [facility] at [phone] to follow up on the status of your appointment.    LABS:   Please proceed to the basement level for lab work before leaving today. Press "B" on the elevator. The lab is located at the first door on the left as you exit the elevator.  HEALTHCARE LAWS AND MY CHART RESULTS:   Due to recent changes in healthcare laws, you may see results of your imaging and/or laboratory studies on MyChart before I have had a chance to review them.  I understand that in some cases there may be results that are confusing or concerning to you. Please understand that not all results are received at same time and often I may need to interpret multiple results in order to provide you with the best plan of care or course of treatment. Therefore, I ask that you please give me 48 hours to thoroughly review all your results before contacting my office for clarification.   FOLLOW UP:  I would like for you to follow up with me after you have completed your MRI. Please call the office at [phone] to schedule your appointment.  BMI:  If you are age 28 or older, your body mass index should be between 23-30. Your Body mass index is 25.1 kg/m. If this is out of the aforementioned range listed, please consider follow up with your Primary Care Provider.  MY CHART:  The [facility] providers would like to encourage you to use [facility] to communicate with providers for non-urgent requests or questions.  Due to long hold times on the telephone, sending your provider a message by  [facility] may be a faster and more efficient way to get a response.  Please allow 48 business hours for a response.  Please remember that this is for non-urgent requests.   Thank you for trusting me with your gastrointestinal care!    Thornton Park, MD, MPH

## 2020-11-26 NOTE — Progress Notes (Signed)
Following message sent to Rhys Martini and April Pait:  [facility] Phone: [phone] Fax: [phone]   Imaging Ordered: MRI/MRCP  Diagnosis: abnormal pancreas on CT, elevated lipase, recent diverticulitis  Ordering Provider: Dr. Tarri Glenn  Is a Prior Authorization needed? We are in the process of obtaining it now  Is the patient Diabetic? Yes  Does the patient have Hypertension? Yes  Does the patient have any implanted devices or hardware? No  Date of last BUN/Creat, if needed? 10/21/20  Patient Weight? 167#  Is the patient able to get on the table? Yes  Has the patient been diagnosed with COVID? No  Is the patient waiting on COVID testing results? No  Thank you for your assistance! [facility]

## 2020-11-26 NOTE — Progress Notes (Signed)
Referring Provider: Ria Bush, MD Primary Care Physician:  Ria Bush, MD  Reason for Consultation:  Diverticulitis, abnormal pancreas on CT   IMPRESSION:  Elevated lipase without associated symptoms Abnormal pancreas on CT without prior imaging for comparison Recent diverticulitis successfully treated with antibiotics   PLAN: - Avoid NSAIDs given the history of diverticulitis - High fiber diet, at least 642 ounces of water, add daily psyllium or methylcellulose - Lipase to determine if elevation persists - MRI/MRCP with and without contrast to further characterize her CT findings - Follow-up in the office after MRI  HPI: Elizabeth Curtis is a 70 y.o. female presents to the office for her first appointment with me. We originally met at the time of her colonoscopy. Her husband accompanies her to this appointment.  Diagnosed with sigmoid diverticulitis by CT 10/22/20 when she presented with lower abdominal pain. Symptoms improved with antibiotics.  However, the CT scan showed  Multiple cystic lesions identified throughout the pancreas measuring up to 21 mm within the pancreatic neck.   No prior abdominal imaging available for comparison. No history of pancreatitis or pancreatic imaging.   She has had diabetes for 20 years. She has been on insulin for 2 years on relatively stable insulin.  Her last hemoglobin A1C was 8.1.  Uses CBD oil with melatonin for sleep.   Strong family history of diabetes. No known family history of pancreatitis or pancreatic cancer.   Colonoscopy 03/08/20: - Hemorrhoids found on perianal exam. - Non-bleeding internal hemorrhoids. - Diverticulosis in the sigmoid colon. - Two 2 to 6 mm polyps in the rectum, removed with a cold snare. Resected and retrieved. - One 3 mm polyp in the proximal descending colon, removed with a cold biopsy forceps. Resected and retrieved. - One 3 mm polyp in the proximal transverse colon, removed with a cold  snare. Resected and retrieved. - Large lipoma in the mid transverse colon. - The examination was otherwise normal on direct and retroflexion views.   Past Medical History:  Diagnosis Date   Allergy    Arthritis    OA   Blood transfusion without reported diagnosis    with C Section    Cataract 2020   removed both eyes    Chronic low back pain    Diabetes mellitus    Diffuse cystic mastopathy    Disturbance of skin sensation    Diverticulosis of colon (without mention of hemorrhage)    Dysphagia, unspecified(787.20)    Essential hypertension, benign 1995   H/O cystitis 2011   History of colonic polyps    Hyperplastic   History of pelvic ultrasound 03/05   Uterus 13 X 7 X 8cm, Fibroid 2.6cm   HLD (hyperlipidemia)    borderline    Lipoma    Mammographic microcalcification 2013   MDD (major depressive disorder) 11/22/2008   Melanoma ([place])    upper abdomin left SN removed under axilla   Osteopenia 03/2015   DEXA hip -1.7, spine -1.7   Plantar wart    Schatzki's ring 07/23/08   EGD dilated O/W normal (Dr. 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 90 tablet 3   BD PEN NEEDLE NANO 2ND GEN 32G X 4 MM MISC USE AS DIRECTED ONCE DAILY 100 each 3   Calcium Carbonate-Vitamin D (CALCIUM-VITAMIN D3 PO) Take by mouth. 1200 mg of calcium; 1000 units of Vit D3     Continuous Blood Gluc Receiver (FREESTYLE LIBRE 2 READER) DEVI Use as directed to check sugars daily E11.65 1 each 3   Continuous Blood Gluc Sensor (FREESTYLE LIBRE 14 DAY SENSOR) MISC 1 Device by Does not apply route as directed. 2 each 3   enalapril (VASOTEC) 20 MG tablet Take 1 tablet (20 mg total) by mouth 2 (two) times daily. 180 tablet 3   glipiZIDE (GLUCOTROL) 5 MG tablet Take 1 tablet (5 mg total) by mouth 2 (two) times daily before a meal. 180 tablet 3   glucose blood (FREESTYLE LITE) test strip USE TO CHECK SUGAR ONCE DAILY AND AS NEEDED. DX:E11.65 100 each 3   insulin glargine (LANTUS SOLOSTAR) 100 UNIT/ML Solostar Pen Inject 20 Units into the skin daily. 15 mL 4   Magnesium 250 MG TABS Take by mouth.     metFORMIN (GLUCOPHAGE-XR) 500 MG 24 hr tablet TAKE 1 (ONE) TABLET IN AM AND 2 (TWO) TABLETS IN PM WITH MEALS 270 tablet 3   MISC NATURAL PRODUCTS PO Take by mouth. CBD supplement 0.5 mg by mouth daily 1500 mg PRN     OVER THE COUNTER MEDICATION CBD PM 1000 mg with Melatonin as needed for sleep     TURMERIC CURCUMIN PO Take 750 mg by mouth daily.     ZADITOR 0.025 % ophthalmic solution Apply to eye.     Cholecalciferol (VITAMIN D3 PO) Take 5,000 Units by mouth daily. (Patient not taking: Reported on 11/26/2020)     No current facility-administered medications for this visit.    Allergies as of 11/26/2020 - Review Complete 10/22/2020  Allergen Reaction Noted   Avandia [rosiglitazone] Other (See Comments) 09/23/2012   Colesevelam Nausea Only and Other (See Comments) 10/18/2020   Other Other (See Comments) 09/22/2012    Statins Other (See Comments) 26/20/3559   Trulicity [dulaglutide] Other (See Comments) 10/12/2016    Family History  Problem Relation Age of Onset   Diabetes Mother    Hyperlipidemia Mother    Hypertension Mother    Mental illness Mother        Personality disorder   Kidney disease Father        failure, (Diaylsis)   Diabetes Father    Diabetes Brother    Coronary artery disease Brother 68       CAD, hospitalized freq, PTCA   Gout Brother    Hypertension Brother    Cancer Maternal Aunt 62       breast   Breast  cancer Maternal Aunt 70   Cancer Paternal Aunt 79       breast   Breast cancer Paternal Aunt 9   ALS Paternal Aunt    Stroke Paternal Grandfather    Stroke Maternal Grandmother    Breast cancer Maternal Aunt 70   Colon cancer Neg Hx    Colon polyps Neg Hx    Esophageal cancer Neg Hx    Rectal cancer Neg Hx    Stomach cancer Neg Hx       Physical Exam: General:   Alert,  well-nourished, pleasant and cooperative in NAD Head:  Normocephalic and atraumatic. Eyes:  Sclera clear, no icterus.   Conjunctiva pink. Ears:  Normal auditory acuity. Nose:  No deformity, discharge,  or lesions. Mouth:  No deformity or lesions.   Neck:  Supple; no masses or thyromegaly. Lungs:  Clear throughout to auscultation.   No wheezes. Heart:  Regular rate and rhythm; no murmurs. Abdomen:  Soft, nontender, nondistended, normal bowel sounds, no rebound or guarding. No hepatosplenomegaly.   Rectal:  Deferred  Msk:  Symmetrical. No boney deformities LAD: No inguinal or umbilical LAD Extremities:  No clubbing or edema. Neurologic:  Alert and  oriented x4;  grossly nonfocal Skin:  Intact without significant lesions or rashes. Psych:  Alert and cooperative. Normal mood and affect.     Vassie Kugel L. Tarri Glenn, MD, MPH 12/01/2020, 6:25 PM

## 2020-12-01 ENCOUNTER — Encounter: Payer: Self-pay | Admitting: Gastroenterology

## 2020-12-03 NOTE — Progress Notes (Signed)
Appointment Information  Name: Elizabeth Curtis, Elizabeth Curtis MRN: [id number]  Date: 12/09/2020 Status: Sch  Time: 8:00 AM Length: 60  Visit Type: MR ABD W/WO CM/MRCP [[id number]] Copay: $0.00  Provider: [facility]MR 2 Department: [facility]  Referring Provider: Thornton Park CSN: [id number]  Notes: S/w pt arrive @ 730am , NPO 4hrs prior.Marland KitchenLc   Made On: 11/28/2020 11:44 AM By: Maryann Conners

## 2020-12-09 ENCOUNTER — Ambulatory Visit
Admission: RE | Admit: 2020-12-09 | Discharge: 2020-12-09 | Disposition: A | Discharge status: Home / Self Care | Payer: PPO | Source: Ambulatory Visit | Attending: Gastroenterology | Admitting: Gastroenterology

## 2020-12-09 ENCOUNTER — Other Ambulatory Visit: Payer: Self-pay | Admitting: Gastroenterology

## 2020-12-09 ENCOUNTER — Other Ambulatory Visit: Payer: Self-pay

## 2020-12-09 DIAGNOSIS — X32XXXA Exposure to sunlight, initial encounter: Secondary | ICD-10-CM | POA: Diagnosis not present

## 2020-12-09 DIAGNOSIS — Z09 Encounter for follow-up examination after completed treatment for conditions other than malignant neoplasm: Secondary | ICD-10-CM | POA: Diagnosis not present

## 2020-12-09 DIAGNOSIS — R933 Abnormal findings on diagnostic imaging of other parts of digestive tract: Secondary | ICD-10-CM

## 2020-12-09 DIAGNOSIS — K8020 Calculus of gallbladder without cholecystitis without obstruction: Secondary | ICD-10-CM | POA: Diagnosis not present

## 2020-12-09 DIAGNOSIS — L570 Actinic keratosis: Secondary | ICD-10-CM | POA: Diagnosis not present

## 2020-12-09 DIAGNOSIS — D225 Melanocytic nevi of trunk: Secondary | ICD-10-CM | POA: Diagnosis not present

## 2020-12-09 DIAGNOSIS — K760 Fatty (change of) liver, not elsewhere classified: Secondary | ICD-10-CM | POA: Diagnosis not present

## 2020-12-09 DIAGNOSIS — Z872 Personal history of diseases of the skin and subcutaneous tissue: Secondary | ICD-10-CM | POA: Diagnosis not present

## 2020-12-09 DIAGNOSIS — Z85820 Personal history of malignant melanoma of skin: Secondary | ICD-10-CM | POA: Diagnosis not present

## 2020-12-09 DIAGNOSIS — K828 Other specified diseases of gallbladder: Secondary | ICD-10-CM | POA: Diagnosis not present

## 2020-12-09 DIAGNOSIS — K5732 Diverticulitis of large intestine without perforation or abscess without bleeding: Secondary | ICD-10-CM | POA: Diagnosis not present

## 2020-12-09 DIAGNOSIS — K7689 Other specified diseases of liver: Secondary | ICD-10-CM | POA: Diagnosis not present

## 2020-12-09 DIAGNOSIS — R748 Abnormal levels of other serum enzymes: Secondary | ICD-10-CM | POA: Insufficient documentation

## 2020-12-09 DIAGNOSIS — D2262 Melanocytic nevi of left upper limb, including shoulder: Secondary | ICD-10-CM | POA: Diagnosis not present

## 2020-12-09 DIAGNOSIS — D2271 Melanocytic nevi of right lower limb, including hip: Secondary | ICD-10-CM | POA: Diagnosis not present

## 2020-12-09 DIAGNOSIS — D2261 Melanocytic nevi of right upper limb, including shoulder: Secondary | ICD-10-CM | POA: Diagnosis not present

## 2020-12-09 DIAGNOSIS — K8689 Other specified diseases of pancreas: Secondary | ICD-10-CM | POA: Diagnosis not present

## 2020-12-09 IMAGING — MR MR ABDOMEN WO/W CM MRCP
19 of 21 series · 43 of 48 positions shown · IV contrast (gadavist)
Comparison: CT evaluation [DATE].
COMPARISON: CT evaluation [DATE].

Addendum:
CLINICAL DATA: A 69-year-old female presents with cystic pancreatic
lesions discovered on recent abdominal CT which was performed for
abdominal pain.

EXAM:
MRI ABDOMEN WITHOUT AND WITH CONTRAST (INCLUDING MRCP)
TECHNIQUE: Multiplanar multisequence MR imaging of the abdomen was performed
both before and after the administration of intravenous contrast.
Heavily T2-weighted images of the biliary and pancreatic ducts were
obtained, and three-dimensional MRCP images were rendered by post
processing.
CONTRAST:  7mL GADAVIST GADOBUTROL 1 MMOL/ML IV SOLN

[Series 3: T2 · coronal · 6.0mm · 1.19mm/px · 1 of 29 slices shown (1 of 2)]
[im 1/29]
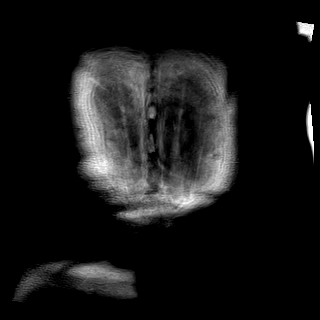

[Series 4: T2 · axial · 6.0mm · 1.19mm/px · 1 of 32 slices shown (2 of 2)]
[im 1/32]
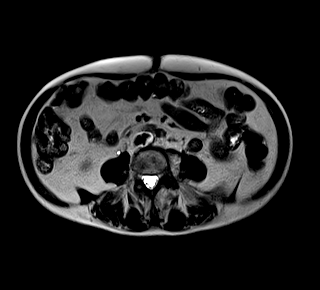

[Series 5: T1 · axial · 3.0mm · 1.19mm/px · z∈[-135,+102]mm · 2 of 80 slices shown (1 of 2)]
[im 1/80]
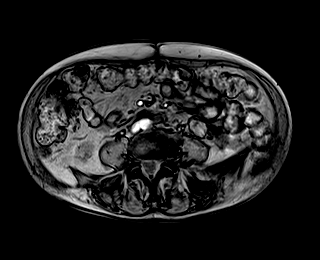
[im 80/80]
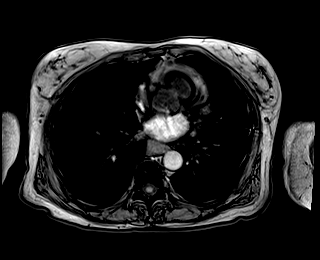

[Series 6: T1 · axial · 3.0mm · 1.19mm/px · z∈[-135,+102]mm · 2 of 80 slices shown (2 of 2)]
[im 1/80]
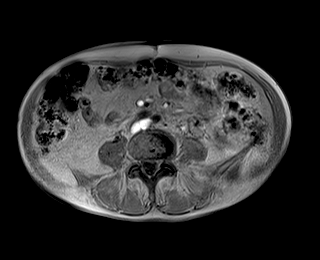
[im 80/80]
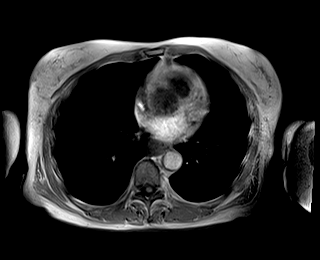

[Series 9: T2 fat-sat · axial · 6.0mm · 1.19mm/px · 1 of 34 slices shown]
[im 1/34]
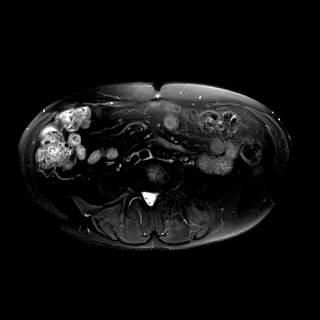

[Series 10: ax dwi_tracew · axial · 6.0mm · 1.42mm/px · z∈[-142,+109]mm · 4 of 108 slices shown]
[im 1/108]
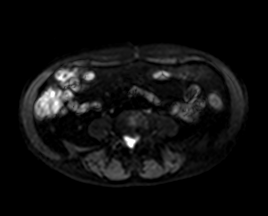
[im 36/108]
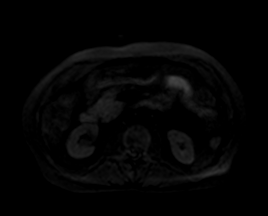
[im 72/108]
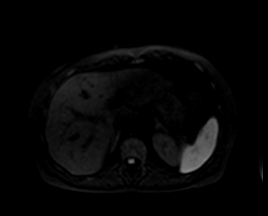
[im 108/108]
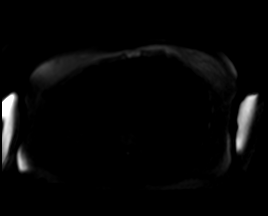

[Series 11: ax dwi_adc · axial · 6.0mm · 1.42mm/px · 1 of 36 slices shown]
[im 1/36]
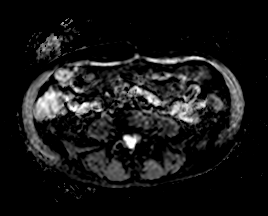

[Series 15: MRCP · coronal · 3.0mm · 1.12mm/px · 1 of 17 slices shown]
[im 1/17]
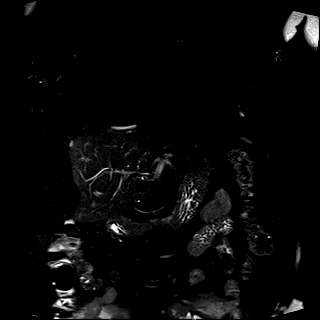

[Series 16: radials · oblique · 50.0mm · 0.78mm/px · 1 of 5 slices shown]
[im 1/5]
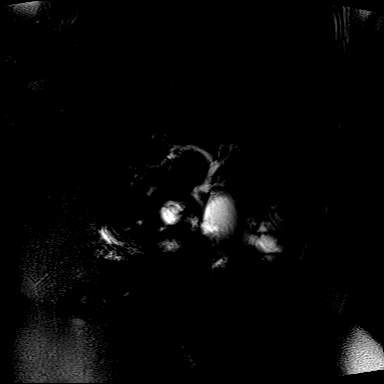

[Series 17: T1 dynamic fat-sat · axial · non-contrast · 3.0mm · 1.19mm/px · z∈[-135,+102]mm · 3 of 80 slices shown (1 of 5)]
[im 1/80]
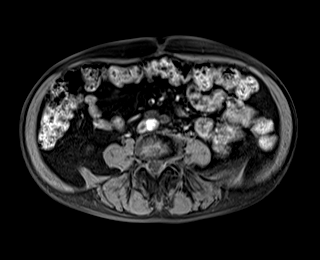
[im 40/80]
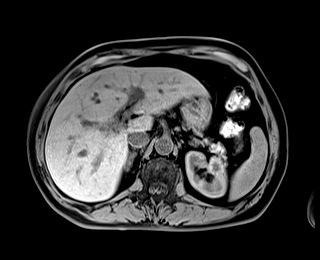
[im 80/80]
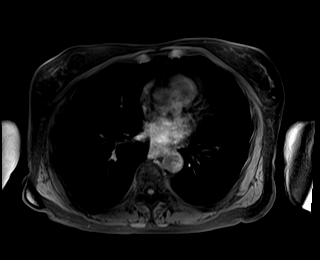

[Series 18: T1 dynamic fat-sat post-contrast · axial · 3.0mm · 1.19mm/px · z∈[-135,+102]mm · 3 of 80 slices shown (1 of 4)]
[im 1/80]
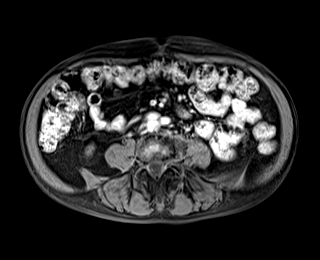
[im 40/80]
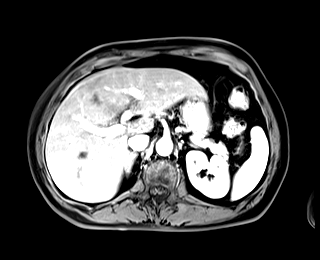
[im 80/80]
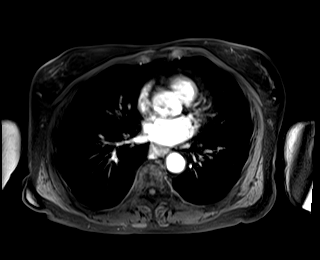

[Series 19: T1 dynamic fat-sat · axial · 3.0mm · 1.19mm/px · z∈[-135,+102]mm · 3 of 80 slices shown (2 of 5)]
[im 1/80]
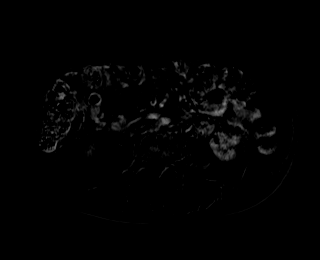
[im 40/80]
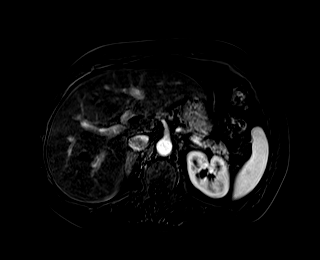
[im 80/80]
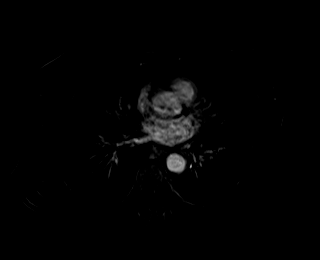

[Series 20: T1 dynamic fat-sat post-contrast · axial · 3.0mm · 1.19mm/px · z∈[-135,+102]mm · 3 of 80 slices shown (2 of 4)]
[im 1/80]
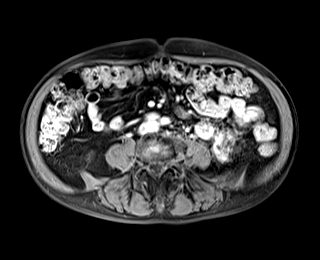
[im 40/80]
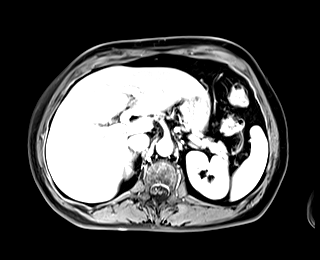
[im 80/80]
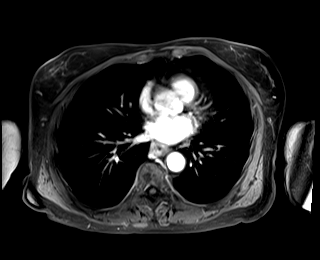

[Series 21: T1 dynamic fat-sat · axial · 3.0mm · 1.19mm/px · z∈[-135,+102]mm · 3 of 80 slices shown (3 of 5)]
[im 1/80]
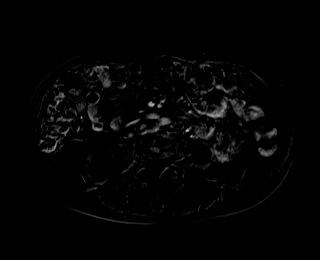
[im 40/80]
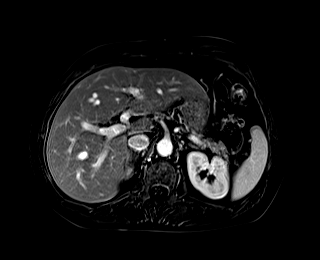
[im 80/80]
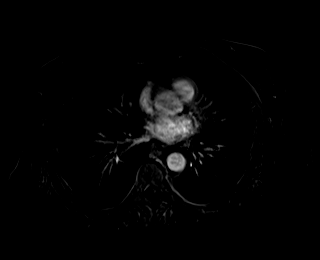

[Series 22: T1 dynamic fat-sat post-contrast · axial · 3.0mm · 1.19mm/px · z∈[-135,+102]mm · 3 of 80 slices shown (3 of 4)]
[im 1/80]
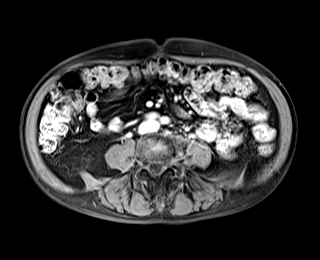
[im 40/80]
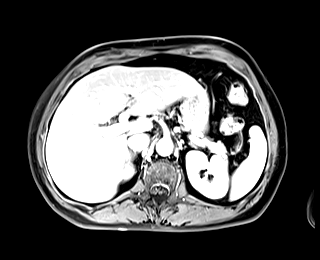
[im 80/80]
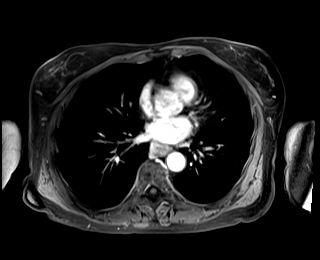

[Series 23: T1 dynamic fat-sat · axial · 3.0mm · 1.19mm/px · z∈[-135,+102]mm · 3 of 80 slices shown (4 of 5)]
[im 1/80]
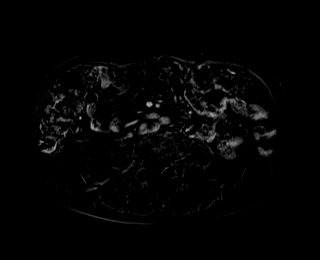
[im 40/80]
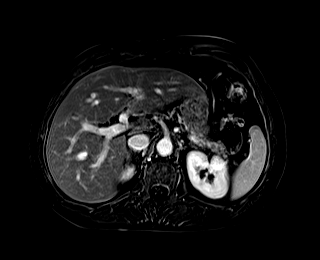
[im 80/80]
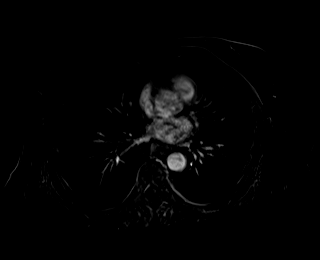

[Series 24: T1 dynamic post-contrast · coronal · 3.0mm · 1.31mm/px · 3 of 72 slices shown]
[im 1/72]
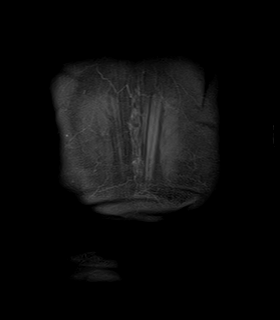
[im 36/72]
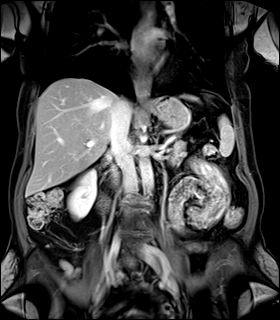
[im 72/72]
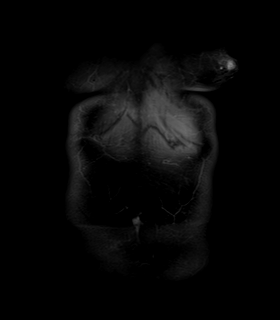

[Series 25: T1 dynamic fat-sat post-contrast · axial · 3.0mm · 1.19mm/px · z∈[-135,+102]mm · 3 of 80 slices shown (4 of 4)]
[im 1/80]
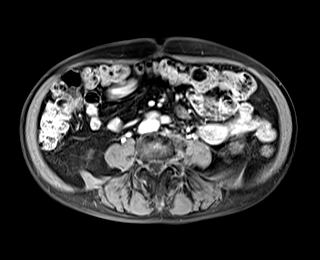
[im 40/80]
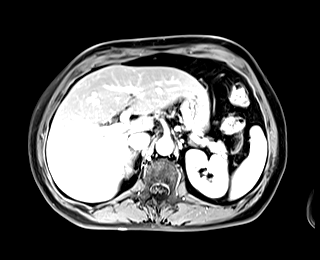
[im 80/80]
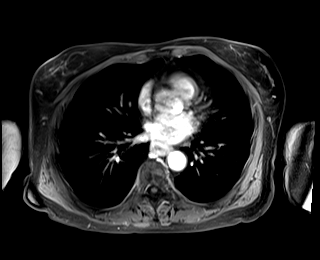

[Series 26: T1 dynamic fat-sat · axial · 3.0mm · 1.19mm/px · z∈[-135,-18]mm · 2 of 80 slices shown (5 of 5)]
[im 1/80]
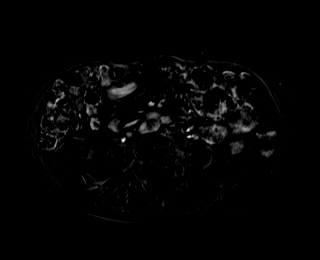
[im 40/80]
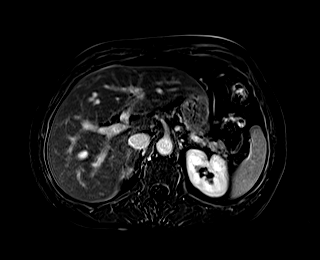

[43 of 48 positions shown; findings below may reference images not displayed]

FINDINGS: Lower chest: Incidental imaging of the lung bases is unremarkable by
MRI with limited assessment.

Hepatobiliary: Smooth hepatic contour. Cholelithiasis without
biliary duct dilation or pericholecystic stranding. No focal,
suspicious hepatic lesion. Portal vein is patent. Sludge in the
dependent gallbladder.

A mild hepatic steatosis.

Pancreas: Multi simple pancreatic lesions with cystic features
largest in the neck of the pancreas measuring 2.2 cm. MRCP sequences
are however limited due to respiratory motion. No substantial
dilation of the main pancreatic duct or biliary duct as outlined
above. At least 10 cystic lesions are demonstrated. Next largest are
in the tail of the pancreas in these measure approximately 11 mm and
8 mm respectively on image [DATE]. Largest without definitive signs of
communication with the main pancreatic duct. Some of the smaller
lesions do show signs of main pancreatic ductal communication. None
of these lesions show internal enhancement or nodularity internally.

Smaller lesions in the head of the pancreas largest approximately 7
mm (image [DATE]) this is in the posterior head of the pancreas.
Smaller lesion still, scattered about pancreatic parenchyma. None
with overtly suspicious features without secondary signs such as
main duct dilation. No peripancreatic stranding. Intrinsic T1 signal
in the pancreas is preserved.

Spleen:  Unremarkable.

Adrenals/Urinary Tract: Adrenal glands are normal. No perinephric
stranding. No hydronephrosis. No suspicious renal lesion.

Stomach/Bowel: Unremarkable to the extent evaluated on abdominal
MRI.

Vascular/Lymphatic: No pathologically enlarged lymph nodes
identified. No abdominal aortic aneurysm demonstrated.

Other:  No ascites.

Musculoskeletal: No suspicious bone lesions identified.
IMPRESSION: Multiple cystic lesions of the pancreas without nodular internal
enhancement or main duct dilation. Some of these appear to represent
side branch intraductal papillary mucinous neoplasms. The dominant
lesion without definitive ductal communication. For this reason
suggest initial follow-up at 6 month interval. If ductal
communication can be established this may increase the subsequent
follow-up intervals. Ultimately surveillance may be warranted for up
to 10 years.

MRCP sequences are limited due to respiratory motion. No substantial
dilation of the main pancreatic duct or biliary duct.

Cholelithiasis and biliary sludge without acute biliary process.

Mild hepatic steatosis.

ADDENDUM:
Not mentioned in the initial report is the presence of marked distal
esophageal thickening, potentially related to esophagitis but more
pronounced than potentially would be expected for esophagectomy
assist. Suggest correlation with symptoms that would suggest
esophagitis and consider dedicated esophageal evaluation to exclude
neoplasm if not recently performed.

These results will be called to the ordering clinician or
representative by the Radiologist Assistant, and communication
documented in the PACS or [REDACTED].

*** End of Addendum ***
FINDINGS: Lower chest: Incidental imaging of the lung bases is unremarkable by
MRI with limited assessment.

Hepatobiliary: Smooth hepatic contour. Cholelithiasis without
biliary duct dilation or pericholecystic stranding. No focal,
suspicious hepatic lesion. Portal vein is patent. Sludge in the
dependent gallbladder.

A mild hepatic steatosis.

Pancreas: Multi simple pancreatic lesions with cystic features
largest in the neck of the pancreas measuring 2.2 cm. MRCP sequences
are however limited due to respiratory motion. No substantial
dilation of the main pancreatic duct or biliary duct as outlined
above. At least 10 cystic lesions are demonstrated. Next largest are
in the tail of the pancreas in these measure approximately 11 mm and
8 mm respectively on image [DATE]. Largest without definitive signs of
communication with the main pancreatic duct. Some of the smaller
lesions do show signs of main pancreatic ductal communication. None
of these lesions show internal enhancement or nodularity internally.

Smaller lesions in the head of the pancreas largest approximately 7
mm (image [DATE]) this is in the posterior head of the pancreas.
Smaller lesion still, scattered about pancreatic parenchyma. None
with overtly suspicious features without secondary signs such as
main duct dilation. No peripancreatic stranding. Intrinsic T1 signal
in the pancreas is preserved.

Spleen:  Unremarkable.

Adrenals/Urinary Tract: Adrenal glands are normal. No perinephric
stranding. No hydronephrosis. No suspicious renal lesion.

Stomach/Bowel: Unremarkable to the extent evaluated on abdominal
MRI.

Vascular/Lymphatic: No pathologically enlarged lymph nodes
identified. No abdominal aortic aneurysm demonstrated.

Other:  No ascites.

Musculoskeletal: No suspicious bone lesions identified.
IMPRESSION: Multiple cystic lesions of the pancreas without nodular internal
enhancement or main duct dilation. Some of these appear to represent
side branch intraductal papillary mucinous neoplasms. The dominant
lesion without definitive ductal communication. For this reason
suggest initial follow-up at 6 month interval. If ductal
communication can be established this may increase the subsequent
follow-up intervals. Ultimately surveillance may be warranted for up
to 10 years.

MRCP sequences are limited due to respiratory motion. No substantial
dilation of the main pancreatic duct or biliary duct.

Cholelithiasis and biliary sludge without acute biliary process.

Mild hepatic steatosis.

## 2020-12-09 MED ORDER — GADOBUTROL 1 MMOL/ML IV SOLN
7.0000 mL | Freq: Once | INTRAVENOUS | Status: AC | PRN
  Administered 2020-12-09: 7 mL via INTRAVENOUS

## 2020-12-10 ENCOUNTER — Other Ambulatory Visit: Payer: Self-pay

## 2020-12-10 DIAGNOSIS — K862 Cyst of pancreas: Secondary | ICD-10-CM

## 2020-12-10 DIAGNOSIS — R935 Abnormal findings on diagnostic imaging of other abdominal regions, including retroperitoneum: Secondary | ICD-10-CM

## 2020-12-11 ENCOUNTER — Other Ambulatory Visit: Payer: Self-pay

## 2020-12-11 DIAGNOSIS — K2289 Other specified disease of esophagus: Secondary | ICD-10-CM

## 2020-12-11 DIAGNOSIS — K222 Esophageal obstruction: Secondary | ICD-10-CM

## 2020-12-11 DIAGNOSIS — R935 Abnormal findings on diagnostic imaging of other abdominal regions, including retroperitoneum: Secondary | ICD-10-CM

## 2020-12-11 DIAGNOSIS — K209 Esophagitis, unspecified without bleeding: Secondary | ICD-10-CM

## 2020-12-13 ENCOUNTER — Encounter: Payer: Self-pay | Admitting: Gastroenterology

## 2020-12-13 ENCOUNTER — Other Ambulatory Visit: Payer: Self-pay

## 2020-12-13 ENCOUNTER — Ambulatory Visit (AMBULATORY_SURGERY_CENTER): Payer: PPO | Admitting: Gastroenterology

## 2020-12-13 VITALS — BP 138/63 | HR 66 | Temp 98.4°F | Resp 13 | Ht 68.5 in | Wt 167.0 lb

## 2020-12-13 DIAGNOSIS — K317 Polyp of stomach and duodenum: Secondary | ICD-10-CM | POA: Diagnosis not present

## 2020-12-13 DIAGNOSIS — K222 Esophageal obstruction: Secondary | ICD-10-CM

## 2020-12-13 DIAGNOSIS — K2960 Other gastritis without bleeding: Secondary | ICD-10-CM

## 2020-12-13 DIAGNOSIS — R933 Abnormal findings on diagnostic imaging of other parts of digestive tract: Secondary | ICD-10-CM | POA: Diagnosis not present

## 2020-12-13 DIAGNOSIS — R1310 Dysphagia, unspecified: Secondary | ICD-10-CM

## 2020-12-13 DIAGNOSIS — K2970 Gastritis, unspecified, without bleeding: Secondary | ICD-10-CM | POA: Diagnosis not present

## 2020-12-13 MED ORDER — SODIUM CHLORIDE 0.9 % IV SOLN: 500.0000 mL | Freq: Once | INTRAVENOUS | Status: DC

## 2020-12-13 NOTE — Op Note (Signed)
[place] Patient Name: Elizabeth Curtis Procedure Date: 12/13/2020 1:26 PM MRN: [id number] Endoscopist: Thornton Park MD, MD Age: 70 Referring MD:  Date of Birth: 1950-12-15 Gender: Female Account #: [id number] Procedure:                Upper GI endoscopy Indications:              Dysphagia, Abnormal MRI of the GI tract                           Recent MRI showed distal esophageal thickening with                            radiologist suggesting the findings were more than                            would be expected with esophagitis                           Prior EGD in 2010 with dilation of Schatzki's Ring                           Intermittent dysphagia to solids x Many years Medicines:                Monitored Anesthesia Care Procedure:                Pre-Anesthesia Assessment:                           - Prior to the procedure, a History and Physical                            was performed, and patient medications and                            allergies were reviewed. The patient's tolerance of                            previous anesthesia was also reviewed. The risks                            and benefits of the procedure and the sedation                            options and risks were discussed with the patient.                            All questions were answered, and informed consent                            was obtained. Prior Anticoagulants: The patient has                            taken no previous anticoagulant or antiplatelet  agents. ASA Grade Assessment: III - A patient with                            severe systemic disease. After reviewing the risks                            and benefits, the patient was deemed in                            satisfactory condition to undergo the procedure.                           After obtaining informed consent, the endoscope was                            passed under direct  vision. Throughout the                            procedure, the patient's blood pressure, pulse, and                            oxygen saturations were monitored continuously. The                            #[id number] Olympus Endoscope was introduced through the                            mouth, and advanced to the third part of duodenum.                            The upper GI endoscopy was accomplished without                            difficulty. The patient tolerated the procedure                            well. Scope In: Scope Out: Findings:                 A non-obstructing Schatzki ring was found at the                            gastroesophageal junction. A TTS dilator was passed                            through the scope. Dilation with a 16-17-18 mm                            balloon dilator was performed to 18 mm. There was                            minimal dilation with a fully inflated balloon. The  dilation site was examined and showed mild mucosal                            disruption. After dilation, biopsies were obtained                            from the mid/proximal and distal esophagus with                            cold forceps for histology of suspected                            eosinophilic esophagitis.                           A few small sessile polyps were found in the                            gastric body. These have the appearance of fundic                            gland polyps. Biopsies were taken with a cold                            forceps for histology to exclude adenoma. Estimated                            blood loss was minimal.                           The examined duodenum was normal. Complications:            No immediate complications. Estimated blood loss:                            Minimal. Estimated Blood Loss:     Estimated blood loss was minimal. Impression:               - Non-obstructing Schatzki ring.  Dilated. Biopsied.                           - No esophageal malignancy seen on this exam.                           - A few gastric polyps. Likely fundic gland polyps.                            Biopsied.                           - Normal examined duodenum. Recommendation:           - Patient has a contact number available for                            emergencies. The signs and symptoms of potential  delayed complications were discussed with the                            patient. Return to normal activities tomorrow.                            Written discharge instructions were provided to the                            patient.                           - Resume previous diet.                           - Continue present medications.                           - Await pathology results.                           - Follow-up in the office as previously planned. Thornton Park MD, MD 12/13/2020 1:50:09 PM This report has been signed electronically.

## 2020-12-13 NOTE — Progress Notes (Signed)
A and O x3. Report to RN. Tolerated MAC anesthesia well.Teeth unchanged after procedure. 

## 2020-12-13 NOTE — Progress Notes (Signed)
VS completed by AG  Pt's states no medical or surgical changes since previsit or office visit.

## 2020-12-13 NOTE — Progress Notes (Signed)
Referring Provider: Ria Bush, MD Primary Care Physician:  Ria Bush, MD  Reason for Procedure:  Esophageal abnormality on MRI   IMPRESSION:  Esophageal abnormality on MRI Appropriate candidate for monitored anesthesia care  PLAN: EGD in the [place] today   HPI: Elizabeth Curtis is a 70 y.o. female presents for endoscopy to evaluate an abnormal MRI scan.   Surveillance MRI to follow-up on pancreatic cysts included the following addendum: ADDENDUM: Not mentioned in the initial report is the presence of marked distal esophageal thickening, potentially related to esophagitis but more pronounced than potentially would be expected for esophagectomy assist. Suggest correlation with symptoms that would suggest esophagitis and consider dedicated esophageal evaluation to exclude neoplasm if not recently performed.  Mild changes of esophagitis seen on a CT scan 10/2020.  EGD in 2010 showed a Schatzki's ring that was dilated. No other abnormalities.  Past Medical History:  Diagnosis Date   Allergy    Arthritis    OA   Blood transfusion without reported diagnosis    with C Section    Cataract 2020   removed both eyes    Chronic low back pain    Diabetes mellitus    Diffuse cystic mastopathy    Disturbance of skin sensation    Diverticulosis of colon (without mention of hemorrhage)    Dysphagia, unspecified(787.20)    Essential hypertension, benign 1995   H/O cystitis 2011   History of colonic polyps    Hyperplastic   History of pelvic ultrasound 03/05   Uterus 13 X 7 X 8cm, Fibroid 2.6cm   HLD (hyperlipidemia)    borderline    Lipoma    Mammographic microcalcification 2013   MDD (major depressive disorder) 11/22/2008   Melanoma ([place])    upper abdomin left SN removed under axilla   Osteopenia 03/2015   DEXA hip -1.7, spine -1.7   Plantar wart    Schatzki's ring 07/23/08   EGD dilated O/W normal (Dr. 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   Vision abnormalities     Past Surgical History:  Procedure Laterality Date   BREAST BIOPSY Right 07/2011   rec rpt 6 mo, neg   BREAST BIOPSY Right 06/07/2019   [place] core  coil clip - fibrocystic change with apocrine metaplasia   [place]   [place] presentation   COLONOSCOPY  2010   [place], rec rpt 10 yrs   COLONOSCOPY  02/2020   multiple polyps, hem, diverticulosis, lipoma, rpt 3 yrs ([facility])   ESOPHAGOGASTRODUODENOSCOPY  2010   [place], dilated schatzki ring   EYE SURGERY Left 2012   cataract   INTRAOCULAR LENS EXCHANGE Left 05/2017   uveitis-glaucoma-hypheme syndrome ([place])   KNEE ARTHROSCOPY W/ MENISCAL REPAIR Left 01/2014   [place]   MELANOMA EXCISION  2012   upper abdomen   POLYPECTOMY     2008 [facility], 2010 [facility]    UPPER GASTROINTESTINAL ENDOSCOPY      Current Outpatient Medications  Medication Sig Dispense Refill   amLODipine (NORVASC) 5 MG tablet Take 1 tablet (5 mg total) by mouth at bedtime. 90 tablet 3   BD PEN NEEDLE NANO 2ND GEN 32G X 4 MM MISC USE AS DIRECTED ONCE DAILY 100 each 3   Calcium Carbonate-Vitamin D (CALCIUM-VITAMIN D3 PO) Take by mouth. 1200 mg of calcium; 1000 units of Vit D3     Continuous Blood Gluc Receiver (FREESTYLE LIBRE 2 READER) DEVI Use as directed to check sugars  daily E11.65 1 each 3   Continuous Blood Gluc Sensor (FREESTYLE LIBRE 14 DAY SENSOR) MISC 1 Device by Does not apply route as directed. 2 each 3   enalapril (VASOTEC) 20 MG tablet Take 1 tablet (20 mg total) by mouth 2 (two) times daily. 180 tablet 3   glipiZIDE (GLUCOTROL) 5 MG tablet Take 1 tablet (5 mg total) by mouth 2 (two) times daily before a meal. 180 tablet 3   glucose blood (FREESTYLE LITE) test strip USE TO CHECK SUGAR ONCE DAILY AND AS NEEDED. DX:E11.65 100 each 3   insulin glargine (LANTUS SOLOSTAR) 100 UNIT/ML Solostar Pen Inject 20 Units into the skin daily. 15 mL 4   Magnesium 250 MG TABS Take by mouth.      metFORMIN (GLUCOPHAGE-XR) 500 MG 24 hr tablet TAKE 1 (ONE) TABLET IN AM AND 2 (TWO) TABLETS IN PM WITH MEALS 270 tablet 3   MISC NATURAL PRODUCTS PO Take by mouth. CBD supplement 0.5 mg by mouth daily 1500 mg PRN     OVER THE COUNTER MEDICATION CBD PM 1000 mg with Melatonin as needed for sleep     TURMERIC CURCUMIN PO Take 750 mg by mouth daily.     ZADITOR 0.025 % ophthalmic solution Apply to eye.     Cholecalciferol (VITAMIN D3 PO) Take 5,000 Units by mouth daily. (Patient not taking: No sig reported)     Current Facility-Administered Medications  Medication Dose Route Frequency Provider Last Rate Last Admin   0.9 %  sodium chloride infusion  500 mL Intravenous Once Thornton Park, MD        Allergies as of 12/13/2020 - Review Complete 12/13/2020  Allergen Reaction Noted   Avandia [rosiglitazone] Other (See Comments) 09/23/2012   Colesevelam Nausea Only and Other (See Comments) 10/18/2020   Other Other (See Comments) 09/22/2012   Statins Other (See Comments) 38/11/1749   Trulicity [dulaglutide] Other (See Comments) 10/12/2016    Family History  Problem Relation Age of Onset   Diabetes Mother    Hyperlipidemia Mother    Hypertension Mother    Mental illness Mother        Personality disorder   Kidney disease Father        failure, (Diaylsis)   Diabetes Father    Diabetes Brother    Coronary artery disease Brother 19       CAD, hospitalized freq, PTCA   Gout Brother    Hypertension Brother    Cancer Maternal Aunt 62       breast   Breast cancer Maternal Aunt 52   Cancer Paternal Aunt 70       breast   Breast cancer Paternal Aunt 16   ALS Paternal Aunt    Stroke Paternal Grandfather    Stroke Maternal Grandmother    Breast cancer Maternal Aunt 70   Colon cancer Neg Hx    Colon polyps Neg Hx    Esophageal cancer Neg Hx    Rectal cancer Neg Hx    Stomach cancer Neg Hx      Physical Exam: General:   Alert,  well-nourished, pleasant and cooperative in  NAD Head:  Normocephalic and atraumatic. Eyes:  Sclera clear, no icterus.   Conjunctiva pink. Mouth:  No deformity or lesions.   Neck:  Supple; no masses or thyromegaly. Lungs:  Clear throughout to auscultation.   No wheezes. Heart:  Regular rate and rhythm; no murmurs. Abdomen:  Soft, non-tender, nondistended, normal bowel sounds, no rebound or guarding.  Msk:  Symmetrical. No boney deformities LAD: No inguinal or umbilical LAD Extremities:  No clubbing or edema. Neurologic:  Alert and  oriented x4;  grossly nonfocal Skin:  No obvious rash or bruise. Psych:  Alert and cooperative. Normal mood and affect.    Ademide Schaberg L. Tarri Glenn, MD, MPH 12/13/2020, 1:07 PM

## 2020-12-13 NOTE — Progress Notes (Signed)
Called to room to assist during endoscopic procedure.  Patient ID and intended procedure confirmed with present staff. Received instructions for my participation in the procedure from the performing physician.  

## 2020-12-13 NOTE — Patient Instructions (Signed)
Await pathology results from the biopsies taken today.  Resume previous diet and medications.   YOU HAD AN ENDOSCOPIC PROCEDURE TODAY AT [facility]:   Refer to the procedure report that was given to you for any specific questions about what was found during the examination.  If the procedure report does not answer your questions, please call your gastroenterologist to clarify.  If you requested that your care partner not be given the details of your procedure findings, then the procedure report has been included in a sealed envelope for you to review at your convenience later.  YOU SHOULD EXPECT: Some feelings of bloating in the abdomen. Passage of more gas than usual.  Walking can help get rid of the air that was put into your GI tract during the procedure and reduce the bloating. If you had a lower endoscopy (such as a colonoscopy or flexible sigmoidoscopy) you may notice spotting of blood in your stool or on the toilet paper. If you underwent a bowel prep for your procedure, you may not have a normal bowel movement for a few days.  Please Note:  You might notice some irritation and congestion in your nose or some drainage.  This is from the oxygen used during your procedure.  There is no need for concern and it should clear up in a day or so.  SYMPTOMS TO REPORT IMMEDIATELY:   Following upper endoscopy (EGD)  Vomiting of blood or coffee ground material  New chest pain or pain under the shoulder blades  Painful or persistently difficult swallowing  New shortness of breath  Fever of 100F or higher  Black, tarry-looking stools  For urgent or emergent issues, a gastroenterologist can be reached at any hour by calling [phone]. Do not use MyChart messaging for urgent concerns.    DIET:  We do recommend a small meal at first, but then you may proceed to your regular diet.  Drink plenty of fluids but you should avoid alcoholic beverages for 24 hours.  ACTIVITY:  You  should plan to take it easy for the rest of today and you should NOT DRIVE or use heavy machinery until tomorrow (because of the sedation medicines used during the test).    FOLLOW UP: Our staff will call the number listed on your records 48-72 hours following your procedure to check on you and address any questions or concerns that you may have regarding the information given to you following your procedure. If we do not reach you, we will leave a message.  We will attempt to reach you two times.  During this call, we will ask if you have developed any symptoms of COVID 19. If you develop any symptoms (ie: fever, flu-like symptoms, shortness of breath, cough etc.) before then, please call [phone].  If you test positive for Covid 19 in the 2 weeks post procedure, please call and report this information to [place].    If any biopsies were taken you will be contacted by phone or by letter within the next 1-3 weeks.  Please call us at [phone] if you have not heard about the biopsies in 3 weeks.    SIGNATURES/CONFIDENTIALITY: You and/or your care partner have signed paperwork which will be entered into your electronic medical record.  These signatures attest to the fact that that the information above on your After Visit Summary has been reviewed and is understood.  Full responsibility of the confidentiality of this discharge information lies with you and/or  your care-partner.

## 2020-12-16 ENCOUNTER — Encounter: Payer: Self-pay | Admitting: Family Medicine

## 2020-12-16 NOTE — Telephone Encounter (Signed)
Updated pt's chart.  

## 2020-12-17 ENCOUNTER — Telehealth: Payer: Self-pay

## 2020-12-17 NOTE — Telephone Encounter (Signed)
  Follow up Call-  Call back number 12/13/2020 03/08/2020  Post procedure Call Back phone  # [phone]  Permission to leave phone message Yes Yes  Some recent data might be hidden     Left message

## 2020-12-25 DIAGNOSIS — E119 Type 2 diabetes mellitus without complications: Secondary | ICD-10-CM | POA: Diagnosis not present

## 2020-12-25 LAB — HM DIABETES EYE EXAM

## 2021-01-09 HISTORY — PX: ESOPHAGOGASTRODUODENOSCOPY: SHX1529

## 2021-01-16 ENCOUNTER — Encounter: Payer: Self-pay | Admitting: Family Medicine

## 2021-01-17 ENCOUNTER — Encounter: Payer: Self-pay | Admitting: Family Medicine

## 2021-01-22 ENCOUNTER — Other Ambulatory Visit: Payer: Self-pay

## 2021-01-22 DIAGNOSIS — K5732 Diverticulitis of large intestine without perforation or abscess without bleeding: Secondary | ICD-10-CM

## 2021-01-22 DIAGNOSIS — R748 Abnormal levels of other serum enzymes: Secondary | ICD-10-CM

## 2021-01-22 DIAGNOSIS — I10 Essential (primary) hypertension: Secondary | ICD-10-CM

## 2021-01-22 DIAGNOSIS — R935 Abnormal findings on diagnostic imaging of other abdominal regions, including retroperitoneum: Secondary | ICD-10-CM

## 2021-01-22 DIAGNOSIS — K862 Cyst of pancreas: Secondary | ICD-10-CM

## 2021-03-07 ENCOUNTER — Ambulatory Visit: Payer: PPO | Admitting: Family Medicine

## 2021-04-15 ENCOUNTER — Encounter: Payer: Self-pay | Admitting: Family Medicine

## 2021-04-15 NOTE — Telephone Encounter (Signed)
Pt having rt knee pain per pt message; Dr Darnell Level does not have available appt and said OK to schedule with Dr Lorelei Pont with sports med. Butch Penny CMA said can use same day on 04/16/21. Unable to reach pt by phone and left v/m requesting pt to cb to schedule appt with Dr Lorelei Pont on 04/16/21. I also mycharted pt as well. Sending note to lsc support and lsc triage. ?

## 2021-04-16 ENCOUNTER — Ambulatory Visit (INDEPENDENT_AMBULATORY_CARE_PROVIDER_SITE_OTHER)
Admission: RE | Admit: 2021-04-16 | Discharge: 2021-04-16 | Disposition: A | Discharge status: Home / Self Care | Payer: PPO | Source: Ambulatory Visit | Attending: Family Medicine | Admitting: Family Medicine

## 2021-04-16 ENCOUNTER — Encounter: Payer: Self-pay | Admitting: Family Medicine

## 2021-04-16 ENCOUNTER — Other Ambulatory Visit: Payer: Self-pay

## 2021-04-16 ENCOUNTER — Ambulatory Visit (INDEPENDENT_AMBULATORY_CARE_PROVIDER_SITE_OTHER): Payer: PPO | Admitting: Family Medicine

## 2021-04-16 VITALS — BP 150/78 | HR 67 | Temp 98.0°F | Ht 68.5 in | Wt 169.1 lb

## 2021-04-16 DIAGNOSIS — M25561 Pain in right knee: Secondary | ICD-10-CM

## 2021-04-16 DIAGNOSIS — M2391 Unspecified internal derangement of right knee: Secondary | ICD-10-CM | POA: Diagnosis not present

## 2021-04-16 DIAGNOSIS — M1711 Unilateral primary osteoarthritis, right knee: Secondary | ICD-10-CM

## 2021-04-16 IMAGING — DX DG KNEE COMPLETE 4+V*R*
4 series · 4 of 4 positions shown · non-contrast
Comparison: None.

CLINICAL DATA: Acute medial right knee pain. Assess degenerative
changes.

EXAM:
RIGHT KNEE - COMPLETE 4+ VIEW

[knee ap]
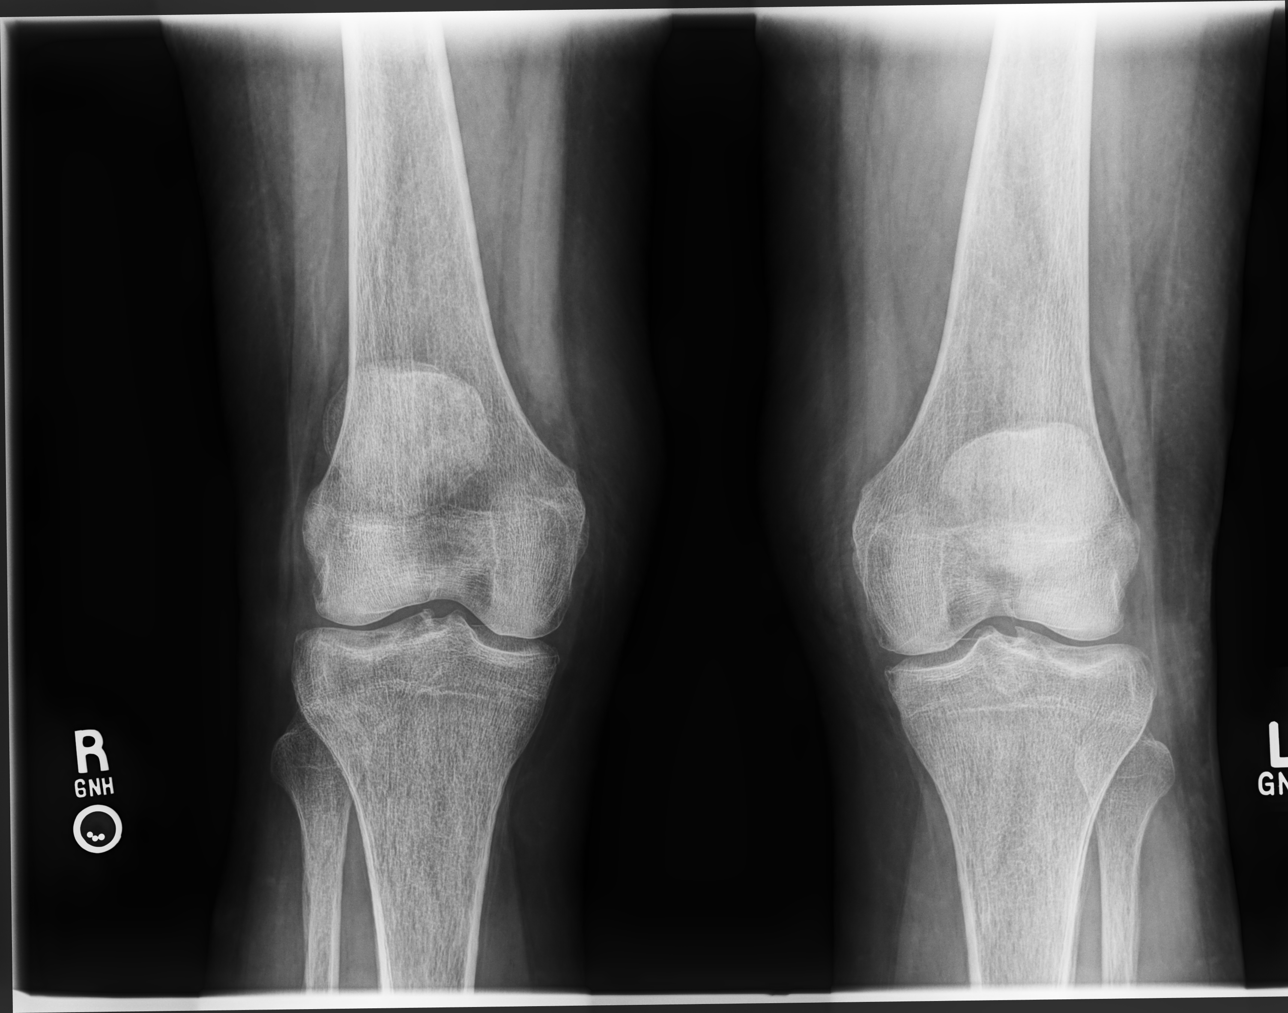

[knee lat]
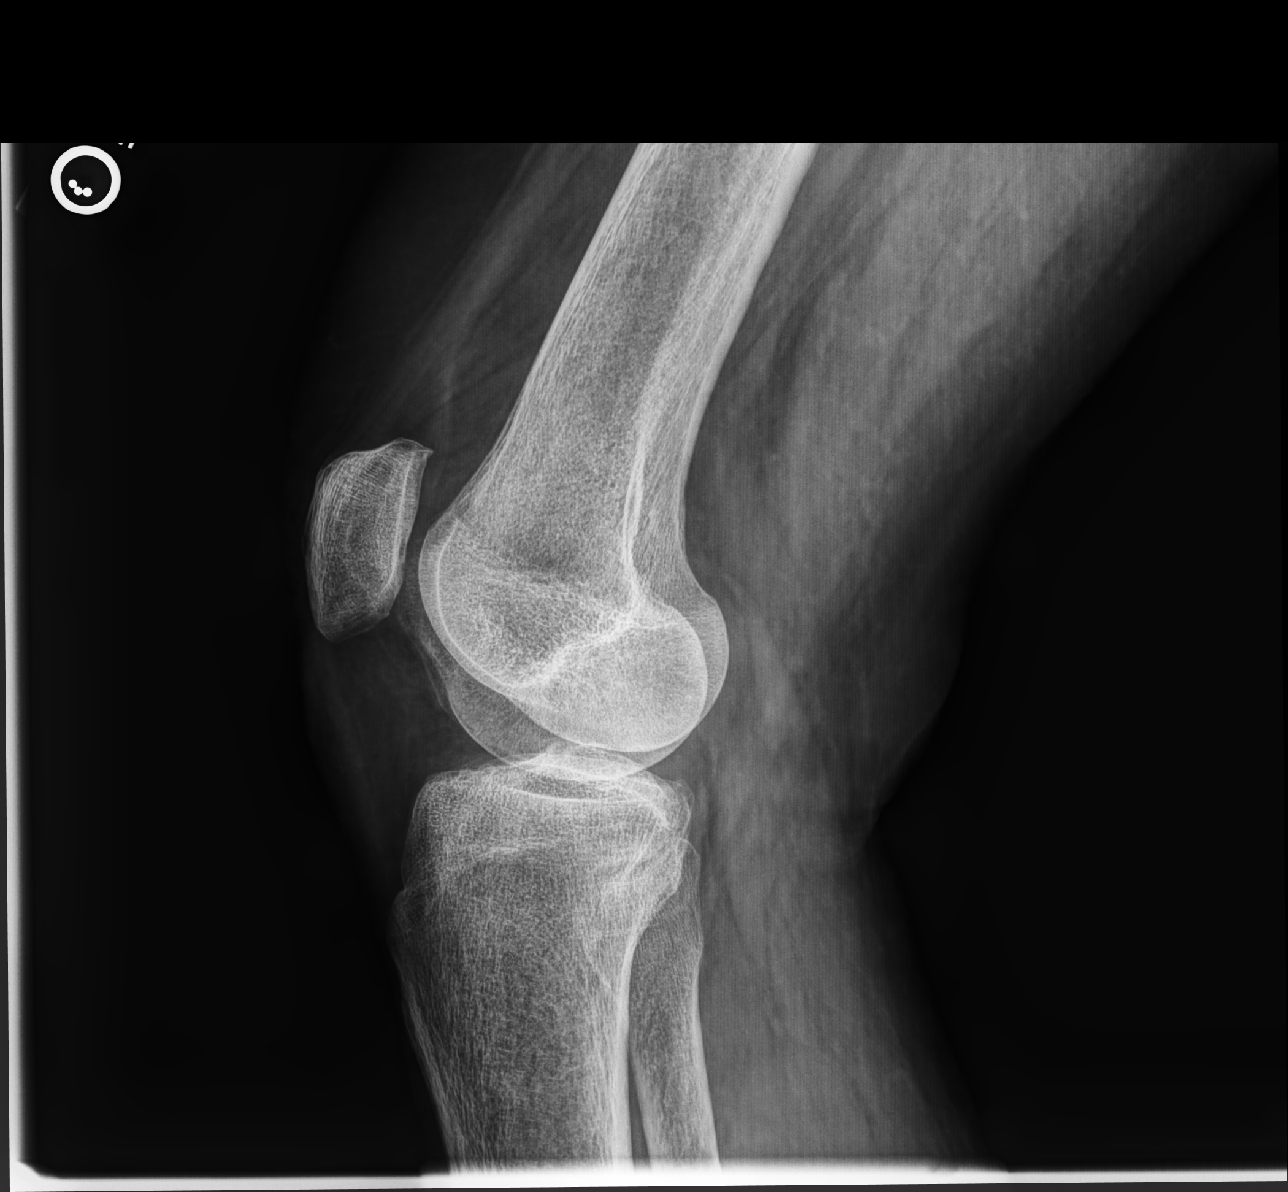

[knee [person_name] view pa]
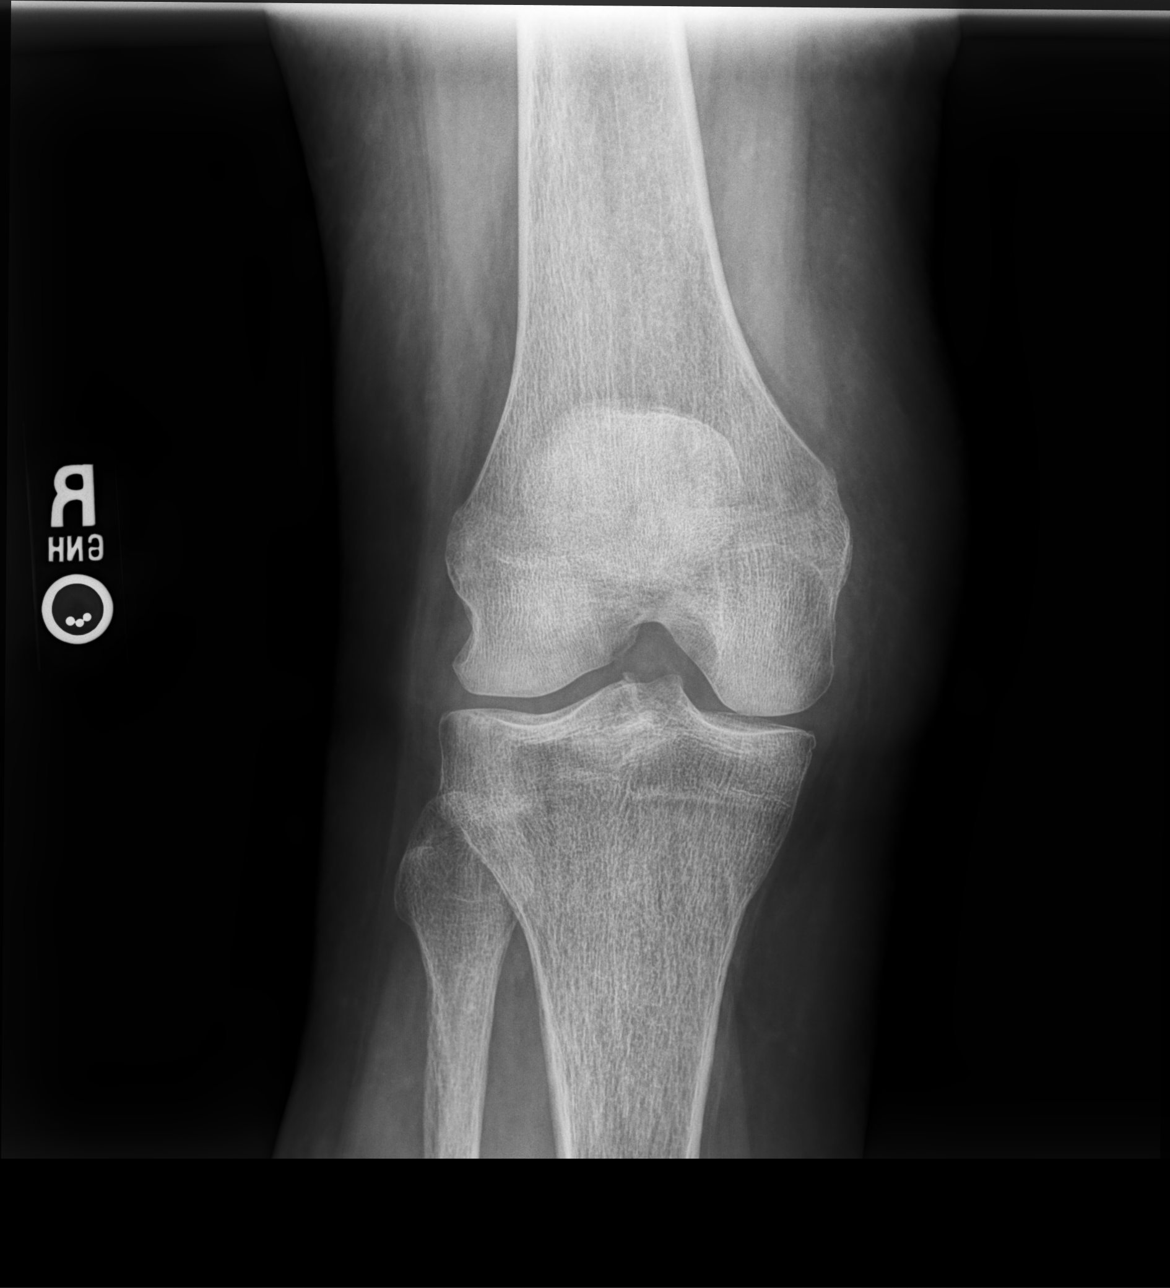

[patella (sunrise) tan]
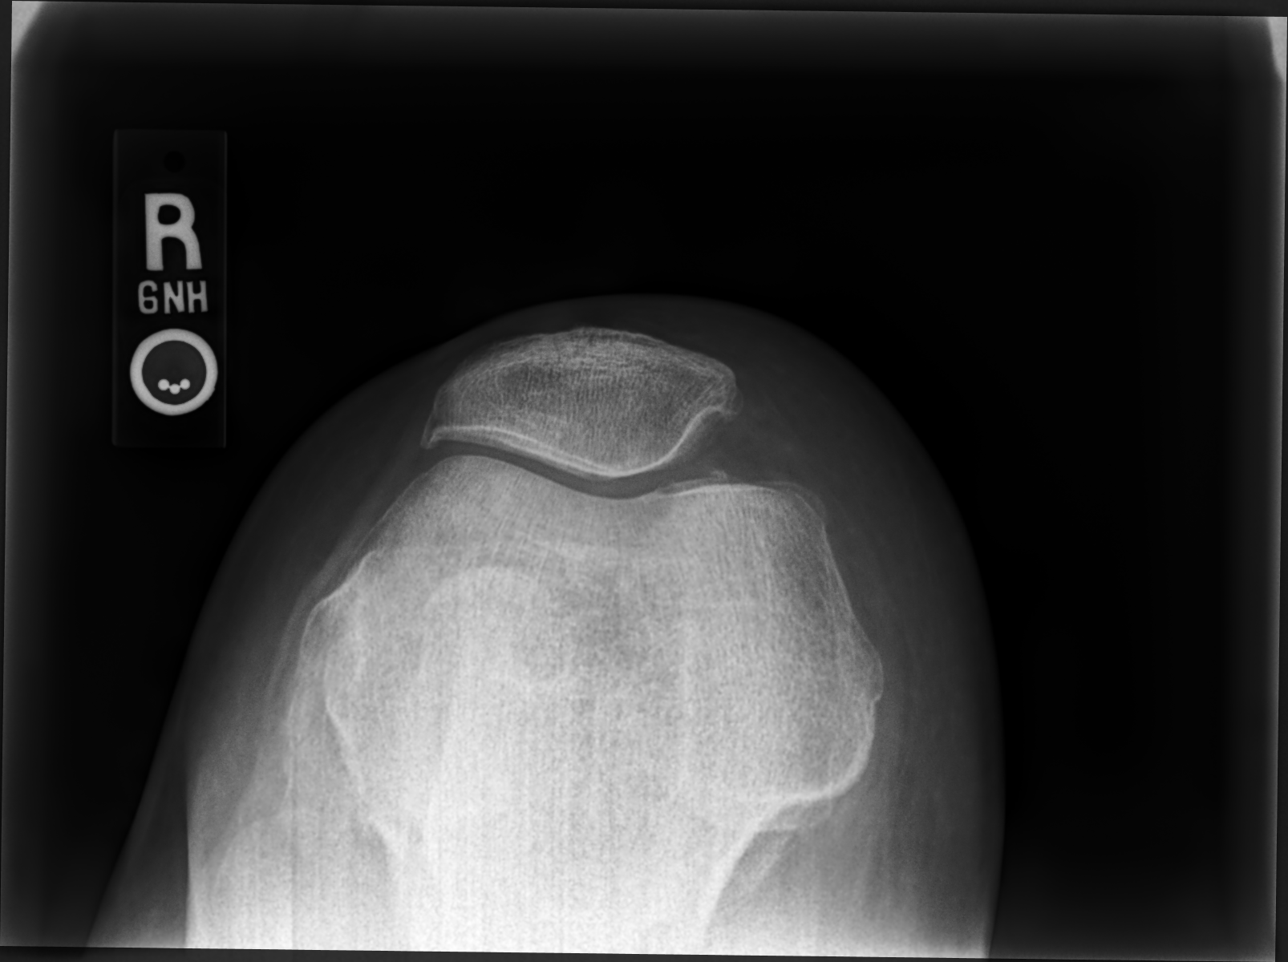

[4 of 4 positions shown; findings below may reference images not displayed]

FINDINGS: Right knee:

There is diffuse decreased bone mineralization, appearing greater
within the right knee compared to the left. Mild medial compartment
joint space narrowing. Mild-to-moderate patella alta. Mild superior
and moderate lateral greater than medial patellar degenerative
osteophytosis. Mild-to-moderate lateral patellofemoral joint space
narrowing.

On frontal view of the left knee there is mild medial compartment
joint space narrowing.
IMPRESSION: :
IMPRESSION: 1. Right knee mild-to-moderate patellofemoral and mild medial
compartment osteoarthritis.
2. Left knee mild medial compartment osteoarthritis.
3. Decreased bone mineralization, greater within the right leg
compared to the left leg. Question disuse osteopenia.

## 2021-04-16 NOTE — Patient Instructions (Signed)
Voltaren 1% gel, over the counter ?You can apply up to 4 times a day ? ?This can be applied to any joint: knee, wrist, fingers, elbows, shoulders, feet and ankles. ?Can apply to any tendon: tennis elbow, achilles, tendon, rotator cuff or any other tendon. ? ?Minimal is absorbed in the bloodstream: ok with oral anti-inflammatory or a blood thinner. ? ?Cost is about 9 dollars  ?

## 2021-04-16 NOTE — Progress Notes (Signed)
? ? ?Jafari Mckillop T. Reality Dejonge, MD, [facility] ?Therapist, music at [facility] ?[place] ?[place], [address] ? ?Phone: [phone]  FAX: [phone] ? ?Elizabeth Curtis - 71 y.o. female  MRN [id number]  Date of Birth: February 09, 1951 ? ?Date: 04/16/2021  PCP: Ria Bush, MD  Referral: Ria Bush, MD ? ?Chief Complaint  ?Patient presents with  ?? Knee Pain  ?  Right  ? ? ?This visit occurred during the SARS-CoV-2 public health emergency.  Safety protocols were in place, including screening questions prior to the visit, additional usage of staff PPE, and extensive cleaning of exam room while observing appropriate contact time as indicated for disinfecting solutions.  ? ?Subjective:  ? ?Elizabeth Curtis is a 71 y.o. very pleasant female patient with Body mass index is 25.34 kg/m?. who presents with the following: ? ?Knee pain, R: Patient describes right-sided medial knee pain that occurred roughly 6 weeks ago when she was standing at the end of her slipper foot and she felt a distinct injury to the medial aspect of her knee.  Since then she has had some knee pain, and with exercise and weightbearing this has worsened and caused her some pain. ? ?She does not have any history of major knee injury on the right side, no fractures or operative interventions.  She did have a left-sided what sounds like a partial meniscectomy and chondroplasty done by Dr. Noemi Chapel on the left side 10 years ago.  She does tell me that she had some arthritic change in this knee even then. ? ?She has tried some basic over-the-counter remedies, and what sounds like topical Pennsaid as well as CBD.  She does get a rash with the topical anti-inflammatories. ? ?Prior to this injury she was easily able to walk a mile to 1-1/2 miles. ? ?She does have some pain getting in and out of a car. ? ?Review of Systems is noted in the HPI, as appropriate ? ?Objective:  ? ?BP (!) 150/78   Pulse 67   Temp 98 ?F (36.7 ?C)  (Oral)   Ht 5' 8.5" (1.74 m)   Wt 169 lb 2 oz (76.7 kg)   SpO2 99%   BMI 25.34 kg/m?  ? ?GEN: No acute distress; alert,appropriate. ?PULM: Breathing comfortably in no respiratory distress ?PSYCH: Normally interactive.  ? ?Right knee: Full extension.  Flexion to 125.  Stable to varus and valgus stress.  Lachman as well as drawer testing is intact anteriorly as well as posteriorly.  No significant pain with loading the medial and lateral patellar facet.  Flexion pinch does not cause pain.  Bounce home testing is negative.  She does have some pain with McMurray's testing without mechanical symptoms. ? ?Laboratory and Imaging Data: ? ?Assessment and Plan:  ? ?  ICD-10-CM   ?1. Internal derangement of right knee  M23.91 Ambulatory referral to Physical Therapy  ?  ?2. Acute pain of right knee  M25.561 DG Knee 4 Views W/Patella Right  ?  Ambulatory referral to Physical Therapy  ?  ?3. Primary osteoarthritis of right knee  M17.11 Ambulatory referral to Physical Therapy  ?  ? ?Total encounter time: 33 minutes. This includes total time spent on the day of encounter.   ? ?X-rays: AP Bilateral Weight-bearing, Weightbearing Lateral, Sunrise views, Lutricia Feil view ?Indication: knee pain ?Findings: No evidence of acute fracture.  Patient does have some mild tricompartmental osteoarthritis, visualized more on the medial compartment with some mild in character. Electronically Signed  By: Owens Loffler, MD On: 04/16/2021 11:00 AM EST  ? ?Most likely degenerative meniscal tear.  At this point, majority of people recommend conservative management and including physical therapy.  At this point we are going to do some topical NSAIDs as well as formal physical therapy with follow-up in 6 weeks. ? ?We could also consider doing a corticosteroid injection, and at this point she wants to hold. ? ?Patient Instructions  ?Voltaren 1% gel, over the counter ?You can apply up to 4 times a day ? ?This can be applied to any joint: knee, wrist,  fingers, elbows, shoulders, feet and ankles. ?Can apply to any tendon: tennis elbow, achilles, tendon, rotator cuff or any other tendon. ? ?Minimal is absorbed in the bloodstream: ok with oral anti-inflammatory or a blood thinner. ? ?Cost is about 9 dollars   ? ?No orders of the defined types were placed in this encounter. ? ?There are no discontinued medications. ?Orders Placed This Encounter  ?Procedures  ?? DG Knee 4 Views W/Patella Right  ?? Ambulatory referral to Physical Therapy  ? ? ?Follow-up: No follow-ups on file. ? ?Dragon Medical One speech-to-text software was used for transcription in this dictation.  Possible transcriptional errors can occur using Editor, commissioning.  ? ?Signed, ? ?Bertha Lokken T. Annye Forrey, MD ? ? ?Outpatient Encounter Medications as of 04/16/2021  ?Medication Sig  ?? amLODipine (NORVASC) 5 MG tablet Take 1 tablet (5 mg total) by mouth at bedtime.  ?? BD PEN NEEDLE NANO 2ND GEN 32G X 4 MM MISC USE AS DIRECTED ONCE DAILY  ?? Calcium Carbonate-Vitamin D (CALCIUM-VITAMIN D3 PO) Take by mouth. 1200 mg of calcium; 1000 units of Vit D3  ?? Cholecalciferol (VITAMIN D3 PO) Take 5,000 Units by mouth daily.  ?? enalapril (VASOTEC) 20 MG tablet Take 1 tablet (20 mg total) by mouth 2 (two) times daily.  ?? glipiZIDE (GLUCOTROL) 5 MG tablet Take 1 tablet (5 mg total) by mouth 2 (two) times daily before a meal.  ?? glucose blood (FREESTYLE LITE) test strip USE TO CHECK SUGAR ONCE DAILY AND AS NEEDED. DX:E11.65  ?? insulin glargine (LANTUS SOLOSTAR) 100 UNIT/ML Solostar Pen Inject 20 Units into the skin daily.  ?? Magnesium 250 MG TABS Take by mouth.  ?? metFORMIN (GLUCOPHAGE-XR) 500 MG 24 hr tablet TAKE 1 (ONE) TABLET IN AM AND 2 (TWO) TABLETS IN PM WITH MEALS  ?? MISC NATURAL PRODUCTS PO Take by mouth. CBD supplement 0.5 mg by mouth daily 1500 mg PRN  ?? OVER THE COUNTER MEDICATION CBD PM 1000 mg with Melatonin as needed for sleep  ?? TURMERIC CURCUMIN PO Take 750 mg by mouth daily.  ?? ZADITOR 0.025 %  ophthalmic solution Apply to eye.  ?? Continuous Blood Gluc Receiver (FREESTYLE LIBRE 2 READER) DEVI Use as directed to check sugars daily E11.65 (Patient not taking: Reported on 04/16/2021)  ?? Continuous Blood Gluc Sensor (FREESTYLE LIBRE 14 DAY SENSOR) MISC 1 Device by Does not apply route as directed. (Patient not taking: Reported on 04/16/2021)  ? ?No facility-administered encounter medications on file as of 04/16/2021.  ?  ?

## 2021-04-17 ENCOUNTER — Other Ambulatory Visit: Payer: Self-pay | Admitting: Family Medicine

## 2021-04-17 DIAGNOSIS — Z1231 Encounter for screening mammogram for malignant neoplasm of breast: Secondary | ICD-10-CM

## 2021-04-17 NOTE — Telephone Encounter (Signed)
Pt saw Dr Lorelei Pont on 04/16/21. ?

## 2021-04-18 ENCOUNTER — Encounter: Payer: Self-pay | Admitting: *Deleted

## 2021-05-02 ENCOUNTER — Encounter: Payer: Self-pay | Admitting: Pharmacist

## 2021-05-02 NOTE — Progress Notes (Signed)
[facility])  ?                                          [facility] Quality Pharmacy Team  ?                                      Statin Quality Measure Assessment ?  ? ?05/02/2021 ? ?Elizabeth Curtis Joshua ?1950/11/13 ?[id number] ? ?I am a [facility] clinical pharmacist that reviews patients for statin quality initiatives.    ? ?Per review of chart and payor information, patient has a diagnosis of diabetes but is not currently filling a statin prescription.  This places patient into the SUPD (Statin Use In Patients with Diabetes) measure for CMS.   ? ?Patient has documented trials of rosuvastatin and pravastatin with reported intolerance, but no corresponding CPT codes that would exclude patient from SUPD measure. ? ?The 10-year ASCVD risk score (Arnett DK, et al., 2019) is: 33.4% ?  Values used to calculate the score: ?    Age: 71 years ?    Sex: Female ?    Is Non-Hispanic African American: No ?    Diabetic: Yes ?    Tobacco smoker: No ?    Systolic Blood Pressure: 916 mmHg ?    Is BP treated: Yes ?    HDL Cholesterol: 45.5 mg/dL ?    Total Cholesterol: 187 mg/dL ?08/30/2020 ? ?   ?Component Value Date/Time  ? CHOL 187 08/30/2020 0815  ? TRIG 113.0 08/30/2020 0815  ? HDL 45.50 08/30/2020 0815  ? CHOLHDL 4 08/30/2020 0815  ? VLDL 22.6 08/30/2020 0815  ? LDLCALC 119 (H) 08/30/2020 0815  ? LDLDIRECT 122.0 12/23/2015 0906  ? ? ?Please consider ONE of the following recommendations:  ?  ?Initiate moderate intensity  ?        statin with reduced frequency if prior  ?        statin intolerance 1x weekly, #13, 3 refills  ? 2x weekly, #26, 3 refills  ? 3x weekly, #39, 3 refills  ?  ?Code for past statin intolerance or  other exclusions (required annually) ? ?Provider Requirements: ?Associate code during an office visit or telehealth encounter  Drug Induced Myopathy G72.0  ? Myopathy, unspecified G72.9  ? Myositis, unspecified M60.9  ? Rhabdomyolysis M62.82  ? Cirrhosis of liver K74.69  ?  PCOS E28.2  ? Toxic liver disease, unspecified K71.9  ? ? ?Loretha Brasil, PharmD ?[facility] ?Clinical Pharmacist ?Office: [phone] ? ?  ?

## 2021-05-06 ENCOUNTER — Ambulatory Visit (INDEPENDENT_AMBULATORY_CARE_PROVIDER_SITE_OTHER): Payer: PPO | Admitting: Family Medicine

## 2021-05-06 ENCOUNTER — Other Ambulatory Visit: Payer: Self-pay

## 2021-05-06 ENCOUNTER — Encounter: Payer: Self-pay | Admitting: Family Medicine

## 2021-05-06 VITALS — BP 138/84 | HR 83 | Temp 97.7°F | Ht 68.5 in | Wt 162.5 lb

## 2021-05-06 DIAGNOSIS — Z794 Long term (current) use of insulin: Secondary | ICD-10-CM

## 2021-05-06 DIAGNOSIS — K862 Cyst of pancreas: Secondary | ICD-10-CM | POA: Diagnosis not present

## 2021-05-06 DIAGNOSIS — E1169 Type 2 diabetes mellitus with other specified complication: Secondary | ICD-10-CM

## 2021-05-06 LAB — POCT GLYCOSYLATED HEMOGLOBIN (HGB A1C): Hemoglobin A1C: 7.2 % — AB (ref 4.0–5.6)

## 2021-05-06 NOTE — Assessment & Plan Note (Signed)
Congratulated on healthy diet choices and improved glycemic control. She is motivated to continue low sugar low carb diabetic diet. She has started MCT supplement as well. Continue current regimen. Reassess at CPE in 4 months.  ?

## 2021-05-06 NOTE — Progress Notes (Signed)
? ? Patient ID: Elizabeth Curtis, female    DOB: 1950-05-10, 71 y.o.   MRN: [id number] ? ?This visit was conducted in person. ? ?BP 138/84   Pulse 83   Temp 97.7 ?F (36.5 ?C) (Temporal)   Ht 5' 8.5" (1.74 m)   Wt 162 lb 8 oz (73.7 kg)   SpO2 98%   BMI 24.35 kg/m?   ? ?CC: DM f/u visit  ?Subjective:  ? ?HPI: ?Elizabeth Curtis is a 71 y.o. female presenting on 05/06/2021 for Diabetes (Here for f/u.) ? ? ?DM - does not regularly check sugars. Compliant with antihyperglycemic regimen which includes: metformin XR '500mg'$  in am and '1000mg'$  in pm, lantus 20u daily. She's cut out all sodas, artifical sweeteners and regular sugar. Denies low sugars or hypoglycemic symptoms. Denies paresthesias, blurry vision. Last diabetic eye exam 12/2020. Glucometer brand: never started freestyle libre 2. Last foot exam: 08/2020. DSME: completed remotely.  ?Lab Results  ?Component Value Date  ? HGBA1C 7.2 (A) 05/06/2021  ? ?Diabetic Foot Exam - Simple   ?Simple Foot Form ?Diabetic Foot exam was performed with the following findings: Yes 05/06/2021  8:26 AM  ?Visual Inspection ?No deformities, no ulcerations, no other skin breakdown bilaterally: Yes ?Sensation Testing ?Intact to touch and monofilament testing bilaterally: Yes ?Pulse Check ?Posterior Tibialis and Dorsalis pulse intact bilaterally: Yes ?Comments ?  ? ?Lab Results  ?Component Value Date  ? MICROALBUR <0.7 03/01/2015  ?  ?   ? ?Relevant past medical, surgical, family and social history reviewed and updated as indicated. Interim medical history since our last visit reviewed. ?Allergies and medications reviewed and updated. ?Outpatient Medications Prior to Visit  ?Medication Sig Dispense Refill  ? amLODipine (NORVASC) 5 MG tablet Take 1 tablet (5 mg total) by mouth at bedtime. 90 tablet 3  ? BD PEN NEEDLE NANO 2ND GEN 32G X 4 MM MISC USE AS DIRECTED ONCE DAILY 100 each 3  ? Cholecalciferol (VITAMIN D3 PO) Take 5,000 Units by mouth daily.    ? enalapril (VASOTEC) 20 MG tablet  Take 1 tablet (20 mg total) by mouth 2 (two) times daily. 180 tablet 3  ? glipiZIDE (GLUCOTROL) 5 MG tablet Take 1 tablet (5 mg total) by mouth 2 (two) times daily before a meal. 180 tablet 3  ? glucose blood (FREESTYLE LITE) test strip USE TO CHECK SUGAR ONCE DAILY AND AS NEEDED. DX:E11.65 100 each 3  ? insulin glargine (LANTUS SOLOSTAR) 100 UNIT/ML Solostar Pen Inject 20 Units into the skin daily. 15 mL 4  ? metFORMIN (GLUCOPHAGE-XR) 500 MG 24 hr tablet TAKE 1 (ONE) TABLET IN AM AND 2 (TWO) TABLETS IN PM WITH MEALS 270 tablet 3  ? MISC NATURAL PRODUCTS PO Take by mouth. CBD supplement 0.5 mg by mouth daily 1500 mg PRN    ? MISC NATURAL PRODUCTS PO Take by mouth. MCT Wellness    ? OVER THE COUNTER MEDICATION CBD PM 1000 mg with Melatonin as needed for sleep    ? TURMERIC CURCUMIN PO Take 750 mg by mouth daily.    ? ZADITOR 0.025 % ophthalmic solution Apply to eye.    ? Continuous Blood Gluc Receiver (FREESTYLE LIBRE 2 READER) DEVI Use as directed to check sugars daily E11.65 (Patient not taking: Reported on 04/16/2021) 1 each 3  ? Continuous Blood Gluc Sensor (FREESTYLE LIBRE 14 DAY SENSOR) MISC 1 Device by Does not apply route as directed. (Patient not taking: Reported on 04/16/2021) 2 each 3  ? Calcium Carbonate-Vitamin  D (CALCIUM-VITAMIN D3 PO) Take by mouth. 1200 mg of calcium; 1000 units of Vit D3    ? Magnesium 250 MG TABS Take by mouth.    ? ?No facility-administered medications prior to visit.  ?  ? ?Per HPI unless specifically indicated in ROS section below ?Review of Systems ? ?Objective:  ?BP 138/84   Pulse 83   Temp 97.7 ?F (36.5 ?C) (Temporal)   Ht 5' 8.5" (1.74 m)   Wt 162 lb 8 oz (73.7 kg)   SpO2 98%   BMI 24.35 kg/m?   ?Wt Readings from Last 3 Encounters:  ?05/06/21 162 lb 8 oz (73.7 kg)  ?04/16/21 169 lb 2 oz (76.7 kg)  ?12/13/20 167 lb (75.8 kg)  ?  ?  ?Physical Exam ?Vitals and nursing note reviewed.  ?Constitutional:   ?   Appearance: Normal appearance. She is not ill-appearing.  ?Eyes:  ?    Extraocular Movements: Extraocular movements intact.  ?   Conjunctiva/sclera: Conjunctivae normal.  ?   Pupils: Pupils are equal, round, and reactive to light.  ?Cardiovascular:  ?   Rate and Rhythm: Normal rate and regular rhythm.  ?   Pulses: Normal pulses.  ?   Heart sounds: Normal heart sounds. No murmur heard. ?Pulmonary:  ?   Effort: Pulmonary effort is normal. No respiratory distress.  ?   Breath sounds: Normal breath sounds. No wheezing, rhonchi or rales.  ?Musculoskeletal:  ?   Right lower leg: No edema.  ?   Left lower leg: No edema.  ?Skin: ?   General: Skin is warm and dry.  ?   Findings: No rash.  ?Neurological:  ?   Mental Status: She is alert.  ?Psychiatric:     ?   Mood and Affect: Mood normal.     ?   Behavior: Behavior normal.  ? ?   ?Results for orders placed or performed in visit on 05/06/21  ?POCT glycosylated hemoglobin (Hb A1C)  ?Result Value Ref Range  ? Hemoglobin A1C 7.2 (A) 4.0 - 5.6 %  ? HbA1c POC (<> result, manual entry)    ? HbA1c, POC (prediabetic range)    ? HbA1c, POC (controlled diabetic range)    ? ?No results found for: VITAMINB12  ? ?Assessment & Plan:  ?This visit occurred during the SARS-CoV-2 public health emergency.  Safety protocols were in place, including screening questions prior to the visit, additional usage of staff PPE, and extensive cleaning of exam room while observing appropriate contact time as indicated for disinfecting solutions.  ? ?Problem List Items Addressed This Visit   ? ? Type 2 diabetes mellitus with other specified complication ([place]) - Primary  ?  Congratulated on healthy diet choices and improved glycemic control. She is motivated to continue low sugar low carb diabetic diet. She has started MCT supplement as well. Continue current regimen. Reassess at CPE in 4 months.  ?  ?  ? Relevant Orders  ? POCT glycosylated hemoglobin (Hb A1C) (Completed)  ? Pancreatic cyst  ?  Followed by GI - appreciate GI care. Pending MRI later this year.  ?  ?  ?  ? ?No  orders of the defined types were placed in this encounter. ? ?Orders Placed This Encounter  ?Procedures  ? POCT glycosylated hemoglobin (Hb A1C)  ? ? ? ?Patient Instructions  ?You are doing well today. ?Continue metformin and lantus.  ?Return as needed or in 4 months for physical/wellness visit.  ? ?Follow up plan: ?Return in  about 4 months (around 09/05/2021) for annual exam, prior fasting for blood work, medicare wellness visit. ? ?Ria Bush, MD   ?

## 2021-05-06 NOTE — Assessment & Plan Note (Signed)
Followed by GI - appreciate GI care. Pending MRI later this year.  ?

## 2021-05-06 NOTE — Patient Instructions (Addendum)
You are doing well today. ?Continue metformin and lantus.  ?Return as needed or in 4 months for physical/wellness visit.  ?

## 2021-05-12 DIAGNOSIS — M6281 Muscle weakness (generalized): Secondary | ICD-10-CM | POA: Diagnosis not present

## 2021-05-12 DIAGNOSIS — M25551 Pain in right hip: Secondary | ICD-10-CM | POA: Diagnosis not present

## 2021-05-12 DIAGNOSIS — M25561 Pain in right knee: Secondary | ICD-10-CM | POA: Diagnosis not present

## 2021-05-14 ENCOUNTER — Other Ambulatory Visit: Payer: Self-pay

## 2021-05-14 DIAGNOSIS — F410 Panic disorder [episodic paroxysmal anxiety] without agoraphobia: Secondary | ICD-10-CM

## 2021-05-14 MED ORDER — LORAZEPAM 1 MG PO TABS: ORAL_TABLET | ORAL | 0 refills | Status: DC

## 2021-05-15 DIAGNOSIS — M25561 Pain in right knee: Secondary | ICD-10-CM | POA: Diagnosis not present

## 2021-05-15 DIAGNOSIS — M6281 Muscle weakness (generalized): Secondary | ICD-10-CM | POA: Diagnosis not present

## 2021-05-15 DIAGNOSIS — M25551 Pain in right hip: Secondary | ICD-10-CM | POA: Diagnosis not present

## 2021-05-20 ENCOUNTER — Encounter: Payer: Self-pay | Admitting: Family Medicine

## 2021-05-21 DIAGNOSIS — M25551 Pain in right hip: Secondary | ICD-10-CM | POA: Diagnosis not present

## 2021-05-21 DIAGNOSIS — M6281 Muscle weakness (generalized): Secondary | ICD-10-CM | POA: Diagnosis not present

## 2021-05-21 DIAGNOSIS — M25561 Pain in right knee: Secondary | ICD-10-CM | POA: Diagnosis not present

## 2021-05-23 DIAGNOSIS — M25551 Pain in right hip: Secondary | ICD-10-CM | POA: Diagnosis not present

## 2021-05-23 DIAGNOSIS — M6281 Muscle weakness (generalized): Secondary | ICD-10-CM | POA: Diagnosis not present

## 2021-05-23 DIAGNOSIS — M25561 Pain in right knee: Secondary | ICD-10-CM | POA: Diagnosis not present

## 2021-05-26 DIAGNOSIS — M6281 Muscle weakness (generalized): Secondary | ICD-10-CM | POA: Diagnosis not present

## 2021-05-26 DIAGNOSIS — M25561 Pain in right knee: Secondary | ICD-10-CM | POA: Diagnosis not present

## 2021-05-26 DIAGNOSIS — M25551 Pain in right hip: Secondary | ICD-10-CM | POA: Diagnosis not present

## 2021-05-28 ENCOUNTER — Encounter: Payer: Self-pay | Admitting: Family Medicine

## 2021-05-28 ENCOUNTER — Ambulatory Visit (INDEPENDENT_AMBULATORY_CARE_PROVIDER_SITE_OTHER): Payer: PPO | Admitting: Family Medicine

## 2021-05-28 VITALS — BP 130/80 | HR 78 | Temp 98.4°F | Ht 68.5 in | Wt 163.2 lb

## 2021-05-28 DIAGNOSIS — M25561 Pain in right knee: Secondary | ICD-10-CM | POA: Diagnosis not present

## 2021-05-28 DIAGNOSIS — M1711 Unilateral primary osteoarthritis, right knee: Secondary | ICD-10-CM | POA: Diagnosis not present

## 2021-05-28 NOTE — Progress Notes (Signed)
? ? ?Deagan Sevin T. Skyelar Swigart, MD, [facility] ?Therapist, music at [facility] ?[place] ?[place], [address] ? ?Phone: [phone]  FAX: [phone] ? ?[name] - 71 y.o. female  MRN [id number]  Date of Birth: [birth year] ? ?Date: 05/28/2021  PCP: Ria Bush, MD  Referral: Ria Bush, MD ? ?Chief Complaint  ?Patient presents with  ? Follow-up  ?  Right Knee  ? ? ?This visit occurred during the SARS-CoV-2 public health emergency.  Safety protocols were in place, including screening questions prior to the visit, additional usage of staff PPE, and extensive cleaning of exam room while observing appropriate contact time as indicated for disinfecting solutions.  ? ?Subjective:  ? ?Elizabeth Curtis is a 71 y.o. very pleasant female patient with Body mass index is 24.45 kg/m?. who presents with the following: ? ?She is here for 6-week follow-up for ongoing right-sided knee pain.  Injury mechanism as described below. ? ?She has been to PT, and she does have mild OA. ? ?Walked 1/2 mile yesterday. ?She has globally better than she was the first time that I saw her. ? ?She had been doing quite well, and she did have some pain after standing on her knee for roughly 3 hours at home show. ? ?She has been doing her physical therapy quite diligently twice a week, and she has also been doing some home rehab in addition.  She feels like she has gotten stronger, walking better, balance is better.  She does have some persistent hip pain, and physical therapy also noted this as well as myself. ? ?04/16/2021 Last OV with Owens Loffler, MD  ?Knee pain, R: Patient describes right-sided medial knee pain that occurred roughly 6 weeks ago when she was standing at the end of her slipper foot and she felt a distinct injury to the medial aspect of her knee.  Since then she has had some knee pain, and with exercise and weightbearing this has worsened and caused her some pain. ? ?She does not  have any history of major knee injury on the right side, no fractures or operative interventions.  She did have a left-sided what sounds like a partial meniscectomy and chondroplasty done by Dr. Noemi Chapel on the left side 10 years ago.  She does tell me that she had some arthritic change in this knee even then. ?  ?She has tried some basic over-the-counter remedies, and what sounds like topical Pennsaid as well as CBD.  She does get a rash with the topical anti-inflammatories. ?  ?Prior to this injury she was easily able to walk a mile to 1-1/2 miles. ?  ?She does have some pain getting in and out of a car ? ? ?Review of Systems is noted in the HPI, as appropriate  ? ?Objective:  ? ?BP 130/80   Pulse 78   Temp 98.4 ?F (36.9 ?C) (Oral)   Ht 5' 8.5" (1.74 m)   Wt 163 lb 3 oz (74 kg)   SpO2 96%   BMI 24.45 kg/m?  ? ? ?Knee: Right ?Gait: Normal heel toe pattern ?ROM: 0-1 30 ?Effusion: neg ?Echymosis or edema: none ?Patellar tendon NT ?Painful PLICA: neg ?Patellar grind: negative ?Medial and lateral patellar facet loading: negative ?medial and lateral joint lines:NT ?Mcmurray's neg ?Flexion-pinch neg ?Varus and valgus stress: stable ?Lachman: neg ?Ant and Post drawer: neg ?Hip abduction, IR, ER: WNL ?Hip flexion str: 4-/5 ?Abduction: 4/5 ?Hip abd: 4/5 ?Quad: 5/5 ?VMO atrophy:No ?Hamstring concentric and  eccentric: 5/5  ? ?Radiology: ?DG Knee 4 Views W/Patella Right ? ?Result Date: 04/16/2021 ?CLINICAL DATA:  Acute medial right knee pain. Assess degenerative changes. EXAM: RIGHT KNEE - COMPLETE 4+ VIEW COMPARISON:  None. FINDINGS: Right knee: There is diffuse decreased bone mineralization, appearing greater within the right knee compared to the left. Mild medial compartment joint space narrowing. Mild-to-moderate patella alta. Mild superior and moderate lateral greater than medial patellar degenerative osteophytosis. Mild-to-moderate lateral patellofemoral joint space narrowing. On frontal view of the left knee there is  mild medial compartment joint space narrowing. IMPRESSION:: IMPRESSION: 1. Right knee mild-to-moderate patellofemoral and mild medial compartment osteoarthritis. 2. Left knee mild medial compartment osteoarthritis. 3. Decreased bone mineralization, greater within the right leg compared to the left leg. Question disuse osteopenia. Electronically Signed   By: Yvonne Kendall M.D.   On: 04/16/2021 19:08    ? ?Assessment and Plan:  ? ?  ICD-10-CM   ?1. Acute pain of right knee  M25.561   ?  ?2. Primary osteoarthritis of right knee  M17.11   ?  ? ?Acute knee pain is resolving.  Done well with basic conservative care, and physical therapy.  Good compliance and good home rehab. ? ?She can only follow-up with me as needed. ? ?Dragon Medical One speech-to-text software was used for transcription in this dictation.  Possible transcriptional errors can occur using Editor, commissioning.  ? ?Signed, ? ?Turner Baillie T. Danyah Guastella, MD ? ? ?Outpatient Encounter Medications as of 05/28/2021  ?Medication Sig  ? amLODipine (NORVASC) 5 MG tablet Take 1 tablet (5 mg total) by mouth at bedtime.  ? BD PEN NEEDLE NANO 2ND GEN 32G X 4 MM MISC USE AS DIRECTED ONCE DAILY  ? Cholecalciferol (VITAMIN D3 PO) Take 5,000 Units by mouth daily.  ? Continuous Blood Gluc Receiver (FREESTYLE LIBRE 2 READER) DEVI Use as directed to check sugars daily E11.65  ? Continuous Blood Gluc Sensor (FREESTYLE LIBRE 14 DAY SENSOR) MISC 1 Device by Does not apply route as directed.  ? enalapril (VASOTEC) 20 MG tablet Take 1 tablet (20 mg total) by mouth 2 (two) times daily.  ? glipiZIDE (GLUCOTROL) 5 MG tablet Take 1 tablet (5 mg total) by mouth 2 (two) times daily before a meal.  ? glucose blood (FREESTYLE LITE) test strip USE TO CHECK SUGAR ONCE DAILY AND AS NEEDED. DX:E11.65  ? insulin glargine (LANTUS SOLOSTAR) 100 UNIT/ML Solostar Pen Inject 20 Units into the skin daily.  ? LORazepam (ATIVAN) 1 MG tablet Take 1 hour prior to MRI/MRCP  ? metFORMIN (GLUCOPHAGE-XR) 500 MG 24 hr  tablet TAKE 1 (ONE) TABLET IN AM AND 2 (TWO) TABLETS IN PM WITH MEALS  ? MISC NATURAL PRODUCTS PO Take by mouth. CBD supplement 0.5 mg by mouth daily 1500 mg PRN  ? MISC NATURAL PRODUCTS PO Take by mouth. MCT Wellness  ? OVER THE COUNTER MEDICATION CBD PM 1000 mg with Melatonin as needed for sleep  ? TURMERIC CURCUMIN PO Take 750 mg by mouth daily.  ? ZADITOR 0.025 % ophthalmic solution Apply to eye.  ? ?No facility-administered encounter medications on file as of 05/28/2021.  ?  ?

## 2021-05-29 DIAGNOSIS — M25551 Pain in right hip: Secondary | ICD-10-CM | POA: Diagnosis not present

## 2021-05-29 DIAGNOSIS — M6281 Muscle weakness (generalized): Secondary | ICD-10-CM | POA: Diagnosis not present

## 2021-05-29 DIAGNOSIS — M25561 Pain in right knee: Secondary | ICD-10-CM | POA: Diagnosis not present

## 2021-06-02 ENCOUNTER — Ambulatory Visit
Admission: RE | Admit: 2021-06-02 | Discharge: 2021-06-02 | Disposition: A | Discharge status: Home / Self Care | Payer: PPO | Source: Ambulatory Visit | Attending: Family Medicine | Admitting: Family Medicine

## 2021-06-02 DIAGNOSIS — Z1231 Encounter for screening mammogram for malignant neoplasm of breast: Secondary | ICD-10-CM | POA: Insufficient documentation

## 2021-06-02 IMAGING — MG MM DIGITAL SCREENING BILAT W/ TOMO AND CAD
8 series · 8 of 24 positions shown · non-contrast
Comparison: Previous exam(s).

CLINICAL DATA: Screening.

EXAM:
DIGITAL SCREENING BILATERAL MAMMOGRAM WITH TOMOSYNTHESIS AND CAD
TECHNIQUE: Bilateral screening digital craniocaudal and mediolateral oblique
mammograms were obtained. Bilateral screening digital breast
tomosynthesis was performed. The images were evaluated with
computer-aided detection.

[R MLO synth-2D]
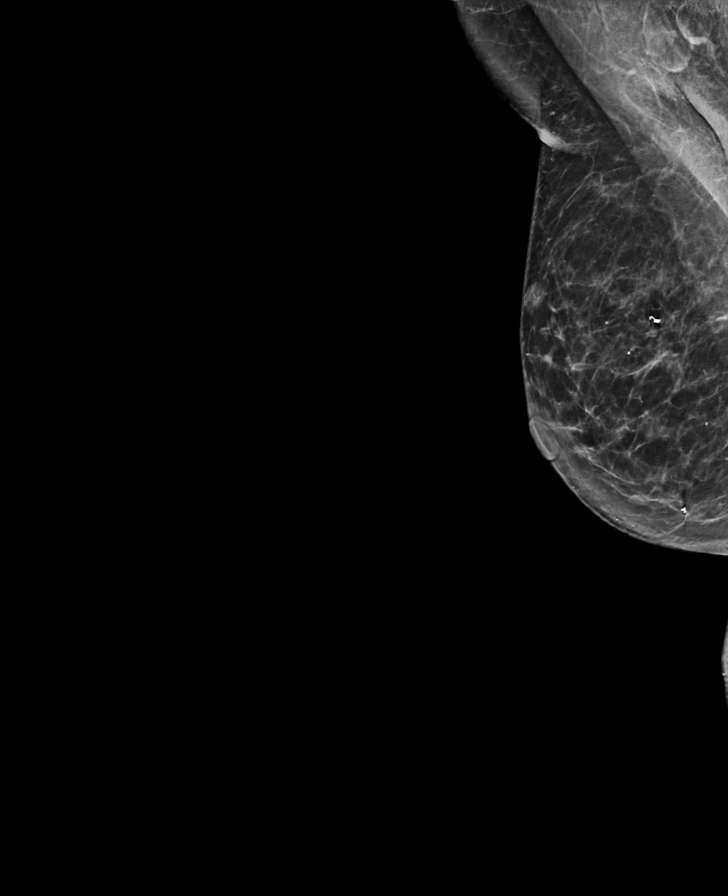

[R CC synth-2D]
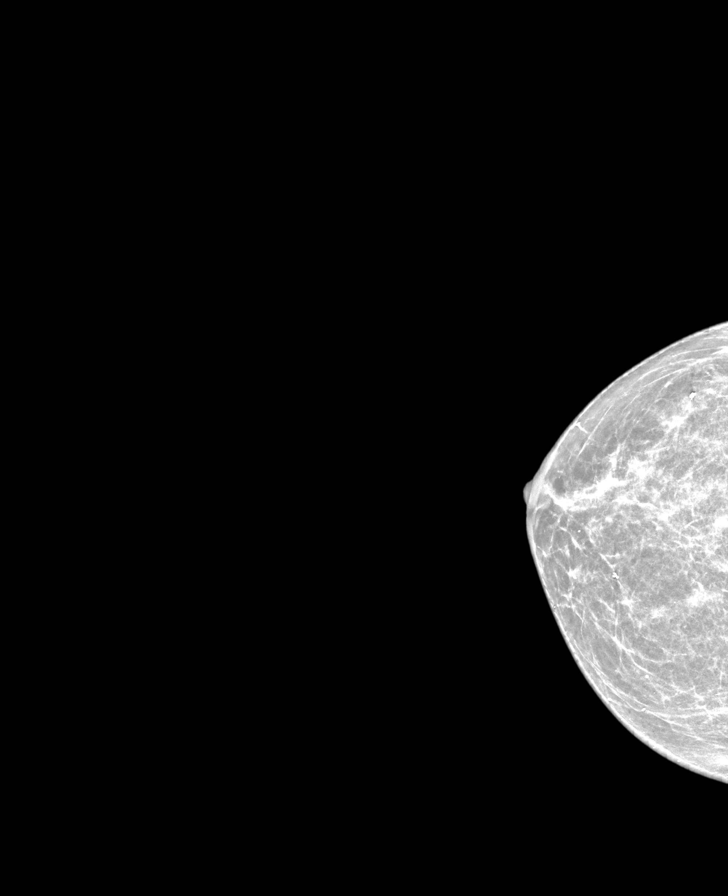

[L CC synth-2D]
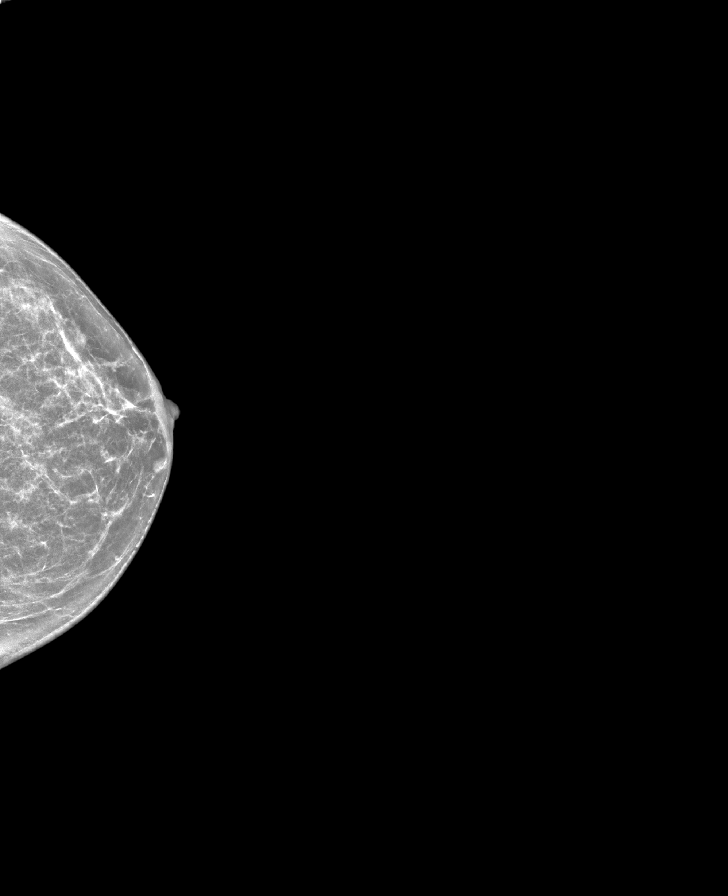

[L MLO synth-2D]
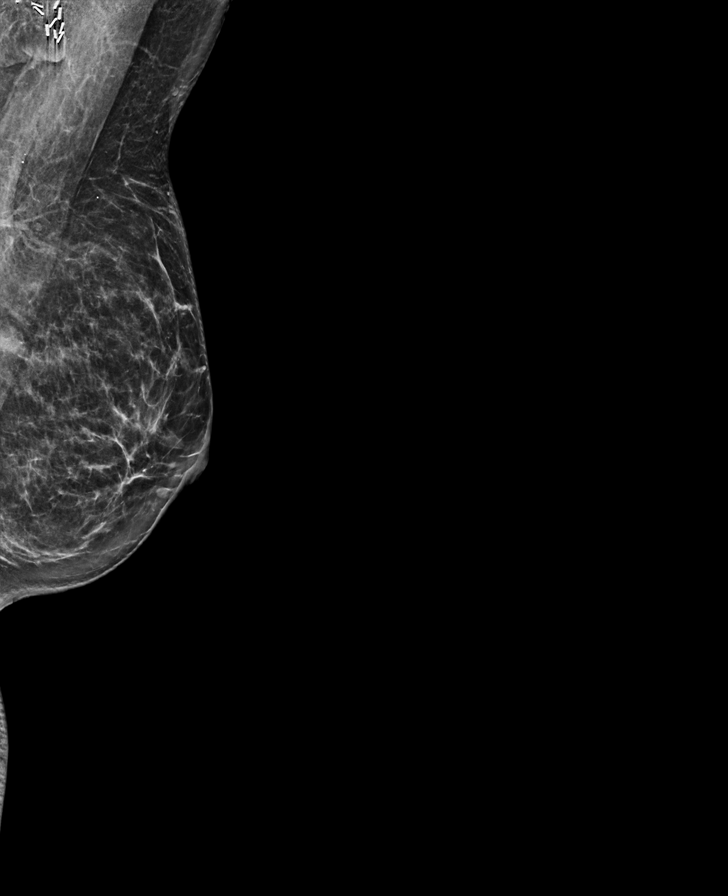

[L CC tomo · tomo slice 27/54.0]
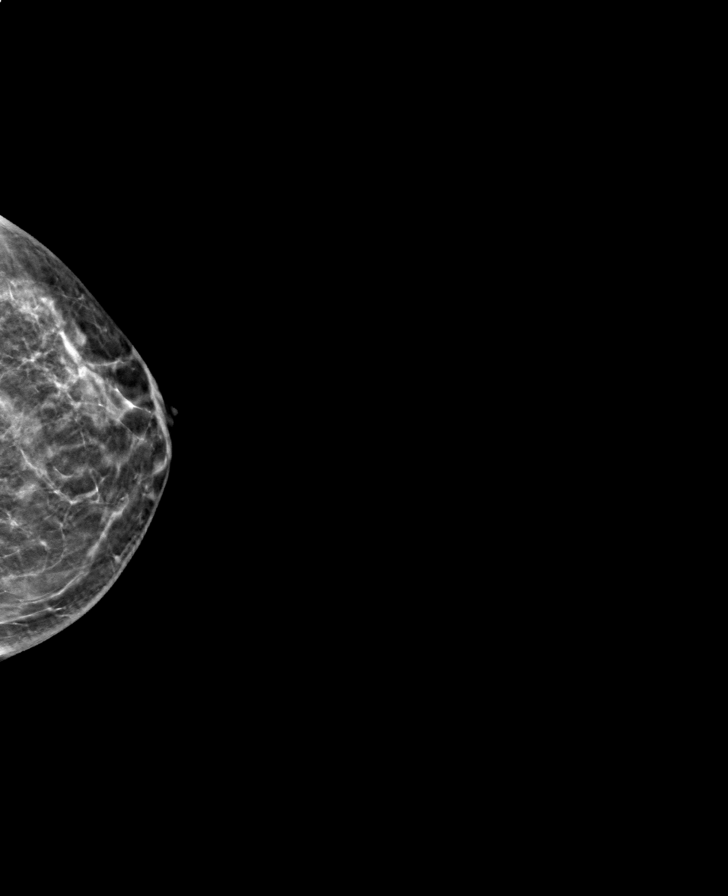

[R CC tomo · tomo slice 24/47.0]
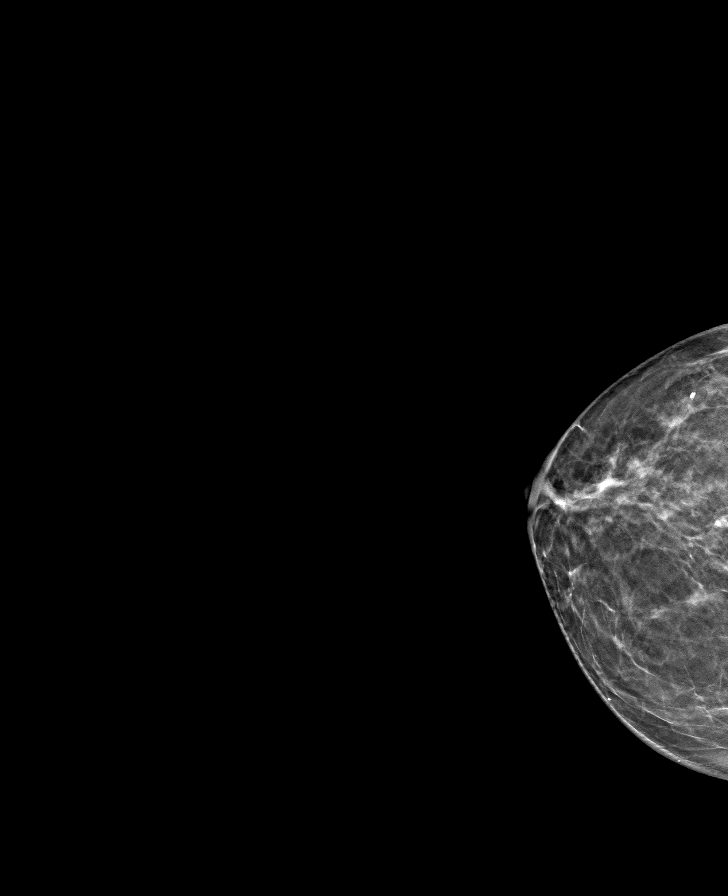

[R MLO tomo · tomo slice 38/75.0]
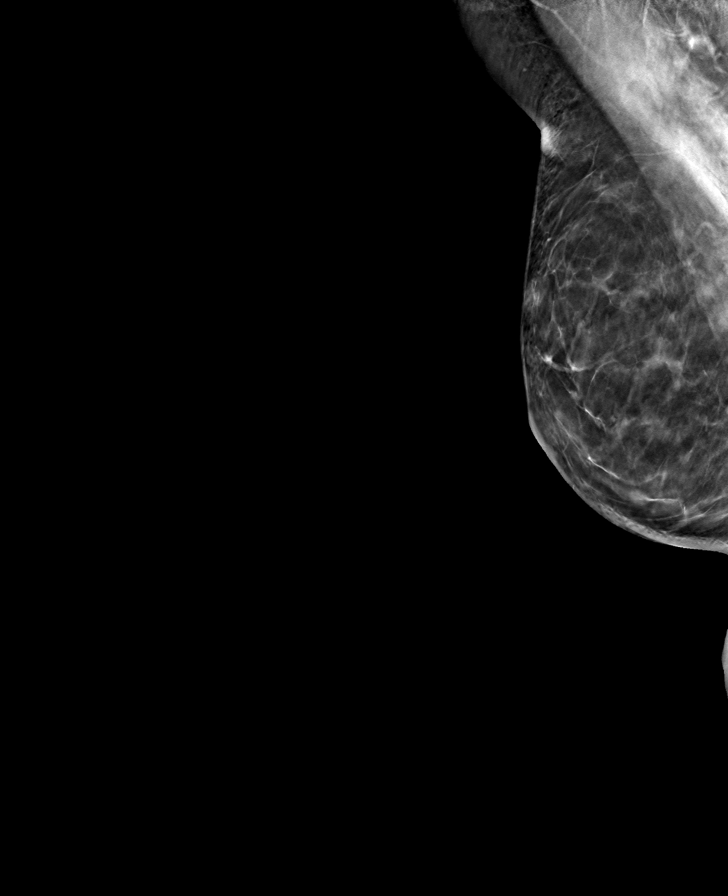

[L MLO tomo · tomo slice 35/70.0]
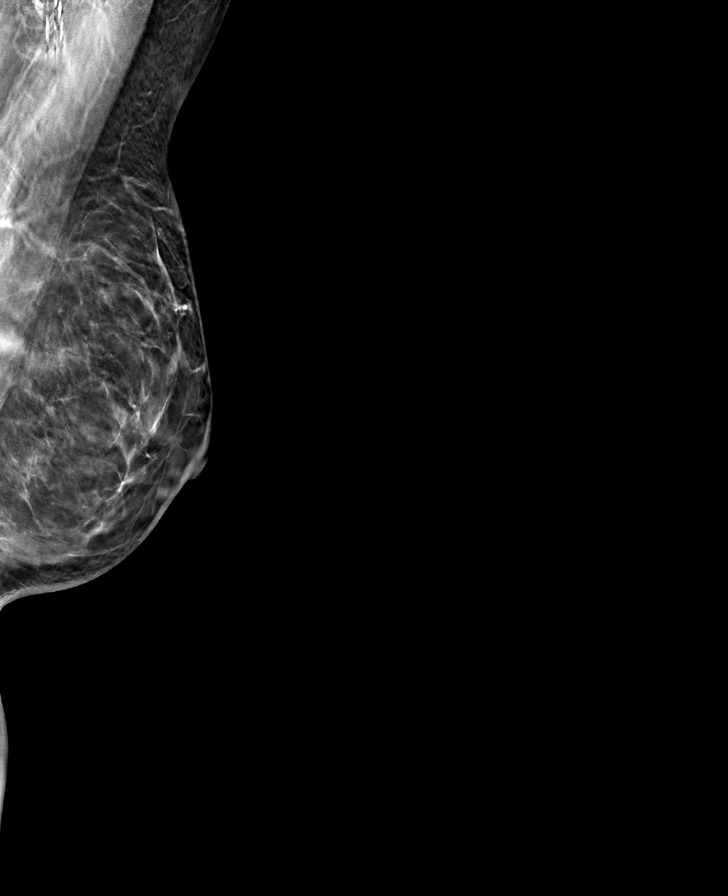

[8 of 24 positions shown; findings below may reference images not displayed]

ACR Breast Density Category b: There are scattered areas of
fibroglandular density.
FINDINGS: There are no findings suspicious for malignancy.
IMPRESSION: No mammographic evidence of malignancy. A result letter of this
screening mammogram will be mailed directly to the patient.

RECOMMENDATION:
Screening mammogram in one year. (Code:[BY])

BI-RADS CATEGORY  1: Negative.

## 2021-06-04 ENCOUNTER — Ambulatory Visit: Payer: PPO | Admitting: Family Medicine

## 2021-06-04 ENCOUNTER — Other Ambulatory Visit (HOSPITAL_COMMUNITY)
Admission: RE | Admit: 2021-06-04 | Discharge: 2021-06-04 | Disposition: A | Discharge status: Home / Self Care | Payer: PPO | Source: Ambulatory Visit | Attending: Family Medicine | Admitting: Family Medicine

## 2021-06-04 ENCOUNTER — Encounter: Payer: Self-pay | Admitting: Family Medicine

## 2021-06-04 VITALS — BP 145/77 | HR 74 | Ht 69.0 in | Wt 160.0 lb

## 2021-06-04 DIAGNOSIS — Z124 Encounter for screening for malignant neoplasm of cervix: Secondary | ICD-10-CM | POA: Insufficient documentation

## 2021-06-04 DIAGNOSIS — Z1151 Encounter for screening for human papillomavirus (HPV): Secondary | ICD-10-CM | POA: Diagnosis not present

## 2021-06-04 DIAGNOSIS — Z01411 Encounter for gynecological examination (general) (routine) with abnormal findings: Secondary | ICD-10-CM

## 2021-06-04 NOTE — Progress Notes (Signed)
VJK:QASU Menopause  ?Last Pap:2020 WNL  ?STD Screening:No ?Family Hx of Breast Cancer:Yes Maternal Aunt/Paternal Aunt  ?Family Hx of Ovarian Cancer:No ?Last Mammogram:06/02/21 WNL repeat in 1 yr.  ? ?CC: None  ?

## 2021-06-04 NOTE — Progress Notes (Signed)
Subjective:  ?  ? Elizabeth Curtis is a 71 y.o. female and is here for a comprehensive physical exam. The patient reports no problems. ? ?The following portions of the patient's history were reviewed and updated as appropriate: allergies, current medications, past family history, past medical history, past social history, past surgical history, and problem list. ? ?Review of Systems ?Pertinent items noted in HPI and remainder of comprehensive ROS otherwise negative.  ? ?Objective:  ? ? BP (!) 145/77   Pulse 74   Ht '5\' 9"'$  (1.753 m)   Wt 160 lb (72.6 kg)   BMI 23.63 kg/m?  ?General appearance: alert, cooperative, and appears stated age ?Head: Normocephalic, without obvious abnormality, atraumatic ?Neck: no adenopathy, supple, symmetrical, trachea midline, and thyroid not enlarged, symmetric, no tenderness/mass/nodules ?Lungs: clear to auscultation bilaterally ?Heart: regular rate and rhythm, S1, S2 normal, no murmur, click, rub or gallop ?Abdomen: soft, non-tender; bowel sounds normal; no masses,  no organomegaly ?Pelvic: cervix normal in appearance, external genitalia normal, no adnexal masses or tenderness, no cervical motion tenderness, uterus normal size, shape, and consistency, and vaginal atrophy ?Extremities: extremities normal, atraumatic, no cyanosis or edema ?Pulses: 2+ and symmetric ?Skin: Skin color, texture, turgor normal. No rashes or lesions ?Lymph nodes: Cervical, supraclavicular, and axillary nodes normal. ?Neurologic: Grossly normal  ?  ?Assessment:  ? ? GYN female exam.    ?  ?Plan:  ?Encounter for gynecological examination with abnormal finding ? ?Screening for cervical cancer - Plan: Cytology - PAP( [facility]) ? ?Return in 2 years (on 06/05/2023). ? ?  ?See After Visit Summary for Counseling Recommendations  ? ?

## 2021-06-05 DIAGNOSIS — M25551 Pain in right hip: Secondary | ICD-10-CM | POA: Diagnosis not present

## 2021-06-05 DIAGNOSIS — M6281 Muscle weakness (generalized): Secondary | ICD-10-CM | POA: Diagnosis not present

## 2021-06-05 DIAGNOSIS — M25561 Pain in right knee: Secondary | ICD-10-CM | POA: Diagnosis not present

## 2021-06-06 LAB — CYTOLOGY - PAP
Comment: NEGATIVE
Diagnosis: NEGATIVE
High risk HPV: NEGATIVE

## 2021-06-09 ENCOUNTER — Ambulatory Visit
Admission: RE | Admit: 2021-06-09 | Discharge: 2021-06-09 | Disposition: A | Discharge status: Home / Self Care | Payer: PPO | Source: Ambulatory Visit | Attending: Gastroenterology | Admitting: Gastroenterology

## 2021-06-09 DIAGNOSIS — R935 Abnormal findings on diagnostic imaging of other abdominal regions, including retroperitoneum: Secondary | ICD-10-CM | POA: Insufficient documentation

## 2021-06-09 DIAGNOSIS — K862 Cyst of pancreas: Secondary | ICD-10-CM | POA: Diagnosis not present

## 2021-06-09 DIAGNOSIS — K8020 Calculus of gallbladder without cholecystitis without obstruction: Secondary | ICD-10-CM | POA: Diagnosis not present

## 2021-06-09 IMAGING — MR MR ABDOMEN WO/W CM MRCP
19 of 22 series · 44 of 48 positions shown · IV contrast (7 ml Gadavist)
Comparison: [DATE].

CLINICAL DATA: A 70-year-old female presents for follow-up of
pancreatic cyst.

EXAM:
MRI ABDOMEN WITHOUT AND WITH CONTRAST (INCLUDING MRCP)
TECHNIQUE: Multiplanar multisequence MR imaging of the abdomen was performed
both before and after the administration of intravenous contrast.
Heavily T2-weighted images of the biliary and pancreatic ducts were
obtained, and three-dimensional MRCP images were rendered by post
processing.
CONTRAST:  7mL GADAVIST GADOBUTROL 1 MMOL/ML IV SOLN

[Series 5: T2 · coronal · 6.0mm · 1.19mm/px · 1 of 30 slices shown (1 of 2)]
[im 1/30]
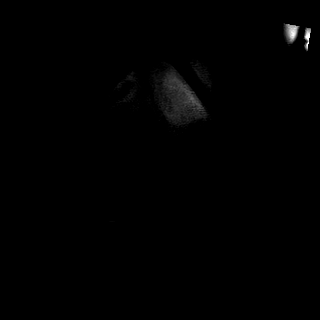

[Series 6: T2 · axial · 6.0mm · 1.19mm/px · 1 of 32 slices shown (2 of 2)]
[im 1/32]
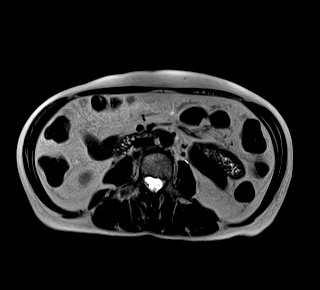

[Series 7: in & out · axial · 3.0mm · 1.19mm/px · z∈[-44,+193]mm · 2 of 80 slices shown (1 of 2)]
[im 1/80]
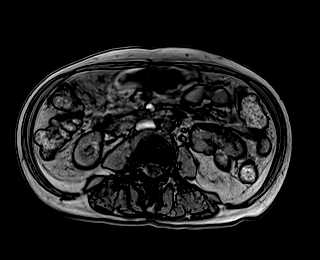
[im 80/80]
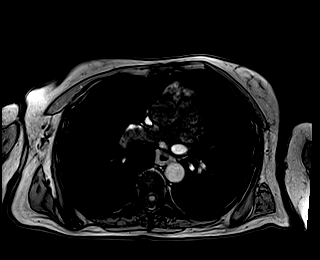

[Series 8: in & out · axial · 3.0mm · 1.19mm/px · z∈[-44,+193]mm · 2 of 80 slices shown (2 of 2)]
[im 1/80]
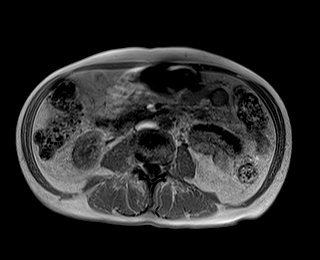
[im 80/80]
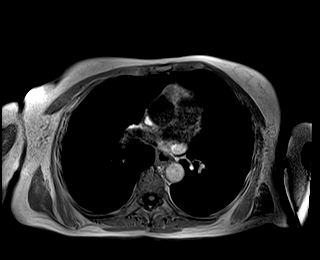

[Series 11: T2 fat-sat · axial · 6.0mm · 1.19mm/px · 1 of 34 slices shown]
[im 1/34]
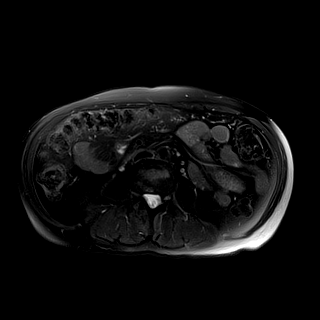

[Series 15: MRCP · coronal · 3.0mm · 1.12mm/px · 1 of 17 slices shown]
[im 1/17]
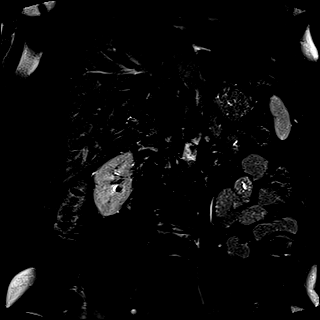

[Series 16: ax dwi_tracew · axial · 6.0mm · 1.42mm/px · z∈[-44,+194]mm · 4 of 102 slices shown]
[im 1/102]
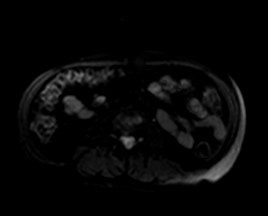
[im 34/102]
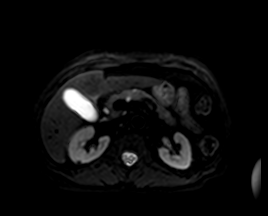
[im 68/102]
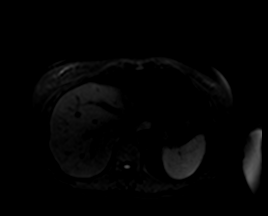
[im 102/102]
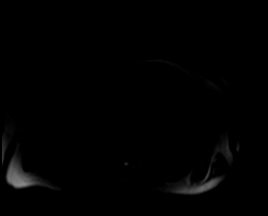

[Series 17: ax dwi_adc · axial · 6.0mm · 1.42mm/px · 1 of 34 slices shown]
[im 1/34]
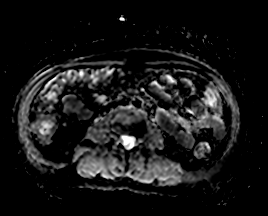

[Series 19: T1 dynamic fat-sat · axial · non-contrast · 3.0mm · 1.19mm/px · z∈[-44,+193]mm · 3 of 80 slices shown (1 of 5)]
[im 1/80]
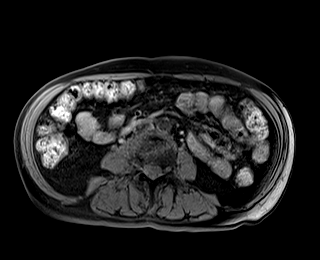
[im 40/80]
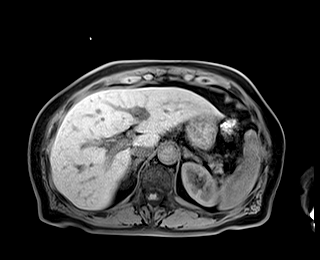
[im 80/80]
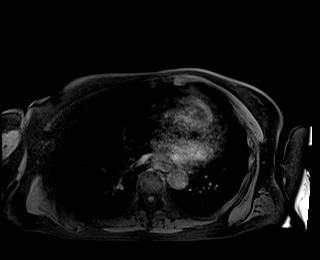

[Series 20: radials · coronal · 50.0mm · 0.78mm/px · 1 of 5 slices shown]
[im 1/5]
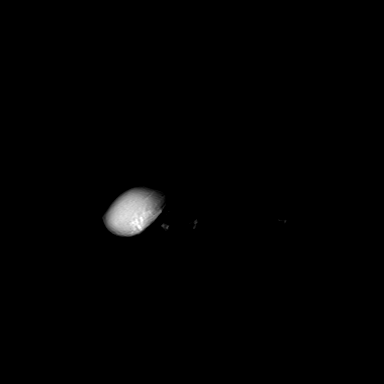

[Series 21: T1 dynamic fat-sat post-contrast · axial · 3.0mm · 1.19mm/px · z∈[-44,+193]mm · 3 of 80 slices shown (1 of 4)]
[im 1/80]
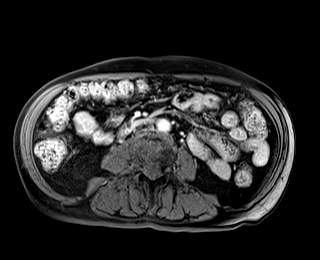
[im 40/80]
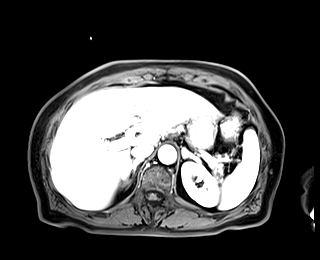
[im 80/80]
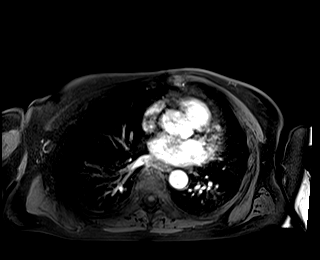

[Series 22: T1 dynamic fat-sat · axial · 3.0mm · 1.19mm/px · z∈[-44,+193]mm · 3 of 80 slices shown (2 of 5)]
[im 1/80]
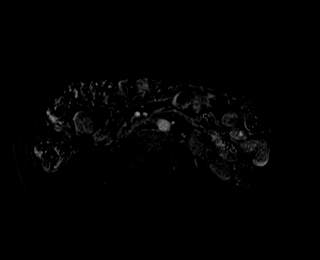
[im 40/80]
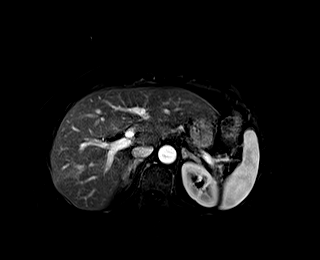
[im 80/80]
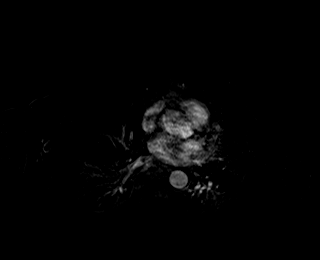

[Series 23: T1 dynamic fat-sat post-contrast · axial · 3.0mm · 1.19mm/px · z∈[-44,+193]mm · 3 of 80 slices shown (2 of 4)]
[im 1/80]
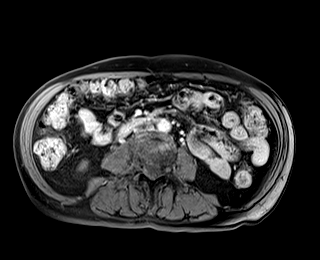
[im 40/80]
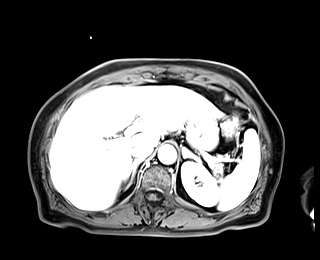
[im 80/80]
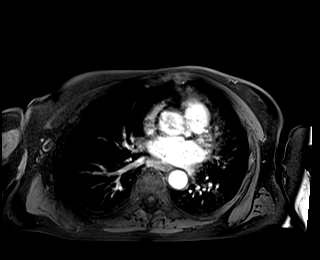

[Series 24: T1 dynamic fat-sat · axial · 3.0mm · 1.19mm/px · z∈[-44,+193]mm · 3 of 80 slices shown (3 of 5)]
[im 1/80]
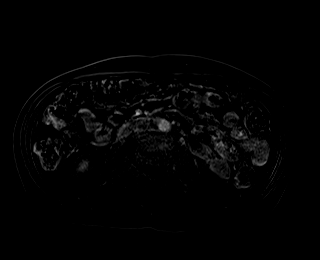
[im 40/80]
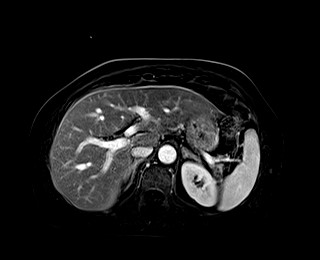
[im 80/80]
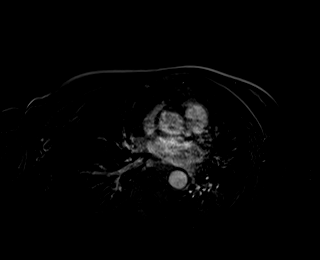

[Series 25: T1 dynamic fat-sat post-contrast · axial · 3.0mm · 1.19mm/px · z∈[-44,+193]mm · 3 of 80 slices shown (3 of 4)]
[im 1/80]
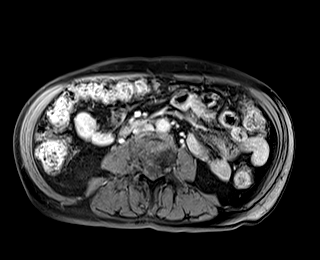
[im 40/80]
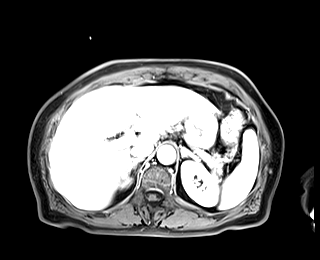
[im 80/80]
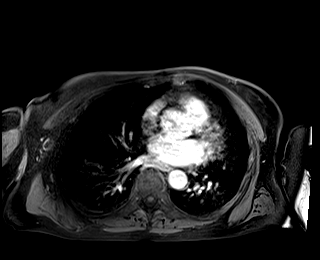

[Series 26: T1 dynamic fat-sat · axial · 3.0mm · 1.19mm/px · z∈[-44,+193]mm · 3 of 80 slices shown (4 of 5)]
[im 1/80]
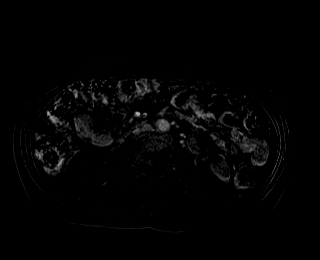
[im 40/80]
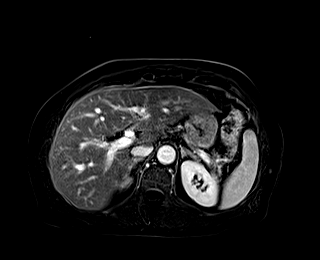
[im 80/80]
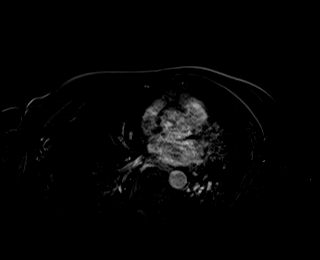

[Series 27: T1 dynamic post-contrast · coronal · 3.0mm · 1.31mm/px · 3 of 72 slices shown]
[im 1/72]
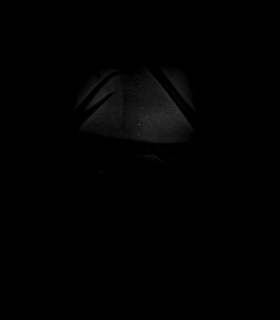
[im 36/72]
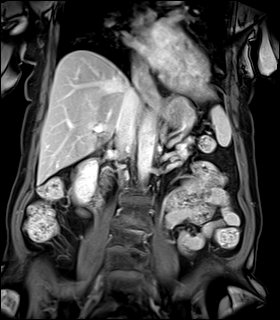
[im 72/72]
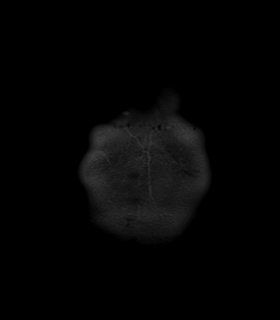

[Series 28: T1 dynamic fat-sat post-contrast · axial · 3.0mm · 1.19mm/px · z∈[-44,+193]mm · 3 of 80 slices shown (4 of 4)]
[im 1/80]
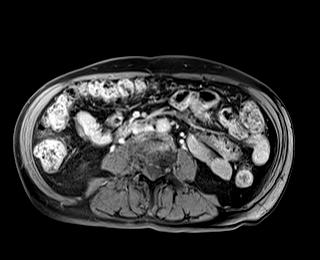
[im 40/80]
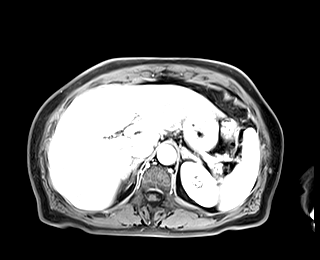
[im 80/80]
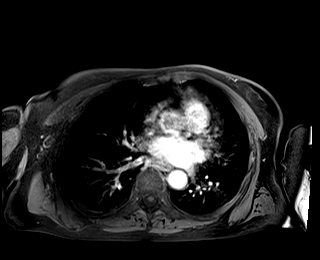

[Series 29: T1 dynamic fat-sat · axial · 3.0mm · 1.19mm/px · z∈[-44,+193]mm · 3 of 80 slices shown (5 of 5)]
[im 1/80]
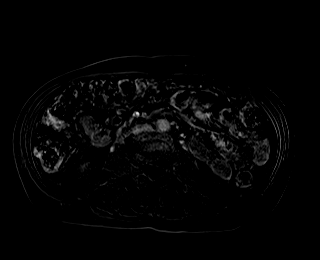
[im 40/80]
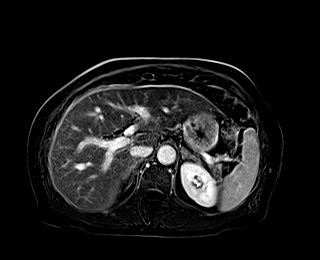
[im 80/80]
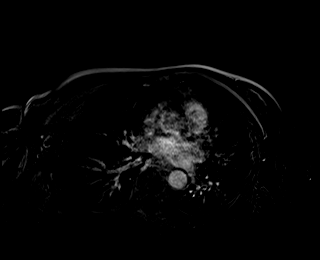

[44 of 48 positions shown; findings below may reference images not displayed]

FINDINGS: Lower chest: Lung bases without effusion or consolidation, limited
assessment on MRI.

Hepatobiliary: No signs of biliary duct dilation. Cholelithiasis
without pericholecystic stranding.

Liver with smooth contours, no substantial fat or iron in the liver.
No focal, suspicious hepatic lesion.

Pancreas: Pancreas with preserved intrinsic T1 signal. Cystic
pancreatic lesions are stable and without signs of main pancreatic
duct dilation. No signs of peripancreatic inflammation. No signs of
internal enhancement related to pancreatic lesions.

(Image 59/21) 2.2 cm cystic area in the neck of the pancreas is the
largest of visible pancreatic cystic lesions and displays no change.

Lesion in the tail of the pancreas (image 58/21) 1 cm cystic area
also stable as are smaller lesions elsewhere in the pancreas all
less than a cm

Spleen:  Normal.

Adrenals/Urinary Tract: Normal adrenal glands. No suspicious renal
lesion or hydronephrosis. No perinephric stranding.

Stomach/Bowel: Gastrointestinal tract unremarkable to the extent
evaluated on abdominal MR.

Vascular/Lymphatic: No pathologically enlarged lymph nodes
identified. No abdominal aortic aneurysm demonstrated.

Other:  None.

Musculoskeletal: No suspicious bone lesions identified.
IMPRESSION: 1. Stable appearance of cystic pancreatic lesions, largest of which
is 2.2 cm in the neck of the pancreas. Lesions likely reflect
intraductal papillary mucinous neoplasms. Ductal communication is
seen with many of these areas on the current study. At follow-up
MRI/MRCP is suggested at 1 year interval.
2. Cholelithiasis without evidence of biliary duct dilation or
choledocholithiasis.

## 2021-06-09 MED ORDER — GADOBUTROL 1 MMOL/ML IV SOLN
7.0000 mL | Freq: Once | INTRAVENOUS | Status: AC | PRN
  Administered 2021-06-09: 7 mL via INTRAVENOUS

## 2021-06-10 DIAGNOSIS — M25551 Pain in right hip: Secondary | ICD-10-CM | POA: Diagnosis not present

## 2021-06-10 DIAGNOSIS — M25561 Pain in right knee: Secondary | ICD-10-CM | POA: Diagnosis not present

## 2021-06-10 DIAGNOSIS — M6281 Muscle weakness (generalized): Secondary | ICD-10-CM | POA: Diagnosis not present

## 2021-06-11 ENCOUNTER — Other Ambulatory Visit: Payer: Self-pay

## 2021-06-11 DIAGNOSIS — K862 Cyst of pancreas: Secondary | ICD-10-CM

## 2021-08-04 ENCOUNTER — Other Ambulatory Visit: Payer: Self-pay | Admitting: Family Medicine

## 2021-08-14 ENCOUNTER — Encounter: Payer: Self-pay | Admitting: Family Medicine

## 2021-08-14 ENCOUNTER — Other Ambulatory Visit: Payer: Self-pay | Admitting: Family Medicine

## 2021-08-14 DIAGNOSIS — E1169 Type 2 diabetes mellitus with other specified complication: Secondary | ICD-10-CM

## 2021-08-14 MED ORDER — FREESTYLE LIBRE READER DEVI: 0 refills | Status: DC

## 2021-08-14 MED ORDER — FREESTYLE LIBRE 3 SENSOR MISC: 3 refills | Status: DC

## 2021-08-14 NOTE — Telephone Encounter (Signed)
E-scribed rx for [employer] sensors to [facility].

## 2021-08-15 NOTE — Telephone Encounter (Signed)
Received following message from [facility]: [place] PHONE.  Spoke with [facility] asking about message.  States they needed rx for [employer] reader.  I tried to order yesterday, but Epic would not let me order FreeStyle Libre 3 reader.   [facility]. Instructed me to deny request.

## 2021-08-31 ENCOUNTER — Other Ambulatory Visit: Payer: Self-pay | Admitting: Family Medicine

## 2021-08-31 DIAGNOSIS — M8589 Other specified disorders of bone density and structure, multiple sites: Secondary | ICD-10-CM

## 2021-08-31 DIAGNOSIS — E785 Hyperlipidemia, unspecified: Secondary | ICD-10-CM

## 2021-08-31 DIAGNOSIS — R946 Abnormal results of thyroid function studies: Secondary | ICD-10-CM

## 2021-08-31 DIAGNOSIS — E1169 Type 2 diabetes mellitus with other specified complication: Secondary | ICD-10-CM

## 2021-09-03 ENCOUNTER — Other Ambulatory Visit (INDEPENDENT_AMBULATORY_CARE_PROVIDER_SITE_OTHER): Payer: PPO

## 2021-09-03 DIAGNOSIS — M8589 Other specified disorders of bone density and structure, multiple sites: Secondary | ICD-10-CM

## 2021-09-03 DIAGNOSIS — R946 Abnormal results of thyroid function studies: Secondary | ICD-10-CM

## 2021-09-03 DIAGNOSIS — Z794 Long term (current) use of insulin: Secondary | ICD-10-CM

## 2021-09-03 DIAGNOSIS — E1169 Type 2 diabetes mellitus with other specified complication: Secondary | ICD-10-CM

## 2021-09-03 DIAGNOSIS — E785 Hyperlipidemia, unspecified: Secondary | ICD-10-CM | POA: Diagnosis not present

## 2021-09-03 LAB — COMPREHENSIVE METABOLIC PANEL
ALT: 11 U/L (ref 0–35)
AST: 10 U/L (ref 0–37)
Albumin: 4.3 g/dL (ref 3.5–5.2)
Alkaline Phosphatase: 53 U/L (ref 39–117)
BUN: 14 mg/dL (ref 6–23)
CO2: 31 mEq/L (ref 19–32)
Calcium: 9.4 mg/dL (ref 8.4–10.5)
Chloride: 103 mEq/L (ref 96–112)
Creatinine, Ser: 0.71 mg/dL (ref 0.40–1.20)
GFR: 85.41 mL/min (ref >60.00)
Glucose, Bld: 113 mg/dL — ABNORMAL HIGH (ref 70–99)
Potassium: 4.2 mEq/L (ref 3.5–5.1)
Sodium: 140 mEq/L (ref 135–145)
Total Bilirubin: 0.4 mg/dL (ref 0.2–1.2)
Total Protein: 6.6 g/dL (ref 6.0–8.3)

## 2021-09-03 LAB — LIPID PANEL
Cholesterol: 188 mg/dL (ref 0–200)
HDL: 50.2 mg/dL (ref >39.00)
LDL Cholesterol: 120 mg/dL — ABNORMAL HIGH (ref 0–99)
NonHDL: 137.38
Total CHOL/HDL Ratio: 4
Triglycerides: 88 mg/dL (ref 0.0–149.0)
VLDL: 17.6 mg/dL (ref 0.0–40.0)

## 2021-09-03 LAB — MICROALBUMIN / CREATININE URINE RATIO
Creatinine,U: 49 mg/dL
Microalb Creat Ratio: 1.4 mg/g (ref 0.0–30.0)
Microalb, Ur: <0.7 mg/dL (ref 0.0–1.9)

## 2021-09-03 LAB — TSH: TSH: 0.65 u[IU]/mL (ref 0.35–5.50)

## 2021-09-03 LAB — T4, FREE: Free T4: 0.95 ng/dL (ref 0.60–1.60)

## 2021-09-03 LAB — VITAMIN D 25 HYDROXY (VIT D DEFICIENCY, FRACTURES): VITD: 78.8 ng/mL (ref 30.00–100.00)

## 2021-09-03 LAB — HEMOGLOBIN A1C: Hgb A1c MFr Bld: 6.7 % — ABNORMAL HIGH (ref 4.6–6.5)

## 2021-09-04 ENCOUNTER — Ambulatory Visit (INDEPENDENT_AMBULATORY_CARE_PROVIDER_SITE_OTHER): Payer: PPO

## 2021-09-04 VITALS — Ht 69.0 in | Wt 156.0 lb

## 2021-09-04 DIAGNOSIS — Z0000 Encounter for general adult medical examination without abnormal findings: Secondary | ICD-10-CM | POA: Diagnosis not present

## 2021-09-04 NOTE — Patient Instructions (Signed)
Elizabeth Curtis , Thank you for taking time to come for your Medicare Wellness Visit. I appreciate your ongoing commitment to your health goals. Please review the following plan we discussed and let me know if I can assist you in the future.   Screening recommendations/referrals: Colonoscopy: completed 03/08/2020, due 03/09/2023 Mammogram: completed 06/02/2021, due 06/04/2022 Bone Density: completed 09/09/2020 Recommended yearly ophthalmology/optometry visit for glaucoma screening and checkup Recommended yearly dental visit for hygiene and checkup  Vaccinations: Influenza vaccine: due 09/09/2021 Pneumococcal vaccine: completed 03/07/2015 Tdap vaccine: due Shingles vaccine: discussed   Covid-19: 12/16/2020, 09/08/2020, 11/23/2019, 04/06/2019, 03/16/2019  Advanced directives: copy in chart  Conditions/risks identified: none  Next appointment: Follow up in one year for your annual wellness visit    Preventive Care 33 Years and Older, Female Preventive care refers to lifestyle choices and visits with your health care provider that can promote health and wellness. What does preventive care include? A yearly physical exam. This is also called an annual well check. Dental exams once or twice a year. Routine eye exams. Ask your health care provider how often you should have your eyes checked. Personal lifestyle choices, including: Daily care of your teeth and gums. Regular physical activity. Eating a healthy diet. Avoiding tobacco and drug use. Limiting alcohol use. Practicing safe sex. Taking low-dose aspirin every day. Taking vitamin and mineral supplements as recommended by your health care provider. What happens during an annual well check? The services and screenings done by your health care provider during your annual well check will depend on your age, overall health, lifestyle risk factors, and family history of disease. Counseling  Your health care provider may ask you questions about  your: Alcohol use. Tobacco use. Drug use. Emotional well-being. Home and relationship well-being. Sexual activity. Eating habits. History of falls. Memory and ability to understand (cognition). Work and work Statistician. Reproductive health. Screening  You may have the following tests or measurements: Height, weight, and BMI. Blood pressure. Lipid and cholesterol levels. These may be checked every 5 years, or more frequently if you are over 92 years old. Skin check. Lung cancer screening. You may have this screening every year starting at age 23 if you have a 30-pack-year history of smoking and currently smoke or have quit within the past 15 years. Fecal occult blood test (FOBT) of the stool. You may have this test every year starting at age 74. Flexible sigmoidoscopy or colonoscopy. You may have a sigmoidoscopy every 5 years or a colonoscopy every 10 years starting at age 81. Hepatitis C blood test. Hepatitis B blood test. Sexually transmitted disease (STD) testing. Diabetes screening. This is done by checking your blood sugar (glucose) after you have not eaten for a while (fasting). You may have this done every 1-3 years. Bone density scan. This is done to screen for osteoporosis. You may have this done starting at age 20. Mammogram. This may be done every 1-2 years. Talk to your health care provider about how often you should have regular mammograms. Talk with your health care provider about your test results, treatment options, and if necessary, the need for more tests. Vaccines  Your health care provider may recommend certain vaccines, such as: Influenza vaccine. This is recommended every year. Tetanus, diphtheria, and acellular pertussis (Tdap, Td) vaccine. You may need a Td booster every 10 years. Zoster vaccine. You may need this after age 50. Pneumococcal 13-valent conjugate (PCV13) vaccine. One dose is recommended after age 58. Pneumococcal polysaccharide (PPSV23) vaccine.  One dose  is recommended after age 16. Talk to your health care provider about which screenings and vaccines you need and how often you need them. This information is not intended to replace advice given to you by your health care provider. Make sure you discuss any questions you have with your health care provider. Document Released: 02/22/2015 Document Revised: 10/16/2015 Document Reviewed: 11/27/2014 Elsevier Interactive Patient Education  2017 [place] Prevention in the Home Falls can cause injuries. They can happen to people of all ages. There are many things you can do to make your home safe and to help prevent falls. What can I do on the outside of my home? Regularly fix the edges of walkways and driveways and fix any cracks. Remove anything that might make you trip as you walk through a door, such as a raised step or threshold. Trim any bushes or trees on the path to your home. Use bright outdoor lighting. Clear any walking paths of anything that might make someone trip, such as rocks or tools. Regularly check to see if handrails are loose or broken. Make sure that both sides of any steps have handrails. Any raised decks and porches should have guardrails on the edges. Have any leaves, snow, or ice cleared regularly. Use sand or salt on walking paths during winter. Clean up any spills in your garage right away. This includes oil or grease spills. What can I do in the bathroom? Use night lights. Install grab bars by the toilet and in the tub and shower. Do not use towel bars as grab bars. Use non-skid mats or decals in the tub or shower. If you need to sit down in the shower, use a plastic, non-slip stool. Keep the floor dry. Clean up any water that spills on the floor as soon as it happens. Remove soap buildup in the tub or shower regularly. Attach bath mats securely with double-sided non-slip rug tape. Do not have throw rugs and other things on the floor that can make  you trip. What can I do in the bedroom? Use night lights. Make sure that you have a light by your bed that is easy to reach. Do not use any sheets or blankets that are too big for your bed. They should not hang down onto the floor. Have a firm chair that has side arms. You can use this for support while you get dressed. Do not have throw rugs and other things on the floor that can make you trip. What can I do in the kitchen? Clean up any spills right away. Avoid walking on wet floors. Keep items that you use a lot in easy-to-reach places. If you need to reach something above you, use a strong step stool that has a grab bar. Keep electrical cords out of the way. Do not use floor polish or wax that makes floors slippery. If you must use wax, use non-skid floor wax. Do not have throw rugs and other things on the floor that can make you trip. What can I do with my stairs? Do not leave any items on the stairs. Make sure that there are handrails on both sides of the stairs and use them. Fix handrails that are broken or loose. Make sure that handrails are as long as the stairways. Check any carpeting to make sure that it is firmly attached to the stairs. Fix any carpet that is loose or worn. Avoid having throw rugs at the top or bottom of the stairs. If  you do have throw rugs, attach them to the floor with carpet tape. Make sure that you have a light switch at the top of the stairs and the bottom of the stairs. If you do not have them, ask someone to add them for you. What else can I do to help prevent falls? Wear shoes that: Do not have high heels. Have rubber bottoms. Are comfortable and fit you well. Are closed at the toe. Do not wear sandals. If you use a stepladder: Make sure that it is fully opened. Do not climb a closed stepladder. Make sure that both sides of the stepladder are locked into place. Ask someone to hold it for you, if possible. Clearly mark and make sure that you can  see: Any grab bars or handrails. First and last steps. Where the edge of each step is. Use tools that help you move around (mobility aids) if they are needed. These include: Canes. Walkers. Scooters. Crutches. Turn on the lights when you go into a dark area. Replace any light bulbs as soon as they burn out. Set up your furniture so you have a clear path. Avoid moving your furniture around. If any of your floors are uneven, fix them. If there are any pets around you, be aware of where they are. Review your medicines with your doctor. Some medicines can make you feel dizzy. This can increase your chance of falling. Ask your doctor what other things that you can do to help prevent falls. This information is not intended to replace advice given to you by your health care provider. Make sure you discuss any questions you have with your health care provider. Document Released: 11/22/2008 Document Revised: 07/04/2015 Document Reviewed: 03/02/2014 Elsevier Interactive Patient Education  2017 [employer].

## 2021-09-04 NOTE — Progress Notes (Signed)
I connected with Elizabeth Curtis today by telephone and verified that I am speaking with the correct person using two identifiers. Location patient: home Location provider: work Persons participating in the virtual visit: Elizabeth Curtis, Damas LPN.   I discussed the limitations, risks, security and privacy concerns of performing an evaluation and management service by telephone and the availability of in person appointments. I also discussed with the patient that there may be a patient responsible charge related to this service. The patient expressed understanding and verbally consented to this telephonic visit.    Interactive audio and video telecommunications were attempted between this provider and patient, however failed, due to patient having technical difficulties OR patient did not have access to video capability.  We continued and completed visit with audio only.     Vital signs may be patient reported or missing.  Subjective:   Elizabeth Curtis is a 71 y.o. female who presents for Medicare Annual (Subsequent) preventive examination.  Review of Systems     Cardiac Risk Factors include: advanced age (>70mn, >>71women);diabetes mellitus;dyslipidemia;hypertension     Objective:    Today's Vitals   09/04/21 1226  Weight: 156 lb (70.8 kg)  Height: '5\' 9"'$  (1.753 m)   Body mass index is 23.04 kg/m.     09/04/2021   12:30 PM 07/15/2018   11:22 AM 04/29/2016   10:51 AM  Advanced Directives  Does Patient Have a Medical Advance Directive? Yes Yes Yes  Type of [place]amed[place]of [place] will [place]  Copy of [place] Chart? Yes - validated most recent copy scanned in chart (See row information) Yes - validated most recent copy scanned in chart (See row information) No - copy requested    Current Medications (verified) Outpatient Encounter  Medications as of 09/04/2021  Medication Sig   amLODipine (NORVASC) 5 MG tablet TAKE 1 TABLET BY MOUTH EVERYDAY AT BEDTIME   BD PEN NEEDLE NANO 2ND GEN 32G X 4 MM MISC USE AS DIRECTED ONCE DAILY   Cholecalciferol (VITAMIN D3 PO) Take 5,000 Units by mouth daily.   Continuous Blood Gluc Receiver (FREESTYLE LIBRE READER) DEVI Use as instructed to check blood sugar daily   Continuous Blood Gluc Sensor (FREESTYLE LIBRE 3 SENSOR) MISC Use as instructed to check blood sugar daily   enalapril (VASOTEC) 20 MG tablet TAKE 1 TABLET BY MOUTH TWICE A DAY   glipiZIDE (GLUCOTROL) 5 MG tablet TAKE 1 TABLET (5 MG TOTAL) BY MOUTH 2 (TWO) TIMES DAILY BEFORE A MEAL.   glucose blood (FREESTYLE LITE) test strip USE TO CHECK SUGAR ONCE DAILY AND AS NEEDED. DX:E11.65   insulin glargine (LANTUS SOLOSTAR) 100 UNIT/ML Solostar Pen Inject 20 Units into the skin daily.   LORazepam (ATIVAN) 1 MG tablet Take 1 hour prior to MRI/MRCP   metFORMIN (GLUCOPHAGE-XR) 500 MG 24 hr tablet TAKE 1 TABLET IN MORNING AND 2 TABLETS IN EVENING WITH MEALS   MISC NATURAL PRODUCTS PO Take by mouth. CBD supplement 0.5 mg by mouth daily 1500 mg PRN   MISC NATURAL PRODUCTS PO Take by mouth. [facility]   OVER THE COUNTER MEDICATION CBD PM 1000 mg with Melatonin as needed for sleep   TURMERIC CURCUMIN PO Take 750 mg by mouth daily.   ZADITOR 0.025 % ophthalmic solution Apply to eye.   No facility-administered encounter medications on file as of 09/04/2021.    Allergies (verified) Avandia [rosiglitazone], Colesevelam, Other,  Statins, and Trulicity [dulaglutide]   History: Past Medical History:  Diagnosis Date   Allergy    Arthritis    OA   Blood transfusion without reported diagnosis    with C Section    Cataract 2020   removed both eyes    Chronic low back pain    Diabetes mellitus    Diffuse cystic mastopathy    Disturbance of skin sensation    Diverticulosis of colon (without mention of hemorrhage)    Dysphagia,  unspecified(787.20)    Essential hypertension, benign 1995   H/O cystitis 2011   History of colonic polyps    Hyperplastic   History of pelvic ultrasound 03/05   Uterus 13 X 7 X 8cm, Fibroid 2.6cm   HLD (hyperlipidemia)    borderline    Lipoma    Mammographic microcalcification 2013   MDD (major depressive disorder) 11/22/2008   Melanoma ([place])    upper abdomin left SN removed under axilla   Osteopenia 03/2015   DEXA hip -1.7, spine -1.7   Plantar wart    Schatzki's ring 07/23/08   EGD dilated O/W normal (Dr. 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   Vision abnormalities    Past Surgical History:  Procedure Laterality Date   BREAST BIOPSY Right 07/2011   rec rpt 6 mo, neg   BREAST BIOPSY Right 06/07/2019   [place] core  coil clip - fibrocystic change with apocrine metaplasia   [place]   [place] presentation   COLONOSCOPY  2010   [place], rec rpt 10 yrs   COLONOSCOPY  02/2020   multiple polyps, hem, diverticulosis, lipoma, rpt 3 yrs ([place])   ESOPHAGOGASTRODUODENOSCOPY  2010   [place], dilated schatzki ring   ESOPHAGOGASTRODUODENOSCOPY  01/2021   schatzki ring, dilated, benign biopsies ([place])   EYE SURGERY Left 2012   cataract   INTRAOCULAR LENS EXCHANGE Left 05/2017   uveitis-glaucoma-hypheme syndrome ([place])   KNEE ARTHROSCOPY W/ MENISCAL REPAIR Left 01/2014   [place]   MELANOMA EXCISION  2012   upper abdomen   POLYPECTOMY     2008 TA, 2010 HPP    Family History  Problem Relation Age of Onset   Diabetes Mother    Hyperlipidemia Mother    Hypertension Mother    Mental illness Mother        Personality disorder   Kidney disease Father        failure, (Diaylsis)   Diabetes Father    Diabetes Brother    Coronary artery disease Brother 38       CAD, hospitalized freq, PTCA   Gout Brother    Hypertension Brother    Cancer Maternal Aunt 60       breast   Breast cancer Maternal Aunt 8   Cancer  Paternal Aunt 70       breast   Breast cancer Paternal Aunt 11   ALS Paternal Aunt    Stroke Paternal Grandfather    Stroke Maternal Grandmother    Breast cancer Maternal Aunt 70   Colon cancer Neg Hx    Colon polyps Neg Hx    Esophageal cancer Neg Hx    Rectal cancer Neg Hx    Stomach cancer Neg Hx    Social History   Socioeconomic History   Marital status: Married    Spouse name: Not on file   Number of children: 1   Years of education: Not on file   Highest education level:  Not on file  Occupational History   Occupation: Retired    Fish farm manager: RETIRED    Comment: Investment banker, corporate  Tobacco Use   Smoking status: Never   Smokeless tobacco: Never  Vaping Use   Vaping Use: Never used  Substance and Sexual Activity   Alcohol use: Yes    Comment: occassionally   Drug use: No   Sexual activity: Not Currently  Other Topics Concern   Not on file  Social History Narrative   Daily caffeine use- 3 drinks daily   Occupation: retired, 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   Activity: Patient does not get regular exercise, has gym membership   Diet: good water, daily fruits/vegetables   Social Determinants of Health   Financial Resource Strain: Low Risk  (09/04/2021)   Overall Financial Resource Strain (CARDIA)    Difficulty of Paying Living Expenses: Not hard at all  Food Insecurity: No Food Insecurity (09/04/2021)   Hunger Vital Sign    Worried About Running Out of Food in the Last Year: Never true    Ran Out of Food in the Last Year: Never true  Transportation Needs: No Transportation Needs (09/04/2021)   PRAPARE - Hydrologist (Medical): No    Lack of Transportation (Non-Medical): No  Physical Activity: Insufficiently Active (09/04/2021)   Exercise Vital Sign    Days of Exercise per Week: 3 days    Minutes of Exercise per Session: 30 min  Stress: No Stress Concern Present (09/04/2021)   [place]    Feeling of Stress : Not at all  Social Connections: Not on file    Tobacco Counseling Counseling given: Not Answered   Clinical Intake:  Pre-visit preparation completed: Yes  Pain : No/denies pain     Nutritional Status: BMI of 19-24  Normal Nutritional Risks: None Diabetes: Yes  How often do you need to have someone help you when you read instructions, pamphlets, or other written materials from your doctor or pharmacy?: 1 - Never  Diabetic? Yes Nutrition Risk Assessment:  Has the patient had any N/V/D within the last 2 months?  No  Does the patient have any non-healing wounds?  No  Has the patient had any unintentional weight loss or weight gain?  No   Diabetes:  Is the patient diabetic?  Yes  If diabetic, was a CBG obtained today?  No  Did the patient bring in their glucometer from home?  No  How often do you monitor your CBG's? frequently.   Financial Strains and Diabetes Management:  Are you having any financial strains with the device, your supplies or your medication? No .  Does the patient want to be seen by Chronic Care Management for management of their diabetes?  No  Would the patient like to be referred to a Nutritionist or for Diabetic Management?  No   Diabetic Exams:  Diabetic Eye Exam: Completed 12/25/2020 Diabetic Foot Exam: Completed 05/06/2021   Interpreter Needed?: No  Information entered by :: NAllen LPN   Activities of Daily Living    09/04/2021   12:31 PM 09/03/2021    8:18 AM  In your present state of health, do you have any difficulty performing the following activities:  Hearing? 0 0  Vision? 0 0  Difficulty concentrating or making decisions? 0 0  Walking or climbing stairs? 0 0  Dressing or bathing? 0 0  Doing errands, shopping? 0 0  Preparing Food and eating ? N N  Using the Toilet? N N  In the past six months, have you accidently leaked urine? N N  Do  you have problems with loss of bowel control? N N  Managing your Medications? N N  Managing your Finances? N N  Housekeeping or managing your Housekeeping? N N    Patient Care Team: Ria Bush, MD as PCP - General (Family Medicine) Christene Lye, MD (General Surgery) Debbora Dus, [facility] as Pharmacist (Pharmacist)  Indicate any recent Medical Services you may have received from other than [facility] providers in the past year (date may be approximate).     Assessment:   This is a routine wellness examination for Elizabeth Curtis.  Hearing/Vision screen Vision Screening - Comments:: Regular eye exams, Dr. Edison Pace  Dietary issues and exercise activities discussed: Current Exercise Habits: Home exercise routine, Type of exercise: walking;Other - see comments (recumbent bike), Time (Minutes): 30, Frequency (Times/Week): 3, Weekly Exercise (Minutes/Week): 90   Goals Addressed             This Visit's Progress    Patient Stated       09/04/2021, wants to keep blood sugars down       Depression Screen    09/04/2021   12:31 PM 09/03/2020   11:01 AM 08/04/2019   10:44 AM 07/15/2018   11:28 AM 06/30/2017   10:30 AM 04/29/2016   10:52 AM 03/07/2015   11:42 AM  PHQ 2/9 Scores  PHQ - 2 Score 0 0 0 0 0 0 0  PHQ- 9 Score    0       Fall Risk    09/04/2021   12:30 PM 09/03/2021    8:18 AM 09/03/2020   11:01 AM 08/04/2019   10:43 AM 07/15/2018   11:28 AM  Fall Risk   Falls in the past year? 1 1 0 0 0  Comment lost balance in the dark      Number falls in past yr: 0 0     Injury with Fall? 0 0     Risk for fall due to : Medication side effect      Follow up Falls evaluation completed;Education provided;Falls prevention discussed        FALL RISK PREVENTION PERTAINING TO THE HOME:  Any stairs in or around the home? Yes  If so, are there any without handrails? No  Home free of loose throw rugs in walkways, pet beds, electrical cords, etc? Yes  Adequate lighting in your home to  reduce risk of falls? Yes   ASSISTIVE DEVICES UTILIZED TO PREVENT FALLS:  Life alert? No  Use of a cane, walker or w/c? No  Grab bars in the bathroom? Yes  Shower chair or bench in shower? No  Elevated toilet seat or a handicapped toilet? Yes   TIMED UP AND GO:  Was the test performed? No .      Cognitive Function:    07/15/2018   11:26 AM 04/29/2016   10:52 AM  MMSE - Mini Mental State Exam  Orientation to time 5 5  Orientation to Place 5 5  Registration 3 3  Attention/ Calculation 0 0  Recall 3 3  Language- name 2 objects 0 0  Language- repeat 1 1  Language- follow 3 step command 0 3  Language- read & follow direction 0 0  Write a sentence 0 0  Copy design 0 0  Total score 17  20        09/04/2021   12:32 PM  6CIT Screen  What Year? 0 points  What month? 0 points  What time? 0 points  Count back from 20 0 points  Months in reverse 0 points  Repeat phrase 0 points  Total Score 0 points    Immunizations Immunization History  Administered Date(s) Administered   Fluad Quad(high Dose 65+) 11/24/2018   Influenza Split 10/23/2010, 12/16/2011   Influenza Whole 11/23/2007, 10/31/2008, 12/28/2009   Influenza, High Dose Seasonal PF 12/24/2016, 12/02/2017, 12/06/2019, 11/22/2020   Influenza,inj,Quad PF,6+ Mos 12/26/2012, 12/29/2013, 12/27/2014, 11/15/2015   PFIZER Comirnaty(Gray Top)Covid-19 Tri-Sucrose Vaccine 09/08/2020   PFIZER(Purple Top)SARS-COV-2 Vaccination 03/16/2019, 04/06/2019, 11/23/2019   Pfizer Covid-19 Vaccine Bivalent Booster 50yr & up 12/16/2020   Pneumococcal Conjugate-13 03/07/2015   Pneumococcal Polysaccharide-23 09/22/2011   Td 02/28/1998, 05/09/2008    TDAP status: Due, Education has been provided regarding the importance of this vaccine. Advised may receive this vaccine at local pharmacy or Health Dept. Aware to provide a copy of the vaccination record if obtained from local pharmacy or Health Dept. Verbalized acceptance and  understanding.  Flu Vaccine status: Up to date  Pneumococcal vaccine status: Up to date  Covid-19 vaccine status: Completed vaccines  Qualifies for Shingles Vaccine? Yes   Zostavax completed No   Shingrix Completed?: No.    Education has been provided regarding the importance of this vaccine. Patient has been advised to call insurance company to determine out of pocket expense if they have not yet received this vaccine. Advised may also receive vaccine at local pharmacy or Health Dept. Verbalized acceptance and understanding.  Screening Tests Health Maintenance  Topic Date Due   Zoster Vaccines- Shingrix (1 of 2) Never done   Pneumonia Vaccine 72 Years old (3 - PPSV23 or PCV20) 09/21/2016   TETANUS/TDAP  05/10/2018   COVID-19 Vaccine (6 - Pfizer series) 04/15/2021   INFLUENZA VACCINE  09/09/2021   OPHTHALMOLOGY EXAM  12/25/2021   HEMOGLOBIN A1C  03/06/2022   FOOT EXAM  05/07/2022   MAMMOGRAM  06/03/2022   COLONOSCOPY (Pts 45-450yrInsurance coverage will need to be confirmed)  03/09/2023   DEXA SCAN  Completed   Hepatitis C Screening  Completed   HPV VACCINES  Aged Out    Health Maintenance  Health Maintenance Due  Topic Date Due   Zoster Vaccines- Shingrix (1 of 2) Never done   Pneumonia Vaccine 6572Years old (3 28 PPSV23 or PCV20) 09/21/2016   TETANUS/TDAP  05/10/2018   COVID-19 Vaccine (6 - Pfizer series) 04/15/2021    Colorectal cancer screening: Type of screening: Colonoscopy. Completed 03/08/2020. Repeat every 3 years  Mammogram status: Completed 06/02/2021. Repeat every year  Bone Density status: Completed 09/09/2020.   Lung Cancer Screening: (Low Dose CT Chest recommended if Age 72-80ears, 30 pack-year currently smoking OR have quit w/in 15years.) does not qualify.   Lung Cancer Screening Referral: no  Additional Screening:  Hepatitis C Screening: does qualify; Completed 03/01/2015  Vision Screening: Recommended annual ophthalmology exams for early detection  of glaucoma and other disorders of the eye. Is the patient up to date with their annual eye exam?  Yes  Who is the provider or what is the name of the office in which the patient attends annual eye exams? Dr. KiEdison Pacef pt is not established with a provider, would they like to be referred to a provider to establish care? No .   Dental Screening: Recommended annual dental exams for  proper oral hygiene  Community Resource Referral / Chronic Care Management: CRR required this visit?  No   CCM required this visit?  No      Plan:     I have personally reviewed and noted the following in the patient's chart:   Medical and social history Use of alcohol, tobacco or illicit drugs  Current medications and supplements including opioid prescriptions.  Functional ability and status Nutritional status Physical activity Advanced directives List of other physicians Hospitalizations, surgeries, and ER visits in previous 12 months Vitals Screenings to include cognitive, depression, and falls Referrals and appointments  In addition, I have reviewed and discussed with patient certain preventive protocols, quality metrics, and best practice recommendations. A written personalized care plan for preventive services as well as general preventive health recommendations were provided to patient.     Kellie Simmering, LPN   08/10/6376   Nurse Notes: none  Due to this being a virtual visit, the after visit summary with patients personalized plan was offered to patient via mail or my-chart.  Patient would like to access on my-chart

## 2021-09-12 ENCOUNTER — Encounter: Payer: Self-pay | Admitting: Pharmacist

## 2021-09-12 NOTE — Progress Notes (Signed)
[facility])                                            [place] Team                                        Statin Quality Measure Assessment    09/12/2021  Elizabeth Curtis Feb 22, 1950 [id number]   Per review of chart and payor information, patient has a diagnosis of diabetes but is not currently filling a statin prescription.  This places patient into the SUPD (Statin Use In Patients with Diabetes) measure for CMS.    Patient has documented trials of rosuvastatin and pravastatin with reported intolerance, but no corresponding CPT codes that would exclude patient from SUPD measure.  The ASCVD Risk score (Arnett DK, et al., 2019) failed to calculate for the following reasons:   The systolic blood pressure is missing 09/03/2021     Component Value Date/Time   CHOL 188 09/03/2021 0729   TRIG 88.0 09/03/2021 0729   HDL 50.20 09/03/2021 0729   CHOLHDL 4 09/03/2021 0729   VLDL 17.6 09/03/2021 0729   LDLCALC 120 (H) 09/03/2021 0729   LDLDIRECT 122.0 12/23/2015 0906    Please consider ONE of the following recommendations:  Initiate high intensity statin Atorvastatin '40mg'$  once daily, #90, 3 refills   Rosuvastatin '20mg'$  once daily, #90, 3 refills    Initiate moderate intensity          statin with reduced frequency if prior          statin intolerance 1x weekly, #13, 3 refills   2x weekly, #26, 3 refills   3x weekly, #39, 3 refills    Code for past statin intolerance or  other exclusions (required annually)  Provider Requirements: Associate code during an office visit or telehealth encounter  Drug Induced Myopathy G72.0   Myopathy, unspecified G72.9   Myositis, unspecified M60.9   Rhabdomyolysis M62.82   Cirrhosis of liver K74.69   Prediabetes R73.03   PCOS E28.2    Thank you,   Loretha Brasil, PharmD [place] Clinical Pharmacist Office: [phone]

## 2021-09-15 ENCOUNTER — Encounter: Payer: Self-pay | Admitting: Family Medicine

## 2021-09-15 ENCOUNTER — Ambulatory Visit (INDEPENDENT_AMBULATORY_CARE_PROVIDER_SITE_OTHER): Payer: PPO | Admitting: Family Medicine

## 2021-09-15 VITALS — BP 134/86 | HR 78 | Temp 97.4°F | Ht 67.75 in | Wt 156.5 lb

## 2021-09-15 DIAGNOSIS — Z794 Long term (current) use of insulin: Secondary | ICD-10-CM

## 2021-09-15 DIAGNOSIS — Z85820 Personal history of malignant melanoma of skin: Secondary | ICD-10-CM | POA: Diagnosis not present

## 2021-09-15 DIAGNOSIS — Z0000 Encounter for general adult medical examination without abnormal findings: Secondary | ICD-10-CM | POA: Diagnosis not present

## 2021-09-15 DIAGNOSIS — E785 Hyperlipidemia, unspecified: Secondary | ICD-10-CM

## 2021-09-15 DIAGNOSIS — R946 Abnormal results of thyroid function studies: Secondary | ICD-10-CM

## 2021-09-15 DIAGNOSIS — H4042X0 Glaucoma secondary to eye inflammation, left eye, stage unspecified: Secondary | ICD-10-CM

## 2021-09-15 DIAGNOSIS — H918X9 Other specified hearing loss, unspecified ear: Secondary | ICD-10-CM | POA: Diagnosis not present

## 2021-09-15 DIAGNOSIS — T85398A Other mechanical complication of other ocular prosthetic devices, implants and grafts, initial encounter: Secondary | ICD-10-CM

## 2021-09-15 DIAGNOSIS — Z7189 Other specified counseling: Secondary | ICD-10-CM

## 2021-09-15 DIAGNOSIS — N393 Stress incontinence (female) (male): Secondary | ICD-10-CM

## 2021-09-15 DIAGNOSIS — M8589 Other specified disorders of bone density and structure, multiple sites: Secondary | ICD-10-CM

## 2021-09-15 DIAGNOSIS — I10 Essential (primary) hypertension: Secondary | ICD-10-CM | POA: Diagnosis not present

## 2021-09-15 DIAGNOSIS — T466X5A Adverse effect of antihyperlipidemic and antiarteriosclerotic drugs, initial encounter: Secondary | ICD-10-CM

## 2021-09-15 DIAGNOSIS — E1169 Type 2 diabetes mellitus with other specified complication: Secondary | ICD-10-CM | POA: Diagnosis not present

## 2021-09-15 DIAGNOSIS — Z789 Other specified health status: Secondary | ICD-10-CM | POA: Diagnosis not present

## 2021-09-15 DIAGNOSIS — K862 Cyst of pancreas: Secondary | ICD-10-CM

## 2021-09-15 DIAGNOSIS — R202 Paresthesia of skin: Secondary | ICD-10-CM | POA: Diagnosis not present

## 2021-09-15 DIAGNOSIS — H209 Unspecified iridocyclitis: Secondary | ICD-10-CM

## 2021-09-15 MED ORDER — BD PEN NEEDLE NANO 2ND GEN 32G X 4 MM MISC: 3 refills | Status: DC

## 2021-09-15 MED ORDER — ENALAPRIL MALEATE 20 MG PO TABS: 20.0000 mg | ORAL_TABLET | Freq: Every day | ORAL | 3 refills | Status: DC

## 2021-09-15 MED ORDER — GLIPIZIDE 5 MG PO TABS: 5.0000 mg | ORAL_TABLET | Freq: Two times a day (BID) | ORAL | 3 refills | Status: DC

## 2021-09-15 MED ORDER — METFORMIN HCL ER 500 MG PO TB24: 500.0000 mg | ORAL_TABLET | Freq: Two times a day (BID) | ORAL | 3 refills | Status: DC

## 2021-09-15 MED ORDER — LANTUS SOLOSTAR 100 UNIT/ML ~~LOC~~ SOPN: 20.0000 [IU] | PEN_INJECTOR | Freq: Every day | SUBCUTANEOUS | 4 refills | Status: DC

## 2021-09-15 MED ORDER — AMLODIPINE BESYLATE 5 MG PO TABS
ORAL_TABLET | ORAL | 3 refills | Status: DC
Start: 2021-09-15 — End: 2022-09-23

## 2021-09-15 NOTE — Assessment & Plan Note (Addendum)
This is now followed by GI, with yearly MRI.  She wonders about relation of metformin with pancreatic cysts.  I offered endocrinology evaluation to optimize antihyperglycemic regimen -she will consider this.

## 2021-09-15 NOTE — Assessment & Plan Note (Signed)
Advanced directives: living will scanned into chart 2017. Husband Chrissie Noa then son Gerald Stabs are [place]. Does not want prolonged life support if terminally or incurably ill.

## 2021-09-15 NOTE — Assessment & Plan Note (Signed)
Intolerant to all statins tried as well as RYR, welchol.

## 2021-09-15 NOTE — Assessment & Plan Note (Signed)
Wears hearing aid on left ear.

## 2021-09-15 NOTE — Assessment & Plan Note (Signed)
Sees dermatologist year.

## 2021-09-15 NOTE — Assessment & Plan Note (Signed)
Chronic, improved based on latest A1c as well as freestyle libre 3 data she brings in which was reviewed. Continue current regimen of metformin XR, glipizide, lantus.

## 2021-09-15 NOTE — Assessment & Plan Note (Signed)
Regularly seeing eye doctor. 

## 2021-09-15 NOTE — Assessment & Plan Note (Signed)
TSH, fT4 stable.

## 2021-09-15 NOTE — Progress Notes (Signed)
Patient ID: Elizabeth Curtis, female    DOB: 07/14/1950, 71 y.o.   MRN: [id number]  This visit was conducted in person.  BP 134/86   Pulse 78   Temp (!) 97.4 F (36.3 C) (Temporal)   Ht 5' 7.75" (1.721 m)   Wt 156 lb 8 oz (71 kg)   SpO2 99%   BMI 23.97 kg/m    CC: CPE Subjective:   HPI: Elizabeth Curtis is a 71 y.o. female presenting on 09/15/2021 for Annual Exam (MCR prt 2. )   Saw health advisor last month for medicare wellness visit. Note reviewed.    No results found.  Flowsheet Row Clinical Support from 09/04/2021 in [facility]  PHQ-2 Total Score 0          09/04/2021   12:30 PM 09/03/2021    8:18 AM 09/03/2020   11:01 AM 08/04/2019   10:43 AM 07/15/2018   11:28 AM  Fall Risk   Falls in the past year? 1 1 0 0 0  Comment lost balance in the dark      Number falls in past yr: 0 0     Injury with Fall? 0 0     Risk for fall due to : Medication side effect      Follow up Falls evaluation completed;Education provided;Falls prevention discussed      Dizzy/lightheaded at night while getting up to go to restroom, fell into tub. Fortunately no injury. Since then dropped enalapril at night and doing better.  DM - continues metformin XR '500mg'$  in am and '1000mg'$  in pm along with glipizide '5mg'$  bid and lantus 20u daily. Brings report of freestyle libre 3. 93% in target range over the past month, average glucose 131, A1c average 6.4%. she's dropped metformin XR to '500mg'$  bid. She also changed glipizide to 1/2 tab with breakfast and lunch and 1 whole tablet at night. Just bought recumbent bike 08/2021.   HTN - brings log - 120-130s/70-80s, doing well on amlodipine, enalapril   Preventative: COLONOSCOPY 02/2020 - TAx3, HPx1, hemorrhoids, diverticulosis, lipoma, rpt 31yr (Tarri Glenn  Well woman Dr PKennon Rounds4/2023. Latest pap normal, absent endocervical zone due to atrophy  LMP - 2010. Menopausal.  Mammogram - 05/2021 - Bir[facility]1 @ [place]  DEXA 03/27/15 Osteopenia   DEXA 09/2020 - T -1.8 spine, -2.0 R femur neck - mild progression from last check 2017. Continues 5000 IU vit D nightly. Drinks kefir, eats chees, leafy greens - reviewed goal '1200mg'$ /day  Lung cancer screening - not eligible  Flu shot yearly. COVID vaccine Pfizer 03/2019 x2, booster 11/2019, 08/2020, bivalent 12/2020 Tetanus 2000, 2010.  Pneumovax 09/2011 (felt sick afterwards). Prevnar-13 02/2015  Shingrix - discussed, interested   Advanced directives: living will scanned into chart 2017. Husband KChrissie No[place]hen son [place]erald [place] Does not want prolonged life support if terminally or incurably ill.  Seat belt use discussed Sunscreen use discussed, no changing skin lesions. H/o melanoma 2012, sees Dr DEvorn Gongderm yearly.  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 Interim medical history since our last visit reviewed. Allergies and  medications reviewed and updated. Outpatient Medications Prior to Visit  Medication Sig Dispense Refill   Cholecalciferol (VITAMIN D3 PO) Take 5,000 Units by mouth daily.     Continuous Blood Gluc Sensor (FREESTYLE LIBRE 3 SENSOR) MISC Use as instructed to check blood sugar daily 2 each 3   glucose blood (FREESTYLE LITE) test strip USE TO CHECK SUGAR ONCE DAILY AND AS NEEDED. DX:E11.65 100 each 3   LORazepam (ATIVAN) 1 MG tablet Take 1 hour prior to MRI/MRCP 1 tablet 0   MISC NATURAL PRODUCTS PO Take by mouth. [facility]     OVER THE COUNTER MEDICATION CBD PM 1000 mg with Melatonin as needed for sleep     TURMERIC CURCUMIN PO Take 750 mg by mouth daily.      ZADITOR 0.025 % ophthalmic solution Apply to eye.     amLODipine (NORVASC) 5 MG tablet TAKE 1 TABLET BY MOUTH EVERYDAY AT BEDTIME 90 tablet 0   BD PEN NEEDLE NANO 2ND GEN 32G X 4 MM MISC USE AS DIRECTED ONCE DAILY 100 each 3   enalapril (VASOTEC) 20 MG tablet TAKE 1 TABLET BY MOUTH TWICE A DAY 180 tablet 0   glipiZIDE (GLUCOTROL) 5 MG tablet TAKE 1 TABLET (5 MG TOTAL) BY MOUTH 2 (TWO) TIMES DAILY BEFORE A MEAL. 180 tablet 0   insulin glargine (LANTUS SOLOSTAR) 100 UNIT/ML Solostar Pen Inject 20 Units into the skin daily. 15 mL 4   metFORMIN (GLUCOPHAGE-XR) 500 MG 24 hr tablet TAKE 1 TABLET IN MORNING AND 2 TABLETS IN EVENING WITH MEALS 270 tablet 0   Continuous Blood Gluc Receiver (FREESTYLE LIBRE READER) DEVI Use as instructed to check blood sugar daily 1 each 0   MISC NATURAL PRODUCTS PO Take by mouth. CBD supplement 0.5 mg by mouth daily 1500 mg PRN     No facility-administered medications prior to visit.     Per HPI unless specifically indicated in ROS section below Review of Systems  Constitutional:  Negative for activity change, appetite change, chills, fatigue, fever and unexpected weight change.  HENT:  Negative for hearing loss.   Eyes:  Negative for visual disturbance.  Respiratory:  Positive for shortness of breath (occ). Negative for cough, chest tightness and wheezing.   Cardiovascular:  Negative for chest pain, palpitations and leg swelling.  Gastrointestinal:  Negative for abdominal distention, abdominal pain, blood in stool, constipation, diarrhea, nausea and vomiting.  Genitourinary:  Negative for difficulty urinating and hematuria.  Musculoskeletal:  Negative for arthralgias, myalgias and neck pain.  Skin:  Negative for rash.  Neurological:  Positive for dizziness (better on lower enalapril dose). Negative for seizures, syncope and headaches.  Hematological:  Negative for adenopathy. Bruises/bleeds easily.  Psychiatric/Behavioral:  Negative for dysphoric mood. The patient  is not nervous/anxious.     Objective:  BP 134/86   Pulse 78   Temp (!) 97.4 F (36.3 C) (Temporal)   Ht 5' 7.75" (1.721 m)   Wt 156 lb 8 oz (71 kg)   SpO2 99%   BMI 23.97 kg/m   Wt Readings from Last 3 Encounters:  09/15/21 156 lb 8 oz (71 kg)  09/04/21 156 lb (70.8 kg)  06/04/21 160 lb (72.6 kg)      Physical Exam Vitals and nursing note reviewed.  Constitutional:      Appearance: Normal appearance. She is not ill-appearing.  HENT:     Head: Normocephalic and atraumatic.     Right Ear: Tympanic membrane, ear canal and external ear normal. There  is no impacted cerumen.     Left Ear: Tympanic membrane, ear canal and external ear normal. There is no impacted cerumen.  Eyes:     General:        Right eye: No discharge.        Left eye: No discharge.     Extraocular Movements: Extraocular movements intact.     Conjunctiva/sclera: Conjunctivae normal.     Pupils: Pupils are equal, round, and reactive to light.  Neck:     Thyroid: No thyroid mass or thyromegaly.     Vascular: No carotid bruit.  Cardiovascular:     Rate and Rhythm: Normal rate and regular rhythm.     Pulses: Normal pulses.     Heart sounds: Normal heart sounds. No murmur heard. Pulmonary:     Effort: Pulmonary effort is normal. No respiratory distress.     Breath sounds: Normal breath sounds. No wheezing, rhonchi or rales.  Abdominal:     General: Bowel sounds are normal. There is no distension.     Palpations: Abdomen is soft. There is no mass.     Tenderness: There is no abdominal tenderness. There is no guarding or rebound.     Hernia: No hernia is present.  Musculoskeletal:     Cervical back: Normal range of motion and neck supple. No rigidity.     Right lower leg: No edema.     Left lower leg: No edema.  Lymphadenopathy:     Cervical: No cervical adenopathy.  Skin:    General: Skin is warm and dry.     Findings: No rash.  Neurological:     General: No focal deficit present.     Mental  Status: She is alert. Mental status is at baseline.  Psychiatric:        Mood and Affect: Mood normal.        Behavior: Behavior normal.       Results for orders placed or performed in visit on 09/03/21  Microalbumin / creatinine urine ratio  Result Value Ref Range   Microalb, Ur <0.7 0.0 - 1.9 mg/dL   Creatinine,U 49.0 mg/dL   Microalb Creat Ratio 1.4 0.0 - 30.0 mg/g  VITAMIN D 25 Hydroxy (Vit-D Deficiency, Fractures)  Result Value Ref Range   VITD 78.80 30.00 - 100.00 ng/mL  T4, free  Result Value Ref Range   Free T4 0.95 0.60 - 1.60 ng/dL  TSH  Result Value Ref Range   TSH 0.65 0.35 - 5.50 uIU/mL  Hemoglobin A1c  Result Value Ref Range   Hgb A1c MFr Bld 6.7 (H) 4.6 - 6.5 %  Comprehensive metabolic panel  Result Value Ref Range   Sodium 140 135 - 145 mEq/L   Potassium 4.2 3.5 - 5.1 mEq/L   Chloride 103 96 - 112 mEq/L   CO2 31 19 - 32 mEq/L   Glucose, Bld 113 (H) 70 - 99 mg/dL   BUN 14 6 - 23 mg/dL   Creatinine, Ser 0.71 0.40 - 1.20 mg/dL   Total Bilirubin 0.4 0.2 - 1.2 mg/dL   Alkaline Phosphatase 53 39 - 117 U/L   AST 10 0 - 37 U/L   ALT 11 0 - 35 U/L   Total Protein 6.6 6.0 - 8.3 g/dL   Albumin 4.3 3.5 - 5.2 g/dL   GFR 85.41 >60.00 mL/min   Calcium 9.4 8.4 - 10.5 mg/dL  Lipid panel  Result Value Ref Range   Cholesterol 188 0 - 200 mg/dL  Triglycerides 88.0 0.0 - 149.0 mg/dL   HDL 50.20 >39.00 mg/dL   VLDL 17.6 0.0 - 40.0 mg/dL   LDL Cholesterol 120 (H) 0 - 99 mg/dL   Total CHOL/HDL Ratio 4    NonHDL 137.38    No results found for: "VITAMINB12"   Assessment & Plan:   Problem List Items Addressed This Visit     Healthcare maintenance - Primary (Chronic)    Preventative protocols reviewed and updated unless pt declined. Discussed healthy diet and lifestyle.       Advanced care planning/counseling discussion (Chronic)    Advanced directives: living will scanned into chart 2017. Husband Chrissie Noa then son Gerald Stabs are [place]. Does not want prolonged life  support if terminally or incurably ill.       Type 2 diabetes mellitus with other specified complication (HCC)    Chronic, improved based on latest A1c as well as freestyle libre 3 data she brings in which was reviewed. Continue current regimen of metformin XR, glipizide, lantus.       Relevant Medications   glipiZIDE (GLUCOTROL) 5 MG tablet   metFORMIN (GLUCOPHAGE-XR) 500 MG 24 hr tablet   insulin glargine (LANTUS SOLOSTAR) 100 UNIT/ML Solostar Pen   enalapril (VASOTEC) 20 MG tablet   Essential hypertension, benign    Chronic, stable on current regimen - she's been able to drop enalapril to once daily, stopped night time dose due to concerns over orthostatic dizziness that led to fall.       Relevant Medications   amLODipine (NORVASC) 5 MG tablet   enalapril (VASOTEC) 20 MG tablet   History of malignant melanoma    Sees dermatologist year.       Hyperlipidemia associated with type 2 diabetes mellitus (HCC)    Chronic LDL above goal <100 in diabetic. Statin intolerance. Did not tolerate welchol either. Discussed possible lipid clinic evaluation. Declines all cholesterol medication at this time. Reviewed increased fiber in diet, consider fiber supplementation to improve LDL.  The 10-year ASCVD risk score (Arnett DK, et al., 2019) is: 27.3%   Values used to calculate the score:     Age: 43 years     Sex: Female     Is Non-Hispanic African American: No     Diabetic: Yes     Tobacco smoker: No     Systolic Blood Pressure: 956 mmHg     Is BP treated: Yes     HDL Cholesterol: 50.2 mg/dL     Total Cholesterol: 188 mg/dL       Relevant Medications   amLODipine (NORVASC) 5 MG tablet   glipiZIDE (GLUCOTROL) 5 MG tablet   metFORMIN (GLUCOPHAGE-XR) 500 MG 24 hr tablet   insulin glargine (LANTUS SOLOSTAR) 100 UNIT/ML Solostar Pen   enalapril (VASOTEC) 20 MG tablet   Abnormal thyroid function test    TSH, fT4 stable.       Osteopenia    Reviewed latest DEXA showing persistent  osteopenia. Discussed calcium, vit D intake, regular weight bearing exercise. She is worried the vitamin D 5000 units daily may have worsened LDL levels- agrees to try Monday Wednesday Friday dosing and see effect on LDL.      Uveitis-hyphema-glaucoma syndrome of left eye    Regularly seeing eye doctor.      Paresthesia of left foot    Ongoing-will need vitamin B12 levels next labs.      Asymmetrical hearing loss    Wears hearing aid on left ear.      Statin intolerance  Intolerant to all statins tried as well as RYR, welchol.       Stress incontinence   Adverse reaction to statin medication   Pancreatic cyst    This is now followed by GI, with yearly MRI.  She wonders about relation of metformin with pancreatic cysts.  I offered endocrinology evaluation to optimize antihyperglycemic regimen -she will consider this.        Meds ordered this encounter  Medications   amLODipine (NORVASC) 5 MG tablet    Sig: TAKE 1 TABLET BY MOUTH EVERYDAY AT BEDTIME    Dispense:  90 tablet    Refill:  3   Insulin Pen Needle (BD PEN NEEDLE NANO 2ND GEN) 32G X 4 MM MISC    Sig: USE AS DIRECTED ONCE DAILY    Dispense:  100 each    Refill:  3   glipiZIDE (GLUCOTROL) 5 MG tablet    Sig: Take 1 tablet (5 mg total) by mouth 2 (two) times daily before a meal.    Dispense:  180 tablet    Refill:  3   metFORMIN (GLUCOPHAGE-XR) 500 MG 24 hr tablet    Sig: Take 1 tablet (500 mg total) by mouth in the morning and at bedtime.    Dispense:  180 tablet    Refill:  3    Note new sig   insulin glargine (LANTUS SOLOSTAR) 100 UNIT/ML Solostar Pen    Sig: Inject 20 Units into the skin daily.    Dispense:  15 mL    Refill:  4   enalapril (VASOTEC) 20 MG tablet    Sig: Take 1 tablet (20 mg total) by mouth daily.    Dispense:  90 tablet    Refill:  3    Note new sig   No orders of the defined types were placed in this encounter.   Patient instructions: If interested, check with pharmacy about new  2 shot shingles series (shingrix).  Continue current medicines.  Good to see you today.  Return as needed or in 6 months for diabetes follow up. Consider endocrinology evaluation.   Follow up plan: Return in about 6 months (around 03/18/2022) for follow up visit.  Ria Bush, MD

## 2021-09-15 NOTE — Assessment & Plan Note (Signed)
Chronic, stable on current regimen - she's been able to drop enalapril to once daily, stopped night time dose due to concerns over orthostatic dizziness that led to fall.

## 2021-09-15 NOTE — Assessment & Plan Note (Signed)
Reviewed latest DEXA showing persistent osteopenia. Discussed calcium, vit D intake, regular weight bearing exercise. She is worried the vitamin D 5000 units daily may have worsened LDL levels- agrees to try Monday Wednesday Friday dosing and see effect on LDL.

## 2021-09-15 NOTE — Assessment & Plan Note (Signed)
Ongoing-will need vitamin B12 levels next labs.

## 2021-09-15 NOTE — Assessment & Plan Note (Addendum)
Chronic LDL above goal <100 in diabetic. Statin intolerance. Did not tolerate welchol either. Discussed possible lipid clinic evaluation. Declines all cholesterol medication at this time. Reviewed increased fiber in diet, consider fiber supplementation to improve LDL.  The 10-year ASCVD risk score (Arnett DK, et al., 2019) is: 27.3%   Values used to calculate the score:     Age: 71 years     Sex: Female     Is Non-Hispanic African American: No     Diabetic: Yes     Tobacco smoker: No     Systolic Blood Pressure: 156 mmHg     Is BP treated: Yes     HDL Cholesterol: 50.2 mg/dL     Total Cholesterol: 188 mg/dL

## 2021-09-15 NOTE — Patient Instructions (Addendum)
If interested, check with pharmacy about new 2 shot shingles series (shingrix).  Continue current medicines.  Good to see you today.  Return as needed or in 6 months for diabetes follow up. Consider endocrinology evaluation.   Health Maintenance After Age 72 After age 32, you are at a higher risk for certain long-term diseases and infections as well as injuries from falls. Falls are a major cause of broken bones and head injuries in people who are older than age 20. Getting regular preventive care can help to keep you healthy and well. Preventive care includes getting regular testing and making lifestyle changes as recommended by your health care provider. Talk with your health care provider about: Which screenings and tests you should have. A screening is a test that checks for a disease when you have no symptoms. A diet and exercise plan that is right for you. What should I know about screenings and tests to prevent falls? Screening and testing are the best ways to find a health problem early. Early diagnosis and treatment give you the best chance of managing medical conditions that are common after age 79. Certain conditions and lifestyle choices may make you more likely to have a fall. Your health care provider may recommend: Regular vision checks. Poor vision and conditions such as cataracts can make you more likely to have a fall. If you wear glasses, make sure to get your prescription updated if your vision changes. Medicine review. Work with your health care provider to regularly review all of the medicines you are taking, including over-the-counter medicines. Ask your health care provider about any side effects that may make you more likely to have a fall. Tell your health care provider if any medicines that you take make you feel dizzy or sleepy. Strength and balance checks. Your health care provider may recommend certain tests to check your strength and balance while standing, walking, or  changing positions. Foot health exam. Foot pain and numbness, as well as not wearing proper footwear, can make you more likely to have a fall. Screenings, including: Osteoporosis screening. Osteoporosis is a condition that causes the bones to get weaker and break more easily. Blood pressure screening. Blood pressure changes and medicines to control blood pressure can make you feel dizzy. Depression screening. You may be more likely to have a fall if you have a fear of falling, feel depressed, or feel unable to do activities that you used to do. Alcohol use screening. Using too much alcohol can affect your balance and may make you more likely to have a fall. Follow these instructions at home: Lifestyle Do not drink alcohol if: Your health care provider tells you not to drink. If you drink alcohol: Limit how much you have to: 0-1 drink a day for women. 0-2 drinks a day for men. Know how much alcohol is in your drink. In the [place] one drink equals one 12 oz bottle of beer (355 mL), one 5 oz glass of wine (148 mL), or one 1 oz glass of hard liquor (44 mL). Do not use any products that contain nicotine or tobacco. These products include cigarettes, chewing tobacco, and vaping devices, such as e-cigarettes. If you need help quitting, ask your health care provider. Activity  Follow a regular exercise program to stay fit. This will help you maintain your balance. Ask your health care provider what types of exercise are appropriate for you. If you need a cane or walker, use it as recommended by your health  care provider. Wear supportive shoes that have nonskid soles. Safety  Remove any tripping hazards, such as rugs, cords, and clutter. Install safety equipment such as grab bars in bathrooms and safety rails on stairs. Keep rooms and walkways well-lit. General instructions Talk with your health care provider about your risks for falling. Tell your health care provider if: You fall. Be sure to  tell your health care provider about all falls, even ones that seem minor. You feel dizzy, tiredness (fatigue), or off-balance. Take over-the-counter and prescription medicines only as told by your health care provider. These include supplements. Eat a healthy diet and maintain a healthy weight. A healthy diet includes low-fat dairy products, low-fat (lean) meats, and fiber from whole grains, beans, and lots of fruits and vegetables. Stay current with your vaccines. Schedule regular health, dental, and eye exams. Summary Having a healthy lifestyle and getting preventive care can help to protect your health and wellness after age 20. Screening and testing are the best way to find a health problem early and help you avoid having a fall. Early diagnosis and treatment give you the best chance for managing medical conditions that are more common for people who are older than age 36. Falls are a major cause of broken bones and head injuries in people who are older than age 64. Take precautions to prevent a fall at home. Work with your health care provider to learn what changes you can make to improve your health and wellness and to prevent falls. This information is not intended to replace advice given to you by your health care provider. Make sure you discuss any questions you have with your health care provider. Document Revised: 06/17/2020 Document Reviewed: 06/17/2020 Elsevier Patient Education  [employer].

## 2021-09-15 NOTE — Assessment & Plan Note (Signed)
Preventative protocols reviewed and updated unless pt declined. Discussed healthy diet and lifestyle.  

## 2021-09-16 NOTE — Telephone Encounter (Signed)
Printed report and placed in Dr. Synthia Innocent box.

## 2021-10-10 DIAGNOSIS — B0052 Herpesviral keratitis: Secondary | ICD-10-CM | POA: Diagnosis not present

## 2021-10-30 DIAGNOSIS — B0052 Herpesviral keratitis: Secondary | ICD-10-CM | POA: Diagnosis not present

## 2021-11-02 ENCOUNTER — Other Ambulatory Visit: Payer: Self-pay | Admitting: Family Medicine

## 2021-11-03 NOTE — Telephone Encounter (Signed)
Dose change: -Enalapril 20 mg 1 tab daily.  New rx sent 09/15/21, #90/3. -Metformin XR 500 mg 1 tab BID.  New rx sent 09/15/21, #180/3.  See 09/15/21 OV notes.

## 2021-11-20 ENCOUNTER — Encounter: Payer: Self-pay | Admitting: Family Medicine

## 2021-11-20 NOTE — Telephone Encounter (Signed)
Updated pt's chart.  

## 2021-12-11 DIAGNOSIS — L570 Actinic keratosis: Secondary | ICD-10-CM | POA: Diagnosis not present

## 2021-12-11 DIAGNOSIS — Z1331 Encounter for screening for depression: Secondary | ICD-10-CM | POA: Diagnosis not present

## 2021-12-11 DIAGNOSIS — D2271 Melanocytic nevi of right lower limb, including hip: Secondary | ICD-10-CM | POA: Diagnosis not present

## 2021-12-11 DIAGNOSIS — X32XXXA Exposure to sunlight, initial encounter: Secondary | ICD-10-CM | POA: Diagnosis not present

## 2021-12-11 DIAGNOSIS — E1169 Type 2 diabetes mellitus with other specified complication: Secondary | ICD-10-CM | POA: Diagnosis not present

## 2021-12-11 DIAGNOSIS — D2261 Melanocytic nevi of right upper limb, including shoulder: Secondary | ICD-10-CM | POA: Diagnosis not present

## 2021-12-11 DIAGNOSIS — L538 Other specified erythematous conditions: Secondary | ICD-10-CM | POA: Diagnosis not present

## 2021-12-11 DIAGNOSIS — I10 Essential (primary) hypertension: Secondary | ICD-10-CM | POA: Diagnosis not present

## 2021-12-11 DIAGNOSIS — Z85820 Personal history of malignant melanoma of skin: Secondary | ICD-10-CM | POA: Diagnosis not present

## 2021-12-11 DIAGNOSIS — L2089 Other atopic dermatitis: Secondary | ICD-10-CM | POA: Diagnosis not present

## 2021-12-11 DIAGNOSIS — M8580 Other specified disorders of bone density and structure, unspecified site: Secondary | ICD-10-CM | POA: Diagnosis not present

## 2021-12-11 DIAGNOSIS — E785 Hyperlipidemia, unspecified: Secondary | ICD-10-CM | POA: Diagnosis not present

## 2021-12-11 DIAGNOSIS — L820 Inflamed seborrheic keratosis: Secondary | ICD-10-CM | POA: Diagnosis not present

## 2022-01-14 DIAGNOSIS — E119 Type 2 diabetes mellitus without complications: Secondary | ICD-10-CM | POA: Diagnosis not present

## 2022-01-14 LAB — HM DIABETES EYE EXAM

## 2022-01-19 ENCOUNTER — Encounter: Payer: Self-pay | Admitting: Family Medicine

## 2022-02-05 ENCOUNTER — Encounter: Payer: Self-pay | Admitting: Family Medicine

## 2022-03-04 DIAGNOSIS — H18832 Recurrent erosion of cornea, left eye: Secondary | ICD-10-CM | POA: Diagnosis not present

## 2022-03-11 DIAGNOSIS — H18832 Recurrent erosion of cornea, left eye: Secondary | ICD-10-CM | POA: Diagnosis not present

## 2022-03-18 ENCOUNTER — Ambulatory Visit: Payer: PPO | Admitting: Family Medicine

## 2022-03-20 DIAGNOSIS — H18832 Recurrent erosion of cornea, left eye: Secondary | ICD-10-CM | POA: Diagnosis not present

## 2022-04-01 DIAGNOSIS — H18832 Recurrent erosion of cornea, left eye: Secondary | ICD-10-CM | POA: Diagnosis not present

## 2022-04-13 ENCOUNTER — Encounter: Payer: Self-pay | Admitting: Family Medicine

## 2022-04-23 ENCOUNTER — Other Ambulatory Visit: Payer: Self-pay | Admitting: Family Medicine

## 2022-04-23 DIAGNOSIS — Z1231 Encounter for screening mammogram for malignant neoplasm of breast: Secondary | ICD-10-CM

## 2022-04-27 ENCOUNTER — Telehealth: Payer: Self-pay | Admitting: Gastroenterology

## 2022-04-27 NOTE — Telephone Encounter (Signed)
The pt has been advised that she is not due for MRI until May.  She is aware we will call her when it it closer to the time she is due.  The pt has been advised of the information and verbalized understanding.

## 2022-04-27 NOTE — Telephone Encounter (Signed)
Inbound call from patient, is requesting to schedule a MRI. States she was told the MRI had to be done yearly and last had one in May 2023. Patient is aware of Dr. Tarri Glenn leaving, would like to transfer to Dr. Lorenso Courier afterwards. Patient did not wish to schedule an appointment with Dr. Lorenso Courier. Patient is requesting order to be sent in.

## 2022-05-13 DIAGNOSIS — H18832 Recurrent erosion of cornea, left eye: Secondary | ICD-10-CM | POA: Diagnosis not present

## 2022-05-25 ENCOUNTER — Encounter: Payer: Self-pay | Admitting: Pharmacist

## 2022-05-25 NOTE — Progress Notes (Signed)
[facility]) [facility] Quality Pharmacy Team Statin Quality Measure Assessment  05/25/2022  Elizabeth Curtis Mar 27, 1950 [id number]  Per review of chart and payor information, patient has a diagnosis of diabetes but is not currently filling a statin prescription.  This places patient into the Statin Use In Patients with Diabetes (SUPD) measure for CMS.    Documentation that patient is intolerant to all statins.   The 10-year ASCVD risk score (Arnett DK, et al., 2019) is: 30%   Values used to calculate the score:     Age: 72 years     Sex: Female     Is Non-Hispanic African American: No     Diabetic: Yes     Tobacco smoker: No     Systolic Blood Pressure: 134 mmHg     Is BP treated: Yes     HDL Cholesterol: 50.2 mg/dL     Total Cholesterol: 188 mg/dL 7/41/6384     Component Value Date/Time   CHOL 188 09/03/2021 0729   TRIG 88.0 09/03/2021 0729   HDL 50.20 09/03/2021 0729   CHOLHDL 4 09/03/2021 0729   VLDL 17.6 09/03/2021 0729   LDLCALC 120 (H) 09/03/2021 0729   LDLDIRECT 122.0 12/23/2015 0906    Please consider ONE of the following recommendations:  Initiate high intensity statin Atorvastatin 40 mg once daily, #90, 3 refills   Rosuvastatin 20 mg once daily, #90, 3 refills    Initiate moderate intensity          statin with reduced frequency if prior          statin intolerance 1x weekly, #13, 3 refills   2x weekly, #26, 3 refills   3x weekly, #39, 3 refills    Code for past statin intolerance or  other exclusions (required annually)  Provider Requirements: Associate code during an office visit or telehealth encounter  Drug Induced Myopathy G72.0   Myopathy, unspecified G72.9   Myositis, unspecified M60.9   Rhabdomyolysis M62.82   Cirrhosis of liver K74.69   Prediabetes R73.03   PCOS E28.2   Thank you for allowing [facility] pharmacy to be a part of this patient's care.   Reynold Bowen, PharmD [facility]  [facility] Clinical  Pharmacist Direct Dial: [phone]

## 2022-05-29 ENCOUNTER — Encounter: Payer: Self-pay | Admitting: Family Medicine

## 2022-05-29 ENCOUNTER — Ambulatory Visit (INDEPENDENT_AMBULATORY_CARE_PROVIDER_SITE_OTHER): Payer: PPO | Admitting: Family Medicine

## 2022-05-29 VITALS — BP 160/76 | HR 74 | Temp 97.3°F | Ht 67.75 in | Wt 162.4 lb

## 2022-05-29 DIAGNOSIS — G720 Drug-induced myopathy: Secondary | ICD-10-CM

## 2022-05-29 DIAGNOSIS — T466X5A Adverse effect of antihyperlipidemic and antiarteriosclerotic drugs, initial encounter: Secondary | ICD-10-CM | POA: Diagnosis not present

## 2022-05-29 DIAGNOSIS — E1169 Type 2 diabetes mellitus with other specified complication: Secondary | ICD-10-CM | POA: Diagnosis not present

## 2022-05-29 DIAGNOSIS — I10 Essential (primary) hypertension: Secondary | ICD-10-CM | POA: Diagnosis not present

## 2022-05-29 DIAGNOSIS — Z794 Long term (current) use of insulin: Secondary | ICD-10-CM | POA: Diagnosis not present

## 2022-05-29 DIAGNOSIS — H18839 Recurrent erosion of cornea, unspecified eye: Secondary | ICD-10-CM

## 2022-05-29 LAB — POCT GLYCOSYLATED HEMOGLOBIN (HGB A1C): Hemoglobin A1C: 7.5 % — AB (ref 4.0–5.6)

## 2022-05-29 MED ORDER — VITAMIN D3 125 MCG (5000 UT) PO CAPS: 5000.0000 [IU] | ORAL_CAPSULE | ORAL | Status: AC

## 2022-05-29 MED ORDER — B COMPLEX VITAMINS PO CAPS: 1.0000 | ORAL_CAPSULE | ORAL | Status: DC

## 2022-05-29 MED ORDER — FISH OIL 1000 MG PO CAPS: 2.0000 | ORAL_CAPSULE | Freq: Every day | ORAL | 0 refills | Status: AC

## 2022-05-29 NOTE — Patient Instructions (Addendum)
Sugars were worse with A1c 7.5% - work on [employer] choices, regular exercise.  We will recheck in 3 months  Blood pressure was too high today - start monitoring at home daily and if consistently >140/90 let me know to titrate BP medicines. Send me readings in 1-2 weeks.  Return in 3 months for diabetes and blood pressure follow up

## 2022-05-29 NOTE — Assessment & Plan Note (Signed)
Recent diagnosis by eye clinic - started omega-3 fish oil tablets and continues lubricating drops/ointment.

## 2022-05-29 NOTE — Progress Notes (Signed)
Ph: [phone]       Fax: [phone]   Patient ID: Elizabeth Curtis, female    DOB: 08/10/1950, 72 y.o.   MRN: [id number]  This visit was conducted in person.  BP (!) 160/76   Pulse 74   Temp (!) 97.3 F (36.3 C) (Temporal)   Ht 5' 7.75" (1.721 m)   Wt 162 lb 6 oz (73.7 kg)   SpO2 98%   BMI 24.87 kg/m   BP Readings from Last 3 Encounters:  05/29/22 (!) 160/76  09/15/21 134/86  06/04/21 (!) 145/77  170s/90s on repeat testing  CC: DM f/u visit  Subjective:   HPI: Elizabeth Curtis is a 72 y.o. 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 Treating with Muro ointment/eye drops (saline). Started on omega-3  daily.   HTN - Compliant with current antihypertensive regimen of amlodipine  daily and enalapril  daily. This was a decreased dose from prior (BID). Does not check blood pressures at home. No low blood pressure readings or symptoms of dizziness/syncope. Denies HA, vision changes, CP/tightness, SOB, leg swelling. No increased salt intake, no decongestant use. However she does eat out daily for lunch.   DM - does regularly check sugars and brings data which was reviewed: 14d average 146, time in range 84%, with 16% above range (181-250). Compliant with antihyperglycemic regimen which includes: glipizide  bid, lantus 20u daily, metformin XR  BID. She had more desserts during holiday season. She hasn't been using recumbent bike but this past week has become more active in the yard. She's been eating dark chocolate hummus as well as activia yogurt in evenings, eating fresh fruit. Denies low sugars or hypoglycemic symptoms. Denies paresthesias, blurry vision. Notes chronic paresthesia/numbness to left lateral pinky. Last diabetic eye exam 01/2022. Glucometer brand: Freestyle libre 3. Last foot  exam: 04/2021 - DUE. DSME: completed remotely at [facility]. She is aware of stevia and monkfruit. Regularly eats out at lunch.  Lab Results  Component Value Date   HGBA1C 7.5 (A) 05/29/2022   Diabetic Foot Exam - Simple   Simple Foot Form Diabetic Foot exam was performed with the following findings: Yes 05/29/2022  9:29 AM  Visual Inspection No deformities, no ulcerations, no other skin breakdown bilaterally: Yes Sensation Testing Intact to touch and monofilament testing bilaterally: Yes Pulse Check Posterior Tibialis and Dorsalis pulse intact bilaterally: Yes Comments    Lab Results  Component Value Date   MICROALBUR <0.7 09/03/2021         Relevant past medical, surgical, family and social history reviewed and updated as indicated. Interim medical history since our last visit reviewed. Allergies and medications reviewed and updated. Outpatient Medications Prior to Visit  Medication Sig Dispense Refill   amLODipine (NORVASC) 5 MG tablet TAKE 1 TABLET BY MOUTH EVERYDAY AT BEDTIME 90 tablet 3   Continuous Blood Gluc Sensor (FREESTYLE LIBRE 3 SENSOR) MISC Use as instructed to check blood sugar daily 2 each 3   enalapril (VASOTEC) 20 MG tablet Take 1 tablet (20 mg total) by mouth daily. 90 tablet 3   glipiZIDE (GLUCOTROL) 5 MG tablet Take 1 tablet (5 mg total) by mouth 2 (two) times daily before a meal. 180 tablet 3   glucose blood (FREESTYLE LITE) test strip USE TO CHECK SUGAR ONCE DAILY AND AS NEEDED. DX:E11.65 100 each 3  insulin glargine (LANTUS SOLOSTAR) 100 UNIT/ML Solostar Pen Inject 20 Units into the skin daily. 15 mL 4   Insulin Pen Needle (BD PEN NEEDLE NANO 2ND GEN) 32G X 4 MM MISC USE AS DIRECTED ONCE DAILY 100 each 3   LORazepam (ATIVAN) 1 MG tablet Take 1 hour prior to MRI/MRCP 1 tablet 0   metFORMIN (GLUCOPHAGE-XR) 500 MG 24 hr tablet Take 1 tablet (500 mg total) by mouth in the morning and at bedtime. 180 tablet 3   MISC NATURAL PRODUCTS PO Take by mouth. [facility] Wellness      OVER THE COUNTER MEDICATION CBD PM 1000 mg with Melatonin as needed for sleep     TURMERIC CURCUMIN PO Take 750 mg by mouth daily.     ZADITOR 0.025 % ophthalmic solution Apply to eye.     Cholecalciferol (VITAMIN D3 PO) Take 5,000 Units by mouth daily.     No facility-administered medications prior to visit.     Per HPI unless specifically indicated in ROS section below Review of Systems  Objective:  BP (!) 160/76   Pulse 74   Temp (!) 97.3 F (36.3 C) (Temporal)   Ht 5' 7.75" (1.721 m)   Wt 162 lb 6 oz (73.7 kg)   SpO2 98%   BMI 24.87 kg/m   Wt Readings from Last 3 Encounters:  05/29/22 162 lb 6 oz (73.7 kg)  09/15/21 156 lb 8 oz (71 kg)  09/04/21 156 lb (70.8 kg)      Physical Exam Vitals and nursing note reviewed.  Constitutional:      Appearance: Normal appearance. She is not ill-appearing.  Eyes:     Extraocular Movements: Extraocular movements intact.     Conjunctiva/sclera: Conjunctivae normal.     Pupils: Pupils are equal, round, and reactive to light.  Cardiovascular:     Rate and Rhythm: Normal rate and regular rhythm.     Pulses: Normal pulses.     Heart sounds: Normal heart sounds. No murmur heard. Pulmonary:     Effort: Pulmonary effort is normal. No respiratory distress.     Breath sounds: Normal breath sounds. No wheezing, rhonchi or rales.  Musculoskeletal:     Right lower leg: No edema.     Left lower leg: No edema.  Skin:    General: Skin is warm and dry.     Findings: No rash.  Neurological:     Mental Status: She is alert.  Psychiatric:        Mood and Affect: Mood normal.        Behavior: Behavior normal.       Results for orders placed or performed in visit on 05/29/22  POCT glycosylated hemoglobin (Hb A1C)  Result Value Ref Range   Hemoglobin A1C 7.5 (A) 4.0 - 5.6 %   HbA1c POC (<> result, manual entry)     HbA1c, POC (prediabetic range)     HbA1c, POC (controlled diabetic range)     Lab Results  Component Value Date    CREATININE 0.71 09/03/2021   BUN 14 09/03/2021   NA 140 09/03/2021   K 4.2 09/03/2021   CL 103 09/03/2021   CO2 31 09/03/2021    Assessment & Plan:   Problem List Items Addressed This Visit     Type 2 diabetes mellitus with other specified complication - Primary    Chronic, deteriorated based on A1c in office today however overall good control at home based on freestyle libre CGM readings. Continue current regimen,  encouraged renewed efforts towards low sugar low carb diabetic diet.       Relevant Orders   POCT glycosylated hemoglobin (Hb A1C) (Completed)   Essential hypertension, benign    Chronic, deterioration noted in office.  She continues amlodipine and enalapril.  Declines med change at this time.  She will monitor BP daily at home and let me know if consistently >140/90 to titrate antihypertensives - discussed returning to enalapril 20mg  BID vs adding thiazide diuretic.  RTC 3 mo DM f/u visit       Statin myopathy   Recurrent erosion of cornea    Recent diagnosis by eye clinic - started omega-3 fish oil tablets and continues lubricating drops/ointment.         Meds ordered this encounter  Medications   Omega-3 Fatty Acids (FISH OIL) 1000 MG CAPS    Sig: Take 2 capsules (2,000 mg total) by mouth daily.    Refill:  0   b complex vitamins capsule    Sig: Take 1 capsule by mouth every Monday, Wednesday, and Friday.   Cholecalciferol (VITAMIN D3) 125 MCG (5000 UT) CAPS    Sig: Take 1 capsule (5,000 Units total) by mouth every Monday, Wednesday, and Friday.    Dispense:  30 capsule    Orders Placed This Encounter  Procedures   POCT glycosylated hemoglobin (Hb A1C)    Patient Instructions  Sugars were worse with A1c 7.5% - work on [employer] choices, regular exercise.  We will recheck in 3 months  Blood pressure was too high today - start monitoring at home daily and if consistently >140/90 let me know to titrate BP medicines. Send me readings in 1-2 weeks.   Return in 3 months for diabetes and blood pressure follow up  Follow up plan: Return in about 3 months (around 08/28/2022) for follow up visit.  Eustaquio Boyden, MD

## 2022-05-29 NOTE — Assessment & Plan Note (Addendum)
Chronic, deterioration noted in office.  She continues amlodipine and enalapril.  Declines med change at this time.  She will monitor BP daily at home and let me know if consistently >140/90 to titrate antihypertensives - discussed returning to enalapril  BID vs adding thiazide diuretic.  RTC 3 mo DM f/u visit

## 2022-05-29 NOTE — Assessment & Plan Note (Signed)
Chronic, deteriorated based on A1c in office today however overall good control at home based on freestyle libre CGM readings. Continue current regimen, encouraged renewed efforts towards low sugar low carb diabetic diet.

## 2022-06-04 ENCOUNTER — Ambulatory Visit
Admission: RE | Admit: 2022-06-04 | Discharge: 2022-06-04 | Disposition: A | Discharge status: Home / Self Care | Payer: PPO | Source: Ambulatory Visit | Attending: Family Medicine | Admitting: Family Medicine

## 2022-06-04 DIAGNOSIS — Z1231 Encounter for screening mammogram for malignant neoplasm of breast: Secondary | ICD-10-CM | POA: Insufficient documentation

## 2022-06-05 ENCOUNTER — Encounter: Payer: Self-pay | Admitting: Family Medicine

## 2022-07-14 ENCOUNTER — Ambulatory Visit: Payer: PPO | Admitting: Internal Medicine

## 2022-08-19 ENCOUNTER — Other Ambulatory Visit: Payer: Self-pay

## 2022-08-19 DIAGNOSIS — K862 Cyst of pancreas: Secondary | ICD-10-CM

## 2022-08-19 DIAGNOSIS — R935 Abnormal findings on diagnostic imaging of other abdominal regions, including retroperitoneum: Secondary | ICD-10-CM

## 2022-08-19 DIAGNOSIS — F410 Panic disorder [episodic paroxysmal anxiety] without agoraphobia: Secondary | ICD-10-CM

## 2022-08-19 MED ORDER — LORAZEPAM 1 MG PO TABS
ORAL_TABLET | ORAL | 0 refills | Status: DC
Start: 2022-08-19 — End: 2022-09-23

## 2022-08-19 NOTE — Telephone Encounter (Addendum)
Per Dr Leim Fabry to order MRI/MRCP attention pancreas w/wo contrast. Okay to order lorazepam for before her MRI. Let's schedule a follow up appointment with me so that I can meet her   Patient aware. Lorazepam prescription faxed to [facility]. Per patient request the MRI MRCP will be done at [facility].

## 2022-08-19 NOTE — Telephone Encounter (Signed)
PT is calling to get an order placed for MRI. Requesting call back to discuss. Would like it to be done by Dr. Leonides Schanz.

## 2022-08-19 NOTE — Telephone Encounter (Signed)
Spoke with the patient. She has panic when getting the MRI and asks for medication for her imaging. Confirmed she is requesting Dr Leonides Schanz take over her care. Formerly seen by Dr Orvan Falconer. Denies any problems or concerns at this time.  I will order the MRI/MRCP attention pancreas. Should it be w/wo contrast again? Can I order sedative (lorazepam was used last time) for her to take 1 hour prior to imaging. She endorses anxiety when having an MRI.

## 2022-08-27 ENCOUNTER — Other Ambulatory Visit: Payer: Self-pay | Admitting: Internal Medicine

## 2022-08-27 DIAGNOSIS — K862 Cyst of pancreas: Secondary | ICD-10-CM

## 2022-08-27 DIAGNOSIS — R935 Abnormal findings on diagnostic imaging of other abdominal regions, including retroperitoneum: Secondary | ICD-10-CM

## 2022-08-28 ENCOUNTER — Ambulatory Visit
Admission: RE | Admit: 2022-08-28 | Discharge: 2022-08-28 | Disposition: A | Discharge status: Home / Self Care | Payer: PPO | Source: Ambulatory Visit | Attending: Internal Medicine | Admitting: Internal Medicine

## 2022-08-28 DIAGNOSIS — R935 Abnormal findings on diagnostic imaging of other abdominal regions, including retroperitoneum: Secondary | ICD-10-CM | POA: Diagnosis not present

## 2022-08-28 DIAGNOSIS — K8020 Calculus of gallbladder without cholecystitis without obstruction: Secondary | ICD-10-CM | POA: Diagnosis not present

## 2022-08-28 DIAGNOSIS — K862 Cyst of pancreas: Secondary | ICD-10-CM

## 2022-08-28 MED ORDER — GADOBUTROL 1 MMOL/ML IV SOLN
7.0000 mL | Freq: Once | INTRAVENOUS | Status: AC | PRN
  Administered 2022-08-28: 7 mL via INTRAVENOUS

## 2022-09-03 ENCOUNTER — Encounter: Payer: Self-pay | Admitting: Internal Medicine

## 2022-09-09 ENCOUNTER — Encounter (INDEPENDENT_AMBULATORY_CARE_PROVIDER_SITE_OTHER): Payer: Self-pay

## 2022-09-11 ENCOUNTER — Encounter: Payer: Self-pay | Admitting: Family Medicine

## 2022-09-16 ENCOUNTER — Other Ambulatory Visit (INDEPENDENT_AMBULATORY_CARE_PROVIDER_SITE_OTHER): Payer: PPO

## 2022-09-16 ENCOUNTER — Other Ambulatory Visit: Payer: Self-pay | Admitting: Family Medicine

## 2022-09-16 ENCOUNTER — Ambulatory Visit (INDEPENDENT_AMBULATORY_CARE_PROVIDER_SITE_OTHER): Payer: PPO

## 2022-09-16 DIAGNOSIS — M8589 Other specified disorders of bone density and structure, multiple sites: Secondary | ICD-10-CM

## 2022-09-16 DIAGNOSIS — R946 Abnormal results of thyroid function studies: Secondary | ICD-10-CM

## 2022-09-16 DIAGNOSIS — Z0000 Encounter for general adult medical examination without abnormal findings: Secondary | ICD-10-CM | POA: Diagnosis not present

## 2022-09-16 DIAGNOSIS — G720 Drug-induced myopathy: Secondary | ICD-10-CM

## 2022-09-16 DIAGNOSIS — E1169 Type 2 diabetes mellitus with other specified complication: Secondary | ICD-10-CM

## 2022-09-16 DIAGNOSIS — Z794 Long term (current) use of insulin: Secondary | ICD-10-CM | POA: Diagnosis not present

## 2022-09-16 DIAGNOSIS — E785 Hyperlipidemia, unspecified: Secondary | ICD-10-CM | POA: Diagnosis not present

## 2022-09-16 NOTE — Patient Instructions (Signed)
Ms. Elizabeth Curtis , Thank you for taking time to come for your Medicare Wellness Visit. I appreciate your ongoing commitment to your health goals. Please review the following plan we discussed and let me know if I can assist you in the future.   Referrals/Orders/Follow-Ups/Clinician Recommendations: none  This is a list of the screening recommended for you and due dates:  Health Maintenance  Topic Date Due   Zoster (Shingles) Vaccine (1 of 2) Never done   DTaP/Tdap/Td vaccine (3 - Tdap) 05/10/2018   Pneumonia Vaccine (3 of 3 - PPSV23 or PCV20) 03/06/2020   COVID-19 Vaccine (7 - 2023-24 season) 03/22/2022   Yearly kidney function blood test for diabetes  09/04/2022   Yearly kidney health urinalysis for diabetes  09/04/2022   Flu Shot  09/10/2022   Hemoglobin A1C  11/28/2022   Eye exam for diabetics  01/15/2023   Colon Cancer Screening  03/09/2023   Complete foot exam   05/29/2023   Mammogram  06/04/2023   Medicare Annual Wellness Visit  09/16/2023   DEXA scan (bone density measurement)  Completed   Hepatitis C Screening  Completed   HPV Vaccine  Aged Out    Advanced directives: (In Chart) A copy of your advanced directives are scanned into your chart should your provider ever need it.  Next Medicare Annual Wellness Visit scheduled for next year: Yes  Preventive Care 9 Years and Older, Female  Preventive care refers to lifestyle choices and visits with your health care provider that can promote health and wellness. What does preventive care include? A yearly physical exam. This is also called an annual well check. Dental exams once or twice a year. Routine eye exams. Ask your health care provider how often you should have your eyes checked. Personal lifestyle choices, including: Daily care of your teeth and gums. Regular physical activity. Eating a healthy diet. Avoiding tobacco and drug use. Limiting alcohol use. Practicing safe sex. Taking low doses of aspirin every day. Taking  vitamin and mineral supplements as recommended by your health care provider. What happens during an annual well check? The services and screenings done by your health care provider during your annual well check will depend on your age, overall health, lifestyle risk factors, and family history of disease. Counseling  Your health care provider may ask you questions about your: Alcohol use. Tobacco use. Drug use. Emotional well-being. Home and relationship well-being. Sexual activity. Eating habits. History of falls. Memory and ability to understand (cognition). Work and work Astronomer. Screening  You may have the following tests or measurements: Height, weight, and BMI. Blood pressure. Lipid and cholesterol levels. These may be checked every 5 years, or more frequently if you are over 81 years old. Skin check. Lung cancer screening. You may have this screening every year starting at age 72 if you have a 30-pack-year history of smoking and currently smoke or have quit within the past 15 years. Fecal occult blood test (FOBT) of the stool. You may have this test every year starting at age 21. Flexible sigmoidoscopy or colonoscopy. You may have a sigmoidoscopy every 5 years or a colonoscopy every 10 years starting at age 69. Prostate cancer screening. Recommendations will vary depending on your family history and other risks. Hepatitis C blood test. Hepatitis B blood test. Sexually transmitted disease (STD) testing. Diabetes screening. This is done by checking your blood sugar (glucose) after you have not eaten for a while (fasting). You may have this done every 1-3 years. Abdominal aortic  aneurysm (AAA) screening. You may need this if you are a current or former smoker. Osteoporosis. You may be screened starting at age 76 if you are at high risk. Talk with your health care provider about your test results, treatment options, and if necessary, the need for more tests. Vaccines  Your  health care provider may recommend certain vaccines, such as: Influenza vaccine. This is recommended every year. Tetanus, diphtheria, and acellular pertussis (Tdap, Td) vaccine. You may need a Td booster every 10 years. Zoster vaccine. You may need this after age 75. Pneumococcal 13-valent conjugate (PCV13) vaccine. One dose is recommended after age 70. Pneumococcal polysaccharide (PPSV23) vaccine. One dose is recommended after age 23. Talk to your health care provider about which screenings and vaccines you need and how often you need them. This information is not intended to replace advice given to you by your health care provider. Make sure you discuss any questions you have with your health care provider. Document Released: 02/22/2015 Document Revised: 10/16/2015 Document Reviewed: 11/27/2014 Elsevier Interactive Patient Education  2017 [employer].  Fall Prevention in the Home Falls can cause injuries. They can happen to people of all ages. There are many things you can do to make your home safe and to help prevent falls. What can I do on the outside of my home? Regularly fix the edges of walkways and driveways and fix any cracks. Remove anything that might make you trip as you walk through a door, such as a raised step or threshold. Trim any bushes or trees on the path to your home. Use bright outdoor lighting. Clear any walking paths of anything that might make someone trip, such as rocks or tools. Regularly check to see if handrails are loose or broken. Make sure that both sides of any steps have handrails. Any raised decks and porches should have guardrails on the edges. Have any leaves, snow, or ice cleared regularly. Use sand or salt on walking paths during winter. Clean up any spills in your garage right away. This includes oil or grease spills. What can I do in the bathroom? Use night lights. Install grab bars by the toilet and in the tub and shower. Do not use towel bars as  grab bars. Use non-skid mats or decals in the tub or shower. If you need to sit down in the shower, use a plastic, non-slip stool. Keep the floor dry. Clean up any water that spills on the floor as soon as it happens. Remove soap buildup in the tub or shower regularly. Attach bath mats securely with double-sided non-slip rug tape. Do not have throw rugs and other things on the floor that can make you trip. What can I do in the bedroom? Use night lights. Make sure that you have a light by your bed that is easy to reach. Do not use any sheets or blankets that are too big for your bed. They should not hang down onto the floor. Have a firm chair that has side arms. You can use this for support while you get dressed. Do not have throw rugs and other things on the floor that can make you trip. What can I do in the kitchen? Clean up any spills right away. Avoid walking on wet floors. Keep items that you use a lot in easy-to-reach places. If you need to reach something above you, use a strong step stool that has a grab bar. Keep electrical cords out of the way. Do not use floor  polish or wax that makes floors slippery. If you must use wax, use non-skid floor wax. Do not have throw rugs and other things on the floor that can make you trip. What can I do with my stairs? Do not leave any items on the stairs. Make sure that there are handrails on both sides of the stairs and use them. Fix handrails that are broken or loose. Make sure that handrails are as long as the stairways. Check any carpeting to make sure that it is firmly attached to the stairs. Fix any carpet that is loose or worn. Avoid having throw rugs at the top or bottom of the stairs. If you do have throw rugs, attach them to the floor with carpet tape. Make sure that you have a light switch at the top of the stairs and the bottom of the stairs. If you do not have them, ask someone to add them for you. What else can I do to help prevent  falls? Wear shoes that: Do not have high heels. Have rubber bottoms. Are comfortable and fit you well. Are closed at the toe. Do not wear sandals. If you use a stepladder: Make sure that it is fully opened. Do not climb a closed stepladder. Make sure that both sides of the stepladder are locked into place. Ask someone to hold it for you, if possible. Clearly mark and make sure that you can see: Any grab bars or handrails. First and last steps. Where the edge of each step is. Use tools that help you move around (mobility aids) if they are needed. These include: Canes. Walkers. Scooters. Crutches. Turn on the lights when you go into a dark area. Replace any light bulbs as soon as they burn out. Set up your furniture so you have a clear path. Avoid moving your furniture around. If any of your floors are uneven, fix them. If there are any pets around you, be aware of where they are. Review your medicines with your doctor. Some medicines can make you feel dizzy. This can increase your chance of falling. Ask your doctor what other things that you can do to help prevent falls. This information is not intended to replace advice given to you by your health care provider. Make sure you discuss any questions you have with your health care provider. Document Released: 11/22/2008 Document Revised: 07/04/2015 Document Reviewed: 03/02/2014 Elsevier Interactive Patient Education  2017 [employer].

## 2022-09-16 NOTE — Progress Notes (Signed)
Subjective:   Elizabeth Curtis is a 72 y.o. female who presents for Medicare Annual (Subsequent) preventive examination.  Visit Complete: Virtual  I connected with  Elizabeth Curtis on 09/16/22 by a audio enabled telemedicine application and verified that I am speaking with the correct person using two identifiers.  Patient Location: Home  Provider Location: Office/Clinic  I discussed the limitations of evaluation and management by telemedicine. The patient expressed understanding and agreed to proceed.  Patient Medicare AWV questionnaire was completed by the patient on 09/15/2022; I have confirmed that all information answered by patient is correct and no changes since this date.  Vital Signs: Patient was unable to self-report vital signs via telehealth due to a lack of equipment at home.   Review of Systems     Cardiac Risk Factors include: advanced age (>78men, >55 women);diabetes mellitus;dyslipidemia;hypertension     Objective:    Today's Vitals   There is no height or weight on file to calculate BMI.     09/16/2022    8:46 AM 09/04/2021   12:30 PM 07/15/2018   11:22 AM 04/29/2016   10:51 AM  Advanced Directives  Does Patient Have a Medical Advance Directive? Yes Yes Yes Yes  Type of Estate agent of [place];Living will Healthcare Power of [place];Living will Healthcare Power of [place];Living will Healthcare Power of [place];Living will  Copy of Healthcare Power of Attorney in Chart? Yes - validated most recent copy scanned in chart (See row information) Yes - validated most recent copy scanned in chart (See row information) Yes - validated most recent copy scanned in chart (See row information) No - copy requested    Current Medications (verified) Outpatient Encounter Medications as of 09/16/2022  Medication Sig   amLODipine (NORVASC) 5 MG tablet TAKE 1 TABLET BY MOUTH EVERYDAY AT BEDTIME   b complex vitamins capsule Take 1 capsule by mouth  every Monday, Wednesday, and Friday.   Cholecalciferol (VITAMIN D3) 125 MCG (5000 UT) CAPS Take 1 capsule (5,000 Units total) by mouth every Monday, Wednesday, and Friday.   enalapril (VASOTEC) 20 MG tablet Take 1 tablet (20 mg total) by mouth daily.   glipiZIDE (GLUCOTROL) 5 MG tablet Take 1 tablet (5 mg total) by mouth 2 (two) times daily before a meal.   glucose blood (FREESTYLE LITE) test strip USE TO CHECK SUGAR ONCE DAILY AND AS NEEDED. DX:E11.65   insulin glargine (LANTUS SOLOSTAR) 100 UNIT/ML Solostar Pen Inject 20 Units into the skin daily.   Insulin Pen Needle (BD PEN NEEDLE NANO 2ND GEN) 32G X 4 MM MISC USE AS DIRECTED ONCE DAILY   LORazepam (ATIVAN) 1 MG tablet Take 1 hour prior to MRI/MRCP   metFORMIN (GLUCOPHAGE-XR) 500 MG 24 hr tablet Take 1 tablet (500 mg total) by mouth in the morning and at bedtime.   MISC NATURAL PRODUCTS PO Take by mouth. [facility]   Omega-3 Fatty Acids (FISH OIL) 1000 MG CAPS Take 2 capsules (2,000 mg total) by mouth daily.   OVER THE COUNTER MEDICATION CBD PM 1000 mg with Melatonin as needed for sleep   TURMERIC CURCUMIN PO Take 750 mg by mouth daily.   Continuous Blood Gluc Sensor (FREESTYLE LIBRE 3 SENSOR) MISC Use as instructed to check blood sugar daily (Patient not taking: Reported on 09/16/2022)   ZADITOR 0.025 % ophthalmic solution Apply to eye. (Patient not taking: Reported on 09/16/2022)   No facility-administered encounter medications on file as of 09/16/2022.    Allergies (verified) Avandia [  rosiglitazone], Colesevelam, Other, Statins, and Trulicity [dulaglutide]   History: Past Medical History:  Diagnosis Date   Allergy    Arthritis    OA   Blood transfusion without reported diagnosis    with C Section    Cataract 2020   removed both eyes    Chronic low back pain    Diabetes mellitus    Diffuse cystic mastopathy    Disturbance of skin sensation    Diverticulosis of colon (without mention of hemorrhage)    Dysphagia,  unspecified(787.20)    Essential hypertension, benign 1995   H/O cystitis 2011   History of colonic polyps    Hyperplastic   History of pelvic ultrasound 03/05   Uterus 13 X 7 X 8cm, Fibroid 2.6cm   HLD (hyperlipidemia)    borderline    Lipoma    Mammographic microcalcification 2013   MDD (major depressive disorder) 11/22/2008   Melanoma (HCC)    upper abdomin left SN removed under axilla   Osteopenia 03/2015   DEXA hip -1.7, spine -1.7   Plantar wart    Schatzki's ring 07/23/08   EGD dilated O/W normal (Dr. 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   Vision abnormalities    Past Surgical History:  Procedure Laterality Date   BREAST BIOPSY Right 07/2011   rec rpt 6 mo, neg   BREAST BIOPSY Right 06/07/2019   [place] core  coil clip - fibrocystic change with apocrine metaplasia   CESAREAN SECTION  1982   [place] presentation   COLONOSCOPY  2010   [facility], rec rpt 10 yrs   COLONOSCOPY  02/2020   multiple polyps, hem, diverticulosis, lipoma, rpt 3 yrs ([facility])   ESOPHAGOGASTRODUODENOSCOPY  2010   [facility], dilated schatzki ring   ESOPHAGOGASTRODUODENOSCOPY  01/2021   schatzki ring, dilated, benign biopsies ([facility])   EYE SURGERY Left 2012   cataract   INTRAOCULAR LENS EXCHANGE Left 05/2017   uveitis-glaucoma-hypheme syndrome ([facility])   KNEE ARTHROSCOPY W/ MENISCAL REPAIR Left 01/2014   [facility]   MELANOMA EXCISION  2012   upper abdomen   POLYPECTOMY     2008 TA, 2010 HPP    Family History  Problem Relation Age of Onset   Diabetes Mother    Hyperlipidemia Mother    Hypertension Mother    Mental illness Mother        Personality disorder   Kidney disease Father        failure, (Diaylsis)   Diabetes Father    Diabetes Brother    Coronary artery disease Brother 63       CAD, hospitalized freq, PTCA   Gout Brother    Hypertension Brother    Cancer Maternal Aunt 51       breast   Breast cancer Maternal Aunt 13   Cancer  Paternal Aunt 68       breast   Breast cancer Paternal Aunt 69   ALS Paternal Aunt    Stroke Paternal Grandfather    Stroke Maternal Grandmother    Breast cancer Maternal Aunt 17   Colon cancer Neg Hx    Colon polyps Neg Hx    Esophageal cancer Neg Hx    Rectal cancer Neg Hx    Stomach cancer Neg Hx    Social History   Socioeconomic History   Marital status: Married    Spouse name: Not on file   Number of children: 1   Years of education: Not on file  Highest education level: Bachelor's degree (e.g., BA, AB, BS)  Occupational History   Occupation: Retired    Associate Professor: RETIRED    Comment: Counsellor  Tobacco Use   Smoking status: Never   Smokeless tobacco: Never  Vaping Use   Vaping status: Never Used  Substance and Sexual Activity   Alcohol use: Yes    Comment: occassionally   Drug use: No   Sexual activity: Not Currently  Other Topics Concern   Not on file  Social History Narrative   Daily caffeine use- 3 drinks daily   Occupation: retired, was Nurse, 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   Activity: Patient does not get regular exercise, has gym membership   Diet: good water, daily fruits/vegetables   Social Determinants of Health   Financial Resource Strain: Low Risk  (09/15/2022)   Overall Financial Resource Strain (CARDIA)    Difficulty of Paying Living Expenses: Not very hard  Food Insecurity: No Food Insecurity (09/15/2022)   Hunger Vital Sign    Worried About Running Out of Food in the Last Year: Never true    Ran Out of Food in the Last Year: Never true  Transportation Needs: No Transportation Needs (09/15/2022)   PRAPARE - Administrator, Civil Service (Medical): No    Lack of Transportation (Non-Medical): No  Physical Activity: Insufficiently Active (09/15/2022)   Exercise Vital Sign    Days of Exercise per Week: 2 days    Minutes of Exercise per Session: 20 min  Stress: No Stress Concern Present  (09/15/2022)   [employer] of Occupational Health - Occupational Stress Questionnaire    Feeling of Stress : Not at all  Social Connections: Socially Integrated (09/15/2022)   Social Connection and Isolation Panel [NHANES]    Frequency of Communication with Friends and Family: Three times a week    Frequency of Social Gatherings with Friends and Family: More than three times a week    Attends Religious Services: 1 to 4 times per year    Active Member of [employer] or Organizations: Yes    Attends Engineer, structural: More than 4 times per year    Marital Status: Married    Tobacco Counseling Counseling given: Not Answered   Clinical Intake:  Pre-visit preparation completed: Yes  Pain : No/denies pain     Nutritional Risks: None Diabetes: Yes CBG done?: No Did pt. bring in CBG monitor from home?: No  How often do you need to have someone help you when you read instructions, pamphlets, or other written materials from your doctor or pharmacy?: 1 - Never  Interpreter Needed?: No  Information entered by :: NAllen LPN   Activities of Daily Living    09/15/2022    1:14 PM  In your present state of health, do you have any difficulty performing the following activities:  Hearing? 0  Vision? 0  Difficulty concentrating or making decisions? 0  Walking or climbing stairs? 0  Dressing or bathing? 0  Doing errands, shopping? 0  Preparing Food and eating ? N  Using the Toilet? N  In the past six months, have you accidently leaked urine? Y  Comment when coughs hard  Do you have problems with loss of bowel control? N  Managing your Medications? N  Managing your Finances? N  Housekeeping or managing your Housekeeping? N    Patient Care Team: Eustaquio Boyden, MD as PCP - General (Family Medicine) Kieth Brightly, MD (  General Surgery) Vilinda Flake, [facility] (Inactive) as Pharmacist (Pharmacist) Tressia Danas, MD as Consulting Physician  (Gastroenterology)  Indicate any recent Medical Services you may have received from other than [facility] providers in the past year (date may be approximate).     Assessment:   This is a routine wellness examination for [place].  Hearing/Vision screen Hearing Screening - Comments:: Denies hearing issues Vision Screening - Comments:: Regular eye exams, [facility]  Dietary issues and exercise activities discussed:     Goals Addressed             This Visit's Progress    Patient Stated       09/16/2022, denies any goals at this time       Depression Screen    09/16/2022    8:47 AM 05/29/2022    9:31 AM 09/04/2021   12:31 PM 09/03/2020   11:01 AM 08/04/2019   10:44 AM 07/15/2018   11:28 AM 06/30/2017   10:30 AM  PHQ 2/9 Scores  PHQ - 2 Score 0 0 0 0 0 0 0  PHQ- 9 Score 0 1    0     Fall Risk    09/15/2022    1:14 PM 05/29/2022    9:31 AM 09/04/2021   12:30 PM 09/03/2021    8:18 AM 09/03/2020   11:01 AM  Fall Risk   Falls in the past year? 0 0 1 1 0  Comment   lost balance in the dark    Number falls in past yr: 0  0 0   Injury with Fall? 0  0 0   Risk for fall due to : Medication side effect  Medication side effect    Follow up Falls prevention discussed;Falls evaluation completed  Falls evaluation completed;Education provided;Falls prevention discussed      MEDICARE RISK AT HOME:  Medicare Risk at Home - 09/16/22 0847     Any stairs in or around the home? Yes    If so, are there any without handrails? Yes    Home free of loose throw rugs in walkways, pet beds, electrical cords, etc? Yes    Adequate lighting in your home to reduce risk of falls? Yes    Life alert? No    Use of a cane, walker or w/c? No    Grab bars in the bathroom? No    Shower chair or bench in shower? No    Elevated toilet seat or a handicapped toilet? Yes             TIMED UP AND GO:  Was the test performed?  No    Cognitive Function:    07/15/2018   11:26 AM 04/29/2016   10:52 AM   MMSE - Mini Mental State Exam  Orientation to time 5 5  Orientation to Place 5 5  Registration 3 3  Attention/ Calculation 0 0  Recall 3 3  Language- name 2 objects 0 0  Language- repeat 1 1  Language- follow 3 step command 0 3  Language- read & follow direction 0 0  Write a sentence 0 0  Copy design 0 0  Total score 17 20        09/16/2022    8:47 AM 09/04/2021   12:32 PM  6CIT Screen  What Year? 0 points 0 points  What month? 0 points 0 points  What time? 0 points 0 points  Count back from 20 0 points 0 points  Months in  reverse 0 points 0 points  Repeat phrase 0 points 0 points  Total Score 0 points 0 points    Immunizations Immunization History  Administered Date(s) Administered   COVID-19, mRNA, vaccine(Comirnaty)12 years and older 11/19/2021   Fluad Quad(high Dose 65+) 11/24/2018, 11/19/2021   Influenza Split 10/23/2010, 12/16/2011   Influenza Whole 11/23/2007, 10/31/2008, 12/28/2009   Influenza, High Dose Seasonal PF 12/24/2016, 12/02/2017, 12/06/2019, 11/22/2020   Influenza,inj,Quad PF,6+ Mos 12/26/2012, 12/29/2013, 12/27/2014, 11/15/2015   PFIZER Comirnaty(Gray Top)Covid-19 Tri-Sucrose Vaccine 09/08/2020, 11/20/2021   PFIZER(Purple Top)SARS-COV-2 Vaccination 03/16/2019, 04/06/2019, 11/23/2019   Pfizer Covid-19 Vaccine Bivalent Booster 57yrs & up 12/16/2020   Pneumococcal Conjugate-13 03/07/2015   Pneumococcal Polysaccharide-23 09/22/2011   Td 02/28/1998, 05/09/2008    TDAP status: Due, Education has been provided regarding the importance of this vaccine. Advised may receive this vaccine at local pharmacy or Health Dept. Aware to provide a copy of the vaccination record if obtained from local pharmacy or Health Dept. Verbalized acceptance and understanding.  Flu Vaccine status: Due, Education has been provided regarding the importance of this vaccine. Advised may receive this vaccine at local pharmacy or Health Dept. Aware to provide a copy of the vaccination  record if obtained from local pharmacy or Health Dept. Verbalized acceptance and understanding.  Pneumococcal vaccine status: Up to date  Covid-19 vaccine status: Completed vaccines  Qualifies for Shingles Vaccine? Yes   Zostavax completed No   Shingrix Completed?: No.    Education has been provided regarding the importance of this vaccine. Patient has been advised to call insurance company to determine out of pocket expense if they have not yet received this vaccine. Advised may also receive vaccine at local pharmacy or Health Dept. Verbalized acceptance and understanding.  Screening Tests Health Maintenance  Topic Date Due   Zoster Vaccines- Shingrix (1 of 2) Never done   DTaP/Tdap/Td (3 - Tdap) 05/10/2018   Pneumonia Vaccine 84+ Years old (3 of 3 - PPSV23 or PCV20) 03/06/2020   COVID-19 Vaccine (7 - 2023-24 season) 03/22/2022   Diabetic kidney evaluation - eGFR measurement  09/04/2022   Diabetic kidney evaluation - Urine ACR  09/04/2022   INFLUENZA VACCINE  09/10/2022   HEMOGLOBIN A1C  11/28/2022   OPHTHALMOLOGY EXAM  01/15/2023   Colonoscopy  03/09/2023   FOOT EXAM  05/29/2023   MAMMOGRAM  06/04/2023   Medicare Annual Wellness (AWV)  09/16/2023   DEXA SCAN  Completed   Hepatitis C Screening  Completed   HPV VACCINES  Aged Out    Health Maintenance  Health Maintenance Due  Topic Date Due   Zoster Vaccines- Shingrix (1 of 2) Never done   DTaP/Tdap/Td (3 - Tdap) 05/10/2018   Pneumonia Vaccine 65+ Years old (3 of 3 - PPSV23 or PCV20) 03/06/2020   COVID-19 Vaccine (7 - 2023-24 season) 03/22/2022   Diabetic kidney evaluation - eGFR measurement  09/04/2022   Diabetic kidney evaluation - Urine ACR  09/04/2022   INFLUENZA VACCINE  09/10/2022    Colorectal cancer screening: Type of screening: Colonoscopy. Completed 03/08/2020. Repeat every 3 years  Mammogram status: Completed 06/04/2022. Repeat every year  Bone Density status: Completed 09/09/2020.  Lung Cancer Screening: (Low  Dose CT Chest recommended if Age 39-80 years, 20 pack-year currently smoking OR have quit w/in 15years.) does not qualify.   Lung Cancer Screening Referral: no  Additional Screening:  Hepatitis C Screening: does qualify; Completed 03/01/2015  Vision Screening: Recommended annual ophthalmology exams for early detection of glaucoma and other disorders of the  eye. Is the patient up to date with their annual eye exam?  Yes  Who is the provider or what is the name of the office in which the patient attends annual eye exams? [facility] If pt is not established with a provider, would they like to be referred to a provider to establish care? No .   Dental Screening: Recommended annual dental exams for proper oral hygiene  Diabetic Foot Exam: Diabetic Foot Exam: Completed 05/28/2022  Community Resource Referral / Chronic Care Management: CRR required this visit?  No   CCM required this visit?  No     Plan:     I have personally reviewed and noted the following in the patient's chart:   Medical and social history Use of alcohol, tobacco or illicit drugs  Current medications and supplements including opioid prescriptions. Patient is not currently taking opioid prescriptions. Functional ability and status Nutritional status Physical activity Advanced directives List of other physicians Hospitalizations, surgeries, and ER visits in previous 12 months Vitals Screenings to include cognitive, depression, and falls Referrals and appointments  In addition, I have reviewed and discussed with patient certain preventive protocols, quality metrics, and best practice recommendations. A written personalized care plan for preventive services as well as general preventive health recommendations were provided to patient.     Barb Merino, LPN   07/12/9526   After Visit Summary: (MyChart) Due to this being a telephonic visit, the after visit summary with patients personalized plan was  offered to patient via MyChart   Nurse Notes: none

## 2022-09-17 ENCOUNTER — Encounter: Payer: PPO | Admitting: Family Medicine

## 2022-09-22 DIAGNOSIS — H16142 Punctate keratitis, left eye: Secondary | ICD-10-CM | POA: Diagnosis not present

## 2022-09-23 ENCOUNTER — Ambulatory Visit (INDEPENDENT_AMBULATORY_CARE_PROVIDER_SITE_OTHER): Payer: PPO | Admitting: Family Medicine

## 2022-09-23 ENCOUNTER — Encounter: Payer: Self-pay | Admitting: Family Medicine

## 2022-09-23 VITALS — BP 134/78 | HR 78 | Temp 97.5°F | Ht 68.0 in | Wt 161.4 lb

## 2022-09-23 DIAGNOSIS — M8589 Other specified disorders of bone density and structure, multiple sites: Secondary | ICD-10-CM

## 2022-09-23 DIAGNOSIS — G720 Drug-induced myopathy: Secondary | ICD-10-CM | POA: Diagnosis not present

## 2022-09-23 DIAGNOSIS — Z85820 Personal history of malignant melanoma of skin: Secondary | ICD-10-CM

## 2022-09-23 DIAGNOSIS — E1169 Type 2 diabetes mellitus with other specified complication: Secondary | ICD-10-CM | POA: Diagnosis not present

## 2022-09-23 DIAGNOSIS — T466X5A Adverse effect of antihyperlipidemic and antiarteriosclerotic drugs, initial encounter: Secondary | ICD-10-CM | POA: Diagnosis not present

## 2022-09-23 DIAGNOSIS — N393 Stress incontinence (female) (male): Secondary | ICD-10-CM | POA: Diagnosis not present

## 2022-09-23 DIAGNOSIS — Z794 Long term (current) use of insulin: Secondary | ICD-10-CM | POA: Diagnosis not present

## 2022-09-23 DIAGNOSIS — R946 Abnormal results of thyroid function studies: Secondary | ICD-10-CM

## 2022-09-23 DIAGNOSIS — K862 Cyst of pancreas: Secondary | ICD-10-CM | POA: Diagnosis not present

## 2022-09-23 DIAGNOSIS — K863 Pseudocyst of pancreas: Secondary | ICD-10-CM | POA: Diagnosis not present

## 2022-09-23 DIAGNOSIS — K118 Other diseases of salivary glands: Secondary | ICD-10-CM

## 2022-09-23 DIAGNOSIS — I10 Essential (primary) hypertension: Secondary | ICD-10-CM | POA: Diagnosis not present

## 2022-09-23 DIAGNOSIS — E785 Hyperlipidemia, unspecified: Secondary | ICD-10-CM

## 2022-09-23 DIAGNOSIS — Z0000 Encounter for general adult medical examination without abnormal findings: Secondary | ICD-10-CM

## 2022-09-23 DIAGNOSIS — Z7189 Other specified counseling: Secondary | ICD-10-CM

## 2022-09-23 MED ORDER — LANTUS SOLOSTAR 100 UNIT/ML ~~LOC~~ SOPN: 30.0000 [IU] | PEN_INJECTOR | Freq: Every day | SUBCUTANEOUS | 3 refills | Status: DC

## 2022-09-23 MED ORDER — GLIPIZIDE 5 MG PO TABS: 5.0000 mg | ORAL_TABLET | Freq: Every day | ORAL | 4 refills | Status: DC

## 2022-09-23 MED ORDER — BD PEN NEEDLE NANO 2ND GEN 32G X 4 MM MISC: 4 refills | Status: DC

## 2022-09-23 MED ORDER — METFORMIN HCL ER 500 MG PO TB24: 500.0000 mg | ORAL_TABLET | Freq: Two times a day (BID) | ORAL | 4 refills | Status: DC

## 2022-09-23 MED ORDER — ENALAPRIL MALEATE 20 MG PO TABS: 20.0000 mg | ORAL_TABLET | Freq: Every day | ORAL | 4 refills | Status: DC

## 2022-09-23 MED ORDER — AMLODIPINE BESYLATE 5 MG PO TABS: ORAL_TABLET | ORAL | 4 refills | Status: DC

## 2022-09-23 NOTE — Assessment & Plan Note (Addendum)
Intolerant to all statins tried. See above.

## 2022-09-23 NOTE — Progress Notes (Signed)
Ph: [phone] Fax: [phone]   Patient ID: Elizabeth Curtis, female    DOB: [birth year], 72 y.o.   MRN: [id number]  This visit was conducted in person.  BP 134/78   Pulse 78   Temp (!) 97.5 F (36.4 C) (Temporal)   Ht 5\' 8"  (1.727 m)   Wt 161 lb 6 oz (73.2 kg)   SpO2 96%   BMI 24.54 kg/m    CC: CPE Subjective:   HPI: Elizabeth Curtis is a 72 y.o. female presenting on 09/23/2022 for Annual Exam (MCR prt 2 [AWV- 09/16/22].)   Saw health advisor last week for medicare wellness visit. Note reviewed.   No results found.  Flowsheet Row Clinical Support from 09/16/2022 in [facility]  PHQ-2 Total Score 0          09/15/2022    1:14 PM 05/29/2022    9:31 AM 09/04/2021   12:30 PM 09/03/2021    8:18 AM 09/03/2020   11:01 AM  Fall Risk   Falls in the past year? 0 0 1 1 0  Comment   lost balance in the dark    Number falls in past yr: 0  0 0   Injury with Fall? 0  0 0   Risk for fall due to : Medication side effect  Medication side effect    Follow up Falls prevention discussed;Falls evaluation completed  Falls evaluation completed;Education provided;Falls prevention discussed     DM - continues glipizide 5mg  BID with meals, lantus 20u at 10pm, metformin XR 500mg  BID. Did not tolerate Trulicity trial, h/o recurrent UTIs so wants to avoid SGLT2i.  Diet deteriorated.  Freestyle Libre 3 CGM: has not been using.   Abnormal pancreas MRI 08/2022 followed by GI - multiple cystic lesions throughout pancreas - side branch IPMNs vs pseudocysts, no change from prior imaging, rec f/u GI 2 yrs. Also with gallstones.   Preventative: COLONOSCOPY 02/2020 - TAx3, HPx1, hemorrhoids, diverticulosis, lipoma, rpt 44yrs [facility] Falconer)  Well woman Dr Shawnie Pons 05/2021. Latest pap normal, absent endocervical zone due to atrophy  LMP - 2010. Menopausal.  Mammogram - 05/2022 - Birads1 @ [place]  DEXA 03/27/15 Osteopenia  DEXA 09/2020 - T -1.8 spine, -2.0 R femur neck - mild  progression from last check 2017. Continues 5000 IU vit D nightly. Drinks kefir, eats chees, leafy greens - reviewed goal 1200mg /day  Lung cancer screening - not eligible  Flu shot yearly. COVID vaccine Pfizer 03/2019 x2, booster 11/2019, 08/2020, bivalent 12/2020, 11/2021 Tetanus 2000, 2010.  Pneumovax 09/2011 (felt sick afterwards). Prevnar-13 02/2015  Shingrix - discussed, to check at pharmacy  RSV - discussed, to check at pharmacy  Advanced directives: living will scanned into chart 2017. Husband Iantha Fallen then son Thayer Ohm are HCPOA. Does not want prolonged life support if terminally or incurably ill.  Seat belt use discussed Sunscreen use discussed, no changing skin lesions. H/o melanoma 2012, sees Dr Adolphus Birchwood derm yearly.  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 Activity: 2.5 mi most days walking Diet: good water, daily fruits/vegetables     Relevant past medical, surgical, family and social history reviewed and updated as indicated. Interim medical history since our last visit reviewed. Allergies and medications reviewed and updated. Outpatient  Medications Prior to Visit  Medication Sig Dispense Refill   b complex vitamins capsule Take 1 capsule by mouth every Monday, Wednesday, and Friday.     Cholecalciferol (VITAMIN D3) 125 MCG (5000 UT) CAPS Take 1 capsule (5,000 Units total) by mouth every Monday, Wednesday, and Friday. 30 capsule    Continuous Blood Gluc Sensor (FREESTYLE LIBRE 3 SENSOR) MISC Use as instructed to check blood sugar daily 2 each 3   glucose blood (FREESTYLE LITE) test strip USE TO CHECK SUGAR ONCE DAILY AND AS NEEDED. DX:E11.65 100 each 3   MISC NATURAL PRODUCTS PO Take by mouth. [facility]     Omega-3 Fatty Acids (FISH OIL) 1000 MG  CAPS Take 2 capsules (2,000 mg total) by mouth daily.  0   OVER THE COUNTER MEDICATION CBD PM 1000 mg with Melatonin as needed for sleep     TURMERIC CURCUMIN PO Take 750 mg by mouth daily.     ZADITOR 0.025 % ophthalmic solution Apply to eye.     amLODipine (NORVASC) 5 MG tablet TAKE 1 TABLET BY MOUTH EVERYDAY AT BEDTIME 90 tablet 3   enalapril (VASOTEC) 20 MG tablet Take 1 tablet (20 mg total) by mouth daily. 90 tablet 3   glipiZIDE (GLUCOTROL) 5 MG tablet Take 1 tablet (5 mg total) by mouth 2 (two) times daily before a meal. 180 tablet 3   insulin glargine (LANTUS SOLOSTAR) 100 UNIT/ML Solostar Pen Inject 20 Units into the skin daily. 15 mL 4   Insulin Pen Needle (BD PEN NEEDLE NANO 2ND GEN) 32G X 4 MM MISC USE AS DIRECTED ONCE DAILY 100 each 3   LORazepam (ATIVAN) 1 MG tablet Take 1 hour prior to MRI/MRCP 1 tablet 0   metFORMIN (GLUCOPHAGE-XR) 500 MG 24 hr tablet Take 1 tablet (500 mg total) by mouth in the morning and at bedtime. 180 tablet 3   No facility-administered medications prior to visit.     Per HPI unless specifically indicated in ROS section below Review of Systems  Constitutional:  Negative for activity change, appetite change, chills, fatigue, fever and unexpected weight change.  HENT:  Negative for hearing loss.   Eyes:  Negative for visual disturbance.  Respiratory:  Negative for cough, chest tightness, shortness of breath and wheezing.   Cardiovascular:  Positive for palpitations (occ flutter). Negative for chest pain and leg swelling.  Gastrointestinal:  Negative for abdominal distention, abdominal pain, blood in stool, constipation, diarrhea, nausea and vomiting.  Genitourinary:  Negative for difficulty urinating and hematuria.  Musculoskeletal:  Negative for arthralgias, myalgias and neck pain.  Skin:  Negative for rash.  Neurological:  Positive for dizziness (occ orthostatic). Negative for seizures, syncope and headaches.  Hematological:  Negative for adenopathy.  Bruises/bleeds easily.  Psychiatric/Behavioral:  Negative for dysphoric mood. The patient is not nervous/anxious.   0  Objective:  BP 134/78   Pulse 78   Temp (!) 97.5 F (36.4 C) (Temporal)   Ht 5\' 8"  (1.727 m)   Wt 161 lb 6 oz (73.2 kg)   SpO2 96%   BMI 24.54 kg/m   Wt Readings from Last 3 Encounters:  09/23/22 161 lb 6 oz (73.2 kg)  05/29/22 162 lb 6 oz (73.7 kg)  09/15/21 156 lb 8 oz (71 kg)      Physical Exam Vitals and nursing note reviewed.  Constitutional:      Appearance: Normal appearance. She is not ill-appearing.  HENT:     Head: Normocephalic and atraumatic.  Right Ear: Tympanic membrane, ear canal and external ear normal. There is no impacted cerumen.     Left Ear: Tympanic membrane, ear canal and external ear normal. There is no impacted cerumen.     Nose: Nose normal.     Mouth/Throat:     Mouth: Mucous membranes are moist.     Pharynx: Oropharynx is clear. No oropharyngeal exudate or posterior oropharyngeal erythema.  Eyes:     General:        Right eye: No discharge.        Left eye: No discharge.     Extraocular Movements: Extraocular movements intact.     Conjunctiva/sclera: Conjunctivae normal.     Pupils: Pupils are equal, round, and reactive to light.  Neck:     Thyroid: No thyroid mass or thyromegaly.     Vascular: No carotid bruit.  Cardiovascular:     Rate and Rhythm: Normal rate and regular rhythm.     Pulses: Normal pulses.     Heart sounds: Normal heart sounds. No murmur heard. Pulmonary:     Effort: Pulmonary effort is normal. No respiratory distress.     Breath sounds: Normal breath sounds. No wheezing, rhonchi or rales.  Abdominal:     General: Bowel sounds are normal. There is no distension.     Palpations: Abdomen is soft. There is no mass.     Tenderness: There is no abdominal tenderness. There is no guarding or rebound.     Hernia: No hernia is present.  Musculoskeletal:     Cervical back: Normal range of motion and neck  supple. No rigidity.     Right lower leg: No edema.     Left lower leg: No edema.  Lymphadenopathy:     Cervical: No cervical adenopathy.  Skin:    General: Skin is warm and dry.     Findings: No rash.  Neurological:     General: No focal deficit present.     Mental Status: She is alert. Mental status is at baseline.  Psychiatric:        Mood and Affect: Mood normal.        Behavior: Behavior normal.       Results for orders placed or performed in visit on 09/16/22  VITAMIN D 25 Hydroxy (Vit-D Deficiency, Fractures)  Result Value Ref Range   VITD 59.55 30.00 - 100.00 ng/mL  T4, free  Result Value Ref Range   Free T4 0.99 0.60 - 1.60 ng/dL  TSH  Result Value Ref Range   TSH 0.59 0.35 - 5.50 uIU/mL  Comprehensive metabolic panel  Result Value Ref Range   Sodium 139 135 - 145 mEq/L   Potassium 3.8 3.5 - 5.1 mEq/L   Chloride 101 96 - 112 mEq/L   CO2 28 19 - 32 mEq/L   Glucose, Bld 124 (H) 70 - 99 mg/dL   BUN 16 6 - 23 mg/dL   Creatinine, Ser 7.84 0.40 - 1.20 mg/dL   Total Bilirubin 0.3 0.2 - 1.2 mg/dL   Alkaline Phosphatase 57 39 - 117 U/L   AST 15 0 - 37 U/L   ALT 18 0 - 35 U/L   Total Protein 6.9 6.0 - 8.3 g/dL   Albumin 4.5 3.5 - 5.2 g/dL   GFR 69.62 >95.28 mL/min   Calcium 9.8 8.4 - 10.5 mg/dL  Lipid panel  Result Value Ref Range   Cholesterol 230 (H) 0 - 200 mg/dL   Triglycerides 413.2 (H) 0.0 - 149.0  mg/dL   HDL 47.42 >59.56 mg/dL   VLDL 38.7 0.0 - 56.4 mg/dL   LDL Cholesterol 332 (H) 0 - 99 mg/dL   Total CHOL/HDL Ratio 5    NonHDL 180.27   Hemoglobin A1c  Result Value Ref Range   Hgb A1c MFr Bld 8.2 (H) 4.6 - 6.5 %   Lab Results  Component Value Date   WBC 7.8 10/21/2020   HGB 13.1 10/21/2020   HCT 39.0 10/21/2020   MCV 86.5 10/21/2020   PLT 256.0 10/21/2020   Assessment & Plan:   Problem List Items Addressed This Visit     Healthcare maintenance - Primary (Chronic)    Preventative protocols reviewed and updated unless pt declined. Discussed  healthy diet and lifestyle.       Advanced care planning/counseling discussion (Chronic)    Previously discussed.       Type 2 diabetes mellitus with other specified complication (HCC)    Chronic, deteriorated, attributes to dietary liberties.  Encouraged renewed efforts forwards diabetic diet.  Will drop glipizide to 5mg  in am and slowly titrate lantus by 2u every 3 days if average fasting sugar >150, to goal 30u daily.       Relevant Medications   enalapril (VASOTEC) 20 MG tablet   glipiZIDE (GLUCOTROL) 5 MG tablet   insulin glargine (LANTUS SOLOSTAR) 100 UNIT/ML Solostar Pen   metFORMIN (GLUCOPHAGE-XR) 500 MG 24 hr tablet   Essential hypertension, benign    Chronic, stable. Continue current regimen       Relevant Medications   amLODipine (NORVASC) 5 MG tablet   enalapril (VASOTEC) 20 MG tablet   History of malignant melanoma    Sees dermatologist yearly.       Hyperlipidemia associated with type 2 diabetes mellitus (HCC)    Chronic, LDL above goal in diabetic. H/o statin intolerance.  Did not tolerate welchol. Discussed Zetia, she desires to continue working on dietary changes for lipid control. Encouraged increased fiber and legumes in diet.  The 10-year ASCVD risk score (Arnett DK, et al., 2019) is: 31.2%   Values used to calculate the score:     Age: 30 years     Sex: Female     Is Non-Hispanic African American: No     Diabetic: Yes     Tobacco smoker: No     Systolic Blood Pressure: 134 mmHg     Is BP treated: Yes     HDL Cholesterol: 49.5 mg/dL     Total Cholesterol: 230 mg/dL       Relevant Medications   amLODipine (NORVASC) 5 MG tablet   enalapril (VASOTEC) 20 MG tablet   glipiZIDE (GLUCOTROL) 5 MG tablet   insulin glargine (LANTUS SOLOSTAR) 100 UNIT/ML Solostar Pen   metFORMIN (GLUCOPHAGE-XR) 500 MG 24 hr tablet   Abnormal thyroid function test    TFTs remain stable.      Osteopenia    Consider updated DEXA 2025 given progression previously noted.   She continues good calcium intake in diet and vit D 5000 international units  daily.       Parotid mass   Statin myopathy    Intolerant to all statins tried. See above.       Stress incontinence    Mild.       Cyst and pseudocyst of pancreas    Followed by GI Leonides Schanz) - latest MRI 08/2022 showing stability.  I suspect pancreas changes contributing to diabetes.  See above re diabetes plan.  Meds ordered this encounter  Medications   amLODipine (NORVASC) 5 MG tablet    Sig: TAKE 1 TABLET BY MOUTH EVERYDAY AT BEDTIME    Dispense:  90 tablet    Refill:  4   enalapril (VASOTEC) 20 MG tablet    Sig: Take 1 tablet (20 mg total) by mouth daily.    Dispense:  90 tablet    Refill:  4   glipiZIDE (GLUCOTROL) 5 MG tablet    Sig: Take 1 tablet (5 mg total) by mouth daily before breakfast.    Dispense:  90 tablet    Refill:  4   insulin glargine (LANTUS SOLOSTAR) 100 UNIT/ML Solostar Pen    Sig: Inject 30 Units into the skin daily.    Dispense:  30 mL    Refill:  3   Insulin Pen Needle (BD PEN NEEDLE NANO 2ND GEN) 32G X 4 MM MISC    Sig: USE AS DIRECTED ONCE DAILY    Dispense:  100 each    Refill:  4   metFORMIN (GLUCOPHAGE-XR) 500 MG 24 hr tablet    Sig: Take 1 tablet (500 mg total) by mouth in the morning and at bedtime.    Dispense:  180 tablet    Refill:  4    No orders of the defined types were placed in this encounter.   Patient Instructions  Start titrating lantus up by 2 units every 3 days if average fasting sugar >150, to max 30u lantus nightly.  Drop glipizide to 5mg  in am with breakfast.  If interested, check with pharmacy about new 2 shot shingles series (shingrix) as well as RSV.  Consider Zetia cholesterol medicine. Work on low cholesterol diet, increased fiber and legumes.  Return in 4-6 months for diabetes follow up visit   Follow up plan: Return in about 6 months (around 03/26/2023) for follow up visit.  Eustaquio Boyden, MD

## 2022-09-23 NOTE — Assessment & Plan Note (Signed)
Mild.

## 2022-09-23 NOTE — Assessment & Plan Note (Signed)
Chronic, deteriorated, attributes to dietary liberties.  Encouraged renewed efforts forwards diabetic diet.  Will drop glipizide to 5mg  in am and slowly titrate lantus by 2u every 3 days if average fasting sugar >150, to goal 30u daily.

## 2022-09-23 NOTE — Assessment & Plan Note (Addendum)
Consider updated DEXA 2025 given progression previously noted.  She continues good calcium intake in diet and vit D 5000 international units  daily.

## 2022-09-23 NOTE — Assessment & Plan Note (Signed)
Chronic, stable. Continue current regimen. 

## 2022-09-23 NOTE — Assessment & Plan Note (Signed)
TFTs remain stable. 

## 2022-09-23 NOTE — Assessment & Plan Note (Signed)
Chronic, LDL above goal in diabetic. H/o statin intolerance.  Did not tolerate welchol. Discussed Zetia, she desires to continue working on dietary changes for lipid control. Encouraged increased fiber and legumes in diet.  The 10-year ASCVD risk score (Arnett DK, et al., 2019) is: 31.2%   Values used to calculate the score:     Age: 72 years     Sex: Female     Is Non-Hispanic African American: No     Diabetic: Yes     Tobacco smoker: No     Systolic Blood Pressure: 134 mmHg     Is BP treated: Yes     HDL Cholesterol: 49.5 mg/dL     Total Cholesterol: 230 mg/dL

## 2022-09-23 NOTE — Patient Instructions (Addendum)
Start titrating lantus up by 2 units every 3 days if average fasting sugar >150, to max 30u lantus nightly.  Drop glipizide to 5mg  in am with breakfast.  If interested, check with pharmacy about new 2 shot shingles series (shingrix) as well as RSV.  Consider Zetia cholesterol medicine. Work on low cholesterol diet, increased fiber and legumes.  Return in 4-6 months for diabetes follow up visit

## 2022-09-23 NOTE — Assessment & Plan Note (Signed)
Previously discussed.

## 2022-09-23 NOTE — Assessment & Plan Note (Signed)
Followed by GI Leonides Schanz) - latest MRI 08/2022 showing stability.  I suspect pancreas changes contributing to diabetes.  See above re diabetes plan.

## 2022-09-23 NOTE — Assessment & Plan Note (Signed)
Sees dermatologist yearly 

## 2022-09-23 NOTE — Assessment & Plan Note (Signed)
Preventative protocols reviewed and updated unless pt declined. Discussed healthy diet and lifestyle.  

## 2022-10-02 ENCOUNTER — Encounter: Payer: Self-pay | Admitting: Family Medicine

## 2022-10-02 DIAGNOSIS — Z794 Long term (current) use of insulin: Secondary | ICD-10-CM

## 2022-10-02 MED ORDER — FREESTYLE LIBRE 3 SENSOR MISC
11 refills | Status: DC
Start: 2022-10-02 — End: 2022-12-04

## 2022-10-07 ENCOUNTER — Encounter: Payer: Self-pay | Admitting: Family Medicine

## 2022-10-15 ENCOUNTER — Encounter: Payer: Self-pay | Admitting: Internal Medicine

## 2022-10-15 ENCOUNTER — Ambulatory Visit: Payer: PPO | Admitting: Internal Medicine

## 2022-10-15 VITALS — BP 140/68 | HR 76 | Ht 68.0 in | Wt 167.0 lb

## 2022-10-15 DIAGNOSIS — K863 Pseudocyst of pancreas: Secondary | ICD-10-CM

## 2022-10-15 DIAGNOSIS — R935 Abnormal findings on diagnostic imaging of other abdominal regions, including retroperitoneum: Secondary | ICD-10-CM

## 2022-10-15 DIAGNOSIS — K862 Cyst of pancreas: Secondary | ICD-10-CM | POA: Diagnosis not present

## 2022-10-15 DIAGNOSIS — K222 Esophageal obstruction: Secondary | ICD-10-CM

## 2022-10-15 NOTE — Progress Notes (Signed)
Reason for Consultation:  Pancreas cyst  IMPRESSION:  Pancreas cysts suggestive of either side branch IPMNs or pseudocysts Elevated lipase without associated symptoms History of diverticulitis History of colon polyps History of Schatzki's ring s/p dilation Cholelithiasis She was first diagnosed with a pancreas cysts in 2022.  No clear diagnosis of pancreatitis has been established in the past.  Subsequent imaging has shown stability of the pancreas cysts and suggest that the cysts are most likely sidebranch IPMN's or pseudocyst.  Will continue surveillance of these pancreas cysts with her next MRI due in 2 years.  I did discuss this case with my biliary colic Dr. Meridee Score who agreed with continued surveillance.  Patient is due for a colonoscopy in 02/2023 for polyp surveillance.  PLAN: - MRI/MRCP w/contrast in 08/2024 - Colonoscopy 02/2023 for polyp surveillance - RTC in 08/2024  HPI: Elizabeth Curtis is a 72 y.o. female with history of DM, arthritis, Schatzki's ring, and pancreas cyst presents for follow up of pancreas cyst  Interval History: Denies ab pain. Her last episode of ab pain was in 10/2020, which was suspected to be due to diverticulitis. She has cut out all soda and sugar/artificial sugar. She has lost some weight with these dietary changes. Denies N&V or dysphagia. She drinks alcohol once every few months. Denies acid reflux. Denies constipation or diarrhea  Wt Readings from Last 3 Encounters:  10/15/22 167 lb (75.8 kg)  09/23/22 161 lb 6 oz (73.2 kg)  05/29/22 162 lb 6 oz (73.7 kg)   Labs 10/2020: CBC nml. Lipase elevated at 231.  Labs 11/2020: Lipase nml.   Labs 09/2022: CMP unremarkable. TSH nml. FT4 nml. HbA1C 8.2%. Vit D nml.   CT A/P w/contrast 10/22/20: IMPRESSION: Mild, acute, uncomplicated sigmoid diverticulitis. No evidence of obstruction or perforation. Background moderate sigmoid diverticulosis. Cholelithiasis. Multiple cystic lesions identified  throughout the pancreas measuring up to 21 mm within the pancreatic neck. Differential considerations are as listed above. Comparison with prior examinations would be helpful in determining chronicity. If none are available, recommend follow up pre and post contrast MRI/MRCP or pancreatic protocol CT in 2 years. This recommendation follows ACR consensus guidelines: Management of Incidental Pancreatic Cysts: A White Paper of the ACR Incidental Findings Committee. J Am Coll Radiol 2017;14:911-923. Aortic Atherosclerosis (ICD10-I70.0).  MRI/MRCP w/contrast 12/09/20: IMPRESSION: Multiple cystic lesions of the pancreas without nodular internal enhancement or main duct dilation. Some of these appear to represent side branch intraductal papillary mucinous neoplasms. The dominant lesion without definitive ductal communication. For this reason suggest initial follow-up at 6 month interval. If ductal communication can be established this may increase the subsequent follow-up intervals. Ultimately surveillance may be warranted for up to 10 years. MRCP sequences are limited due to respiratory motion. No substantial dilation of the main pancreatic duct or biliary duct. Cholelithiasis and biliary sludge without acute biliary process. Mild hepatic steatosis.  MRI/MRCP w/contrast 06/09/21: IMPRESSION: 1. Stable appearance of cystic pancreatic lesions, largest of which is 2.2 cm in the neck of the pancreas. Lesions likely reflect intraductal papillary mucinous neoplasms. Ductal communication is seen with many of these areas on the current study. At follow-up MRI/MRCP is suggested at 1 year interval. 2. Cholelithiasis without evidence of biliary duct dilation or choledocholithiasis.  MRI/MRCP w/contrast 08/28/22: IMPRESSION: 1. Numerous fluid signal cystic lesions are again seen scattered throughout the pancreas, largest in the superior pancreatic head/neck measuring 1.9 x 1.5 cm, unchanged. Numerous additional  lesions 1 cm or smaller throughout the pancreas. No solid  component or suspicious contrast enhancement. These are most consistent with small side branch IPMNs or pseudocysts. As there is no observed increased risk of malignancy for such lesions smaller than 2 cm, especially given well established initial stability, no further follow-up or characterization is required. 2. Cholelithiasis.  Colonoscopy 03/08/20: - Hemorrhoids found on perianal exam. - Non-bleeding internal hemorrhoids. - Diverticulosis in the sigmoid colon. - Two 2 to 6 mm polyps in the rectum, removed with a cold snare. Resected and retrieved. - One 3 mm polyp in the proximal descending colon, removed with a cold biopsy forceps. Resected and retrieved. - One 3 mm polyp in the proximal transverse colon, removed with a cold snare. Resected and retrieved. - Large lipoma in the mid transverse colon. - The examination was otherwise normal on direct and retroflexion views. Path: 1. Surgical [P], colon, transverse x 1, polyp - TUBULAR ADENOMA. - NO HIGH GRADE DYSPLASIA OR CARCINOMA. - 2. Surgical [P], colon, descending x 1, polyp - TUBULAR ADENOMA. - NO HIGH GRADE DYSPLASIA OR CARCINOMA 3. Surgical [P], colon, rectum, polyp - TUBULAR ADENOMA(S). - NO HIGH GRADE DYSPLASIA OR CARCINOMA. 4. Surgical [P], colon, rectum, polyp - HYPERPLASTIC POLYP. - NO ADENOMATOUS CHANGE OR CARCINOMA.  EGD 12/13/20: - Non- obstructing Schatzki ring. Dilation with a 16- 17- 18 mm balloon dilator was performed to 18 mm. There was minimal dilation with a fully inflated balloon. The dilation site was examined and showed mild mucosal disruption. Biopsied.  - No esophageal malignancy seen on this exam.  - A few gastric polyps. Likely fundic gland polyps. Biopsied.  - Normal examined duodenum. Path: 1. Surgical [P], gastric polyps biopsies BENIGN FUNDIC GLAND GASTRIC POLYPS. NEGATIVE FOR DYSPLASIA AND MALIGNANCY. 2. Surgical [P], distal  esophagus NONSPECIFIC INFLAMMATION WITHOUT GOBLET CELL METAPLASIA IN FRAGMENTS OF GASTRIC MUCOSA. ADJACENT PORTIONS OF NORMAL ESOPHAGEAL MUCOSA. THERE ARE NO DIAGNOSTIC FEATURES OF BARRETT'S ESOPHAGUS AND EOSINOPHILIC ESOPHAGITIS. 3. Surgical [P], mid and proximal esophagus FRAGMENTS OF NORMAL ESOPHAGEAL MUCOSA. THERE ARE NO DIAGNOSTIC FEATURES OF EOSINOPHILIC ESOPHAGITIS.  Past Medical History:  Diagnosis Date   Allergy    Arthritis    OA   Blood transfusion without reported diagnosis    with C Section    Cataract 2020   removed both eyes    Chronic low back pain    Diabetes mellitus    Diffuse cystic mastopathy    Disturbance of skin sensation    Diverticulosis of colon (without mention of hemorrhage)    Dysphagia, unspecified(787.20)    Essential hypertension, benign 1995   H/O cystitis 2011   History of colonic polyps    Hyperplastic   History of pelvic ultrasound 03/05   Uterus 13 X 7 X 8cm, Fibroid 2.6cm   HLD (hyperlipidemia)    borderline    Lipoma    Mammographic microcalcification 2013   MDD (major depressive disorder) 11/22/2008   Melanoma (HCC)    upper abdomin left SN removed under axilla   Osteopenia 03/2015   DEXA hip -1.7, spine -1.7   Plantar wart    Schatzki's ring 07/23/08   EGD dilated O/W normal (Dr. 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   Vision abnormalities     Past Surgical History:  Procedure Laterality Date   BREAST BIOPSY Right 07/2011   rec rpt 6 mo, neg   BREAST BIOPSY Right 06/07/2019   [place] core  coil clip - fibrocystic change with apocrine metaplasia   CESAREAN SECTION  1982  [facility] presentation   COLONOSCOPY  2010   [facility], rec rpt 10 yrs   COLONOSCOPY  02/2020   multiple polyps, hem, diverticulosis, lipoma, rpt 3 yrs ([facility])   ESOPHAGOGASTRODUODENOSCOPY  2010   [facility], dilated schatzki ring   ESOPHAGOGASTRODUODENOSCOPY  01/2021   schatzki ring, dilated, benign biopsies  ([facility])   EYE SURGERY Left 2012   cataract   INTRAOCULAR LENS EXCHANGE Left 05/2017   uveitis-glaucoma-hypheme syndrome ([place])   KNEE ARTHROSCOPY W/ MENISCAL REPAIR [facility] 01/2014   [facility]   MELANOMA EXCISION  2012   upper abdomen   POLYPECTOMY     2008 [facility], 2010 [facility]      Current Outpatient Medications  Medication Sig Dispense Refill   amLODipine (NORVASC) 5 MG tablet TAKE 1 TABLET BY MOUTH EVERYDAY AT BEDTIME 90 tablet 4   b complex vitamins capsule Take 1 capsule by mouth every Monday, Wednesday, and Friday.     Cholecalciferol (VITAMIN D3) 125 MCG (5000 UT) CAPS Take 1 capsule (5,000 Units total) by mouth every Monday, Wednesday, and Friday. 30 capsule    Continuous Glucose Sensor (FREESTYLE LIBRE 3 SENSOR) MISC Use as instructed to check blood sugar daily 2 each 11   enalapril (VASOTEC) 20 MG tablet Take 1 tablet (20 mg total) by mouth daily. 90 tablet 4   glipiZIDE (GLUCOTROL) 5 MG tablet Take 1 tablet (5 mg total) by mouth daily before breakfast. 90 tablet 4   glucose blood (FREESTYLE LITE) test strip USE TO CHECK SUGAR ONCE DAILY AND AS NEEDED. DX:E11.65 100 each 3   insulin glargine (LANTUS SOLOSTAR) 100 UNIT/ML Solostar Pen Inject 30 Units into the skin daily. 30 mL 3   Insulin Pen Needle (BD PEN NEEDLE NANO 2ND GEN) 32G X 4 MM MISC USE AS DIRECTED ONCE DAILY 100 each 4   metFORMIN (GLUCOPHAGE-XR) 500 MG 24 hr tablet Take 1 tablet (500 mg total) by mouth in the morning and at bedtime. 180 tablet 4   MISC NATURAL PRODUCTS PO Take by mouth. MCT Wellness     Omega-3 Fatty Acids (FISH OIL) 1000 MG CAPS Take 2 capsules (2,000 mg total) by mouth daily.  0   OVER THE COUNTER MEDICATION CBD PM 1000 mg with Melatonin as needed for sleep     TURMERIC CURCUMIN PO Take 750 mg by mouth daily.     ZADITOR 0.025 % ophthalmic solution Apply to eye.     No current facility-administered medications for this visit.    Allergies as of 10/15/2022 - Review Complete 09/23/2022  Allergen  Reaction Noted   Avandia [rosiglitazone] Other (See Comments) 09/23/2012   Colesevelam Nausea Only and Other (See Comments) 10/18/2020   Other Other (See Comments) 09/22/2012   Statins Other (See Comments) 09/14/2011   Trulicity [dulaglutide] Other (See Comments) 10/12/2016    Family History  Problem Relation Age of Onset   Diabetes Mother    Hyperlipidemia Mother    Hypertension Mother    Mental illness Mother        Personality disorder   Kidney disease Father        failure, (Diaylsis)   Diabetes Father    Diabetes Brother    Coronary artery disease Brother 14       CAD, hospitalized freq, PTCA   Gout Brother    Hypertension Brother    Cancer Maternal Aunt 50       breast   Breast cancer Maternal Aunt 71   Cancer Paternal Aunt 41  breast   Breast cancer Paternal Aunt 35   ALS Paternal Aunt    Stroke Paternal Grandfather    Stroke Maternal Grandmother    Breast cancer Maternal Aunt 1   Colon cancer Neg Hx    Colon polyps Neg Hx    Esophageal cancer Neg Hx    Rectal cancer Neg Hx    Stomach cancer Neg Hx     Physical Exam: General:   Alert,  well-nourished, pleasant and cooperative in NAD Head:  Normocephalic and atraumatic. Lungs:  Clear throughout to auscultation.   No wheezes. Heart:  Regular rate and rhythm; no murmurs. Abdomen:  Soft, nontender, nondistended, normal bowel sounds, no rebound or guarding Extremities:  No clubbing or edema. Neurologic:  Alert and  oriented x4;  grossly nonfocal Skin:  Intact without significant lesions or rashes. Psych:  Alert and cooperative. Normal mood and affect.  Eulah Pont, MD 10/15/2022, 12:12 PM  I spent 35 minutes of time, including in depth chart review, independent review of results as outlined above, communicating results with the patient directly, face-to-face time with the patient, coordinating care, and ordering studies and medications as appropriate, and documentation.

## 2022-10-15 NOTE — Patient Instructions (Addendum)
You are due for a colonoscopy in January 2025  If your blood pressure at your visit was 140/90 or greater, please contact your primary care physician to follow up on this.  _______________________________________________________  If you are age 73 or older, your body mass index should be between 23-30. Your Body mass index is 25.39 kg/m. If this is out of the aforementioned range listed, please consider follow up with your Primary Care Provider.  If you are age 20 or younger, your body mass index should be between 19-25. Your Body mass index is 25.39 kg/m. If this is out of the aformentioned range listed, please consider follow up with your Primary Care Provider.   ________________________________________________________  The [facility] providers would like to encourage you to use [facility] to communicate with providers for non-urgent requests or questions.  Due to long hold times on the telephone, sending your provider a message by [facility] may be a faster and more efficient way to get a response.  Please allow 48 business hours for a response.  Please remember that this is for non-urgent requests.  _______________________________________________________    Thank you for entrusting me with your care and for choosing [facility], Dr. Eulah Pont

## 2022-11-04 ENCOUNTER — Encounter: Payer: Self-pay | Admitting: Family Medicine

## 2022-11-16 DIAGNOSIS — H16142 Punctate keratitis, left eye: Secondary | ICD-10-CM | POA: Diagnosis not present

## 2022-11-16 DIAGNOSIS — H18832 Recurrent erosion of cornea, left eye: Secondary | ICD-10-CM | POA: Diagnosis not present

## 2022-11-21 ENCOUNTER — Other Ambulatory Visit: Payer: Self-pay | Admitting: Family Medicine

## 2022-12-03 ENCOUNTER — Encounter: Payer: Self-pay | Admitting: Family Medicine

## 2022-12-04 MED ORDER — FREESTYLE LIBRE 3 PLUS SENSOR MISC: 1 refills | Status: DC

## 2022-12-12 ENCOUNTER — Encounter: Payer: Self-pay | Admitting: Family Medicine

## 2022-12-16 DIAGNOSIS — D485 Neoplasm of uncertain behavior of skin: Secondary | ICD-10-CM | POA: Diagnosis not present

## 2022-12-16 DIAGNOSIS — D2272 Melanocytic nevi of left lower limb, including hip: Secondary | ICD-10-CM | POA: Diagnosis not present

## 2022-12-16 DIAGNOSIS — D225 Melanocytic nevi of trunk: Secondary | ICD-10-CM | POA: Diagnosis not present

## 2022-12-16 DIAGNOSIS — L570 Actinic keratosis: Secondary | ICD-10-CM | POA: Diagnosis not present

## 2022-12-16 DIAGNOSIS — D2261 Melanocytic nevi of right upper limb, including shoulder: Secondary | ICD-10-CM | POA: Diagnosis not present

## 2022-12-16 DIAGNOSIS — L821 Other seborrheic keratosis: Secondary | ICD-10-CM | POA: Diagnosis not present

## 2022-12-16 DIAGNOSIS — D2262 Melanocytic nevi of left upper limb, including shoulder: Secondary | ICD-10-CM | POA: Diagnosis not present

## 2022-12-16 DIAGNOSIS — Z85820 Personal history of malignant melanoma of skin: Secondary | ICD-10-CM | POA: Diagnosis not present

## 2023-01-18 DIAGNOSIS — M7672 Peroneal tendinitis, left leg: Secondary | ICD-10-CM | POA: Diagnosis not present

## 2023-01-18 DIAGNOSIS — M25572 Pain in left ankle and joints of left foot: Secondary | ICD-10-CM | POA: Diagnosis not present

## 2023-01-18 DIAGNOSIS — E119 Type 2 diabetes mellitus without complications: Secondary | ICD-10-CM | POA: Diagnosis not present

## 2023-01-19 ENCOUNTER — Encounter: Payer: Self-pay | Admitting: Family Medicine

## 2023-01-21 DIAGNOSIS — E119 Type 2 diabetes mellitus without complications: Secondary | ICD-10-CM | POA: Diagnosis not present

## 2023-01-21 DIAGNOSIS — H35371 Puckering of macula, right eye: Secondary | ICD-10-CM | POA: Diagnosis not present

## 2023-01-21 DIAGNOSIS — H43813 Vitreous degeneration, bilateral: Secondary | ICD-10-CM | POA: Diagnosis not present

## 2023-01-21 DIAGNOSIS — Z961 Presence of intraocular lens: Secondary | ICD-10-CM | POA: Diagnosis not present

## 2023-01-21 LAB — HM DIABETES EYE EXAM

## 2023-01-27 ENCOUNTER — Other Ambulatory Visit: Payer: Self-pay | Admitting: Family Medicine

## 2023-01-27 DIAGNOSIS — Z794 Long term (current) use of insulin: Secondary | ICD-10-CM

## 2023-01-27 NOTE — Telephone Encounter (Signed)
FreeStyle Libre 3+ Last filled:  12/31/22, #2 ea Last OV:  09/23/22, CPE Next OV:  03/26/23, 6 mo DM f/u

## 2023-02-17 DIAGNOSIS — D2272 Melanocytic nevi of left lower limb, including hip: Secondary | ICD-10-CM | POA: Diagnosis not present

## 2023-02-17 DIAGNOSIS — L905 Scar conditions and fibrosis of skin: Secondary | ICD-10-CM | POA: Diagnosis not present

## 2023-02-26 ENCOUNTER — Encounter: Payer: Self-pay | Admitting: Family Medicine

## 2023-03-05 ENCOUNTER — Encounter: Payer: Self-pay | Admitting: Family Medicine

## 2023-03-05 DIAGNOSIS — D2372 Other benign neoplasm of skin of left lower limb, including hip: Secondary | ICD-10-CM

## 2023-03-05 HISTORY — DX: Other benign neoplasm of skin of left lower limb, including hip: D23.72

## 2023-03-05 NOTE — Telephone Encounter (Signed)
Printed reports and placed in Dr Timoteo Expose box.

## 2023-03-26 ENCOUNTER — Encounter: Payer: Self-pay | Admitting: Family Medicine

## 2023-03-26 ENCOUNTER — Ambulatory Visit (INDEPENDENT_AMBULATORY_CARE_PROVIDER_SITE_OTHER): Payer: PPO | Admitting: Family Medicine

## 2023-03-26 VITALS — BP 134/84 | HR 86 | Temp 98.2°F | Ht 68.0 in | Wt 163.2 lb

## 2023-03-26 DIAGNOSIS — E1140 Type 2 diabetes mellitus with diabetic neuropathy, unspecified: Secondary | ICD-10-CM | POA: Diagnosis not present

## 2023-03-26 DIAGNOSIS — K863 Pseudocyst of pancreas: Secondary | ICD-10-CM

## 2023-03-26 DIAGNOSIS — Z794 Long term (current) use of insulin: Secondary | ICD-10-CM

## 2023-03-26 DIAGNOSIS — K862 Cyst of pancreas: Secondary | ICD-10-CM | POA: Diagnosis not present

## 2023-03-26 DIAGNOSIS — Z85820 Personal history of malignant melanoma of skin: Secondary | ICD-10-CM | POA: Diagnosis not present

## 2023-03-26 DIAGNOSIS — E1169 Type 2 diabetes mellitus with other specified complication: Secondary | ICD-10-CM

## 2023-03-26 DIAGNOSIS — D2372 Other benign neoplasm of skin of left lower limb, including hip: Secondary | ICD-10-CM | POA: Diagnosis not present

## 2023-03-26 LAB — POCT GLYCOSYLATED HEMOGLOBIN (HGB A1C): Hemoglobin A1C: 7.1 % — AB (ref 4.0–5.6)

## 2023-03-26 LAB — VITAMIN B12: Vitamin B-12: 1103 pg/mL — ABNORMAL HIGH (ref 211–911)

## 2023-03-26 MED ORDER — GLIPIZIDE 5 MG PO TABS: 2.5000 mg | ORAL_TABLET | Freq: Two times a day (BID) | ORAL | 3 refills | Status: DC

## 2023-03-26 NOTE — Assessment & Plan Note (Addendum)
Chronic, improving.  She is hesitant to try SGLT2i and actos, has previously had trouble with trulicity.  Continue metformin, glipizide 2.5mg  bid, lantus - discussed titrating lantus vs toujeo as she is having trouble with injecting 30u at a time.  Reviewed CGM readings.  She asks about endoscopic duodenal mucosal ablation for diabetes and NAFLD - I'm not aware of any local offerings for this but will review article she brings Check B12 levels with worsening neuropathy

## 2023-03-26 NOTE — Assessment & Plan Note (Signed)
Trial ALA.

## 2023-03-26 NOTE — Assessment & Plan Note (Signed)
Recent L lateral hip with severe atypia s/p full excision

## 2023-03-26 NOTE — Assessment & Plan Note (Signed)
Appreciate GI care with planned rpt MRI 2026

## 2023-03-26 NOTE — Patient Instructions (Addendum)
Call [facility] (Dr Leonides Schanz) to schedule an appointment at [phone].   B12 blood test today  Consider trial of alpha lipoic acid 600mg  once daily for diabetic neuropathy.  Check on insurance coverage of Toujeo daily insulin (more concentrated insulin).  Return in 6 months for physical/medicare wellness visit

## 2023-03-26 NOTE — Assessment & Plan Note (Addendum)
Back to q45mo derm checks

## 2023-03-26 NOTE — Progress Notes (Signed)
Ph: [phone] Fax: [phone]   Patient ID: Elizabeth Curtis, female    DOB: [birth year], 73 y.o.   MRN: [id number]  This visit was conducted in person.  BP 134/84   Pulse 86   Temp 98.2 F (36.8 C) (Oral)   Ht 5\' 8"  (1.727 m)   Wt 163 lb 4 oz (74 kg)   SpO2 97%   BMI 24.82 kg/m    CC: 6 mo DM f/u visit  Subjective:   HPI: Elizabeth Curtis is a 73 y.o. female presenting on 03/26/2023 for Medical Management of Chronic Issues (Here for 6 mo DM f/u.)   GI following for pancreas cyst planned surveillance next MRI 08/2024.  Due for colonoscopy - # provided to call and schedule appt   Seeing podiatry for L ankle and foot pain thought arthritis related. Treated with ASO ankle brace  Seeing dermatology - recent junctional nevus with severe atypia needed rpt biopsy due to close margins.   DM - does regularly check sugars and brings CGM data as per below. Compliant with antihyperglycemic regimen which includes: glipizide 2.5mg  bid with meals, metformin XR 500mg  bid, lantus 30u qhs. Denies low sugars or hypoglycemic symptoms. Denies blurry vision. Notes foot paresthesias at night. Managing with TENS unit and electric socks for neuropathy. Last diabetic eye exam 01/2023. Glucometer brand: Freestyle Lite. Last foot exam: 05/2022. DSME: completed remotely. CGM data: Freestyle Libre 3 30 day average sugar: 151, time in range 81%, hypoglycemia 0%, stage 1 hyperglycemia 18%, stage 2 hyperglycemia 1%.   Lab Results  Component Value Date   HGBA1C 7.1 (A) 03/26/2023  No results found for: "VITAMINB12"  Diabetic Foot Exam - Simple   Simple Foot Form Diabetic Foot exam was performed with the following findings: Yes 03/26/2023  8:00 AM  Visual Inspection No deformities, no ulcerations, no other skin breakdown bilaterally: Yes Sensation Testing Intact to touch and monofilament testing bilaterally: Yes Pulse Check Posterior Tibialis and Dorsalis pulse intact bilaterally: Yes Comments No  claudication    Lab Results  Component Value Date   MICROALBUR <0.7 09/03/2021         Relevant past medical, surgical, family and social history reviewed and updated as indicated. Interim medical history since our last visit reviewed. Allergies and medications reviewed and updated. Outpatient Medications Prior to Visit  Medication Sig Dispense Refill   amLODipine (NORVASC) 5 MG tablet TAKE 1 TABLET BY MOUTH EVERYDAY AT BEDTIME 90 tablet 4   b complex vitamins capsule Take 1 capsule by mouth every Monday, Wednesday, and Friday.     Cholecalciferol (VITAMIN D3) 125 MCG (5000 UT) CAPS Take 1 capsule (5,000 Units total) by mouth every Monday, Wednesday, and Friday. 30 capsule    Continuous Glucose Sensor (FREESTYLE LIBRE 3 PLUS SENSOR) MISC CHANGE/APPLY SENSOR TO SKIN EVERY 15 DAYS 2 each 6   enalapril (VASOTEC) 20 MG tablet Take 1 tablet (20 mg total) by mouth daily. 90 tablet 4   glucose blood (FREESTYLE LITE) test strip USE TO CHECK SUGAR ONCE DAILY AND AS NEEDED. DX:E11.65 100 each 3   insulin glargine (LANTUS SOLOSTAR) 100 UNIT/ML Solostar Pen Inject 30 Units into the skin daily. 30 mL 3   Insulin Pen Needle (BD PEN NEEDLE NANO 2ND GEN) 32G X 4 MM MISC USE AS DIRECTED ONCE DAILY 100 each 4   metFORMIN (GLUCOPHAGE-XR) 500 MG 24 hr tablet Take 1 tablet (500 mg total) by mouth in the morning and at bedtime. 180 tablet 4  MISC NATURAL PRODUCTS PO Take by mouth. SNS GlycoPhase     Omega-3 Fatty Acids (FISH OIL) 1000 MG CAPS Take 2 capsules (2,000 mg total) by mouth daily.  0   OVER THE COUNTER MEDICATION CBD PM 1000 mg with Melatonin as needed for sleep     TURMERIC CURCUMIN PO Take 750 mg by mouth daily.     ZADITOR 0.025 % ophthalmic solution Apply to eye.     glipiZIDE (GLUCOTROL) 5 MG tablet TAKE 1 TABLET (5 MG TOTAL) BY MOUTH TWICE A DAY BEFORE MEALS 180 tablet 3   MISC NATURAL PRODUCTS PO Take by mouth. MCT Wellness     No facility-administered medications prior to visit.      Per HPI unless specifically indicated in ROS section below Review of Systems  Objective:  BP 134/84   Pulse 86   Temp 98.2 F (36.8 C) (Oral)   Ht 5\' 8"  (1.727 m)   Wt 163 lb 4 oz (74 kg)   SpO2 97%   BMI 24.82 kg/m   Wt Readings from Last 3 Encounters:  03/26/23 163 lb 4 oz (74 kg)  10/15/22 167 lb (75.8 kg)  09/23/22 161 lb 6 oz (73.2 kg)      Physical Exam Vitals and nursing note reviewed.  Constitutional:      Appearance: Normal appearance. She is not ill-appearing.  HENT:     Head: Normocephalic and atraumatic.     Mouth/Throat:     Mouth: Mucous membranes are moist.     Pharynx: Oropharynx is clear. No oropharyngeal exudate or posterior oropharyngeal erythema.  Eyes:     Extraocular Movements: Extraocular movements intact.     Conjunctiva/sclera: Conjunctivae normal.     Pupils: Pupils are equal, round, and reactive to light.  Cardiovascular:     Rate and Rhythm: Normal rate and regular rhythm.     Pulses: Normal pulses.     Heart sounds: Normal heart sounds. No murmur heard. Pulmonary:     Effort: Pulmonary effort is normal. No respiratory distress.     Breath sounds: Normal breath sounds. No wheezing, rhonchi or rales.  Musculoskeletal:     Right lower leg: No edema.     Left lower leg: No edema.  Skin:    General: Skin is warm and dry.     Findings: No rash.  Neurological:     Mental Status: She is alert.  Psychiatric:        Mood and Affect: Mood normal.        Behavior: Behavior normal.       Results for orders placed or performed in visit on 03/26/23  POCT glycosylated hemoglobin (Hb A1C)   Collection Time: 03/26/23  7:38 AM  Result Value Ref Range   Hemoglobin A1C 7.1 (A) 4.0 - 5.6 %   HbA1c POC (<> result, manual entry)     HbA1c, POC (prediabetic range)     HbA1c, POC (controlled diabetic range)      Assessment & Plan:   Problem List Items Addressed This Visit     Type 2 diabetes mellitus with other specified complication (HCC) -  Primary   Chronic, improving.  She is hesitant to try SGLT2i and actos, has previously had trouble with trulicity.  Continue metformin, glipizide 2.5mg  bid, lantus - discussed titrating lantus vs toujeo as she is having trouble with injecting 30u at a time.  Reviewed CGM readings.  She asks about endoscopic duodenal mucosal ablation for diabetes and NAFLD - I'm  not aware of any local offerings for this but will review article she brings Check B12 levels with worsening neuropathy      Relevant Medications   glipiZIDE (GLUCOTROL) 5 MG tablet   Other Relevant Orders   POCT glycosylated hemoglobin (Hb A1C) (Completed)   Vitamin B12   History of malignant melanoma   Back to q60mo derm checks      Cyst and pseudocyst of pancreas   Appreciate GI care with planned rpt MRI 2026      Dysplastic nevus of lower extremity, left   Recent L lateral hip with severe atypia s/p full excision       Type 2 diabetes mellitus with diabetic neuropathy, unspecified (HCC)   Trial ALA.       Relevant Medications   glipiZIDE (GLUCOTROL) 5 MG tablet     Meds ordered this encounter  Medications   glipiZIDE (GLUCOTROL) 5 MG tablet    Sig: Take 0.5 tablets (2.5 mg total) by mouth 2 (two) times daily before a meal.    Dispense:  90 tablet    Refill:  3    Note new dose    Orders Placed This Encounter  Procedures   Vitamin B12   POCT glycosylated hemoglobin (Hb A1C)    Patient Instructions  Call [facility] (Dr Leonides Schanz) to schedule an appointment at [phone].   B12 blood test today  Consider trial of alpha lipoic acid 600mg  once daily for diabetic neuropathy.  Check on insurance coverage of Toujeo daily insulin (more concentrated insulin).  Return in 6 months for physical/medicare wellness visit  Follow up plan: Return in about 6 months (around 09/23/2023), or if symptoms worsen or fail to improve, for annual exam, prior fasting for blood work, medicare wellness visit.  Eustaquio Boyden,  MD

## 2023-04-01 ENCOUNTER — Encounter: Payer: Self-pay | Admitting: Family Medicine

## 2023-04-12 ENCOUNTER — Encounter: Payer: Self-pay | Admitting: Internal Medicine

## 2023-04-13 ENCOUNTER — Encounter: Payer: Self-pay | Admitting: Internal Medicine

## 2023-04-22 ENCOUNTER — Other Ambulatory Visit: Payer: Self-pay | Admitting: Family Medicine

## 2023-04-22 ENCOUNTER — Encounter: Payer: Self-pay | Admitting: Internal Medicine

## 2023-04-22 DIAGNOSIS — Z1231 Encounter for screening mammogram for malignant neoplasm of breast: Secondary | ICD-10-CM

## 2023-04-22 NOTE — Telephone Encounter (Signed)
 Called patient & rescheduled date for both EGD & colon on 06/07/23 at 7:30 am in the [facility] with Dr. Leonides Schanz. Advised her to keep PV as scheduled. Pt verbalized all understanding.

## 2023-04-27 ENCOUNTER — Ambulatory Visit (AMBULATORY_SURGERY_CENTER): Admitting: *Deleted

## 2023-04-27 ENCOUNTER — Telehealth: Payer: Self-pay | Admitting: *Deleted

## 2023-04-27 VITALS — Ht 68.0 in | Wt 163.0 lb

## 2023-04-27 DIAGNOSIS — R1310 Dysphagia, unspecified: Secondary | ICD-10-CM

## 2023-04-27 DIAGNOSIS — K222 Esophageal obstruction: Secondary | ICD-10-CM

## 2023-04-27 DIAGNOSIS — Z860100 Personal history of colon polyps, unspecified: Secondary | ICD-10-CM

## 2023-04-27 NOTE — Progress Notes (Signed)
 Pt's name and DOB verified at the beginning of the pre-visit wit 2 identifiers   Pt denies any difficulty with ambulating,sitting, laying down or rolling side to side  Pt has no issues with ambulation   Pt has no issues moving head neck or swallowing  No egg or soy allergy known to patient   No issues known to pt with past sedation with any surgeries or procedures  Pt denies having issues being intubated  No FH of Malignant Hyperthermia  Pt is not on diet pills or shots  Pt is not on home 02   Pt is not on blood thinners   Pt denies issues with constipation   Pt is not on dialysis  Pt denise any abnormal heart rhythms   Pt denies any upcoming cardiac testing  Chart not reviewed by CRNA prior to PV  Visit by phone  Pt states weight is 163 lb  IInstructions reviewed. Pt given, [facility] main # and MD on call # prior to instructions.  Pt states understanding of instructions. Instructed to review again prior to procedure. Pt states they

## 2023-04-27 NOTE — Telephone Encounter (Signed)
 Attempt to reach pt for pre-visit. LM with call back #.   Will attempt other number in profile  Second attempt to reach pt with home phone no answer. Will try mobile # in 5 min for pre-vist   Third attempt to reach pt

## 2023-05-11 ENCOUNTER — Encounter: Admitting: Internal Medicine

## 2023-05-11 HISTORY — PX: COLONOSCOPY WITH ESOPHAGOGASTRODUODENOSCOPY (EGD): SHX5779

## 2023-05-13 MED ORDER — MAGNESIUM GLYCINATE ADVANCED 120 MG PO CAPS: 1.0000 | ORAL_CAPSULE | Freq: Every day | ORAL | Status: AC

## 2023-05-13 MED ORDER — VITAMIN B-12 1000 MCG PO TABS
1000.0000 ug | ORAL_TABLET | ORAL | Status: DC
Start: 2023-05-14 — End: 2023-09-27

## 2023-05-13 MED ORDER — TOUJEO SOLOSTAR 300 UNIT/ML ~~LOC~~ SOPN: 30.0000 [IU] | PEN_INJECTOR | Freq: Every day | SUBCUTANEOUS | 1 refills | Status: DC

## 2023-05-13 MED ORDER — MAGNESIUM GLYCINATE ADVANCED 120 MG PO CAPS: 1.0000 | ORAL_CAPSULE | Freq: Every day | ORAL | Status: DC

## 2023-05-31 ENCOUNTER — Encounter: Payer: Self-pay | Admitting: Internal Medicine

## 2023-06-02 ENCOUNTER — Encounter: Payer: Self-pay | Admitting: Internal Medicine

## 2023-06-02 DIAGNOSIS — Z85820 Personal history of malignant melanoma of skin: Secondary | ICD-10-CM | POA: Diagnosis not present

## 2023-06-02 DIAGNOSIS — L538 Other specified erythematous conditions: Secondary | ICD-10-CM | POA: Diagnosis not present

## 2023-06-02 DIAGNOSIS — D2272 Melanocytic nevi of left lower limb, including hip: Secondary | ICD-10-CM | POA: Diagnosis not present

## 2023-06-02 DIAGNOSIS — D2262 Melanocytic nevi of left upper limb, including shoulder: Secondary | ICD-10-CM | POA: Diagnosis not present

## 2023-06-02 DIAGNOSIS — D225 Melanocytic nevi of trunk: Secondary | ICD-10-CM | POA: Diagnosis not present

## 2023-06-02 DIAGNOSIS — L820 Inflamed seborrheic keratosis: Secondary | ICD-10-CM | POA: Diagnosis not present

## 2023-06-02 DIAGNOSIS — D485 Neoplasm of uncertain behavior of skin: Secondary | ICD-10-CM | POA: Diagnosis not present

## 2023-06-02 DIAGNOSIS — D2261 Melanocytic nevi of right upper limb, including shoulder: Secondary | ICD-10-CM | POA: Diagnosis not present

## 2023-06-02 DIAGNOSIS — R208 Other disturbances of skin sensation: Secondary | ICD-10-CM | POA: Diagnosis not present

## 2023-06-02 HISTORY — PX: OTHER SURGICAL HISTORY: SHX169

## 2023-06-07 ENCOUNTER — Encounter

## 2023-06-07 ENCOUNTER — Ambulatory Visit (AMBULATORY_SURGERY_CENTER): Admitting: Internal Medicine

## 2023-06-07 ENCOUNTER — Encounter: Payer: Self-pay | Admitting: Internal Medicine

## 2023-06-07 VITALS — BP 191/77 | HR 60 | Temp 97.9°F | Resp 13 | Ht 68.0 in | Wt 163.0 lb

## 2023-06-07 DIAGNOSIS — Z860100 Personal history of colon polyps, unspecified: Secondary | ICD-10-CM

## 2023-06-07 DIAGNOSIS — D175 Benign lipomatous neoplasm of intra-abdominal organs: Secondary | ICD-10-CM

## 2023-06-07 DIAGNOSIS — K317 Polyp of stomach and duodenum: Secondary | ICD-10-CM

## 2023-06-07 DIAGNOSIS — E119 Type 2 diabetes mellitus without complications: Secondary | ICD-10-CM | POA: Diagnosis not present

## 2023-06-07 DIAGNOSIS — R1310 Dysphagia, unspecified: Secondary | ICD-10-CM

## 2023-06-07 DIAGNOSIS — K222 Esophageal obstruction: Secondary | ICD-10-CM | POA: Diagnosis not present

## 2023-06-07 DIAGNOSIS — Z1211 Encounter for screening for malignant neoplasm of colon: Secondary | ICD-10-CM

## 2023-06-07 DIAGNOSIS — K2970 Gastritis, unspecified, without bleeding: Secondary | ICD-10-CM | POA: Diagnosis not present

## 2023-06-07 DIAGNOSIS — D123 Benign neoplasm of transverse colon: Secondary | ICD-10-CM | POA: Diagnosis not present

## 2023-06-07 DIAGNOSIS — D120 Benign neoplasm of cecum: Secondary | ICD-10-CM

## 2023-06-07 DIAGNOSIS — K5730 Diverticulosis of large intestine without perforation or abscess without bleeding: Secondary | ICD-10-CM | POA: Diagnosis not present

## 2023-06-07 DIAGNOSIS — K648 Other hemorrhoids: Secondary | ICD-10-CM

## 2023-06-07 DIAGNOSIS — K635 Polyp of colon: Secondary | ICD-10-CM | POA: Diagnosis not present

## 2023-06-07 MED ORDER — PANTOPRAZOLE SODIUM 40 MG PO TBEC: 40.0000 mg | DELAYED_RELEASE_TABLET | Freq: Every day | ORAL | 0 refills | Status: DC

## 2023-06-07 MED ORDER — SODIUM CHLORIDE 0.9 % IV SOLN
500.0000 mL | Freq: Once | INTRAVENOUS | Status: DC
Start: 2023-06-07 — End: 2023-06-07

## 2023-06-07 NOTE — Progress Notes (Signed)
 Called to room to assist during endoscopic procedure.  Patient ID and intended procedure confirmed with present staff. Received instructions for my participation in the procedure from the performing physician.

## 2023-06-07 NOTE — Op Note (Signed)
 [facility] Patient Name: Elizabeth Curtis Procedure Date: 06/07/2023 8:28 AM MRN: [id number] Endoscopist: Freada Jacobs [place] , , [phone] Age: 73 Referring MD:  Date of Birth: 01/08/1951 Gender: Female Account #: [id number] Procedure:                Upper GI endoscopy Indications:              Dysphagia Medicines:                Monitored Anesthesia Care Procedure:                Pre-Anesthesia Assessment:                           - Prior to the procedure, a History and Physical                            was performed, and patient medications and                            allergies were reviewed. The patient's tolerance of                            previous anesthesia was also reviewed. The risks                            and benefits of the procedure and the sedation                            options and risks were discussed with the patient.                            All questions were answered, and informed consent                            was obtained. Prior Anticoagulants: The patient has                            taken no anticoagulant or antiplatelet agents. ASA                            Grade Assessment: III - A patient with severe                            systemic disease. After reviewing the risks and                            benefits, the patient was deemed in satisfactory                            condition to undergo the procedure.                           After obtaining informed consent, the endoscope was  passed under direct vision. Throughout the                            procedure, the patient's blood pressure, pulse, and                            oxygen saturations were monitored continuously. The                            GIF [id number] #[id number] was introduced through the                            mouth, and advanced to the second part of duodenum.                            The upper GI endoscopy was  accomplished without                            difficulty. The patient tolerated the procedure                            well. Scope In: Scope Out: Findings:                 One benign-appearing, intrinsic moderate                            (circumferential scarring or stenosis; an endoscope                            may pass) stenosis was found at the                            gastroesophageal junction. This stenosis measured                            less than one cm (in length). The stenosis was                            traversed. A TTS dilator was passed through the                            scope. Dilation with an 18-19-20 mm balloon dilator                            was performed to 18 mm. The dilation site was                            examined and showed mild mucosal disruption.                           Localized mild inflammation characterized by                            congestion (edema), erosions and  erythema was found                            in the gastric antrum. Biopsies were taken with a                            cold forceps for histology.                           A few small sessile polyps with no bleeding were                            found in the duodenal bulb. Biopsies were taken                            with a cold forceps for histology. Complications:            No immediate complications. Estimated Blood Loss:     Estimated blood loss was minimal. Impression:               - Benign-appearing esophageal stenosis. Dilated.                           - Gastritis. Biopsied.                           - A few duodenal polyps. Biopsied. Recommendation:           - Await pathology results.                           - Use Protonix (pantoprazole) 40 mg PO daily for 8                            weeks.                           - Perform a colonoscopy today. Dr Pedro Bourgeois "[place]" [place],  06/07/2023 9:24:51 AM

## 2023-06-07 NOTE — Progress Notes (Signed)
 Pt's states no medical or surgical changes since previsit or office visit.

## 2023-06-07 NOTE — Progress Notes (Signed)
 Vss nad trans to pacu

## 2023-06-07 NOTE — Op Note (Signed)
 [facility] Patient Name: Elizabeth Curtis Procedure Date: 06/07/2023 8:21 AM MRN: [id number] Endoscopist: Freada Jacobs [place] , , [phone] Age: 73 Referring MD:  Date of Birth: 11-Apr-1950 Gender: Female Account #: [id number] Procedure:                Colonoscopy Indications:              High risk colon cancer surveillance: Personal                            history of colonic polyps Medicines:                Monitored Anesthesia Care Procedure:                Pre-Anesthesia Assessment:                           - Prior to the procedure, a History and Physical                            was performed, and patient medications and                            allergies were reviewed. The patient's tolerance of                            previous anesthesia was also reviewed. The risks                            and benefits of the procedure and the sedation                            options and risks were discussed with the patient.                            All questions were answered, and informed consent                            was obtained. Prior Anticoagulants: The patient has                            taken no anticoagulant or antiplatelet agents. ASA                            Grade Assessment: III - A patient with severe                            systemic disease. After reviewing the risks and                            benefits, the patient was deemed in satisfactory                            condition to undergo the procedure.  After obtaining informed consent, the colonoscope                            was passed under direct vision. Throughout the                            procedure, the patient's blood pressure, pulse, and                            oxygen saturations were monitored continuously. The                            CF [id number] #[id number] was introduced through the anus                            and advanced to the the  terminal ileum. The                            colonoscopy was performed without difficulty. The                            patient tolerated the procedure well. The quality                            of the bowel preparation was good. The terminal                            ileum, ileocecal valve, appendiceal orifice, and                            rectum were photographed. Scope In: 8:53:56 AM Scope Out: 9:17:27 AM Scope Withdrawal Time: 0 hours 17 minutes 52 seconds  Total Procedure Duration: 0 hours 23 minutes 31 seconds  Findings:                 The terminal ileum appeared normal.                           A 3 mm polyp was found in the cecum. The polyp was                            sessile. The polyp was removed with a cold snare.                            Resection and retrieval were complete.                           A 2 mm polyp was found in the cecum. The polyp was                            sessile. The polyp was removed with a cold biopsy  forceps. Resection and retrieval were complete.                           Three sessile polyps were found in the transverse                            colon. The polyps were 4 to 7 mm in size. These                            polyps were removed with a cold snare. Resection                            and retrieval were complete.                           There was a large lipoma, in the transverse colon.                           Multiple diverticula were found in the sigmoid                            colon and descending colon.                           Non-bleeding internal hemorrhoids were found during                            retroflexion. Complications:            No immediate complications. Estimated Blood Loss:     Estimated blood loss was minimal. Impression:               - The examined portion of the ileum was normal.                           - One 3 mm polyp in the cecum, removed with a cold                             snare. Resected and retrieved.                           - One 2 mm polyp in the cecum, removed with a cold                            biopsy forceps. Resected and retrieved.                           - Three 4 to 7 mm polyps in the transverse colon,                            removed with a cold snare. Resected and retrieved.                           - Large lipoma in the transverse colon.                           -  Diverticulosis in the sigmoid colon and in the                            descending colon.                           - Non-bleeding internal hemorrhoids. Recommendation:           - Discharge patient to home (with escort).                           - Await pathology results.                           - The findings and recommendations were discussed                            with the patient. Dr Pedro Bourgeois "[place]" [place],  06/07/2023 9:30:03 AM

## 2023-06-07 NOTE — Progress Notes (Signed)
 GASTROENTEROLOGY PROCEDURE H&P NOTE   Primary Care Physician: Claire Crick, MD    Reason for Procedure:   History of colon polyps, dysphagia  Plan:    EGD/colonoscopy  Patient is appropriate for endoscopic procedure(s) in the ambulatory ([facility]) setting.  The nature of the procedure, as well as the risks, benefits, and alternatives were carefully and thoroughly reviewed with the patient. Ample time for discussion and questions allowed. The patient understood, was satisfied, and agreed to proceed.     HPI: Elizabeth Curtis is a 73 y.o. female who presents for EGD/colonoscopy for evaluation of history of colon polyps, dysphagia .  Patient was most recently seen in the Gastroenterology Clinic on 10/15/22.  No interval change in medical history since that appointment. Please refer to that note for full details regarding GI history and clinical presentation.   Past Medical History:  Diagnosis Date   Allergy    Arthritis    OA   Blood transfusion without reported diagnosis    with C Section    Cataract 2020   removed both eyes    Chronic low back pain    Diabetes mellitus    Diffuse cystic mastopathy    Disturbance of skin sensation    Diverticulosis of colon (without mention of hemorrhage)    Dysphagia, unspecified(787.20)    Dysplastic nevus of lower extremity, left 03/05/2023   Received pathology from derm (Dr Tresa Frohlich) for L thigh junctional dysplastic nevus with severe atypica, extending to edge 12/2022. S/p re- excision with no residual dysplastic nevus (02/2023).      Essential hypertension, benign 1995   H/O cystitis 2011   History of colonic polyps    Hyperplastic   History of pelvic ultrasound 04/2003   Uterus 13 X 7 X 8cm, Fibroid 2.6cm   HLD (hyperlipidemia)    borderline    Lipoma    Mammographic microcalcification 2013   MDD (major depressive disorder) 11/22/2008   Melanoma (HCC)    upper abdomin left SN removed under axilla   Osteopenia 03/2015   DEXA  hip -1.7, spine -1.7   Osteopenia    Plantar wart    Schatzki's ring 07/23/2008   EGD dilated O/W normal (Dr. 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   Vision abnormalities     Past Surgical History:  Procedure Laterality Date   BREAST BIOPSY Right 07/2011   rec rpt 6 mo, neg   BREAST BIOPSY Right 06/07/2019   us  core  coil clip - fibrocystic change with apocrine metaplasia   CESAREAN SECTION  1982   [place] presentation   COLONOSCOPY  2010   [facility], rec rpt 10 yrs   COLONOSCOPY  02/2020   multiple polyps, hem, diverticulosis, lipoma, rpt 3 yrs ([facility])   ESOPHAGOGASTRODUODENOSCOPY  2010   [facility], dilated schatzki ring   ESOPHAGOGASTRODUODENOSCOPY  01/2021   schatzki ring, dilated, benign biopsies ([facility])   EYE SURGERY Left 2012   cataract   INTRAOCULAR LENS EXCHANGE Left 05/2017   uveitis-glaucoma-hypheme syndrome ([place])   KNEE ARTHROSCOPY W/ MENISCAL REPAIR Left 01/2014   [facility]   MELANOMA EXCISION  2012   upper abdomen   POLYPECTOMY     2008 [facility], 2010 [facility]    Seborrheic keratosis excision  06/02/2023   Left upper cheek- Dr. Tresa Frohlich- [facility]    Prior to Admission medications   Medication Sig Start Date End Date Taking? Authorizing Provider  Alpha-Lipoic Acid 600 MG CAPS  04/20/23  Yes [provider]  amLODipine  (NORVASC ) 5 MG tablet TAKE 1 TABLET BY MOUTH EVERYDAY AT BEDTIME 09/23/22  Yes Claire Crick, MD  Berberine Chloride (BERBERINE HCI) 500 MG CAPS  04/19/23  Yes [provider]  Cholecalciferol (VITAMIN D3) 125 MCG (5000 UT) CAPS Take 1 capsule (5,000 Units total) by mouth every Monday, Wednesday, and Friday. 05/29/22  Yes Claire Crick, MD  Continuous Glucose Sensor (FREESTYLE LIBRE 3 PLUS SENSOR) MISC CHANGE/APPLY SENSOR TO SKIN EVERY 15 DAYS 01/28/23  Yes Claire Crick, MD  cyanocobalamin  (VITAMIN B12) 1000 MCG tablet Take 1 tablet (1,000 mcg total) by mouth every Monday,  Wednesday, and Friday. 05/14/23  Yes Claire Crick, MD  enalapril  (VASOTEC ) 20 MG tablet Take 1 tablet (20 mg total) by mouth daily. 09/23/22  Yes Claire Crick, MD  glipiZIDE  (GLUCOTROL ) 5 MG tablet Take 0.5 tablets (2.5 mg total) by mouth 2 (two) times daily before a meal. 03/26/23  Yes Claire Crick, MD  glucose blood (FREESTYLE LITE) test strip USE TO CHECK SUGAR ONCE DAILY AND AS NEEDED. DX:E11.65 04/18/19  Yes Claire Crick, MD  insulin  glargine (LANTUS  SOLOSTAR) 100 UNIT/ML Solostar Pen Inject 30 Units into the skin daily. 09/23/22  Yes Claire Crick, MD  Insulin  Pen Needle (BD PEN NEEDLE NANO 2ND GEN) 32G X 4 MM MISC USE AS DIRECTED ONCE DAILY 09/23/22  Yes Claire Crick, MD  Magnesium Glycinate (MAGNESIUM GLYCINATE ADVANCED) 120 MG CAPS Take 1 capsule by mouth daily. Mg Glycinate 350mg  with vitamin B6 100mg  05/13/23  Yes Claire Crick, MD  metFORMIN  (GLUCOPHAGE -XR) 500 MG 24 hr tablet Take 1 tablet (500 mg total) by mouth in the morning and at bedtime. 09/23/22  Yes Claire Crick, MD  MISC NATURAL PRODUCTS PO Take by mouth. SNS GlycoPhase   Yes [provider]  Omega-3 Fatty Acids (FISH OIL ) 1000 MG CAPS Take 2 capsules (2,000 mg total) by mouth daily. 05/29/22  Yes Claire Crick, MD  OVER THE COUNTER MEDICATION CBD PM 1000 mg with Melatonin as needed for sleep   Yes [provider]  TURMERIC CURCUMIN PO Take 750 mg by mouth daily.   Yes [provider]  ZADITOR 0.025 % ophthalmic solution Apply to eye. 10/11/19  Yes [provider]  insulin  glargine, 1 Unit Dial, (TOUJEO  SOLOSTAR) 300 UNIT/ML Solostar Pen Inject 30 Units into the skin daily. Patient not taking: Reported on 06/07/2023 05/13/23   Claire Crick, MD    Current Outpatient Medications  Medication Sig Dispense Refill   Alpha-Lipoic Acid 600 MG CAPS      amLODipine  (NORVASC ) 5 MG tablet TAKE 1 TABLET BY MOUTH EVERYDAY AT BEDTIME 90 tablet 4   Berberine Chloride  (BERBERINE HCI) 500 MG CAPS      Cholecalciferol (VITAMIN D3) 125 MCG (5000 UT) CAPS Take 1 capsule (5,000 Units total) by mouth every Monday, Wednesday, and Friday. 30 capsule    Continuous Glucose Sensor (FREESTYLE LIBRE 3 PLUS SENSOR) MISC CHANGE/APPLY SENSOR TO SKIN EVERY 15 DAYS 2 each 6   cyanocobalamin  (VITAMIN B12) 1000 MCG tablet Take 1 tablet (1,000 mcg total) by mouth every Monday, Wednesday, and Friday.     enalapril  (VASOTEC ) 20 MG tablet Take 1 tablet (20 mg total) by mouth daily. 90 tablet 4   glipiZIDE  (GLUCOTROL ) 5 MG tablet Take 0.5 tablets (2.5 mg total) by mouth 2 (two) times daily before a meal. 90 tablet 3   glucose blood (FREESTYLE LITE) test strip USE TO CHECK SUGAR ONCE DAILY AND AS NEEDED. DX:E11.65 100 each 3  insulin  glargine (LANTUS  SOLOSTAR) 100 UNIT/ML Solostar Pen Inject 30 Units into the skin daily. 30 mL 3   Insulin  Pen Needle (BD PEN NEEDLE NANO 2ND GEN) 32G X 4 MM MISC USE AS DIRECTED ONCE DAILY 100 each 4   Magnesium Glycinate (MAGNESIUM GLYCINATE ADVANCED) 120 MG CAPS Take 1 capsule by mouth daily. Mg Glycinate 350mg  with vitamin B6 100mg      metFORMIN  (GLUCOPHAGE -XR) 500 MG 24 hr tablet Take 1 tablet (500 mg total) by mouth in the morning and at bedtime. 180 tablet 4   MISC NATURAL PRODUCTS PO Take by mouth. SNS GlycoPhase     Omega-3 Fatty Acids (FISH OIL ) 1000 MG CAPS Take 2 capsules (2,000 mg total) by mouth daily.  0   OVER THE COUNTER MEDICATION CBD PM 1000 mg with Melatonin as needed for sleep     TURMERIC CURCUMIN PO Take 750 mg by mouth daily.     ZADITOR 0.025 % ophthalmic solution Apply to eye.     insulin  glargine, 1 Unit Dial, (TOUJEO  SOLOSTAR) 300 UNIT/ML Solostar Pen Inject 30 Units into the skin daily. (Patient not taking: Reported on 06/07/2023) 9 mL 1   Current Facility-Administered Medications  Medication Dose Route Frequency Provider Last Rate Last Admin   0.9 %  sodium chloride  infusion  500 mL Intravenous Once Daina Drum, MD         Allergies as of 06/07/2023 - Review Complete 06/07/2023  Allergen Reaction Noted   Avandia [rosiglitazone] Other (See Comments) 09/23/2012   Colesevelam  Nausea Only and Other (See Comments) 10/18/2020   Latex Other (See Comments) 06/07/2023   Other Other (See Comments) 09/22/2012   Statins Other (See Comments) 09/14/2011   Trulicity  [dulaglutide ] Other (See Comments) 10/12/2016    Family History  Problem Relation Age of Onset   Diabetes Mother    Hyperlipidemia Mother    Hypertension Mother    Mental illness Mother        Personality disorder   Kidney disease Father        failure, (Diaylsis)   Diabetes Father    Diabetes Brother    Coronary artery disease Brother 34       CAD, hospitalized freq, PTCA   Gout Brother    Hypertension Brother    Cancer Maternal Aunt 51       breast   Breast cancer Maternal Aunt 65   Cancer Paternal Aunt 75       breast   Breast cancer Paternal Aunt 87   ALS Paternal Aunt    Stroke Paternal Grandfather    Stroke Maternal Grandmother    Breast cancer Maternal Aunt 43   Colon cancer Neg Hx    Colon polyps Neg Hx    Esophageal cancer Neg Hx    Rectal cancer Neg Hx    Stomach cancer Neg Hx     Social History   Socioeconomic History   Marital status: Married    Spouse name: Not on file   Number of children: 1   Years of education: Not on file   Highest education level: Bachelor's degree (e.g., BA, AB, BS)  Occupational History   Occupation: Retired    Associate Professor: RETIRED    Comment: Counsellor  Tobacco Use   Smoking status: Never   Smokeless tobacco: Never  Vaping Use   Vaping status: Never Used  Substance and Sexual Activity   Alcohol use: Yes    Comment: occassionally   Drug use: No   Sexual activity:  Not Currently  Other Topics Concern   Not on file  Social History Narrative   Daily caffeine use- 3 drinks daily   Occupation: retired, 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   Activity: Patient does not get regular exercise, has gym membership   Diet: good water, daily fruits/vegetables   Social Drivers of Health   Financial Resource Strain: Low Risk  (03/23/2023)   Overall Financial Resource Strain (CARDIA)    Difficulty of Paying Living Expenses: Not hard at all  Food Insecurity: No Food Insecurity (03/23/2023)   Hunger Vital Sign    Worried About Running Out of Food in the Last Year: Never true    Ran Out of Food in the Last Year: Never true  Transportation Needs: No Transportation Needs (03/23/2023)   PRAPARE - Administrator, Civil Service (Medical): No    Lack of Transportation (Non-Medical): No  Physical Activity: Insufficiently Active (03/23/2023)   Exercise Vital Sign    Days of Exercise per Week: 2 days    Minutes of Exercise per Session: 30 min  Stress: No Stress Concern Present (03/23/2023)   [employer] of Occupational Health - Occupational Stress Questionnaire    Feeling of Stress : Only a little  Social Connections: Unknown (03/23/2023)   Social Connection and Isolation Panel [NHANES]    Frequency of Communication with Friends and Family: Twice a week    Frequency of Social Gatherings with Friends and Family: Patient declined    Attends Religious Services: Patient declined    Database administrator or Organizations: Yes    Attends Engineer, structural: More than 4 times per year    Marital Status: Married  Catering manager Violence: Not At Risk (09/16/2022)   Humiliation, Afraid, Rape, and Kick questionnaire    Fear of Current or Ex-Partner: No    Emotionally Abused: No    Physically Abused: No    Sexually Abused: No    Physical Exam: Vital signs in last 24 hours: BP (!) 153/91   Pulse 74   Temp 97.9 F (36.6 C) (Temporal)   Ht 5\' 8"  (1.727 m)   Wt 163 lb (73.9 kg)   SpO2 99%   BMI 24.78 kg/m  GEN: NAD EYE: Sclerae anicteric ENT: MMM CV: Non-tachycardic Pulm: No increased WOB GI:  Soft NEURO:  Alert & Oriented   Regino Caprio, MD [facility]   06/07/2023 8:21 AM

## 2023-06-07 NOTE — Patient Instructions (Signed)
 Please read handouts provided. Await pathology results. Use Protonix ( pantoprazole ) 40 mg daily for 8 weeks.    YOU HAD AN ENDOSCOPIC PROCEDURE TODAY AT THE [facility]:   Refer to the procedure report that was given to you for any specific questions about what was found during the examination.  If the procedure report does not answer your questions, please call your gastroenterologist to clarify.  If you requested that your care partner not be given the details of your procedure findings, then the procedure report has been included in a sealed envelope for you to review at your convenience later.  YOU SHOULD EXPECT: Some feelings of bloating in the abdomen. Passage of more gas than usual.  Walking can help get rid of the air that was put into your GI tract during the procedure and reduce the bloating. If you had a lower endoscopy (such as a colonoscopy or flexible sigmoidoscopy) you may notice spotting of blood in your stool or on the toilet paper. If you underwent a bowel prep for your procedure, you may not have a normal bowel movement for a few days.  Please Note:  You might notice some irritation and congestion in your nose or some drainage.  This is from the oxygen used during your procedure.  There is no need for concern and it should clear up in a day or so.  SYMPTOMS TO REPORT IMMEDIATELY:  Following lower endoscopy (colonoscopy or flexible sigmoidoscopy):  Excessive amounts of blood in the stool  Significant tenderness or worsening of abdominal pains  Swelling of the abdomen that is new, acute  Fever of 100F or higher  Following upper endoscopy (EGD)  Vomiting of blood or coffee ground material  New chest pain or pain under the shoulder blades  Painful or persistently difficult swallowing  New shortness of breath  Fever of 100F or higher  Black, tarry-looking stools  For urgent or emergent issues, a gastroenterologist can be reached at any hour by calling [phone]  [phone]. Do not use MyChart messaging for urgent concerns.    DIET:  We do recommend a small meal at first, but then you may proceed to your regular diet.  Drink plenty of fluids but you should avoid alcoholic beverages for 24 hours.  ACTIVITY:  You should plan to take it easy for the rest of today and you should NOT DRIVE or use heavy machinery until tomorrow (because of the sedation medicines used during the test).    FOLLOW UP: Our staff will call the number listed on your records the next business day following your procedure.  We will call around 7:15- 8:00 am to check on you and address any questions or concerns that you may have regarding the information given to you following your procedure. If we do not reach you, we will leave a message.     If any biopsies were taken you will be contacted by phone or by letter within the next 1-3 weeks.  Please call us  at [phone] if you have not heard about the biopsies in 3 weeks.    SIGNATURES/CONFIDENTIALITY: You and/or your care partner have signed paperwork which will be entered into your electronic medical record.  These signatures attest to the fact that that the information above on your After Visit Summary has been reviewed and is understood.  Full responsibility of the confidentiality of this discharge information lies with you and/or your care-partner.

## 2023-06-08 ENCOUNTER — Telehealth: Payer: Self-pay | Admitting: *Deleted

## 2023-06-08 NOTE — Telephone Encounter (Signed)
  Follow up Call-     06/07/2023    7:32 AM 12/13/2020   12:47 PM  Call back number  Post procedure Call Back phone  # [phone]  Permission to leave phone message Yes Yes     Patient questions:  Do you have a fever, pain , or abdominal swelling? No. Pain Score  0 *  Have you tolerated food without any problems? Yes.    Have you been able to return to your normal activities? Yes.    Do you have any questions about your discharge instructions: Diet   No. Medications  No. Follow up visit  No.  Do you have questions or concerns about your Care? No.  Actions: * If pain score is 4 or above: No action needed, pain <4.

## 2023-06-09 ENCOUNTER — Ambulatory Visit
Admission: RE | Admit: 2023-06-09 | Discharge: 2023-06-09 | Disposition: A | Discharge status: Home / Self Care | Source: Ambulatory Visit | Attending: Family Medicine | Admitting: Family Medicine

## 2023-06-09 ENCOUNTER — Encounter: Payer: Self-pay | Admitting: Internal Medicine

## 2023-06-09 DIAGNOSIS — Z1231 Encounter for screening mammogram for malignant neoplasm of breast: Secondary | ICD-10-CM | POA: Insufficient documentation

## 2023-06-09 LAB — SURGICAL PATHOLOGY

## 2023-06-14 ENCOUNTER — Encounter: Payer: Self-pay | Admitting: Family Medicine

## 2023-09-02 ENCOUNTER — Other Ambulatory Visit: Payer: Self-pay | Admitting: Internal Medicine

## 2023-09-04 ENCOUNTER — Encounter: Payer: Self-pay | Admitting: Internal Medicine

## 2023-09-13 ENCOUNTER — Ambulatory Visit (INDEPENDENT_AMBULATORY_CARE_PROVIDER_SITE_OTHER)

## 2023-09-13 ENCOUNTER — Telehealth: Payer: Self-pay | Admitting: Family Medicine

## 2023-09-13 VITALS — BP 130/80 | Ht 68.0 in | Wt 160.0 lb

## 2023-09-13 DIAGNOSIS — Z0000 Encounter for general adult medical examination without abnormal findings: Secondary | ICD-10-CM | POA: Diagnosis not present

## 2023-09-13 DIAGNOSIS — Z2821 Immunization not carried out because of patient refusal: Secondary | ICD-10-CM

## 2023-09-13 NOTE — Telephone Encounter (Signed)
 Copied from CRM [id number]. Topic: Appointments - Appointment Info/Confirmation >> Sep 13, 2023  8:34 AM Elizabeth Curtis wrote: Patient called to confirm her AWV being changed from 8/13 to today 8/4 but her last one was done 09/16/22 - is 09/13/23 okay for her next AWV?

## 2023-09-13 NOTE — Progress Notes (Signed)
 Because this visit was a virtual/telehealth visit,  certain criteria was not obtained, such a blood pressure, CBG if applicable, and timed get up and go. Any medications not marked as taking were not mentioned during the medication reconciliation part of the visit. Any vitals not documented were not able to be obtained due to this being a telehealth visit or patient was unable to self-report a recent blood pressure reading due to a lack of equipment at home via telehealth. Vitals that have been documented are verbally provided by the patient.  This visit was performed by a medical professional under my direct supervision. I was immediately available for consultation/collaboration. I have reviewed and agree with the Annual Wellness Visit documentation.  Subjective:   Elizabeth Curtis is a 73 y.o. who presents for a Medicare Wellness preventive visit.  As a reminder, Annual Wellness Visits don't include a physical exam, and some assessments may be limited, especially if this visit is performed virtually. We may recommend an in-person follow-up visit with your provider if needed.  Visit Complete: Virtual I connected with  Elizabeth Curtis on 09/13/23 by a audio enabled telemedicine application and verified that I am speaking with the correct person using two identifiers.  Patient Location: Home  Provider Location: Home Office  I discussed the limitations of evaluation and management by telemedicine. The patient expressed understanding and agreed to proceed.  Vital Signs: Because this visit was a virtual/telehealth visit, some criteria may be missing or patient reported. Any vitals not documented were not able to be obtained and vitals that have been documented are patient reported.  VideoDeclined- This patient declined Librarian, academic. Therefore the visit was completed with audio only.  Persons Participating in Visit: Patient.  AWV Questionnaire: No: Patient  Medicare AWV questionnaire was not completed prior to this visit.  Cardiac Risk Factors include: advanced age (>76men, >69 women);hypertension;dyslipidemia;diabetes mellitus     Objective:    Today's Vitals   09/13/23 1358  BP: 130/80  Weight: 160 lb (72.6 kg)  Height: 5' 8 (1.727 m)   Body mass index is 24.33 kg/m.     09/13/2023    1:58 PM 09/16/2022    8:46 AM 09/04/2021   12:30 PM 07/15/2018   11:22 AM 04/29/2016   10:51 AM  Advanced Directives  Does Patient Have a Medical Advance Directive? Yes Yes Yes Yes Yes   Type of Estate agent of [place];Living will Healthcare Power of [place];Living will Healthcare Power of [place];Living will Healthcare Power of [place];Living will Healthcare Power of [place];Living will  Does patient want to make changes to medical advance directive? No - Patient declined      Copy of Healthcare Power of Attorney in Chart? Yes - validated most recent copy scanned in chart (See row information) Yes - validated most recent copy scanned in chart (See row information) Yes - validated most recent copy scanned in chart (See row information) Yes - validated most recent copy scanned in chart (See row information)  No - copy requested      Data saved with a previous flowsheet row definition    Current Medications (verified) Outpatient Encounter Medications as of 09/13/2023  Medication Sig   Alpha-Lipoic Acid 600 MG CAPS    amLODipine  (NORVASC ) 5 MG tablet TAKE 1 TABLET BY MOUTH EVERYDAY AT BEDTIME   Berberine Chloride (BERBERINE HCI) 500 MG CAPS    Cholecalciferol (VITAMIN D3) 125 MCG (5000 UT) CAPS Take 1 capsule (5,000 Units total) by  mouth every Monday, Wednesday, and Friday.   Continuous Glucose Sensor (FREESTYLE LIBRE 3 PLUS SENSOR) MISC CHANGE/APPLY SENSOR TO SKIN EVERY 15 DAYS   cyanocobalamin  (VITAMIN B12) 1000 MCG tablet Take 1 tablet (1,000 mcg total) by mouth every Monday, Wednesday, and Friday.   enalapril  (VASOTEC ) 20 MG  tablet Take 1 tablet (20 mg total) by mouth daily.   glipiZIDE  (GLUCOTROL ) 5 MG tablet Take 0.5 tablets (2.5 mg total) by mouth 2 (two) times daily before a meal.   glucose blood (FREESTYLE LITE) test strip USE TO CHECK SUGAR ONCE DAILY AND AS NEEDED. DX:E11.65   insulin  glargine (LANTUS  SOLOSTAR) 100 UNIT/ML Solostar Pen Inject 30 Units into the skin daily.   Insulin  Pen Needle (BD PEN NEEDLE NANO 2ND GEN) 32G X 4 MM MISC USE AS DIRECTED ONCE DAILY   Magnesium  Glycinate (MAGNESIUM  GLYCINATE ADVANCED) 120 MG CAPS Take 1 capsule by mouth daily. Mg Glycinate 350mg  with vitamin B6 100mg    metFORMIN  (GLUCOPHAGE -XR) 500 MG 24 hr tablet Take 1 tablet (500 mg total) by mouth in the morning and at bedtime.   Omega-3 Fatty Acids (FISH OIL ) 1000 MG CAPS Take 2 capsules (2,000 mg total) by mouth daily.   OVER THE COUNTER MEDICATION CBD PM 1000 mg with Melatonin as needed for sleep   pantoprazole  (PROTONIX ) 40 MG tablet TAKE 1 TABLET (40 MG TOTAL) BY MOUTH DAILY. PROTONIX  40 MG DAILY FOR 8 WEEKS.   TURMERIC CURCUMIN PO Take 750 mg by mouth daily.   ZADITOR 0.025 % ophthalmic solution Apply to eye.   insulin  glargine, 1 Unit Dial, (TOUJEO  SOLOSTAR) 300 UNIT/ML Solostar Pen Inject 30 Units into the skin daily. (Patient not taking: Reported on 09/13/2023)   MISC NATURAL PRODUCTS PO Take by mouth. SNS GlycoPhase   No facility-administered encounter medications on file as of 09/13/2023.    Allergies (verified) Avandia [rosiglitazone], Colesevelam , Latex, Other, Statins, and Trulicity  [dulaglutide ]   History: Past Medical History:  Diagnosis Date   Allergy    Arthritis    OA   Blood transfusion without reported diagnosis    with C Section    Cataract 2020   removed both eyes    Chronic low back pain    Diabetes mellitus    Diffuse cystic mastopathy    Disturbance of skin sensation    Diverticulosis of colon (without mention of hemorrhage)    Dysphagia, unspecified(787.20)    Dysplastic nevus of lower  extremity, left 03/05/2023   Received pathology from derm (Dr Dela) for L thigh junctional dysplastic nevus with severe atypica, extending to edge 12/2022. S/p re- excision with no residual dysplastic nevus (02/2023).      Essential hypertension, benign 1995   H/O cystitis 2011   History of colonic polyps    Hyperplastic   History of pelvic ultrasound 04/2003   Uterus 13 X 7 X 8cm, Fibroid 2.6cm   HLD (hyperlipidemia)    borderline    Lipoma    Mammographic microcalcification 2013   MDD (major depressive disorder) 11/22/2008   Melanoma (HCC)    upper abdomin left SN removed under axilla   Osteopenia 03/2015   DEXA hip -1.7, spine -1.7   Osteopenia    Plantar wart    Schatzki's ring 07/23/2008   EGD dilated O/W normal (Dr. 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   Vision abnormalities    Past Surgical History:  Procedure Laterality Date   BREAST BIOPSY Right 07/2011   rec rpt 6  mo, neg   BREAST BIOPSY Right 06/07/2019   us  core  coil clip - fibrocystic change with apocrine metaplasia   CESAREAN SECTION  1982   [facility] presentation   COLONOSCOPY  2010   [facility], rec rpt 10 yrs   COLONOSCOPY  02/2020   multiple polyps, hem, diverticulosis, lipoma, rpt 3 yrs ([facility])   ESOPHAGOGASTRODUODENOSCOPY  2010   [facility], dilated schatzki ring   ESOPHAGOGASTRODUODENOSCOPY  01/2021   schatzki ring, dilated, benign biopsies ([facility])   EYE SURGERY Left 2012   cataract   INTRAOCULAR LENS EXCHANGE Left 05/2017   uveitis-glaucoma-hypheme syndrome ([facility])   KNEE ARTHROSCOPY W/ MENISCAL REPAIR Left 01/2014   [facility]   MELANOMA EXCISION  2012   upper abdomen   POLYPECTOMY     2008 [facility], 2010 [facility]    Seborrheic keratosis excision  06/02/2023   Left upper cheek- Dr. Dela- [facility]   Family History  Problem Relation Age of Onset   Diabetes Mother    Hyperlipidemia Mother    Hypertension Mother    Mental illness Mother         Personality disorder   Kidney disease Father        failure, (Diaylsis)   Diabetes Father    Diabetes Brother    Coronary artery disease Brother 71       CAD, hospitalized freq, PTCA   Gout Brother    Hypertension Brother    Cancer Maternal Aunt 26       breast   Breast cancer Maternal Aunt 64   Cancer Paternal Aunt 58       breast   Breast cancer Paternal Aunt 74   ALS Paternal Aunt    Stroke Paternal Grandfather    Stroke Maternal Grandmother    Breast cancer Maternal Aunt 68   Colon cancer Neg Hx    Colon polyps Neg Hx    Esophageal cancer Neg Hx    Rectal cancer Neg Hx    Stomach cancer Neg Hx    Social History   Socioeconomic History   Marital status: Married    Spouse name: Not on file   Number of children: 1   Years of education: Not on file   Highest education level: Bachelor's degree (e.g., BA, AB, BS)  Occupational History   Occupation: Retired    Associate Professor: RETIRED    Comment: Counsellor  Tobacco Use   Smoking status: Never   Smokeless tobacco: Never  Vaping Use   Vaping status: Never Used  Substance and Sexual Activity   Alcohol use: Yes    Comment: occassionally   Drug use: No   Sexual activity: Not Currently  Other Topics Concern   Not on file  Social History Narrative   Daily caffeine use- 3 drinks daily   Occupation: retired, was Nurse, 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   Activity: Patient does not get regular exercise, has gym membership   Diet: good water, daily fruits/vegetables   Social Drivers of Health   Financial Resource Strain: Low Risk  (09/13/2023)   Overall Financial Resource Strain (CARDIA)    Difficulty of Paying Living Expenses: Not hard at all  Food Insecurity: No Food Insecurity (09/13/2023)   Hunger Vital Sign    Worried About Running Out of Food in the Last Year: Never true    Ran Out of Food in the Last Year: Never true  Transportation Needs: No Transportation Needs (09/13/2023)    PRAPARE -  Administrator, Civil Service (Medical): No    Lack of Transportation (Non-Medical): No  Physical Activity: Insufficiently Active (09/13/2023)   Exercise Vital Sign    Days of Exercise per Week: 3 days    Minutes of Exercise per Session: 10 min  Stress: No Stress Concern Present (09/13/2023)   [employer] of Occupational Health - Occupational Stress Questionnaire    Feeling of Stress: Only a little  Social Connections: Unknown (09/13/2023)   Social Connection and Isolation Panel    Frequency of Communication with Friends and Family: Patient declined    Frequency of Social Gatherings with Friends and Family: Patient declined    Attends Religious Services: Patient declined    Database administrator or Organizations: Patient declined    Attends Engineer, structural: Patient declined    Marital Status: Married    Tobacco Counseling Counseling given: Not Answered    Clinical Intake:  Pre-visit preparation completed: Yes  Pain : No/denies pain     BMI - recorded: 24.33 Nutritional Status: BMI of 19-24  Normal Nutritional Risks: None Diabetes: Yes CBG done?: No Did pt. bring in CBG monitor from home?: No  Lab Results  Component Value Date   HGBA1C 7.1 (A) 03/26/2023   HGBA1C 8.2 (H) 09/16/2022   HGBA1C 7.5 (A) 05/29/2022     How often do you need to have someone help you when you read instructions, pamphlets, or other written materials from your doctor or pharmacy?: 1 - Never  Interpreter Needed?: No      Activities of Daily Living     09/13/2023    8:40 AM 09/15/2022    1:14 PM  In your present state of health, do you have any difficulty performing the following activities:  Hearing? 0 0  Vision? 0 0  Difficulty concentrating or making decisions? 0 0  Walking or climbing stairs? 0 0  Dressing or bathing? 0 0  Doing errands, shopping? 0 0  Preparing Food and eating ? N N  Using the Toilet? N N  In the past six months, have you  accidently leaked urine? N Y  Comment  when coughs hard  Do you have problems with loss of bowel control? N N  Managing your Medications? N N  Managing your Finances? N N  Housekeeping or managing your Housekeeping? N N    Patient Care Team: Rilla Baller, MD as PCP - General (Family Medicine) Dellie Louanne MATSU, MD (General Surgery) Myra Rosaline FALCON, [facility] (Inactive) as Pharmacist (Pharmacist) Eda Iha, MD (Inactive) as Consulting Physician (Gastroenterology)  I have updated your Care Teams any recent Medical Services you may have received from other providers in the past year.     Assessment:   This is a routine wellness examination for Elizabeth Curtis.  Hearing/Vision screen Hearing Screening - Comments:: Patient has hearing aid in left ear  Vision Screening - Comments:: Patient wears glasses    Goals Addressed             This Visit's Progress    Patient Stated       Patient would like to have remodeled kitchen done       Depression Screen     09/13/2023    2:02 PM 03/26/2023    7:35 AM 09/16/2022    8:47 AM 05/29/2022    9:31 AM 09/04/2021   12:31 PM 09/03/2020   11:01 AM 08/04/2019   10:44 AM  PHQ 2/9 Scores  PHQ - 2 Score  0 0 0 0 0 0 0  PHQ- 9 Score 1 1 0 1       Fall Risk     09/13/2023    8:40 AM 03/26/2023    7:35 AM 09/15/2022    1:14 PM 05/29/2022    9:31 AM 09/04/2021   12:30 PM  Fall Risk   Falls in the past year? 0 0 0 0 1  Comment     lost balance in the dark  Number falls in past yr: 0  0  0  Injury with Fall? 0  0  0  Risk for fall due to : No Fall Risks  Medication side effect  Medication side effect  Follow up Falls evaluation completed  Falls prevention discussed;Falls evaluation completed  Falls evaluation completed;Education provided;Falls prevention discussed      Data saved with a previous flowsheet row definition    MEDICARE RISK AT HOME:  Medicare Risk at Home Any stairs in or around the home?: (Patient-Rptd) Yes If so, are  there any without handrails?: (Patient-Rptd) No Home free of loose throw rugs in walkways, pet beds, electrical cords, etc?: (Patient-Rptd) Yes Adequate lighting in your home to reduce risk of falls?: (Patient-Rptd) Yes Life alert?: (Patient-Rptd) No Use of a cane, walker or w/c?: (Patient-Rptd) No Grab bars in the bathroom?: (Patient-Rptd) No Shower chair or bench in shower?: (Patient-Rptd) No Elevated toilet seat or a handicapped toilet?: (Patient-Rptd) No  TIMED UP AND GO:  Was the test performed?  No  Cognitive Function: 6CIT completed    07/15/2018   11:26 AM 04/29/2016   10:52 AM  MMSE - Mini Mental State Exam  Orientation to time 5 5   Orientation to Place 5 5   Registration 3 3   Attention/ Calculation 0 0   Recall 3 3   Language- name 2 objects 0 0   Language- repeat 1 1  Language- follow 3 step command 0 3   Language- read & follow direction 0 0   Write a sentence 0 0   Copy design 0 0   Total score 17 20      Data saved with a previous flowsheet row definition        09/13/2023    2:03 PM 09/16/2022    8:47 AM 09/04/2021   12:32 PM  6CIT Screen  What Year? 0 points 0 points 0 points  What month? 0 points 0 points 0 points  What time? 0 points 0 points 0 points  Count back from 20 0 points 0 points 0 points  Months in reverse 0 points 0 points 0 points  Repeat phrase 0 points 0 points 0 points  Total Score 0 points 0 points 0 points    Immunizations Immunization History  Administered Date(s) Administered    sv, Bivalent, Protein Subunit Rsvpref,pf (Abrysvo) 11/03/2022   Fluad Quad(high Dose 65+) 11/24/2018, 11/19/2021, 12/03/2022   Influenza Split 10/23/2010, 12/16/2011   Influenza Whole 11/23/2007, 10/31/2008, 12/28/2009   Influenza, High Dose Seasonal PF 12/24/2016, 12/02/2017, 12/06/2019, 11/22/2020, 12/03/2022   Influenza,inj,Quad PF,6+ Mos 12/26/2012, 12/29/2013, 12/27/2014, 11/15/2015   PFIZER Comirnaty(Gray Top)Covid-19 Tri-Sucrose Vaccine  09/08/2020, 11/20/2021   PFIZER(Purple Top)SARS-COV-2 Vaccination 03/16/2019, 04/06/2019, 11/23/2019   Pfizer Covid-19 Vaccine Bivalent Booster 40yrs & up 12/16/2020   Pfizer(Comirnaty)Fall Seasonal Vaccine 12 years and older 11/19/2021, 12/03/2022   Pneumococcal Conjugate-13 03/07/2015   Pneumococcal Polysaccharide-23 09/22/2011   Td 02/28/1998, 05/09/2008    Screening Tests Health Maintenance  Topic Date Due  Zoster Vaccines- Shingrix (1 of 2) Never done   Diabetic kidney evaluation - Urine ACR  11/30/2010   DTaP/Tdap/Td (3 - Tdap) 05/10/2018   Pneumococcal Vaccine: 50+ Years (3 of 3 - PCV20 or PCV21) 03/06/2020   COVID-19 Vaccine (8 - Pfizer risk 2024-25 season) 06/03/2023   INFLUENZA VACCINE  09/10/2023   Diabetic kidney evaluation - eGFR measurement  09/16/2023   HEMOGLOBIN A1C  09/23/2023   OPHTHALMOLOGY EXAM  01/21/2024   FOOT EXAM  03/25/2024   MAMMOGRAM  06/08/2024   Medicare Annual Wellness (AWV)  09/12/2024   Colonoscopy  06/07/2026   DEXA SCAN  Completed   Hepatitis C Screening  Completed   Hepatitis B Vaccines  Aged Out   HPV VACCINES  Aged Out   Meningococcal B Vaccine  Aged Out    Health Maintenance  Health Maintenance Due  Topic Date Due   Zoster Vaccines- Shingrix (1 of 2) Never done   Diabetic kidney evaluation - Urine ACR  11/30/2010   DTaP/Tdap/Td (3 - Tdap) 05/10/2018   Pneumococcal Vaccine: 50+ Years (3 of 3 - PCV20 or PCV21) 03/06/2020   COVID-19 Vaccine (8 - Pfizer risk 2024-25 season) 06/03/2023   INFLUENZA VACCINE  09/10/2023   Diabetic kidney evaluation - eGFR measurement  09/16/2023   Health Maintenance Items Addressed:patient declined vaccinations  Additional Screening:  Vision Screening: Recommended annual ophthalmology exams for early detection of glaucoma and other disorders of the eye. Would you like a referral to an eye doctor? No    Dental Screening: Recommended annual dental exams for proper oral hygiene  Community Resource  Referral / Chronic Care Management: CRR required this visit?  No   CCM required this visit?  No   Plan:    I have personally reviewed and noted the following in the patient's chart:   Medical and social history Use of alcohol, tobacco or illicit drugs  Current medications and supplements including opioid prescriptions. Patient is not currently taking opioid prescriptions. Functional ability and status Nutritional status Physical activity Advanced directives List of other physicians Hospitalizations, surgeries, and ER visits in previous 12 months Vitals Screenings to include cognitive, depression, and falls Referrals and appointments  In addition, I have reviewed and discussed with patient certain preventive protocols, quality metrics, and best practice recommendations. A written personalized care plan for preventive services as well as general preventive health recommendations were provided to patient.   Lyle MARLA Right, [place]   09/13/2023   After Visit Summary: (MyChart) Due to this being a telephonic visit, the after visit summary with patients personalized plan was offered to patient via MyChart   Notes: Nothing significant to report at this time.

## 2023-09-13 NOTE — Patient Instructions (Signed)
 Elizabeth Curtis , Thank you for taking time out of your busy schedule to complete your Annual Wellness Visit with me. I enjoyed our conversation and look forward to speaking with you again next year. I, as well as your care team,  appreciate your ongoing commitment to your health goals. Please review the following plan we discussed and let me know if I can assist you in the future. Your Game plan/ To Do List    Referrals: If you haven't heard from the office you've been referred to, please reach out to them at the phone provided.   Follow up Visits: We will see or speak with you next year for your Next Medicare AWV with our clinical staff Have you seen your provider in the last 6 months (3 months if uncontrolled diabetes)? Yes  Clinician Recommendations:  Aim for 30 minutes of exercise or brisk walking, 6-8 glasses of water, and 5 servings of fruits and vegetables each day.       This is a list of the screenings recommended for you:  Health Maintenance  Topic Date Due   Zoster (Shingles) Vaccine (1 of 2) Never done   Yearly kidney health urinalysis for diabetes  11/30/2010   DTaP/Tdap/Td vaccine (3 - Tdap) 05/10/2018   Pneumococcal Vaccine for age over 22 (3 of 3 - PCV20 or PCV21) 03/06/2020   COVID-19 Vaccine (8 - Pfizer risk 2024-25 season) 06/03/2023   Flu Shot  09/10/2023   Yearly kidney function blood test for diabetes  09/16/2023   Medicare Annual Wellness Visit  09/16/2023   Hemoglobin A1C  09/23/2023   Eye exam for diabetics  01/21/2024   Complete foot exam   03/25/2024   Mammogram  06/08/2024   Colon Cancer Screening  06/07/2026   DEXA scan (bone density measurement)  Completed   Hepatitis C Screening  Completed   Hepatitis B Vaccine  Aged Out   HPV Vaccine  Aged Out   Meningitis B Vaccine  Aged Out    Advanced directives: (Declined) Advance directive discussed with you today. Even though you declined this today, please call our office should you change your mind, and we can  give you the proper paperwork for you to fill out. Advance Care Planning is important because it:  [x]  Makes sure you receive the medical care that is consistent with your values, goals, and preferences  [x]  It provides guidance to your family and loved ones and reduces their decisional burden about whether or not they are making the right decisions based on your wishes.  Follow the link provided in your after visit summary or read over the paperwork we have mailed to you to help you started getting your Advance Directives in place. If you need assistance in completing these, please reach out to us  so that we can help you!  See attachments for Preventive Care and Fall Prevention Tips.

## 2023-09-17 ENCOUNTER — Encounter: Payer: Self-pay | Admitting: Family Medicine

## 2023-09-17 NOTE — Telephone Encounter (Signed)
 I have sent message to [facility] to see if they are sending out.

## 2023-09-19 ENCOUNTER — Other Ambulatory Visit: Payer: Self-pay | Admitting: Family Medicine

## 2023-09-19 ENCOUNTER — Encounter: Payer: Self-pay | Admitting: Family Medicine

## 2023-09-19 DIAGNOSIS — E1169 Type 2 diabetes mellitus with other specified complication: Secondary | ICD-10-CM

## 2023-09-19 DIAGNOSIS — Z85820 Personal history of malignant melanoma of skin: Secondary | ICD-10-CM

## 2023-09-19 DIAGNOSIS — R946 Abnormal results of thyroid function studies: Secondary | ICD-10-CM

## 2023-09-19 DIAGNOSIS — M8589 Other specified disorders of bone density and structure, multiple sites: Secondary | ICD-10-CM

## 2023-09-20 ENCOUNTER — Ambulatory Visit: Payer: Self-pay | Admitting: Family Medicine

## 2023-09-20 ENCOUNTER — Other Ambulatory Visit (INDEPENDENT_AMBULATORY_CARE_PROVIDER_SITE_OTHER): Payer: PPO

## 2023-09-20 DIAGNOSIS — M8589 Other specified disorders of bone density and structure, multiple sites: Secondary | ICD-10-CM | POA: Diagnosis not present

## 2023-09-20 DIAGNOSIS — R946 Abnormal results of thyroid function studies: Secondary | ICD-10-CM

## 2023-09-20 DIAGNOSIS — Z85820 Personal history of malignant melanoma of skin: Secondary | ICD-10-CM | POA: Diagnosis not present

## 2023-09-20 DIAGNOSIS — E1169 Type 2 diabetes mellitus with other specified complication: Secondary | ICD-10-CM | POA: Diagnosis not present

## 2023-09-20 DIAGNOSIS — Z794 Long term (current) use of insulin: Secondary | ICD-10-CM | POA: Diagnosis not present

## 2023-09-20 DIAGNOSIS — E785 Hyperlipidemia, unspecified: Secondary | ICD-10-CM

## 2023-09-20 LAB — CBC WITH DIFFERENTIAL/PLATELET
Basophils Absolute: 0.1 K/uL (ref 0.0–0.1)
Basophils Relative: 1.2 % (ref 0.0–3.0)
Eosinophils Absolute: 0.2 K/uL (ref 0.0–0.7)
Eosinophils Relative: 3.4 % (ref 0.0–5.0)
HCT: 40.5 % (ref 36.0–46.0)
Hemoglobin: 13.5 g/dL (ref 12.0–15.0)
Lymphocytes Relative: 26.5 % (ref 12.0–46.0)
Lymphs Abs: 1.6 K/uL (ref 0.7–4.0)
MCHC: 33.2 g/dL (ref 30.0–36.0)
MCV: 87.1 fl (ref 78.0–100.0)
Monocytes Absolute: 0.5 K/uL (ref 0.1–1.0)
Monocytes Relative: 8.4 % (ref 3.0–12.0)
Neutro Abs: 3.7 K/uL (ref 1.4–7.7)
Neutrophils Relative %: 60.5 % (ref 43.0–77.0)
Platelets: 174 K/uL (ref 150.0–400.0)
RBC: 4.65 Mil/uL (ref 3.87–5.11)
RDW: 14 % (ref 11.5–15.5)
WBC: 6.1 K/uL (ref 4.0–10.5)

## 2023-09-20 LAB — LIPID PANEL
Cholesterol: 178 mg/dL (ref 0–200)
HDL: 48.6 mg/dL (ref >39.00)
LDL Cholesterol: 111 mg/dL — ABNORMAL HIGH (ref 0–99)
NonHDL: 129.59
Total CHOL/HDL Ratio: 4
Triglycerides: 91 mg/dL (ref 0.0–149.0)
VLDL: 18.2 mg/dL (ref 0.0–40.0)

## 2023-09-20 LAB — COMPREHENSIVE METABOLIC PANEL WITH GFR
ALT: 12 U/L (ref 0–35)
AST: 13 U/L (ref 0–37)
Albumin: 4.3 g/dL (ref 3.5–5.2)
Alkaline Phosphatase: 56 U/L (ref 39–117)
BUN: 15 mg/dL (ref 6–23)
CO2: 29 meq/L (ref 19–32)
Calcium: 9 mg/dL (ref 8.4–10.5)
Chloride: 103 meq/L (ref 96–112)
Creatinine, Ser: 0.59 mg/dL (ref 0.40–1.20)
GFR: 89.24 mL/min (ref >60.00)
Glucose, Bld: 98 mg/dL (ref 70–99)
Potassium: 4.1 meq/L (ref 3.5–5.1)
Sodium: 140 meq/L (ref 135–145)
Total Bilirubin: 0.6 mg/dL (ref 0.2–1.2)
Total Protein: 6.2 g/dL (ref 6.0–8.3)

## 2023-09-20 LAB — MICROALBUMIN / CREATININE URINE RATIO
Creatinine,U: 20.1 mg/dL
Microalb Creat Ratio: UNDETERMINED mg/g (ref 0.0–30.0)
Microalb, Ur: <0.7 mg/dL

## 2023-09-20 LAB — VITAMIN D 25 HYDROXY (VIT D DEFICIENCY, FRACTURES): VITD: 61.67 ng/mL (ref 30.00–100.00)

## 2023-09-20 LAB — VITAMIN B12: Vitamin B-12: 494 pg/mL (ref 211–911)

## 2023-09-20 LAB — HEMOGLOBIN A1C: Hgb A1c MFr Bld: 7.3 % — ABNORMAL HIGH (ref 4.6–6.5)

## 2023-09-20 LAB — T4, FREE: Free T4: 0.99 ng/dL (ref 0.60–1.60)

## 2023-09-20 LAB — TSH: TSH: 0.67 u[IU]/mL (ref 0.35–5.50)

## 2023-09-27 ENCOUNTER — Ambulatory Visit (INDEPENDENT_AMBULATORY_CARE_PROVIDER_SITE_OTHER): Payer: PPO | Admitting: Family Medicine

## 2023-09-27 ENCOUNTER — Encounter: Payer: Self-pay | Admitting: Family Medicine

## 2023-09-27 VITALS — BP 134/84 | HR 91 | Temp 98.7°F | Ht 68.5 in | Wt 162.4 lb

## 2023-09-27 DIAGNOSIS — R946 Abnormal results of thyroid function studies: Secondary | ICD-10-CM

## 2023-09-27 DIAGNOSIS — E1169 Type 2 diabetes mellitus with other specified complication: Secondary | ICD-10-CM

## 2023-09-27 DIAGNOSIS — E785 Hyperlipidemia, unspecified: Secondary | ICD-10-CM | POA: Diagnosis not present

## 2023-09-27 DIAGNOSIS — E1140 Type 2 diabetes mellitus with diabetic neuropathy, unspecified: Secondary | ICD-10-CM

## 2023-09-27 DIAGNOSIS — Z0000 Encounter for general adult medical examination without abnormal findings: Secondary | ICD-10-CM | POA: Diagnosis not present

## 2023-09-27 DIAGNOSIS — M8589 Other specified disorders of bone density and structure, multiple sites: Secondary | ICD-10-CM

## 2023-09-27 DIAGNOSIS — Z794 Long term (current) use of insulin: Secondary | ICD-10-CM

## 2023-09-27 DIAGNOSIS — K862 Cyst of pancreas: Secondary | ICD-10-CM

## 2023-09-27 DIAGNOSIS — Z7189 Other specified counseling: Secondary | ICD-10-CM

## 2023-09-27 DIAGNOSIS — N393 Stress incontinence (female) (male): Secondary | ICD-10-CM

## 2023-09-27 DIAGNOSIS — Z23 Encounter for immunization: Secondary | ICD-10-CM

## 2023-09-27 DIAGNOSIS — Z85820 Personal history of malignant melanoma of skin: Secondary | ICD-10-CM

## 2023-09-27 DIAGNOSIS — I10 Essential (primary) hypertension: Secondary | ICD-10-CM

## 2023-09-27 DIAGNOSIS — T466X5A Adverse effect of antihyperlipidemic and antiarteriosclerotic drugs, initial encounter: Secondary | ICD-10-CM

## 2023-09-27 MED ORDER — METFORMIN HCL ER 500 MG PO TB24: 500.0000 mg | ORAL_TABLET | Freq: Two times a day (BID) | ORAL | 4 refills | Status: AC

## 2023-09-27 MED ORDER — ENALAPRIL MALEATE 20 MG PO TABS: 20.0000 mg | ORAL_TABLET | Freq: Every day | ORAL | 4 refills | Status: AC

## 2023-09-27 MED ORDER — AMLODIPINE BESYLATE 5 MG PO TABS: ORAL_TABLET | ORAL | 4 refills | Status: AC

## 2023-09-27 MED ORDER — MUPIROCIN CALCIUM 2 % EX CREA: 1.0000 | TOPICAL_CREAM | Freq: Two times a day (BID) | CUTANEOUS | 0 refills | Status: AC

## 2023-09-27 MED ORDER — BD PEN NEEDLE NANO 2ND GEN 32G X 4 MM MISC: 4 refills | Status: AC

## 2023-09-27 MED ORDER — TOUJEO SOLOSTAR 300 UNIT/ML ~~LOC~~ SOPN: 35.0000 [IU] | PEN_INJECTOR | Freq: Every day | SUBCUTANEOUS | 3 refills | Status: DC

## 2023-09-27 MED ORDER — GLIPIZIDE 5 MG PO TABS: 2.5000 mg | ORAL_TABLET | Freq: Every day | ORAL | 1 refills | Status: AC

## 2023-09-27 NOTE — Progress Notes (Signed)
 Ph: [phone] Fax: [phone]   Patient ID: Elizabeth Curtis, female    DOB: Jun 28, 1950, 73 y.o.   MRN: [id number]  This visit was conducted in person.  BP 134/84 Comment: at home this morning  Pulse 91   Temp 98.7 F (37.1 C) (Oral)   Ht 5' 8.5 (1.74 m)   Wt 162 lb 6 oz (73.7 kg)   SpO2 97%   BMI 24.33 kg/m   BP Readings from Last 3 Encounters:  09/27/23 134/84  09/13/23 130/80  06/07/23 (!) 191/77  164/104 on repeat At home this morning 134/84.   CC: CPE Subjective:   HPI: Elizabeth Curtis is a 73 y.o. female presenting on 09/27/2023 for Annual Exam   Saw health advisor 09/13/2023 for medicare wellness visit. Note reviewed.   No results found.  Flowsheet Row Office Visit from 09/27/2023 in [facility] [place]  PHQ-2 Total Score 0       09/27/2023    9:27 AM 09/13/2023    8:40 AM 03/26/2023    7:35 AM 09/15/2022    1:14 PM 05/29/2022    9:31 AM  Fall Risk   Falls in the past year? 0 0 0 0 0  Number falls in past yr: 0 0  0   Injury with Fall? 0 0  0   Risk for fall due to : No Fall Risks No Fall Risks  Medication side effect   Follow up Falls evaluation completed Falls evaluation completed  Falls prevention discussed;Falls evaluation completed    DM - continues glipizide  2.5mg  BID with meals, lantus  toujeo  35u at 10pm, metformin  XR 500mg  BID. Did not tolerate Trulicity  trial, h/o recurrent UTIs so wants to avoid SGLT2i. Brings sugar log - overall improved since starting Toujeo .  Diet deteriorated.  Freestyle Libre 3 CGM: 139 30d average, 89% target range, 11% high.   Abnormal pancreas MRI 08/2022 followed by GI - multiple cystic lesions throughout pancreas - side branch IPMNs vs pseudocysts, no change from prior imaging, rec f/u GI 2 yrs. Also with gallstones.   HTN - continues amlodipine  5mg  daily, enalapril  20mg  daily  Requests mupirocin  Rx refilled   Preventative: COLONOSCOPY WITH ESOPHAGOGASTRODUODENOSCOPY (EGD) 05/2023 -  EGD: benign stricture dilated, gastritis  neg H pylori, peptic injury, duodenal polyps; colon: TA,SSP, diverticulosis, large lipoma, rpt colon 3 yrs [facility])  Well woman Dr Fredirick 05/2021. Latest pap normal, absent endocervical zone due to atrophy.  LMP - 2010. Menopausal.  Mammogram - 05/2023 - Birads1 @ [place]  DEXA 03/27/15 Osteopenia  DEXA 09/2020 - T -1.8 spine, -2.0 R femur neck - mild progression from last check 2017. Continues 5000 IU vit D nightly. Drinks kefir, eats chees, leafy greens - reviewed goal 1200mg /day  Lung cancer screening - not eligible  Flu shot yearly. COVID vaccine Pfizer 03/2019 x2, booster 11/2019, 08/2020, bivalent 12/2020, 11/2021 Tetanus 2000, 2010.  Pneumovax 09/2011 (felt sick afterwards). Prevnar-13 02/2015, prevnar-20 discussed, to consider  Shingrix - discussed, to check at pharmacy  RSV - 10/2022 Advanced directives: living will scanned into chart 2017. Husband Vinie then son Medford are HCPOA. Does not want prolonged life support if terminally or incurably ill.  Seat belt use discussed Sunscreen use discussed, no changing skin lesions. H/o melanoma 2012, sees Dr Dela derm yearly.  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 Activity: 2.5 mi most days walking Diet: good water, daily fruits/vegetables     Relevant past medical, surgical, family and social history reviewed and updated as indicated. Interim medical history since our last visit reviewed. Allergies and medications reviewed and updated. Outpatient Medications Prior to Visit  Medication Sig Dispense Refill   Berberine Chloride (BERBERINE HCI) 500 MG CAPS      Cholecalciferol (VITAMIN D3) 125 MCG (5000 UT) CAPS Take 1 capsule (5,000 Units total) by mouth every Monday,  Wednesday, and Friday. 30 capsule    Continuous Glucose Sensor (FREESTYLE LIBRE 3 PLUS SENSOR) MISC CHANGE/APPLY SENSOR TO SKIN EVERY 15 DAYS 2 each 6   glucose blood (FREESTYLE LITE) test strip USE TO CHECK SUGAR ONCE DAILY AND AS NEEDED. DX:E11.65 100 each 3   Magnesium  Glycinate (MAGNESIUM  GLYCINATE ADVANCED) 120 MG CAPS Take 1 capsule by mouth daily. Mg Glycinate 350mg  with vitamin B6 100mg      Omega-3 Fatty Acids (FISH OIL ) 1000 MG CAPS Take 2 capsules (2,000 mg total) by mouth daily.  0   OVER THE COUNTER MEDICATION CBD PM 1000 mg with Melatonin as needed for sleep     pantoprazole  (PROTONIX ) 40 MG tablet TAKE 1 TABLET (40 MG TOTAL) BY MOUTH DAILY. PROTONIX  40 MG DAILY FOR 8 WEEKS. 90 tablet 0   TURMERIC CURCUMIN PO Take 750 mg by mouth daily.     ZADITOR 0.025 % ophthalmic solution Apply to eye.     amLODipine  (NORVASC ) 5 MG tablet TAKE 1 TABLET BY MOUTH EVERYDAY AT BEDTIME 90 tablet 4   enalapril  (VASOTEC ) 20 MG tablet Take 1 tablet (20 mg total) by mouth daily. 90 tablet 4   glipiZIDE  (GLUCOTROL ) 5 MG tablet Take 0.5 tablets (2.5 mg total) by mouth 2 (two) times daily before a meal. 90 tablet 3   insulin  glargine, 1 Unit Dial, (TOUJEO  SOLOSTAR) 300 UNIT/ML Solostar Pen Inject 30 Units into the skin daily. 9 mL 1   Insulin  Pen Needle (BD PEN NEEDLE NANO 2ND GEN) 32G X 4 MM MISC USE AS DIRECTED ONCE DAILY 100 each 4   metFORMIN  (GLUCOPHAGE -XR) 500 MG 24 hr tablet Take 1 tablet (500 mg total) by mouth in the morning and at bedtime. 180 tablet 4   Alpha-Lipoic Acid 600 MG CAPS  (Patient not taking: Reported on 09/27/2023)     cyanocobalamin  (VITAMIN B12) 1000 MCG tablet Take 1 tablet (1,000 mcg total) by mouth every Monday, Wednesday, and Friday.     insulin  glargine (LANTUS  SOLOSTAR) 100 UNIT/ML Solostar Pen Inject 30 Units into the skin daily. 30 mL 3   MISC NATURAL PRODUCTS PO Take by mouth. SNS GlycoPhase     No facility-administered medications prior to visit.     Per HPI unless  specifically indicated in ROS section below Review of Systems  Constitutional:  Negative for activity change, appetite change, chills, fatigue, fever and unexpected weight change.  HENT:  Negative for hearing loss.   Eyes:  Negative for visual disturbance.  Respiratory:  Positive for cough (allergy related). Negative for chest tightness, shortness of breath and wheezing.   Cardiovascular:  Negative for chest pain, palpitations and leg swelling.  Gastrointestinal:  Negative for abdominal distention, abdominal pain, blood in stool, constipation, diarrhea, nausea and vomiting.  Genitourinary:  Negative for difficulty urinating and hematuria.  Musculoskeletal:  Negative for arthralgias, myalgias and neck pain.  Skin:  Negative for rash.  Neurological:  Negative for dizziness, seizures, syncope and headaches.  Hematological:  Negative for adenopathy. Does not bruise/bleed easily.  Psychiatric/Behavioral:  Negative for dysphoric mood. The patient is not nervous/anxious.     Objective:  BP 134/84 Comment: at home this morning  Pulse 91   Temp 98.7 F (37.1 C) (Oral)   Ht 5' 8.5 (1.74 m)   Wt 162 lb 6 oz (73.7 kg)   SpO2 97%   BMI 24.33 kg/m   Wt Readings from Last 3 Encounters:  09/27/23 162 lb 6 oz (73.7 kg)  09/13/23 160 lb (72.6 kg)  06/07/23 163 lb (73.9 kg)      Physical Exam Vitals and nursing note reviewed.  Constitutional:      Appearance: Normal appearance. She is not ill-appearing.  HENT:     Head: Normocephalic and atraumatic.     Right Ear: Tympanic membrane, ear canal and external ear normal. There is no impacted cerumen.     Left Ear: Tympanic membrane, ear canal and external ear normal. There is no impacted cerumen.     Mouth/Throat:     Mouth: Mucous membranes are moist.     Pharynx: Oropharynx is clear. No oropharyngeal exudate or posterior oropharyngeal erythema.  Eyes:     General:        Right eye: No discharge.        Left eye: No discharge.      Extraocular Movements: Extraocular movements intact.     Conjunctiva/sclera: Conjunctivae normal.     Pupils: Pupils are equal, round, and reactive to light.  Neck:     Thyroid : No thyroid  mass or thyromegaly.  Cardiovascular:     Rate and Rhythm: Normal rate and regular rhythm.     Pulses: Normal pulses.     Heart sounds: Normal heart sounds. No murmur heard. Pulmonary:     Effort: Pulmonary effort is normal. No respiratory distress.     Breath sounds: Normal breath sounds. No wheezing, rhonchi or rales.  Abdominal:     General: Bowel sounds are normal. There is no distension.     Palpations: Abdomen is soft. There is no mass.     Tenderness: There is no abdominal tenderness. There is no guarding or rebound.     Hernia: No hernia is present.  Musculoskeletal:     Cervical back: Normal range of motion and neck supple. No rigidity.     Right lower leg: No edema.     Left lower leg: No edema.  Lymphadenopathy:     Cervical: No cervical adenopathy.  Skin:    General: Skin is warm and dry.     Findings: No rash.  Neurological:     General: No focal deficit present.     Mental Status: She is alert. Mental status is at baseline.  Psychiatric:        Mood and Affect: Mood normal.        Behavior: Behavior normal.       Results for orders placed or performed in visit on 09/20/23  Vitamin B12   Collection Time: 09/20/23  7:31 AM  Result Value Ref Range   Vitamin B-12 494 211 - 911 pg/mL  VITAMIN D  25 Hydroxy (Vit-D Deficiency, Fractures)   Collection Time: 09/20/23  7:31 AM  Result Value Ref Range   VITD 61.67 30.00 - 100.00 ng/mL  CBC with Differential/Platelet   Collection Time: 09/20/23  7:31 AM  Result Value Ref Range   WBC 6.1 4.0 - 10.5 K/uL  RBC 4.65 3.87 - 5.11 Mil/uL   Hemoglobin 13.5 12.0 - 15.0 g/dL   HCT 59.4 63.9 - 53.9 %   MCV 87.1 78.0 - 100.0 fl   MCHC 33.2 30.0 - 36.0 g/dL   RDW 85.9 88.4 - 84.4 %   Platelets 174.0 150.0 - 400.0 K/uL   Neutrophils  Relative % 60.5 43.0 - 77.0 %   Lymphocytes Relative 26.5 12.0 - 46.0 %   Monocytes Relative 8.4 3.0 - 12.0 %   Eosinophils Relative 3.4 0.0 - 5.0 %   Basophils Relative 1.2 0.0 - 3.0 %   Neutro Abs 3.7 1.4 - 7.7 K/uL   Lymphs Abs 1.6 0.7 - 4.0 K/uL   Monocytes Absolute 0.5 0.1 - 1.0 K/uL   Eosinophils Absolute 0.2 0.0 - 0.7 K/uL   Basophils Absolute 0.1 0.0 - 0.1 K/uL  T4, free   Collection Time: 09/20/23  7:31 AM  Result Value Ref Range   Free T4 0.99 0.60 - 1.60 ng/dL  TSH   Collection Time: 09/20/23  7:31 AM  Result Value Ref Range   TSH 0.67 0.35 - 5.50 uIU/mL  Comprehensive metabolic panel with GFR   Collection Time: 09/20/23  7:31 AM  Result Value Ref Range   Sodium 140 135 - 145 mEq/L   Potassium 4.1 3.5 - 5.1 mEq/L   Chloride 103 96 - 112 mEq/L   CO2 29 19 - 32 mEq/L   Glucose, Bld 98 70 - 99 mg/dL   BUN 15 6 - 23 mg/dL   Creatinine, Ser 9.40 0.40 - 1.20 mg/dL   Total Bilirubin 0.6 0.2 - 1.2 mg/dL   Alkaline Phosphatase 56 39 - 117 U/L   AST 13 0 - 37 U/L   ALT 12 0 - 35 U/L   Total Protein 6.2 6.0 - 8.3 g/dL   Albumin 4.3 3.5 - 5.2 g/dL   GFR 10.75 >39.99 mL/min   Calcium  9.0 8.4 - 10.5 mg/dL  Lipid panel   Collection Time: 09/20/23  7:31 AM  Result Value Ref Range   Cholesterol 178 0 - 200 mg/dL   Triglycerides 08.9 0.0 - 149.0 mg/dL   HDL 51.39 >60.99 mg/dL   VLDL 81.7 0.0 - 59.9 mg/dL   LDL Cholesterol 888 (H) 0 - 99 mg/dL   Total CHOL/HDL Ratio 4    NonHDL 129.59   Microalbumin / creatinine urine ratio   Collection Time: 09/20/23  7:31 AM  Result Value Ref Range   Microalb, Ur <0.7 mg/dL   Creatinine,U 79.8 mg/dL   Microalb Creat Ratio Unable to calculate 0.0 - 30.0 mg/g  Hemoglobin A1c   Collection Time: 09/20/23  7:31 AM  Result Value Ref Range   Hgb A1c MFr Bld 7.3 (H) 4.6 - 6.5 %    Assessment & Plan:   Problem List Items Addressed This Visit     Healthcare maintenance - Primary (Chronic)   Preventative protocols reviewed and updated  unless pt declined. Discussed healthy diet and lifestyle.       Advanced care planning/counseling discussion (Chronic)   Previously discussed      Type 2 diabetes mellitus with other specified complication (HCC)   Chronic, adequate on current regimen - continue. Dropping glipizide  to 1/2 tab in am.       Relevant Medications   enalapril  (VASOTEC ) 20 MG tablet   insulin  glargine, 1 Unit Dial, (TOUJEO  SOLOSTAR) 300 UNIT/ML Solostar Pen   metFORMIN  (GLUCOPHAGE -XR) 500 MG 24 hr tablet   glipiZIDE  (GLUCOTROL ) 5  MG tablet   Essential hypertension, benign   Chronic deteriorated however at home running ok.  Continue current regimen, I did discuss starting to check more frequently and notify me if >140/90 for med titration.       Relevant Medications   amLODipine  (NORVASC ) 5 MG tablet   enalapril  (VASOTEC ) 20 MG tablet   History of malignant melanoma   Continue regular derm f/u      Hyperlipidemia associated with type 2 diabetes mellitus (HCC)   Chronic overall improved. Statin intolerance. Desires to avoid chol medication at this time. Reviewed LDL goal <100 in diabetic. The 10-year ASCVD risk score (Arnett DK, et al., 2019) is: 32.7%   Values used to calculate the score:     Age: 67 years     Clincally relevant sex: Female     Is Non-Hispanic African American: No     Diabetic: Yes     Tobacco smoker: No     Systolic Blood Pressure: 134 mmHg     Is BP treated: Yes     HDL Cholesterol: 48.6 mg/dL     Total Cholesterol: 178 mg/dL       Relevant Medications   amLODipine  (NORVASC ) 5 MG tablet   enalapril  (VASOTEC ) 20 MG tablet   insulin  glargine, 1 Unit Dial, (TOUJEO  SOLOSTAR) 300 UNIT/ML Solostar Pen   metFORMIN  (GLUCOPHAGE -XR) 500 MG 24 hr tablet   glipiZIDE  (GLUCOTROL ) 5 MG tablet   Abnormal thyroid  function test   TFTs remain stable       Osteopenia   Stable on latest dexa, rpt 2027      Myalgia due to statin   Stress incontinence   mild      Cyst and pseudocyst  of pancreas   Regularly sees GI appreciate their care, planned rpt MRI 2026.      Type 2 diabetes mellitus with diabetic neuropathy, unspecified (HCC)   ALA may have caused stomach irritation now off this       Relevant Medications   enalapril  (VASOTEC ) 20 MG tablet   insulin  glargine, 1 Unit Dial, (TOUJEO  SOLOSTAR) 300 UNIT/ML Solostar Pen   metFORMIN  (GLUCOPHAGE -XR) 500 MG 24 hr tablet   glipiZIDE  (GLUCOTROL ) 5 MG tablet   Other Visit Diagnoses       Need for pneumococcal vaccination       Relevant Orders   Pneumococcal conjugate vaccine 20-valent (Completed)        Meds ordered this encounter  Medications   amLODipine  (NORVASC ) 5 MG tablet    Sig: TAKE 1 TABLET BY MOUTH EVERYDAY AT BEDTIME    Dispense:  90 tablet    Refill:  4   enalapril  (VASOTEC ) 20 MG tablet    Sig: Take 1 tablet (20 mg total) by mouth daily.    Dispense:  90 tablet    Refill:  4   insulin  glargine, 1 Unit Dial, (TOUJEO  SOLOSTAR) 300 UNIT/ML Solostar Pen    Sig: Inject 35 Units into the skin daily.    Dispense:  15 mL    Refill:  3   Insulin  Pen Needle (BD PEN NEEDLE NANO 2ND GEN) 32G X 4 MM MISC    Sig: USE AS DIRECTED ONCE DAILY    Dispense:  100 each    Refill:  4   metFORMIN  (GLUCOPHAGE -XR) 500 MG 24 hr tablet    Sig: Take 1 tablet (500 mg total) by mouth in the morning and at bedtime.    Dispense:  180 tablet    Refill:  4   glipiZIDE  (GLUCOTROL ) 5 MG tablet    Sig: Take 0.5 tablets (2.5 mg total) by mouth daily before breakfast.    Dispense:  45 tablet    Refill:  1    Note new dose   mupirocin  cream (BACTROBAN ) 2 %    Sig: Apply 1 Application topically 2 (two) times daily. Formulate cream/ointment based on cost/insurance coverage    Dispense:  30 g    Refill:  0    Orders Placed This Encounter  Procedures   Pneumococcal conjugate vaccine 20-valent    Patient Instructions  Consider latest pneumonia shot. If interested, check with pharmacy about new 2 shot shingles series  (shingrix).  BP was elevated in office today however home readings are running ok. Start monitoring at home more closely, and let me know if consistently >140/90.  Good to see you today  Return as needed or in 6 months for follow up visit   Follow up plan: Return in about 6 months (around 03/29/2024) for follow up visit.  Anton Blas, MD

## 2023-09-27 NOTE — Assessment & Plan Note (Signed)
TFTs remain stable. 

## 2023-09-27 NOTE — Assessment & Plan Note (Signed)
 mild

## 2023-09-27 NOTE — Assessment & Plan Note (Signed)
 Previously discussed.

## 2023-09-27 NOTE — Assessment & Plan Note (Signed)
 Regularly sees GI appreciate their care, planned rpt MRI 2026.

## 2023-09-27 NOTE — Assessment & Plan Note (Signed)
 Chronic overall improved. Statin intolerance. Desires to avoid chol medication at this time. Reviewed LDL goal <100 in diabetic. The 10-year ASCVD risk score (Arnett DK, et al., 2019) is: 32.7%   Values used to calculate the score:     Age: 73 years     Clincally relevant sex: Female     Is Non-Hispanic African American: No     Diabetic: Yes     Tobacco smoker: No     Systolic Blood Pressure: 134 mmHg     Is BP treated: Yes     HDL Cholesterol: 48.6 mg/dL     Total Cholesterol: 178 mg/dL

## 2023-09-27 NOTE — Assessment & Plan Note (Addendum)
Continue regular derm f/u.

## 2023-09-27 NOTE — Assessment & Plan Note (Signed)
 Chronic, adequate on current regimen - continue. Dropping glipizide  to 1/2 tab in am.

## 2023-09-27 NOTE — Assessment & Plan Note (Signed)
 Stable on latest dexa, rpt 2027

## 2023-09-27 NOTE — Assessment & Plan Note (Signed)
 Chronic deteriorated however at home running ok.  Continue current regimen, I did discuss starting to check more frequently and notify me if >140/90 for med titration.

## 2023-09-27 NOTE — Assessment & Plan Note (Signed)
 ALA may have caused stomach irritation now off this

## 2023-09-27 NOTE — Assessment & Plan Note (Signed)
 Preventative protocols reviewed and updated unless pt declined. Discussed healthy diet and lifestyle.

## 2023-09-27 NOTE — Patient Instructions (Addendum)
 Consider latest pneumonia shot. If interested, check with pharmacy about new 2 shot shingles series (shingrix).  BP was elevated in office today however home readings are running ok. Start monitoring at home more closely, and let me know if consistently >140/90.  Good to see you today  Return as needed or in 6 months for follow up visit

## 2023-11-22 ENCOUNTER — Encounter: Payer: Self-pay | Admitting: Family Medicine

## 2024-01-19 ENCOUNTER — Other Ambulatory Visit: Payer: Self-pay | Admitting: Family Medicine

## 2024-01-20 LAB — OPHTHALMOLOGY REPORT-SCANNED

## 2024-01-31 ENCOUNTER — Encounter: Payer: Self-pay | Admitting: Family Medicine

## 2024-02-04 MED ORDER — TOUJEO SOLOSTAR 300 UNIT/ML ~~LOC~~ SOPN: 35.0000 [IU] | PEN_INJECTOR | Freq: Every day | SUBCUTANEOUS | 1 refills | Status: AC

## 2024-02-23 ENCOUNTER — Other Ambulatory Visit: Payer: Self-pay | Admitting: Family Medicine

## 2024-02-23 DIAGNOSIS — E1169 Type 2 diabetes mellitus with other specified complication: Secondary | ICD-10-CM

## 2024-03-29 ENCOUNTER — Ambulatory Visit: Admitting: Family Medicine

## 2024-09-14 ENCOUNTER — Ambulatory Visit

## 2024-09-15 ENCOUNTER — Ambulatory Visit

## 2024-09-26 ENCOUNTER — Other Ambulatory Visit

## 2024-10-02 ENCOUNTER — Encounter: Admitting: Family Medicine

## 2024-10-03 ENCOUNTER — Encounter: Admitting: Family Medicine
